# Patient Record
Sex: Female | Born: 1945 | Race: White | Hispanic: No | Smoking: Former smoker
Health system: Southern US, Community
[De-identification: ages and names within clinical notes are randomized; demographics above are authoritative.]

## PROBLEM LIST (undated history)

## (undated) DIAGNOSIS — I10 Essential (primary) hypertension: Secondary | ICD-10-CM

## (undated) DIAGNOSIS — E039 Hypothyroidism, unspecified: Secondary | ICD-10-CM

## (undated) DIAGNOSIS — M545 Low back pain, unspecified: Secondary | ICD-10-CM

## (undated) DIAGNOSIS — J189 Pneumonia, unspecified organism: Secondary | ICD-10-CM

## (undated) DIAGNOSIS — I209 Angina pectoris, unspecified: Secondary | ICD-10-CM

## (undated) DIAGNOSIS — K219 Gastro-esophageal reflux disease without esophagitis: Secondary | ICD-10-CM

## (undated) DIAGNOSIS — J449 Chronic obstructive pulmonary disease, unspecified: Secondary | ICD-10-CM

## (undated) DIAGNOSIS — G629 Polyneuropathy, unspecified: Secondary | ICD-10-CM

## (undated) DIAGNOSIS — M1990 Unspecified osteoarthritis, unspecified site: Secondary | ICD-10-CM

## (undated) DIAGNOSIS — I509 Heart failure, unspecified: Secondary | ICD-10-CM

## (undated) DIAGNOSIS — T7840XA Allergy, unspecified, initial encounter: Secondary | ICD-10-CM

## (undated) DIAGNOSIS — D649 Anemia, unspecified: Secondary | ICD-10-CM

## (undated) DIAGNOSIS — I447 Left bundle-branch block, unspecified: Secondary | ICD-10-CM

## (undated) DIAGNOSIS — R519 Headache, unspecified: Secondary | ICD-10-CM

## (undated) DIAGNOSIS — N200 Calculus of kidney: Secondary | ICD-10-CM

## (undated) DIAGNOSIS — M6282 Rhabdomyolysis: Secondary | ICD-10-CM

## (undated) DIAGNOSIS — E785 Hyperlipidemia, unspecified: Secondary | ICD-10-CM

## (undated) DIAGNOSIS — I219 Acute myocardial infarction, unspecified: Secondary | ICD-10-CM

## (undated) DIAGNOSIS — Z87442 Personal history of urinary calculi: Secondary | ICD-10-CM

## (undated) HISTORY — PX: TUBAL LIGATION: SHX77

## (undated) HISTORY — PX: CHOLECYSTECTOMY: SHX55

## (undated) HISTORY — DX: Hyperlipidemia, unspecified: E78.5

## (undated) HISTORY — DX: Chronic obstructive pulmonary disease, unspecified: J44.9

## (undated) HISTORY — PX: COLONOSCOPY: SHX174

## (undated) HISTORY — DX: Low back pain: M54.5

## (undated) HISTORY — PX: ABDOMINAL HYSTERECTOMY: SHX81

## (undated) HISTORY — PX: POLYPECTOMY: SHX149

## (undated) HISTORY — DX: Essential (primary) hypertension: I10

## (undated) HISTORY — DX: Polyneuropathy, unspecified: G62.9

## (undated) HISTORY — DX: Allergy, unspecified, initial encounter: T78.40XA

## (undated) HISTORY — DX: Low back pain, unspecified: M54.50

---

## 2003-04-02 ENCOUNTER — Ambulatory Visit (HOSPITAL_COMMUNITY): Admission: RE | Admit: 2003-04-02 | Discharge: 2003-04-02 | Payer: Self-pay | Admitting: Orthopaedic Surgery

## 2004-02-24 ENCOUNTER — Ambulatory Visit: Payer: Self-pay | Admitting: Orthopedic Surgery

## 2004-09-11 ENCOUNTER — Ambulatory Visit: Payer: Self-pay | Admitting: Physician Assistant

## 2004-09-20 ENCOUNTER — Ambulatory Visit: Payer: Self-pay | Admitting: Unknown Physician Specialty

## 2005-11-14 ENCOUNTER — Ambulatory Visit: Payer: Self-pay | Admitting: Internal Medicine

## 2006-02-12 ENCOUNTER — Encounter: Admission: RE | Admit: 2006-02-12 | Discharge: 2006-03-27 | Payer: Self-pay | Admitting: Neurology

## 2006-03-12 ENCOUNTER — Encounter: Payer: Self-pay | Admitting: Internal Medicine

## 2006-03-12 DIAGNOSIS — E118 Type 2 diabetes mellitus with unspecified complications: Secondary | ICD-10-CM

## 2006-03-12 DIAGNOSIS — M545 Low back pain: Secondary | ICD-10-CM

## 2006-03-12 DIAGNOSIS — M109 Gout, unspecified: Secondary | ICD-10-CM | POA: Insufficient documentation

## 2006-03-12 DIAGNOSIS — G609 Hereditary and idiopathic neuropathy, unspecified: Secondary | ICD-10-CM

## 2006-03-12 DIAGNOSIS — I10 Essential (primary) hypertension: Secondary | ICD-10-CM

## 2006-03-12 DIAGNOSIS — E785 Hyperlipidemia, unspecified: Secondary | ICD-10-CM

## 2006-03-12 DIAGNOSIS — J309 Allergic rhinitis, unspecified: Secondary | ICD-10-CM

## 2009-11-10 ENCOUNTER — Ambulatory Visit: Payer: Self-pay | Admitting: Family Medicine

## 2009-11-10 DIAGNOSIS — E039 Hypothyroidism, unspecified: Secondary | ICD-10-CM | POA: Insufficient documentation

## 2009-11-10 DIAGNOSIS — J449 Chronic obstructive pulmonary disease, unspecified: Secondary | ICD-10-CM

## 2009-11-10 LAB — HM MAMMOGRAPHY

## 2009-11-10 LAB — HM COLONOSCOPY

## 2009-11-14 ENCOUNTER — Ambulatory Visit: Payer: Self-pay | Admitting: General Practice

## 2009-11-15 ENCOUNTER — Ambulatory Visit: Payer: Self-pay | Admitting: Family Medicine

## 2009-11-15 DIAGNOSIS — J019 Acute sinusitis, unspecified: Secondary | ICD-10-CM | POA: Insufficient documentation

## 2009-11-16 ENCOUNTER — Encounter: Payer: Self-pay | Admitting: Family Medicine

## 2009-11-16 LAB — CONVERTED CEMR LAB
CO2: 26 meq/L (ref 19–32)
Calcium: 9.3 mg/dL (ref 8.4–10.5)
Cholesterol: 211 mg/dL — ABNORMAL HIGH (ref 0–200)
Creatinine, Ser: 0.9 mg/dL (ref 0.4–1.2)
HDL: 46.9 mg/dL (ref >39.00)
TSH: 0.47 microintl units/mL (ref 0.35–5.50)
Total CHOL/HDL Ratio: 4
Triglycerides: 215 mg/dL — ABNORMAL HIGH (ref 0.0–149.0)
Uric Acid, Serum: 5.3 mg/dL (ref 2.4–7.0)
VLDL: 43 mg/dL — ABNORMAL HIGH (ref 0.0–40.0)

## 2009-12-02 ENCOUNTER — Telehealth: Payer: Self-pay | Admitting: Family Medicine

## 2009-12-08 ENCOUNTER — Ambulatory Visit: Payer: Self-pay | Admitting: Family Medicine

## 2009-12-08 DIAGNOSIS — J029 Acute pharyngitis, unspecified: Secondary | ICD-10-CM | POA: Insufficient documentation

## 2009-12-08 DIAGNOSIS — F4321 Adjustment disorder with depressed mood: Secondary | ICD-10-CM | POA: Insufficient documentation

## 2009-12-12 ENCOUNTER — Telehealth: Payer: Self-pay | Admitting: Family Medicine

## 2009-12-15 ENCOUNTER — Ambulatory Visit: Payer: Self-pay | Admitting: Family Medicine

## 2009-12-15 DIAGNOSIS — R300 Dysuria: Secondary | ICD-10-CM | POA: Insufficient documentation

## 2009-12-15 LAB — CONVERTED CEMR LAB
Blood in Urine, dipstick: NEGATIVE
Glucose, Urine, Semiquant: NEGATIVE
WBC Urine, dipstick: NEGATIVE

## 2009-12-26 ENCOUNTER — Encounter: Payer: Self-pay | Admitting: Family Medicine

## 2009-12-26 ENCOUNTER — Encounter: Payer: Self-pay | Admitting: Cardiovascular Disease

## 2009-12-26 ENCOUNTER — Ambulatory Visit: Payer: Self-pay | Admitting: Family Medicine

## 2009-12-26 DIAGNOSIS — K219 Gastro-esophageal reflux disease without esophagitis: Secondary | ICD-10-CM | POA: Insufficient documentation

## 2009-12-26 DIAGNOSIS — I447 Left bundle-branch block, unspecified: Secondary | ICD-10-CM

## 2010-01-02 ENCOUNTER — Telehealth: Payer: Self-pay | Admitting: Family Medicine

## 2010-01-11 ENCOUNTER — Ambulatory Visit: Payer: Self-pay | Admitting: Cardiovascular Disease

## 2010-01-24 ENCOUNTER — Encounter: Payer: Self-pay | Admitting: Cardiovascular Disease

## 2010-01-24 ENCOUNTER — Ambulatory Visit: Payer: Self-pay

## 2010-02-02 ENCOUNTER — Telehealth: Payer: Self-pay | Admitting: Family Medicine

## 2010-02-08 ENCOUNTER — Encounter: Payer: Self-pay | Admitting: Family Medicine

## 2010-02-08 ENCOUNTER — Ambulatory Visit
Admission: RE | Admit: 2010-02-08 | Discharge: 2010-02-08 | Payer: Self-pay | Source: Home / Self Care | Attending: Family Medicine | Admitting: Family Medicine

## 2010-02-09 ENCOUNTER — Ambulatory Visit
Admission: RE | Admit: 2010-02-09 | Discharge: 2010-02-09 | Payer: Self-pay | Source: Home / Self Care | Attending: Family Medicine | Admitting: Family Medicine

## 2010-02-09 DIAGNOSIS — I252 Old myocardial infarction: Secondary | ICD-10-CM | POA: Insufficient documentation

## 2010-02-27 ENCOUNTER — Telehealth: Payer: Self-pay | Admitting: Family Medicine

## 2010-02-28 ENCOUNTER — Telehealth: Payer: Self-pay | Admitting: Family Medicine

## 2010-03-07 ENCOUNTER — Telehealth: Payer: Self-pay | Admitting: Family Medicine
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
## 2010-03-07 NOTE — Progress Notes (Signed)
Summary: refill requests from [facility]  Phone Note Refill Request Message from:  Fax from Pharmacy  Refills Requested: Medication #1:  PATADAY 0.2 % SOLN one drop into each eye daily   Last Refilled: 11/01/2009  Medication #2:  TOPAMAX 50 MG TABS two times a day   Last Refilled: 12/01/2009  Medication #3:  HYDROCODONE-ACETAMINOPHEN 5-500 MG TABS 1 tab by mouth every 6 hours as needed for pain   Last Refilled: 11/10/2009 Faxed requests from [facility].  Pt is asking for a new script for vicodin to be sent in.  Wants less tylenol.  Initial call taken by: [name] CMA, AAMA,  December 02, 2009 9:26 AM    New/Updated Medications: HYDROCODONE-ACETAMINOPHEN 5-325 MG TABS (HYDROCODONE-ACETAMINOPHEN) 1-2 tab by mouth every 6 hours as needed for pain. Prescriptions: TOPAMAX 50 MG TABS (TOPIRAMATE) two times a day  #60 x 3   Entered and Authorized by:   [name] MD   Signed by:   [name] MD on 12/02/2009   Method used:   Electronically to        [facility]* (retail)       [address]       [place], [place]  [address]       Ph: [phone]       Fax: [phone]   RxID:   [id number] PATADAY 0.2 % SOLN (OLOPATADINE HCL) one drop into each eye daily  #1 x 3   Entered and Authorized by:   [name] MD   Signed by:   [name] MD on 12/02/2009   Method used:   Electronically to        [facility]* (retail)       [address]       [place], [place]  [address]       Ph: [phone]       Fax: [phone]   RxID:   [id number]

## 2010-03-07 NOTE — Letter (Signed)
Summary: Controlled Substances Contract  [place] at [facility]  [address] [place], [place] [address]   Phone: [phone]  Fax: [phone]    [facility] Controlled Substances Contract         Patient Name: Mariah Reed Patient DOB: 04-04-1945        Patient MRN:  [id number]        Physician's Name: _________________________________________   Patients must complete this contract before doctors at the [facility] office will be willing to prescribe controlled substances. I understand that: ___1)  I am responsible for my controlled substance medications.  If my prescription is lost, misplaced or stolen, or if I take more than prescribed, my doctor will not write me a new prescription. ___2)  I will not request or accept controlled substances or controlled substance prescriptions from any other doctor or clinic while I am receiving controlled substance treatment at [facility].  The ONLY exception is if controlled substances are prescribed for the treatment of an acute condition that is NOT the diagnosis for which I am receiving treatment at [facility].   I will call my physician at [facility] if I receive controlled substance or controlled substance prescriptions from anywhere else. ___3)  Controlled substance refills will be made ONLY during regular office hours. ___4)  Refills will not be made if I run out early.  Refills will not be made during work-in or urgent care visits.  Refills will NOT be made for "emergencies", such as on a Friday afternoon or by on call service at night or weekends.  I understand that I am required to call at least 2 business days prior to expiration date for controlled substance and/or needing controlled substance refills.   ___5)  I will not use illicit (illegal) drugs.  ___6)  I agree to take urine or blood drug tests when requested for routine screening. ___7)  I agree to use only ONE pharmacy for  filling ALL my controlled substance prescriptions.             Name and Location of Pharmacy:                                                                                                                  ___8)  I understand that my doctor may review my use of controlled substances using the [facility] Controlled Substance Reporting System. ___9)  If I behave in an abusive way towards [facility] staff, my controlled substance prescriptions may be stopped, and I may be dismissed from this practice. ___10)  I understand that if I break any of the above terms of this contract, my pain prescription and/or treatment may be stopped immediately.  If I get controlled substances from someone else or use illegal drugs, I may be reported to all my doctors, medical facilities and appropriate authorities.  I have been fully informed by [facility] physicians and the staff regarding psychological dependence (addiction)  to controlled substances.  I understand that I should stop my medication ONLY under medical supervision or I may have withdrawal symptoms.  ***I have read this contract and it has been explained to me by [facility] physicians and/or their staff, and I fully understand the consequences of violating any of the terms of this contract.    Patient Signature _________________________________________ Date November 10, 2009   [facility] Staff Signature ____________________________________ Date November 10, 2009

## 2010-03-07 NOTE — Letter (Signed)
Summary: Generic Letter  [place] at [facility]  [address] [place], [place] [address]   Phone: [phone]  Fax: [phone]    11/16/2009  [facility] [address] [place], [place]  [address]  Dear Mariah Reed,    We have been unable to reach you by telephone regarding your lab results.  Please call our office at your convenience to discuss these results.  When calling please ask to speak with Lowella Bandy, Medical Assistant for Dr. [name].       Sincerely,         Linde Gillis CMA (AAMA)for Dr. [name]

## 2010-03-07 NOTE — Progress Notes (Signed)
Summary: refill request for lyrica  Phone Note Refill Request Message from:  Fax from Pharmacy  Refills Requested: Medication #1:  LYRICA 100 MG CAPS three times a day   Last Refilled: 12/01/2009 Faxed request from [facility].  Initial call taken by: [name] CMA, AAMA,  December 02, 2009 2:14 PM

## 2010-03-07 NOTE — Miscellaneous (Signed)
Summary: Controlled Substances Contract  Controlled Substances Contract   Imported By: Maryln Gottron 11/25/2009 13:56:48  _____________________________________________________________________  External Attachment:    Type:   Image     Comment:   External Document

## 2010-03-07 NOTE — Assessment & Plan Note (Signed)
Summary: sinus infection/alc   Vital Signs:  Patient profile:   65 year old female Weight:      220 pounds Temp:     98.3 degrees F oral Pulse rate:   88 / minute Pulse rhythm:   regular BP sitting:   124 / 80  (right arm) Cuff size:   large  Vitals Entered By: Selena Batten Dance CMA Duncan Dull) (November 15, 2009 11:26 AM) CC: Sinus infection   CC:  Sinus infection.  History of Present Illness: CC: sinus infection  6d h/o sinus congestion and pressure/headache, coughing, + purulent drainage.  has actually had several weeks of sinus issues, but initially attributed to allergies (pt with h/o bad allergies).  No ear pain, tooth pain.  No fevers.  No abd pain, n/v/d.  no sick contacts.  no smoking currently.  h/o COPD.  No increase in SOB.  No vision changes  recent kidney infection, h/o kidney stones - completed course of cipro x 10 days per Dr. Glenis Smoker.  Saw Dr. Corbin Ade (sp?) [facility] yesterday for above, told urine clear.    While on cipro HA and sinus pain improved, then when finished course HA returning so had old amoxicillin at home, took 1 pill which resolved headache.  + h/o sinus infections in past.  Current Medications (verified): 1)  Lyrica 100 Mg Caps (Pregabalin) .... Three Times A Day 2)  Ambien 10 Mg Tabs (Zolpidem Tartrate) .... At Bedtime 3)  Prilosec 20 Mg Cpdr (Omeprazole) .... Once Daily 4)  Topamax 50 Mg Tabs (Topiramate) .... Two Times A Day 5)  Lasix 20 Mg Tabs (Furosemide) .... Once Daily 6)  Premarin 0.3 Mg Tabs (Estrogens Conjugated) .... Once Daily 7)  Hydrocodone-Acetaminophen 5-500 Mg Tabs (Hydrocodone-Acetaminophen) .Marland Kitchen.. 1 Tab By Mouth Every 6 Hours As Needed For Pain 8)  Prodigy Twist Top Lancets 28g  Misc (Lancets) .... Use One Lancet Each Day As Directed 9)  Voltaren 1 % Gel (Diclofenac Sodium) .... Apply Four Grams Four Times Daily As Directed 10)  Proair Hfa 108 (90 Base) Mcg/act Aers (Albuterol Sulfate) .... Inhale Two Puffs Four Times Daily As  Directed 11)  Spiriva Handihaler 18 Mcg Caps (Tiotropium Bromide Monohydrate) .... Inhale One Capsule With Inhalation Device Each Day 12)  Lovastatin 40 Mg Tabs (Lovastatin) .... Take One Tablet By Mouth At Bedtime 13)  Glipizide 5 Mg Tabs (Glipizide) .... Take One Tablet By Mouth in The Morning and Then 1/2 Tablet By Mouth in The Afternoon Each Day 14)  Levothyroxine Sodium 75 Mcg Tabs (Levothyroxine Sodium) .... Take One Tablet By Mouth Daily 15)  Fluticasone Propionate 50 Mcg/act Susp (Fluticasone Propionate) .... Inhale Two Sprays Each Nostril Once Daily 16)  Prodigy No Coding Blood Gluc  Strp (Glucose Blood) .... Use To Test Blood Sugar Three Times Daily 17)  Quinapril Hcl 20 Mg Tabs (Quinapril Hcl) .... Take One Tablet By Mouth Daily 18)  Nortriptyline Hcl 50 Mg Caps (Nortriptyline Hcl) .... Take One Tablet By Mouth Every Night At Bedtime 19)  Pataday 0.2 % Soln (Olopatadine Hcl) .... One Drop Into Each Eye Daily  Allergies (verified): No Known Drug Allergies  Past History:  Social History: Last updated: 11/10/2009 Married to [employer]. Disabled.  Past Medical History: Allergic rhinitis Diabetes mellitus, type II Gout Hyperlipidemia Hypertension Low back pain Peripheral neuropathy COPD  Review of Systems       per HPI  Physical Exam  General:  alert and overweight-appearing, no acute distress.   Head:  normocephalic, atraumatic,  and no abnormalities observed.  mild discomfort to palpation R frontal and maxillary sinuses.  no temporal tenderness Eyes:  vision grossly intact, pupils equal, pupils round, and pupils reactive to light.  + puffy lower eyelids Ears:  R ear normal and L ear normal.   Nose:  no external deformity.  dry turbinates Mouth:  pharynx pink and moist.  MMM Neck:  No deformities, masses, or tenderness noted. Lungs:  Normal respiratory effort, chest expands symmetrically. Lungs are clear to auscultation, no crackles or wheezes. Heart:  Normal rate  and regular rhythm. S1 and S2 normal without gallop, murmur, click, rub or other extra sounds.   Impression & Recommendations:  Problem # 1:  SINUSITIS, ACUTE (ICD-461.9) given going on for sevral weeks, treat as sinusitis with curse of augmentin.  complicated given recent abx use (cipro, one pill of amoxicillin).  red flags to return discussed.  supportive care as in pt instructions.  The following medications were removed from the medication list:    Cipro 500 Mg Tabs (Ciprofloxacin hcl) .Marland Kitchen... Take one tablet by mouth two times a day Her updated medication list for this problem includes:    Fluticasone Propionate 50 Mcg/act Susp (Fluticasone propionate) ..... Inhale two sprays each nostril once daily    Augmentin 875-125 Mg Tabs (Amoxicillin-pot clavulanate) .Marland Kitchen... Twice daily for 10 days  Complete Medication List: 1)  Lyrica 100 Mg Caps (Pregabalin) .... Three times a day 2)  Ambien 10 Mg Tabs (Zolpidem tartrate) .... At bedtime 3)  Prilosec 20 Mg Cpdr (Omeprazole) .... Once daily 4)  Topamax 50 Mg Tabs (Topiramate) .... Two times a day 5)  Lasix 20 Mg Tabs (Furosemide) .... Once daily 6)  Premarin 0.3 Mg Tabs (Estrogens conjugated) .... Once daily 7)  Hydrocodone-acetaminophen 5-500 Mg Tabs (Hydrocodone-acetaminophen) .Marland Kitchen.. 1 tab by mouth every 6 hours as needed for pain 8)  Prodigy Twist Top Lancets 28g Misc (Lancets) .... Use one lancet each day as directed 9)  Voltaren 1 % Gel (Diclofenac sodium) .... Apply four grams four times daily as directed 10)  Proair Hfa 108 (90 Base) Mcg/act Aers (Albuterol sulfate) .... Inhale two puffs four times daily as directed 11)  Spiriva Handihaler 18 Mcg Caps (Tiotropium bromide monohydrate) .... Inhale one capsule with inhalation device each day 12)  Lovastatin 40 Mg Tabs (Lovastatin) .... Take one tablet by mouth at bedtime 13)  Glipizide 5 Mg Tabs (Glipizide) .... Take one tablet by mouth in the morning and then 1/2 tablet by mouth in the  afternoon each day 14)  Levothyroxine Sodium 75 Mcg Tabs (Levothyroxine sodium) .... Take one tablet by mouth daily 15)  Fluticasone Propionate 50 Mcg/act Susp (Fluticasone propionate) .... Inhale two sprays each nostril once daily 16)  Prodigy No Coding Blood Gluc Strp (Glucose blood) .... Use to test blood sugar three times daily 17)  Quinapril Hcl 20 Mg Tabs (Quinapril hcl) .... Take one tablet by mouth daily 18)  Nortriptyline Hcl 50 Mg Caps (Nortriptyline hcl) .... Take one tablet by mouth every night at bedtime 19)  Pataday 0.2 % Soln (Olopatadine hcl) .... One drop into each eye daily 20)  Augmentin 875-125 Mg Tabs (Amoxicillin-pot clavulanate) .... Twice daily for 10 days  Patient Instructions: 1)  You have a sinus infection. 2)  Take medicines as prescribed:  augmentin twice daily for 10 days. 3)  Take guaifenesin 400mg  IR 1 1/2 pills in am and at noon with plenty of fluid to help mobilize mucous. 4)  Use nasal  saline spray or neti pot to help drainage of sinuses. 5)  If you start having fevers >101.5, trouble swallowing or breathing, or are worsening instead of improving as expected, you may need to be seen again. 6)  Good to see you today, call clinic with questions.  Prescriptions: AUGMENTIN 875-125 MG TABS (AMOXICILLIN-POT CLAVULANATE) twice daily for 10 days  #20 x 0   Entered and Authorized by:   Eustaquio Boyden  MD   Signed by:   Eustaquio Boyden  MD on 11/15/2009   Method used:   Electronically to        [facility]* (retail)       [address]       [place], [place]  [address]       Ph: [phone]       Fax: [phone]   RxID:   [id number]   Current Allergies (reviewed today): No known allergies

## 2010-03-07 NOTE — Progress Notes (Signed)
Summary: refill request for lyrica  Phone Note Refill Request Message from:  Fax from Pharmacy  Refills Requested: Medication #1:  LYRICA 100 MG CAPS three times a day   Last Refilled: 12/01/2009 Faxed request from [facility].  Initial call taken by: [name] CMA, AAMA,  December 02, 2009 2:14 PM  Follow-up for Phone Call        Rx called to pharmacy Follow-up by: Linde Gillis CMA Duncan Dull),  December 02, 2009 3:00 PM    Prescriptions: LYRICA 100 MG CAPS (PREGABALIN) three times a day  #90 x 3   Entered and Authorized by:   [name] MD   Signed by:   [name] MD on 12/02/2009   Method used:   Telephoned to ...       [facility]* (retail)       [address]       [place], [place]  [address]       Ph: [phone]       Fax: [phone]   RxID:   [id number]

## 2010-03-07 NOTE — Assessment & Plan Note (Signed)
Summary: SORE THROAT   Vital Signs:  Patient profile:   65 year old female Height:      66 inches Weight:      218 pounds BMI:     35.31 Temp:     98.3 degrees F oral Pulse rate:   84 / minute Pulse rhythm:   regular BP sitting:   126 / 82  (left arm) Cuff size:   large  Vitals Entered By: Linde Gillis CMA Duncan Dull) (December 08, 2009 10:54 AM) CC: sore throat   History of Present Illness: 65 yo here for sore throat but wants to discuss multiple medical problems.    1.  Sore throat- started 3 days ago.  Treated with Augmentin on 10/11.  Denies any feves or chills.  Her father died recently and was around a lot of people that had colds. Allegra D helps a little bit.   No runny nose or sinus pressure.  2.  HTN- wants to stop taking Quinipril.  Has been taking it for years, wants to try something else because she thinks it makes her feel run down and gives her headaches. No CP, SOB or blurred vision and BP very well controlled on current medicaiton.  3.  Fatigue- since her father died and she was treated for URI with Augmentin a few weeks ago, feels very fatigued.  No SOB or DOE, just drained.  Ok with dad passing, he was 107 but it is still sad.  No anxiety, not that tearful.  No SI or HI.  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    Current Medications (verified): 1)  Lyrica 100 Mg Caps (Pregabalin) .... Three Times A Day 2)  Ambien 10 Mg Tabs (Zolpidem Tartrate) .... At Bedtime 3)  Prilosec 20 Mg Cpdr (Omeprazole) .... Once Daily 4)  Topamax 50 Mg Tabs (Topiramate) .... Two Times A Day 5)  Lasix 20 Mg Tabs (Furosemide) .... Once Daily 6)  Premarin 0.3 Mg Tabs (Estrogens Conjugated) .... Once Daily 7)  Hydrocodone-Acetaminophen 5-500 Mg Tabs (Hydrocodone-Acetaminophen) .Marland Kitchen.. 1 Tab By Mouth Every 6 Hours As Needed For Pain 8)  Prodigy Twist Top Lancets 28g  Misc (Lancets) .... Use One Lancet Each Day As Directed 9)  Voltaren 1 % Gel (Diclofenac Sodium) .... Apply Four Grams  Four Times Daily As Directed 10)  Proair Hfa 108 (90 Base) Mcg/act Aers (Albuterol Sulfate) .... Inhale Two Puffs Four Times Daily As Directed 11)  Spiriva Handihaler 18 Mcg Caps (Tiotropium Bromide Monohydrate) .... Inhale One Capsule With Inhalation Device Each Day 12)  Lovastatin 40 Mg Tabs (Lovastatin) .... Take One Tablet By Mouth At Bedtime 13)  Glipizide 5 Mg Tabs (Glipizide) .... Take One Tablet By Mouth in The Morning and Then 1/2 Tablet By Mouth in The Afternoon Each Day 14)  Levothyroxine Sodium 75 Mcg Tabs (Levothyroxine Sodium) .... Take One Tablet By Mouth Daily 15)  Fluticasone Propionate 50 Mcg/act Susp (Fluticasone Propionate) .... Inhale Two Sprays Each Nostril Once Daily 16)  Prodigy No Coding Blood Gluc  Strp (Glucose Blood) .... Use To Test Blood Sugar Three Times Daily 17)  Nortriptyline Hcl 50 Mg Caps (Nortriptyline Hcl) .... Take One Tablet By Mouth Every Night At Bedtime 18)  Pataday 0.2 % Soln (Olopatadine Hcl) .... One Drop Into Each Eye Daily 19)  Augmentin 875-125 Mg Tabs (Amoxicillin-Pot Clavulanate) .... Twice Daily For 10 Days 20)  Hydrocodone-Acetaminophen 5-325 Mg Tabs (Hydrocodone-Acetaminophen) .Marland Kitchen.. 1-2 Tab By Mouth Every 6 Hours As Needed For  Pain. 21)  Hydrochlorothiazide 25 Mg  Tabs (Hydrochlorothiazide) .... Take 1 Tab By Mouth Every Morning  Allergies (verified): No Known Drug Allergies  Past History:  Past Medical History: Last updated: 11/15/2009 Allergic rhinitis Diabetes mellitus, type II Gout Hyperlipidemia Hypertension Low back pain Peripheral neuropathy COPD  Past Surgical History: Last updated: 03/12/2006 Colon polypectomy Hysterectomy Tubal ligation  Family History: Last updated: 11/10/2009 Family History Uterine cancer- mom died at 83 yo  Social History: Last updated: 11/10/2009 Married to [employer]. Disabled.  Review of Systems      See HPI General:  Denies fever. ENT:  Denies nasal congestion. CV:  Denies chest  pain or discomfort. Resp:  Denies cough. GI:  Denies change in bowel habits. Neuro:  Denies memory loss, numbness, and visual disturbances. Psych:  Denies sense of great danger, suicidal thoughts/plans, thoughts of violence, unusual visions or sounds, and thoughts /plans of harming others.  Physical Exam  General:  alert and overweight-appearing, no acute distress.   Head:  normocephalic, atraumatic, and no abnormalities observed.  mild discomfort to palpation R frontal and maxillary sinuses.  no temporal tenderness Eyes:  vision grossly intact, pupils equal, pupils round, and pupils reactive to light.  Ears:  R ear normal and L ear normal.   Nose:  no external deformity.  dry turbinates Mouth:  pharyngeal erythema.   no exudate. Lungs:  Normal respiratory effort, chest expands symmetrically. Lungs are clear to auscultation, no crackles or wheezes. Heart:  Normal rate and regular rhythm. S1 and S2 normal without gallop, murmur, click, rub or other extra sounds. Psych:  Oriented X3 and good eye contact.     Impression & Recommendations:  Problem # 1:  GRIEF REACTION, ACUTE (ICD-309.0) Assessment New Appropriate response.  Discussed grief counselling which she does not feel is necessary at this time. She knows it is normal to feel tired afterwards and will let me know how she is feeling over next several weeks.  Problem # 2:  ACUTE PHARYNGITIS (ICD-462) Assessment: New Likely combination PND from allergic rhinitis and viral pharyngitis. Rapid strep negative.   Advised supportive care with salt water gargles, allegra. Her updated medication list for this problem includes:    Augmentin 875-125 Mg Tabs (Amoxicillin-pot clavulanate) .Marland Kitchen... Twice daily for 10 days  Orders: Rapid Strep ([address])  Problem # 3:  HYPERTENSION (ICD-401.9) Assessment: Unchanged Stongly encouraged her not to change medications since she has been so well controlled but she insists on trying something  else. Will D/C Quinipril and start HCTZ.   The following medications were removed from the medication list:    Lasix 20 Mg Tabs (Furosemide) ..... Once daily    Quinapril Hcl 20 Mg Tabs (Quinapril hcl) .Marland Kitchen... Take one tablet by mouth daily Her updated medication list for this problem includes:    Hydrochlorothiazide 25 Mg Tabs (Hydrochlorothiazide) .Marland Kitchen... Take 1 tab by mouth every morning  Orders: Prescription Created Electronically [phone]  Complete Medication List: 1)  Lyrica 100 Mg Caps (Pregabalin) .... Three times a day 2)  Ambien 10 Mg Tabs (Zolpidem tartrate) .... At bedtime 3)  Prilosec 20 Mg Cpdr (Omeprazole) .... Once daily 4)  Topamax 50 Mg Tabs (Topiramate) .... Two times a day 5)  Premarin 0.3 Mg Tabs (Estrogens conjugated) .... Once daily 6)  Hydrocodone-acetaminophen 5-500 Mg Tabs (Hydrocodone-acetaminophen) .Marland Kitchen.. 1 tab by mouth every 6 hours as needed for pain 7)  Prodigy Twist Top Lancets 28g Misc (Lancets) .... Use one lancet each day as directed 8)  Voltaren 1 % Gel (Diclofenac sodium) .... Apply four grams four times daily as directed 9)  Proair Hfa 108 (90 Base) Mcg/act Aers (Albuterol sulfate) .... Inhale two puffs four times daily as directed 10)  Spiriva Handihaler 18 Mcg Caps (Tiotropium bromide monohydrate) .... Inhale one capsule with inhalation device each day 11)  Lovastatin 40 Mg Tabs (Lovastatin) .... Take one tablet by mouth at bedtime 12)  Glipizide 5 Mg Tabs (Glipizide) .... Take one tablet by mouth in the morning and then 1/2 tablet by mouth in the afternoon each day 13)  Levothyroxine Sodium 75 Mcg Tabs (Levothyroxine sodium) .... Take one tablet by mouth daily 14)  Fluticasone Propionate 50 Mcg/act Susp (Fluticasone propionate) .... Inhale two sprays each nostril once daily 15)  Prodigy No Coding Blood Gluc Strp (Glucose blood) .... Use to test blood sugar three times daily 16)  Nortriptyline Hcl 50 Mg Caps (Nortriptyline hcl) .... Take one tablet by mouth  every night at bedtime 17)  Pataday 0.2 % Soln (Olopatadine hcl) .... One drop into each eye daily 18)  Augmentin 875-125 Mg Tabs (Amoxicillin-pot clavulanate) .... Twice daily for 10 days 19)  Hydrocodone-acetaminophen 5-325 Mg Tabs (Hydrocodone-acetaminophen) .Marland Kitchen.. 1-2 tab by mouth every 6 hours as needed for pain. 20)  Hydrochlorothiazide 25 Mg Tabs (Hydrochlorothiazide) .... Take 1 tab by mouth every morning Prescriptions: NORTRIPTYLINE HCL 50 MG CAPS (NORTRIPTYLINE HCL) take one tablet by mouth every night at bedtime  #30 x 0   Entered and Authorized by:   [name] MD   Signed by:   [name] MD on 12/08/2009   Method used:   Electronically to        [facility]* (retail)       [address]       [place], [place]  [address]       Ph: [phone]       Fax: [phone]   RxID:   [id number] HYDROCHLOROTHIAZIDE 25 MG  TABS (HYDROCHLOROTHIAZIDE) Take 1 tab by mouth every morning  #90 x 3   Entered and Authorized by:   [name] MD   Signed by:   [name] MD on 12/08/2009   Method used:   Electronically to        [facility]* (retail)       [address]       [place], [place]  [address]       Ph: [phone]       Fax: [phone]   RxID:   [id number]    Orders Added: 1)  Prescription Created Electronically [phone] 2)  New Patient Level IV [[address]] 3)  Rapid Strep [[address]]    Current Allergies (reviewed today): No known allergies   Laboratory Results    Other Tests  Rapid Strep: negative  Kit Test Internal QC: Positive   (Normal Range: Negative)   Appended Document: SORE THROAT Realized after I sent in HCTZ that she has h/o gout.  Called pt and left message that HCTZ can worsen gout and to please call us back to discuss.

## 2010-03-07 NOTE — Progress Notes (Signed)
Summary: BP elevated  Phone Note Call from Patient Call back at Home Phone [phone]   Caller: Patient Call For: [name] MD Summary of Call: Patient says that she stopped taking the Quinipril, and started the HCTZ. She said that ever since then her BP has been elevated. Her BP today was 134/99 and her pulse is 102. She says that she constantly feels clammy. She is asking if she can try something else. Uses [facility].  Initial call taken by: Melody Comas,  December 12, 2009 1:58 PM  Follow-up for Phone Call        please have her stop the HCTZ and go back on the quinipril.  I'm not comfortable placing her on anything else due to her other medical conditions. [name] MD  December 12, 2009 1:59 PM  Patient advised as instructed via telephone.  She says that she does not have anymore Quinipril because she threw all of it away when she came home from the hospital.  She wants to know if she can try another BP medication because she has been having migraines while on Quinipril.  She says that since she stopped taking it she hasn't had many.  I advised her as instructed about you  not wanting to change her BP medications due to her other medical conditions but she insist that Dr. Dayton Martes give her something else for BP.  Please advise.  Linde Gillis CMA Duncan Dull)  December 12, 2009 2:28 PM    Additional Follow-up for Phone Call Additional follow up Details #1::        We could either try another ACE inhibitor, like Lisinopril or amlodipine (norvasc).  That is truly all I feel comfortable trying. [name] MD  December 12, 2009 2:29 PM  Left message on cell phone voicemail for patient to return call.  Linde Gillis CMA Duncan Dull)  December 12, 2009 2:41 PM   Spoke with patient and she agrees to try Lisinopril or Norvasc.  Please sent Rx to [facility].  Linde Gillis CMA Duncan Dull)  December 12, 2009 2:48 PM      New/Updated Medications: LISINOPRIL 20 MG TABS (LISINOPRIL) 1 tablet by mouth  daily Prescriptions: LISINOPRIL 20 MG TABS (LISINOPRIL) 1 tablet by mouth daily  #90 x 3   Entered and Authorized by:   [name] MD   Signed by:   [name] MD on 12/12/2009   Method used:   Electronically to        [facility]* (retail)       [address]       [place], [place]  [address]       Ph: [phone]       Fax: [phone]   RxID:   [id number]

## 2010-03-07 NOTE — Progress Notes (Signed)
Summary: Pt's Hx/Patient  Pt's Hx/Patient   Imported By: Sherian Rein 11/17/2009 09:08:48  _____________________________________________________________________  External Attachment:    Type:   Image     Comment:   External Document

## 2010-03-07 NOTE — Progress Notes (Signed)
Summary: Rx Hydrocodone/APAP  Phone Note Refill Request Call back at [phone] Message from:  [facility] on January 02, 2010 12:24 PM  Refills Requested: Medication #1:  HYDROCODONE-ACETAMINOPHEN 5-325 MG TABS 1-2 tab by mouth every 6 hours as needed for pain.   Last Refilled: 12/02/2009 Received faxed refill request please advise.   Method Requested: Telephone to Pharmacy Initial call taken by: Linde Gillis CMA Duncan Dull),  January 02, 2010 12:25 PM  Follow-up for Phone Call        Rx called to pharmacy Follow-up by: Linde Gillis CMA Duncan Dull),  January 02, 2010 12:37 PM    Prescriptions: HYDROCODONE-ACETAMINOPHEN 5-325 MG TABS (HYDROCODONE-ACETAMINOPHEN) 1-2 tab by mouth every 6 hours as needed for pain.  #60 x 0   Entered and Authorized by:   [name] MD   Signed by:   [name] MD on 01/02/2010   Method used:   Telephoned to ...       [facility]* (retail)       [address]       [place], [place]  [address]       Ph: [phone]       Fax: [phone]   RxID:   [id number]

## 2010-03-07 NOTE — Miscellaneous (Signed)
  Clinical Lists Changes  Medications: Added new medication of QUINAPRIL HCL 20 MG  TABS (QUINAPRIL HCL) 1 by mouth daily - Signed Rx of QUINAPRIL HCL 20 MG  TABS (QUINAPRIL HCL) 1 by mouth daily;  #30 x 6;  Signed;  Entered by: [name] MD;  Authorized by: [name] MD;  Method used: Electronically to [facility]*, [address], [place], [place]  [address], Ph: [phone], Fax: [phone]    Prescriptions: QUINAPRIL HCL 20 MG  TABS (QUINAPRIL HCL) 1 by mouth daily  #30 x 6   Entered and Authorized by:   [name] MD   Signed by:   [name] MD on 12/08/2009   Method used:   Electronically to        [facility]* (retail)       [address]       [place], [place]  [address]       Ph: [phone]       Fax: [phone]   RxID:   [id number]
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
## 2010-03-07 NOTE — Assessment & Plan Note (Signed)
Summary: POSSIBLE KIDNEY INFECTION OR UTI???   Vital Signs:  Patient profile:   65 year old female Height:      66 inches Weight:      219.75 pounds BMI:     35.60 Temp:     98.4 degrees F oral Pulse rate:   84 / minute Pulse rhythm:   regular BP sitting:   122 / 70  (left arm) Cuff size:   regular  Vitals Entered By: Delilah Shan CMA Duncan Dull) (December 15, 2009 10:18 AM) CC: Possible UTI   History of Present Illness: "I've been feeling bad and having some itching."  No FCVD.  Itching in vaginal area.  Some burning with urination on distal urethra.  Some frequency.  Some pain in lower abdominal area, over bladder.  Took AZO and had some relief.  No discharge.    Allergies: No Known Drug Allergies  Review of Systems       See HPI.  Also with sore throat over the past few weeks.  no fevers.  Taking otc allergy meds with some relief.  Otherwise negative.    Physical Exam  General:  NAD MMM TMs and nasal exam w/o acute abnormality minimal irriation in OP, no exudates.   RRR CTAB abdominal exam soft, not tender to palpation, no rebound, no CVA pain. chronic changes on L hand noted.    Impression & Recommendations:  Problem # 1:  DYSURIA (ICD-788.1) Wet prep unremarkable and she did have relief with AZO.  Will check Ucx and start septra in meantime.  follow up is symptoms continue or if oral symptoms don't improve with otc allergy meds.  She agrees.   The following medications were removed from the medication list:    Augmentin 875-125 Mg Tabs (Amoxicillin-pot clavulanate) .Marland Kitchen... Twice daily for 10 days Her updated medication list for this problem includes:    Septra Ds 800-160 Mg Tabs (Sulfamethoxazole-trimethoprim) .Marland Kitchen... 1 by mouth two times a day  Orders: Specimen Handling (57846) T-Culture, Urine ([id number]) Wet Prep (24401UU) UA Dipstick w/o Micro (manual) (81002)  Complete Medication List: 1)  Lyrica 100 Mg Caps (Pregabalin) .... Three times a day 2)  Ambien  10 Mg Tabs (Zolpidem tartrate) .... At bedtime 3)  Prilosec 20 Mg Cpdr (Omeprazole) .... Once daily 4)  Topamax 50 Mg Tabs (Topiramate) .... Two times a day 5)  Premarin 0.3 Mg Tabs (Estrogens conjugated) .... Once daily 6)  Prodigy Twist Top Lancets 28g Misc (Lancets) .... Use one lancet each day as directed 7)  Voltaren 1 % Gel (Diclofenac sodium) .... Apply four grams four times daily as directed 8)  Proair Hfa 108 (90 Base) Mcg/act Aers (Albuterol sulfate) .... Inhale two puffs four times daily as directed 9)  Spiriva Handihaler 18 Mcg Caps (Tiotropium bromide monohydrate) .... Inhale one capsule with inhalation device each day 10)  Lovastatin 40 Mg Tabs (Lovastatin) .... Take one tablet by mouth at bedtime 11)  Glipizide 5 Mg Tabs (Glipizide) .... Take one tablet by mouth in the morning and then 1/2 tablet by mouth in the afternoon each day 12)  Levothyroxine Sodium 75 Mcg Tabs (Levothyroxine sodium) .... Take one tablet by mouth daily 13)  Fluticasone Propionate 50 Mcg/act Susp (Fluticasone propionate) .... Inhale two sprays each nostril once daily 14)  Prodigy No Coding Blood Gluc Strp (Glucose blood) .... Use to test blood sugar three times daily 15)  Nortriptyline Hcl 50 Mg Caps (Nortriptyline hcl) .... Take one tablet by mouth every night at bedtime  16)  Pataday 0.2 % Soln (Olopatadine hcl) .... One drop into each eye daily 17)  Hydrocodone-acetaminophen 5-325 Mg Tabs (Hydrocodone-acetaminophen) .Marland Kitchen.. 1-2 tab by mouth every 6 hours as needed for pain. 18)  Furosemide 20 Mg Tabs (Furosemide) .Marland Kitchen.. 1 tab by mouth daily. 19)  Lisinopril 20 Mg Tabs (Lisinopril) .Marland Kitchen.. 1 tablet by mouth daily 20)  Black Cohosh Root Powd (Black cohosh root) .... Once daily 21)  Benadryl 25 Mg Tabs (Diphenhydramine hcl) .... As needed 22)  Allegra Allergy 180 Mg Tabs (Fexofenadine hcl) .... As needed 23)  Septra Ds 800-160 Mg Tabs (Sulfamethoxazole-trimethoprim) .Marland Kitchen.. 1 by mouth two times a day  Patient  Instructions: 1)  I would star the antibiotics and let us know if you continue to have symptoms.  We'll let you know about the urine culture results.  Take care.  Prescriptions: SEPTRA DS 800-160 MG TABS (SULFAMETHOXAZOLE-TRIMETHOPRIM) 1 by mouth two times a day  #6 x 0   Entered and Authorized by:   Crawford Givens MD   Signed by:   Crawford Givens MD on 12/15/2009   Method used:   Electronically to        [facility]* (retail)       [address]       [place], [place]  [address]       Ph: [phone]       Fax: [phone]   RxID:   [id number]    Orders Added: 1)  Est. Patient Level III [84132] 2)  Specimen Handling [99000] 3)  T-Culture, Urine [[id number]] 4)  Wet Prep [66440HK] 5)  UA Dipstick w/o Micro (manual) [81002]    Current Allergies (reviewed today): No known allergies   Laboratory Results   Urine Tests  Date/Time Received: December 15, 2009 10:25 AM   Routine Urinalysis   Color: yellow Appearance: Hazy Glucose: negative   (Normal Range: Negative) Bilirubin: small   (Normal Range: Negative) Ketone: negative   (Normal Range: Negative) Spec. Gravity: >=1.030   (Normal Range: 1.003-1.035) Blood: negative   (Normal Range: Negative) pH: 6.0   (Normal Range: 5.0-8.0) Protein: trace   (Normal Range: Negative) Urobilinogen: 0.2   (Normal Range: 0-1) Nitrite: negative   (Normal Range: Negative) Leukocyte Esterace: negative   (Normal Range: Negative)      Wet [place] Source: self swab WBC/hpf: 1-5 Bacteria/hpf: 1+  Rods Clue cells/hpf: none Yeast/hpf: none Trichomonas/hpf: none

## 2010-03-07 NOTE — Progress Notes (Signed)
Summary: refill request for ambien  Phone Note Refill Request Message from:  Fax from Pharmacy  Refills Requested: Medication #1:  AMBIEN 10 MG TABS at bedtime   Last Refilled: 12/01/2009 Faxed request from [facility].  Initial call taken by: [name] CMA, AAMA,  December 02, 2009 4:14 PM  Follow-up for Phone Call        Rx called to pharmacy Follow-up by: Linde Gillis CMA Duncan Dull),  December 05, 2009 8:09 AM    Prescriptions: AMBIEN 10 MG TABS (ZOLPIDEM TARTRATE) at bedtime  #30 x 0   Entered and Authorized by:   [name] MD   Signed by:   [name] MD on 12/05/2009   Method used:   Telephoned to ...       [facility]* (retail)       [address]       [place], [place]  [address]       Ph: [phone]       Fax: [phone]   RxID:   [id number]

## 2010-03-07 NOTE — Assessment & Plan Note (Signed)
Summary: NP6/AMD   Visit Type:  Initial Consult Primary Provider:  [name] MD  CC:  c/o SOB when walking. Denies chest pain and palpitations.  History of Present Illness: Mariah Reed is a 65 yo woman with a long hx of palpitations, diabetes, neuropathy in her legs, chronic severe arthritis of her neck and lower back, long smoking hx, accident as a child with residual left hand and arm dysfunction, presenting for evaluation for labile blood pressures, palpitations, ABN ekg.  She reports palpitations dating back to her 30s. She has had two epsiodes of severe chest pain though none within the past few years. She has frequent palpitations, noted more often at night when she is trying to sleep. She also has a drop in her blood pressure with systolics to 100 at night. She is not taking lisinopril and when she does for HTN in the day, she takes 1/4 of the lisinopril 20 mg tab.  She takes [place] and antihistamines at night for sleep. She can not take ASA as she reports a hx of stomach ulcers.  She takes lasix as needed for leg swelling.  She states having renal dysfunction for drinking too many sodas back in 1994, requiring lasix. She has had no trouble since then pulmonary emboli her report.  She does not walk very far as she has bad arthritis and palpitations with SOB. Sometimes with chest pain. Legs bother her ("neuropathy").  EKGNSR with rate of 94 bpm, LBBB, APC noted  Preventive Screening-Counseling & Management  Alcohol-Tobacco     Smoking Status: never  Caffeine-Diet-Exercise     Does Patient Exercise: no      Drug Use:  no.    Current Medications (verified): 1)  Lyrica 100 Mg Caps (Pregabalin) .... Three Times A Day 2)  Ambien 10 Mg Tabs (Zolpidem Tartrate) .... At Bedtime 3)  Prilosec 20 Mg Cpdr (Omeprazole) .... Once Daily 4)  Topamax 50 Mg Tabs (Topiramate) .... Two Times A Day 5)  Premarin 0.3 Mg Tabs (Estrogens Conjugated) .... Once Daily 6)  Prodigy Twist Top Lancets  28g  Misc (Lancets) .... Use One Lancet Each Day As Directed 7)  Voltaren 1 % Gel (Diclofenac Sodium) .... Apply Four Grams Four Times Daily As Directed 8)  Proair Hfa 108 (90 Base) Mcg/act Aers (Albuterol Sulfate) .... Inhale Two Puffs Four Times Daily As Directed 9)  Spiriva Handihaler 18 Mcg Caps (Tiotropium Bromide Monohydrate) .... Inhale One Capsule With Inhalation Device Each Day 10)  Lovastatin 40 Mg Tabs (Lovastatin) .... Take One Tablet By Mouth At Bedtime 11)  Glipizide 5 Mg Tabs (Glipizide) .... Take One Tablet By Mouth in The Morning and Then 1/2 Tablet By Mouth in The Afternoon Each Day 12)  Levothyroxine Sodium 75 Mcg Tabs (Levothyroxine Sodium) .... Take One Tablet By Mouth Daily 13)  Fluticasone Propionate 50 Mcg/act Susp (Fluticasone Propionate) .... Inhale Two Sprays Each Nostril Once Daily 14)  Prodigy No Coding Blood Gluc  Strp (Glucose Blood) .... Use To Test Blood Sugar Three Times Daily 15)  Nortriptyline Hcl 50 Mg Caps (Nortriptyline Hcl) .... Take One Tablet By Mouth Every Night At Bedtime 16)  Pataday 0.2 % Soln (Olopatadine Hcl) .... One Drop Into Each Eye Daily 17)  Hydrocodone-Acetaminophen 5-325 Mg Tabs (Hydrocodone-Acetaminophen) .Marland Kitchen.. 1-2 Tab By Mouth Every 6 Hours As Needed For Pain. 18)  Furosemide 20 Mg Tabs (Furosemide) .Marland Kitchen.. 1 Tab By Mouth Daily. 19)  Lisinopril 20 Mg Tabs (Lisinopril) .Marland Kitchen.. 1 Tablet By Mouth Daily 20)  Black Cohosh Root 540mg    Powd (Black Cohosh Root) .... Once Daily 21)  Benadryl 25 Mg Tabs (Diphenhydramine Hcl) .... As Needed 22)  Allegra Allergy 180 Mg Tabs (Fexofenadine Hcl) .... As Needed 23)  Nexium 40 Mg Cpdr (Esomeprazole Magnesium) .... Take 1 Tab Each Morning  Allergies (verified): No Known Drug Allergies  Past History:  Past Medical History: Last updated: 11/15/2009 Allergic rhinitis Diabetes mellitus, type II Gout Hyperlipidemia Hypertension Low back pain Peripheral neuropathy COPD  Past Surgical History: Last  updated: 03/12/2006 Colon polypectomy Hysterectomy Tubal ligation  Family History: Last updated: 01/11/2010 Family History Uterine cancer- mom died at 40 yo Father:deceased-heart problems  Social History: Last updated: 01/11/2010 Married to [employer]. Disabled. Tobacco Use - No.  Alcohol Use - no Regular Exercise - no Drug Use - no  Risk Factors: Exercise: no (01/11/2010)  Risk Factors: Smoking Status: never (01/11/2010)  Family History: Family History Uterine cancer- mom died at 31 yo Father:deceased-heart problems  Social History: Married to [employer]. Disabled. Tobacco Use - No.  Alcohol Use - no Regular Exercise - no Drug Use - no Smoking Status:  never Does Patient Exercise:  no Drug Use:  no  Review of Systems       The patient complains of dyspnea on exertion.  The patient denies fever, weight loss, weight gain, vision loss, decreased hearing, hoarseness, chest pain, syncope, peripheral edema, prolonged cough, abdominal pain, incontinence, muscle weakness, depression, and enlarged lymph nodes.         palpitations, chest discomfort  Vital Signs:  Patient profile:   65 year old female Height:      66 inches Weight:      221.50 pounds BMI:     35.88 Pulse rate:   96 / minute BP sitting:   118 / 68  (left arm) Cuff size:   regular  Vitals Entered By: Mariah Reed CMA (January 11, 2010 2:16 PM)  Physical Exam  General:  Well developed, well nourished, in no acute distress. Head:  normocephalic and atraumatic Neck:  Neck supple, no JVD. No masses, thyromegaly or abnormal cervical nodes. Lungs:  Clear bilaterally to auscultation and percussion. Heart:  Non-displaced PMI, chest non-tender; regular rate and rhythm, S1, S2 without murmurs, rubs or gallops. Carotid upstroke normal, no bruit.  Pedals normal pulses. No edema, no varicosities. Abdomen:  Bowel sounds positive; abdomen soft and non-tender without masses,  Msk:  Back normal, normal  gait. Muscle strength and tone normal. Pulses:  pulses normal in all 4 extremities Extremities:  No clubbing or cyanosis. Neurologic:  Alert and oriented x 3. Skin:  Intact without lesions or rashes. Psych:  Normal affect.   Impression & Recommendations:  Problem # 1:  LEFT BUNDLE BRANCH BLOCK (ICD-426.3) EKG is abnormal with no prior ekgs available. This is concerning, also in light of her underlying diabetes and long smoking hx, as well as her ongoing symptoms of palpitations, SOB, chest pain. We have offered a stress test though she is not interested at this time. She prefers an echo. We will order this. If there is significant abnormality, we would probably again suggest a study to exclude underlying CAD.  Her updated medication list for this problem includes:    Lisinopril 20 Mg Tabs (Lisinopril) .Marland Kitchen... Pt taking 1/4 tablet by mouth daily, as needed for elevated bp.  Problem # 2:  COPD (ICD-496) I suspect that she may have underlying COPD, though she is not very active. This would explain some  of her SOB with exertion, possibly some of her underlying tachycardia.  Her updated medication list for this problem includes:    Proair Hfa 108 (90 Base) Mcg/act Aers (Albuterol sulfate) ..... Inhale two puffs four times daily as directed    Spiriva Handihaler 18 Mcg Caps (Tiotropium bromide monohydrate) ..... Inhale one capsule with inhalation device each day  Problem # 3:  HYPERTENSION (ICD-401.9) Her bp was well controlled today on no medications. She is not taking lisinopril. She does report periods when it does run high and I have suggested that she could try hydralazine 25 mg as needed. She also takes lasix as needed.  Her updated medication list for this problem includes:    Furosemide 20 Mg Tabs (Furosemide) .Marland Kitchen... 1 tab by mouth daily as needed.    Lisinopril 20 Mg Tabs (Lisinopril) .Marland Kitchen... Pt taking 1/4 tablet by mouth daily, as needed for elevated bp.    Hydralazine Hcl 25 Mg  Tabs (Hydralazine hcl) .Marland Kitchen... Take one tablet by mouth three times a day, as needed  Problem # 4:  HYPERLIPIDEMIA (ICD-272.4) She is only taking lovastatin 20 mg daily and uncertain if she is missing doses. I have encouraged her to take a statin daily, 40 mg with a goal LDL <100.  Her updated medication list for this problem includes:    Lovastatin 40 Mg Tabs (Lovastatin) .Marland Kitchen... Take one tablet by mouth at bedtime  Patient Instructions: 1)  Your physician recommends that you schedule a follow-up appointment in: as needed  2)  Your physician has recommended you make the following change in your medication: Start taking Hydralazine 25mg  by mouth three times a day as needed  Prescriptions: HYDRALAZINE HCL 25 MG TABS (HYDRALAZINE HCL) Take one tablet by mouth three times a day, as needed  #90 x 3   Entered by:   Lanny Hurst RN   Authorized by:   Dossie Arbour MD   Signed by:   Lanny Hurst RN on 01/11/2010   Method used:   Electronically to        [facility]* (retail)       [address]       [place], [place]  [address]       Ph: [phone]       Fax: [phone]   RxID:   [id number]

## 2010-03-08 ENCOUNTER — Encounter: Payer: Self-pay | Admitting: Family Medicine

## 2010-03-09 NOTE — Assessment & Plan Note (Signed)
Summary: SINUS INFECTION / LFW   Vital Signs:  Patient profile:   65 year old female Weight:      225 pounds Temp:     97.9 degrees F oral Pulse rate:   92 / minute Pulse rhythm:   regular BP sitting:   110 / 68  (left arm) Cuff size:   large  Vitals Entered By: Sydell Axon LPN (February 08, 2010 10:19 AM) CC: ? Sinus infection, headaches and sinus drainage   History of Present Illness: Pt here as acute appt for congestion. She has been on Vicodin regularly which she takes routinely every six hours. She therefore doesn't think she can tell accurately about fever. She has not felt well. She has headache for three days globally in the head. She has no ear pain, she has rhinitis and PND which looks yellowiush green all day. She has no St, cough is active and productive more from PND and is also yellowish/green. She has no N/V. She has taken no medication except antihistamine, Allegra D,  Chlorpheniramine and Benadryl.  Problems Prior to Update: 1)  Gerd  (ICD-530.81) 2)  Left Bundle Branch Block  (ICD-426.3) 3)  Preventive Health Care  (ICD-V70.0) 4)  Dysuria  (ICD-788.1) 5)  Grief Reaction, Acute  (ICD-309.0) 6)  Acute Pharyngitis  (ICD-462) 7)  Sinusitis, Acute  (ICD-461.9) 8)  COPD  (ICD-496) 9)  Unspecified Hypothyroidism  (ICD-244.9) 10)  Peripheral Neuropathy  (ICD-356.9) 11)  Low Back Pain  (ICD-724.2) 12)  Hypertension  (ICD-401.9) 13)  Hyperlipidemia  (ICD-272.4) 14)  Gout  (ICD-274.9) 15)  Diabetes Mellitus, Type II  (ICD-250.00) 16)  Allergic Rhinitis  (ICD-477.9)  Medications Prior to Update: 1)  Lyrica 100 Mg Caps (Pregabalin) .... Three Times A Day 2)  Ambien 10 Mg Tabs (Zolpidem Tartrate) .... At Bedtime 3)  Prilosec 20 Mg Cpdr (Omeprazole) .... Once Daily 4)  Topamax 50 Mg Tabs (Topiramate) .... Two Times A Day 5)  Premarin 0.3 Mg Tabs (Estrogens Conjugated) .... Once Daily 6)  Prodigy Twist Top Lancets 28g  Misc (Lancets) .... Use One Lancet Each Day As  Directed 7)  Voltaren 1 % Gel (Diclofenac Sodium) .... Apply Four Grams Four Times Daily As Directed 8)  Proair Hfa 108 (90 Base) Mcg/act Aers (Albuterol Sulfate) .... Inhale Two Puffs Four Times Daily As Directed 9)  Spiriva Handihaler 18 Mcg Caps (Tiotropium Bromide Monohydrate) .... Inhale One Capsule With Inhalation Device Each Day 10)  Lovastatin 40 Mg Tabs (Lovastatin) .... Take One Tablet By Mouth At Bedtime 11)  Glipizide 5 Mg Tabs (Glipizide) .... Take One Tablet By Mouth in The Morning and Then 1/2 Tablet By Mouth in The Afternoon Each Day 12)  Levothyroxine Sodium 75 Mcg Tabs (Levothyroxine Sodium) .... Take One Tablet By Mouth Daily 13)  Fluticasone Propionate 50 Mcg/act Susp (Fluticasone Propionate) .... Inhale Two Sprays Each Nostril Once Daily 14)  Prodigy No Coding Blood Gluc  Strp (Glucose Blood) .... Use To Test Blood Sugar Three Times Daily 15)  Nortriptyline Hcl 50 Mg Caps (Nortriptyline Hcl) .... Take One Tablet By Mouth Every Night At Bedtime 16)  Pataday 0.2 % Soln (Olopatadine Hcl) .... One Drop Into Each Eye Daily 17)  Hydrocodone-Acetaminophen 5-325 Mg Tabs (Hydrocodone-Acetaminophen) .Marland Kitchen.. 1-2 Tab By Mouth Every 6 Hours As Needed For Pain. 18)  Furosemide 20 Mg Tabs (Furosemide) .Marland Kitchen.. 1 Tab By Mouth Daily As Needed. 19)  Lisinopril 20 Mg Tabs (Lisinopril) .... Pt Taking 1/4 Tablet By Mouth Daily, As  Needed For Elevated Bp. 20)  Black Cohosh Root 540mg    Powd (Black Cohosh Root) .... Once Daily 21)  Benadryl 25 Mg Tabs (Diphenhydramine Hcl) .... As Needed 22)  Allegra Allergy 180 Mg Tabs (Fexofenadine Hcl) .... As Needed 23)  Nexium 40 Mg Cpdr (Esomeprazole Magnesium) .... Take 1 Tab Each Morning 24)  Hydralazine Hcl 25 Mg Tabs (Hydralazine Hcl) .... Take One Tablet By Mouth Three Times A Day, As Needed 25)  Nitrostat 0.4 Mg Subl (Nitroglycerin) .Marland Kitchen.. 1 Tablet Under Tongue At Onset of Chest Pain; You May Repeat Every 5 Minutes For Up To 3 Doses. 26)  Metoprolol Tartrate 50  Mg Tabs (Metoprolol Tartrate) .... Take One Tablet By Mouth Twice A Day As Needed For Increased Heartrate  Allergies: No Known Drug Allergies  Directives: 1)  Full Code   Physical Exam  General:  Well-developed,well-nourished,in no acute distress; alert,appropriate and cooperative throughout examination, minimally congested. Head:  normocephalic, atraumatic, and no abnormalities observed.  minimal  discomfort to palpation  frontal and maxillary sinuses.  Eyes:  Conjunctiva clear bilaterally.  Ears:  R ear normal and L ear normal.   Nose:  no external deformity.  dry turbinates, minimal inflammation, no discharge seen. Mouth:  pharyngeal erythema.   no exudate. Dry mucous membr. Neck:  No deformities, masses, or tenderness noted. Lungs:  Normal respiratory effort, chest expands symmetrically. Lungs are clear to auscultation, no crackles or wheezes. Heart:  Normal rate and regular rhythm. S1 and S2 normal without gallop, murmur, click, rub or other extra sounds.   Impression & Recommendations:  Problem # 1:  URI (ICD-465.9) Assessment New  See instructions. Pt really pushing for Abs, "I'm on the early side of my usual sinus infection" Discussed ridding herself of congestion and mucous to avoid infection. Her updated medication list for this problem includes:    Benadryl 25 Mg Tabs (Diphenhydramine hcl) .Marland Kitchen... As needed    Allegra Allergy 180 Mg Tabs (Fexofenadine hcl) .Marland Kitchen... As needed  Instructed on symptomatic treatment. Call if symptoms persist or worsen.   Complete Medication List: 1)  Lyrica 100 Mg Caps (Pregabalin) .... Three times a day 2)  Ambien 10 Mg Tabs (Zolpidem tartrate) .... At bedtime 3)  Prilosec 20 Mg Cpdr (Omeprazole) .... Once daily 4)  Topamax 50 Mg Tabs (Topiramate) .... Two times a day 5)  Premarin 0.3 Mg Tabs (Estrogens conjugated) .... Once daily 6)  Prodigy Twist Top Lancets 28g Misc (Lancets) .... Use one lancet each day as directed 7)  Voltaren 1 %  Gel (Diclofenac sodium) .... Apply four grams four times daily as directed 8)  Proair Hfa 108 (90 Base) Mcg/act Aers (Albuterol sulfate) .... Inhale two puffs four times daily as directed 9)  Spiriva Handihaler 18 Mcg Caps (Tiotropium bromide monohydrate) .... Inhale one capsule with inhalation device each day 10)  Lovastatin 40 Mg Tabs (Lovastatin) .... Take one tablet by mouth at bedtime 11)  Glipizide 5 Mg Tabs (Glipizide) .... Take one tablet by mouth in the morning and then 1/2 tablet by mouth in the afternoon each day 12)  Levothyroxine Sodium 75 Mcg Tabs (Levothyroxine sodium) .... Take one tablet by mouth daily 13)  Fluticasone Propionate 50 Mcg/act Susp (Fluticasone propionate) .... Inhale two sprays each nostril once daily 14)  Prodigy No Coding Blood Gluc Strp (Glucose blood) .... Use to test blood sugar three times daily 15)  Nortriptyline Hcl 50 Mg Caps (Nortriptyline hcl) .... Take one tablet by mouth every night at bedtime 16)  Pataday 0.2 % Soln (Olopatadine hcl) .... One drop into each eye daily 17)  Hydrocodone-acetaminophen 5-325 Mg Tabs (Hydrocodone-acetaminophen) .Marland Kitchen.. 1-2 tab by mouth every 6 hours as needed for pain. 18)  Furosemide 20 Mg Tabs (Furosemide) .Marland Kitchen.. 1 tab by mouth daily as needed. 19)  Lisinopril 20 Mg Tabs (Lisinopril) .... Pt taking 1/4 tablet by mouth daily, as needed for elevated bp. 20)  Black Cohosh Root 540mg  Powd (black Cohosh Root)  .... Once daily 21)  Benadryl 25 Mg Tabs (Diphenhydramine hcl) .... As needed 22)  Allegra Allergy 180 Mg Tabs (Fexofenadine hcl) .... As needed 23)  Nexium 40 Mg Cpdr (Esomeprazole magnesium) .... Take 1 tab each morning 24)  Hydralazine Hcl 25 Mg Tabs (Hydralazine hcl) .... Take one tablet by mouth three times a day, as needed 25)  Nitrostat 0.4 Mg Subl (Nitroglycerin) .Marland Kitchen.. 1 tablet under tongue at onset of chest pain; you may repeat every 5 minutes for up to 3 doses. 26)  Metoprolol Tartrate 50 Mg Tabs (Metoprolol tartrate)  .... Take one tablet by mouth twice a day as needed for increased heartrate 27)  Amoxicillin 250 Mg Caps (Amoxicillin) .... One tab by mouth three times a day  Patient Instructions: 1)  Take Guaifenesin by going to [facility] or [facility] and getting MUCOUS RELIEF EXPECTORANT (400mg ), take 11/2 tabs by mouth AM and NOON. 2)  Drink lots of fluids anytime taking Guaifenesin.  3)  If no better, take Amox. 4)  Call if sxs worsen. Prescriptions: AMOXICILLIN 250 MG CAPS (AMOXICILLIN) one tab by mouth three times a day  #30 x 0   Entered and Authorized by:   Shaune Leeks MD   Signed by:   Shaune Leeks MD on 02/08/2010   Method used:   Print then Give to Patient   RxID:   [id number]    Orders Added: 1)  Est. Patient Level III [75643]    Current Allergies (reviewed today): No known allergies

## 2010-03-09 NOTE — Progress Notes (Signed)
Summary: Remus Loffler, hydrocodone  Phone Note Refill Request Message from:  Fax from Pharmacy on February 27, 2010 5:00 PM  Refills Requested: Medication #1:  AMBIEN 10 MG TABS at bedtime   Last Refilled: 02/02/2010  Medication #2:  HYDROCODONE-ACETAMINOPHEN 5-325 MG TABS 1-2 tab by mouth every 6 hours as needed for pain.   Last Refilled: 02/02/2010 Refill request from [facility]. [phone].   Initial call taken by: Melody Comas,  February 27, 2010 5:02 PM  Follow-up for Phone Call        Rx's called to pharmacy. Follow-up by: Linde Gillis CMA Duncan Dull),  February 28, 2010 8:14 AM    Prescriptions: HYDROCODONE-ACETAMINOPHEN 5-325 MG TABS (HYDROCODONE-ACETAMINOPHEN) 1-2 tab by mouth every 6 hours as needed for pain.  #60 x 0   Entered and Authorized by:   [name] MD   Signed by:   [name] MD on 02/28/2010   Method used:   Telephoned to ...       [facility]* (retail)       [address]       [place], [place]  [address]       Ph: [phone]       Fax: [phone]   RxID:   [id number] AMBIEN 10 MG TABS (ZOLPIDEM TARTRATE) at bedtime  #30 x 0   Entered and Authorized by:   [name] MD   Signed by:   [name] MD on 02/28/2010   Method used:   Telephoned to ...       [facility]* (retail)       [address]       [place], [place]  [address]       Ph: [phone]       Fax: [phone]   RxID:   [id number]

## 2010-03-09 NOTE — Progress Notes (Signed)
Summary: nortriptylin  Phone Note Refill Request Message from:  Fax from Pharmacy on February 27, 2010 3:29 PM  Refills Requested: Medication #1:  NORTRIPTYLINE HCL 50 MG CAPS take one tablet by mouth every night at bedtime   Last Refilled: 11/01/2009 Refill request from [facility]. [phone]  Initial call taken by: Melody Comas,  February 27, 2010 3:29 PM    Prescriptions: NORTRIPTYLINE HCL 50 MG CAPS (NORTRIPTYLINE HCL) take one tablet by mouth every night at bedtime  #30 x 3   Entered and Authorized by:   [name] MD   Signed by:   [name] MD on 02/27/2010   Method used:   Electronically to        [facility]* (retail)       [address]       [place], [place]  [address]       Ph: [phone]       Fax: [phone]   RxID:   [id number]

## 2010-03-09 NOTE — Progress Notes (Signed)
Summary: ok to change directions?  Phone Note From Pharmacy   Caller: [facility]* Summary of Call: Mariah Reed is asking if ok to change the directions on nexium to 2 a day, since 60 were called in.  Unless the directions are changed pt's insurance will not cover.                     Mariah Reed CMA, AAMA  February 28, 2010 10:32 AM   Follow-up for Phone Call        yes ok to change. Mariah Mannan MD  February 28, 2010 10:36 AM  Advised pharmacist, directions changed in  EMR.              Mariah Reed CMA, AAMA  February 28, 2010 11:29 AM     New/Updated Medications: NEXIUM 40 MG CPDR (ESOMEPRAZOLE MAGNESIUM) Take 1 to 2 by mouthdaily  Prior Medications: HYDRALAZINE HCL 25 MG TABS (HYDRALAZINE HCL) Take one tablet by mouth three times a day, as needed LYRICA 100 MG CAPS (PREGABALIN) three times a day AMBIEN 10 MG TABS (ZOLPIDEM TARTRATE) at bedtime TOPAMAX 50 MG TABS (TOPIRAMATE) two times a day PREMARIN 0.3 MG TABS (ESTROGENS CONJUGATED) once daily PRODIGY TWIST TOP LANCETS 28G  MISC (LANCETS) use one lancet each day as directed VOLTAREN 1 % GEL (DICLOFENAC SODIUM) apply four grams four times daily as directed PROAIR HFA 108 (90 BASE) MCG/ACT AERS (ALBUTEROL SULFATE) inhale two puffs four times daily as directed SPIRIVA HANDIHALER 18 MCG CAPS (TIOTROPIUM BROMIDE MONOHYDRATE) inhale one capsule with inhalation device each day LOVASTATIN 40 MG TABS (LOVASTATIN) take one tablet by mouth at bedtime GLIPIZIDE 5 MG TABS (GLIPIZIDE) take one tablet by mouth in the morning and then 1/2 tablet by mouth in the afternoon each day LEVOTHYROXINE SODIUM 75 MCG TABS (LEVOTHYROXINE SODIUM) take one tablet by mouth daily FLUTICASONE PROPIONATE 50 MCG/ACT SUSP (FLUTICASONE PROPIONATE) inhale two sprays each nostril once daily PRODIGY NO CODING BLOOD GLUC  STRP (GLUCOSE BLOOD) use to test blood sugar three times daily NORTRIPTYLINE HCL 50 MG CAPS (NORTRIPTYLINE HCL) take one tablet by mouth every  night at bedtime PATADAY 0.2 % SOLN (OLOPATADINE HCL) one drop into each eye daily HYDROCODONE-ACETAMINOPHEN 5-325 MG TABS (HYDROCODONE-ACETAMINOPHEN) 1-2 tab by mouth every 6 hours as needed for pain. FUROSEMIDE 20 MG TABS (FUROSEMIDE) 1 tab by mouth daily as needed. LISINOPRIL 20 MG TABS (LISINOPRIL) Pt taking 1/4 tablet by mouth daily, as needed for elevated BP. NITROSTAT 0.4 MG SUBL (NITROGLYCERIN) 1 tablet under tongue at onset of chest pain; you may repeat every 5 minutes for up to 3 doses. METOPROLOL TARTRATE 50 MG TABS (METOPROLOL TARTRATE) Take one tablet by mouth twice a day as needed for increased heartrate AMOXICILLIN 250 MG CAPS (AMOXICILLIN) one tab by mouth three times a day BLACK COHOSH 540 MG CAPS (BLACK COHOSH) once daily PRILOSEC 20 MG CPDR (OMEPRAZOLE) once daily BENADRYL 25 MG TABS (DIPHENHYDRAMINE HCL) as needed ALLEGRA ALLERGY 180 MG TABS (FEXOFENADINE HCL) as needed Current Allergies: No known allergies

## 2010-03-09 NOTE — Progress Notes (Signed)
Summary: Mariah Reed  Phone Note Refill Request Message from:  Fax from Pharmacy on February 02, 2010 10:54 AM  Refills Requested: Medication #1:  AMBIEN 10 MG TABS at bedtime   Last Refilled: 12/30/2009 Refill request from [facility]. [phone].    Initial call taken by: Melody Comas,  February 02, 2010 10:54 AM  Follow-up for Phone Call        Rx called to pharmacy Follow-up by: Linde Gillis CMA Duncan Dull),  February 02, 2010 11:13 AM    Prescriptions: AMBIEN 10 MG TABS (ZOLPIDEM TARTRATE) at bedtime  #30 x 0   Entered and Authorized by:   [name] MD   Signed by:   [name] MD on 02/02/2010   Method used:   Telephoned to ...       [facility]* (retail)       [address]       [place], [place]  [address]       Ph: [phone]       Fax: [phone]   RxID:   [id number]

## 2010-03-09 NOTE — Progress Notes (Signed)
Summary: refill request for vicodin  Phone Note Refill Request Message from:  Fax from Pharmacy  Refills Requested: Medication #1:  HYDROCODONE-ACETAMINOPHEN 5-325 MG TABS 1-2 tab by mouth every 6 hours as needed for pain.   Last Refilled: 01/02/2010 Faxed request from [place] [facility].  Initial call taken by: [name] CMA, AAMA,  February 02, 2010 2:34 PM  Follow-up for Phone Call        Rx called to pharmacy Follow-up by: Linde Gillis CMA Duncan Dull),  February 02, 2010 2:40 PM    Prescriptions: HYDROCODONE-ACETAMINOPHEN 5-325 MG TABS (HYDROCODONE-ACETAMINOPHEN) 1-2 tab by mouth every 6 hours as needed for pain.  #60 x 0   Entered and Authorized by:   [name] MD   Signed by:   [name] MD on 02/02/2010   Method used:   Telephoned to ...       [facility]* (retail)       [address]       [place], [place]  [address]       Ph: [phone]       Fax: [phone]   RxID:   [id number]

## 2010-03-09 NOTE — Progress Notes (Signed)
Summary: request to increase nexium dose  Phone Note Refill Request Message from:  Patient  Refills Requested: Medication #1:  NEXIUM 40 MG CPDR Take 1 tab each morning   Last Refilled: 02/02/2010 Faxed request from [place], pt wants to increase to 2 a day, wants 60.  Initial call taken by: [name] CMA, AAMA,  February 27, 2010 3:35 PM  Follow-up for Phone Call        I do not feel comfortable with her taking 80 mg. [name] MD  February 27, 2010 4:21 PM  Spoke with patient and she stated that it really helps her stomach to be able to have another Nexium to take with her evening medications, she says that she is on a lot of medications.  Spoke with Dr. Dayton Martes and she said it is ok to give her #60 with no refills.  Will send the Rx to [facility].  Message left on cell phone voicemail advising patient as instructed.  Follow-up by: Linde Gillis CMA Duncan Dull),  February 27, 2010 4:41 PM    Prescriptions: NEXIUM 40 MG CPDR (ESOMEPRAZOLE MAGNESIUM) Take 1 tab each morning  #60 x 0   Entered by:   Linde Gillis CMA (AAMA)   Authorized by:   [name] MD   Signed by:   Linde Gillis CMA (AAMA) on 02/27/2010   Method used:   Electronically to        [facility]* (retail)       [address]       [place], [place]  [address]       Ph: [phone]       Fax: [phone]   RxID:   [id number]

## 2010-03-09 NOTE — Assessment & Plan Note (Signed)
Summary: TALK ABOUT HER VISIT TO THE HEART DOCTOR / LFW   Vital Signs:  Patient profile:   65 year old female Weight:      221 pounds O2 Sat:      97 % on Room air Temp:     98.8 degrees F oral Pulse rate:   102 / minute Pulse rhythm:   regular BP sitting:   120 / 80  (left arm) Cuff size:   large  Vitals Entered By: Mervin Hack CMA (AAMA) (February 09, 2010 3:30 PM)  O2 Flow:  Room air CC: discuss visit with cardiology   History of Present Illness: 65 yo with multiple medical problems here to discuss cardiology visit. Dr. Marylou Flesher notes and 2 decho reviewed.  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  Dr. Lewie Loron recommended lexiscan vs. cath.  Pt does not want further testing.  Afraid of the pain and limited treatment options even if something is found that can be treated given her multiple comorbidities.    Current Problems (verified): 1)  Uri  (ICD-465.9) 2)  Gerd  (ICD-530.81) 3)  Left Bundle Branch Block  (ICD-426.3) 4)  Preventive Health Care  (ICD-V70.0) 5)  Dysuria  (ICD-788.1) 6)  Grief Reaction, Acute  (ICD-309.0) 7)  Acute Pharyngitis  (ICD-462) 8)  Sinusitis, Acute  (ICD-461.9) 9)  COPD  (ICD-496) 10)  Unspecified Hypothyroidism  (ICD-244.9) 11)  Peripheral Neuropathy  (ICD-356.9) 12)  Low Back Pain  (ICD-724.2) 13)  Hypertension  (ICD-401.9) 14)  Hyperlipidemia  (ICD-272.4) 15)  Gout  (ICD-274.9) 16)  Diabetes Mellitus, Type II  (ICD-250.00) 17)  Allergic Rhinitis  (ICD-477.9)  Current Medications (verified): 1)  Hydralazine Hcl 25 Mg Tabs (Hydralazine Hcl) .... Take One Tablet By Mouth Three Times A Day, As Needed 2)  Nexium 40 Mg Cpdr (Esomeprazole Magnesium) .... Take 1 Tab Each Morning 3)  Lyrica 100 Mg Caps (Pregabalin) .... Three Times A Day 4)  Ambien 10 Mg Tabs (Zolpidem Tartrate) .... At Bedtime 5)  Topamax 50 Mg Tabs (Topiramate) .... Two Times A Day 6)  Premarin 0.3 Mg Tabs (Estrogens  Conjugated) .... Once Daily 7)  Prodigy Twist Top Lancets 28g  Misc (Lancets) .... Use One Lancet Each Day As Directed 8)  Voltaren 1 % Gel (Diclofenac Sodium) .... Apply Four Grams Four Times Daily As Directed 9)  Proair Hfa 108 (90 Base) Mcg/act Aers (Albuterol Sulfate) .... Inhale Two Puffs Four Times Daily As Directed 10)  Spiriva Handihaler 18 Mcg Caps (Tiotropium Bromide Monohydrate) .... Inhale One Capsule With Inhalation Device Each Day 11)  Lovastatin 40 Mg Tabs (Lovastatin) .... Take One Tablet By Mouth At Bedtime 12)  Glipizide 5 Mg Tabs (Glipizide) .... Take One Tablet By Mouth in The Morning and Then 1/2 Tablet By Mouth in The Afternoon Each Day 13)  Levothyroxine Sodium 75 Mcg Tabs (Levothyroxine Sodium) .... Take One Tablet By Mouth Daily 14)  Fluticasone Propionate 50 Mcg/act Susp (Fluticasone Propionate) .... Inhale Two Sprays Each Nostril Once Daily 15)  Prodigy No Coding Blood Gluc  Strp (Glucose Blood) .... Use To Test Blood Sugar Three Times Daily 16)  Nortriptyline Hcl 50 Mg Caps (Nortriptyline Hcl) .... Take One Tablet By Mouth Every Night At Bedtime 17)  Pataday 0.2 % Soln (Olopatadine Hcl) .... One Drop Into Each Eye Daily 18)  Hydrocodone-Acetaminophen 5-325 Mg Tabs (Hydrocodone-Acetaminophen) .Marland Kitchen.. 1-2 Tab By Mouth Every 6 Hours As Needed For Pain. 19)  Furosemide 20 Mg Tabs (Furosemide) .Marland Kitchen.. 1 Tab By Mouth Daily As Needed. 20)  Lisinopril 20 Mg Tabs (Lisinopril) .... Pt Taking 1/4 Tablet By Mouth Daily, As Needed For Elevated Bp. 21)  Nitrostat 0.4 Mg Subl (Nitroglycerin) .Marland Kitchen.. 1 Tablet Under Tongue At Onset of Chest Pain; You May Repeat Every 5 Minutes For Up To 3 Doses. 22)  Metoprolol Tartrate 50 Mg Tabs (Metoprolol Tartrate) .... Take One Tablet By Mouth Twice A Day As Needed For Increased Heartrate 23)  Amoxicillin 250 Mg Caps (Amoxicillin) .... One Tab By Mouth Three Times A Day 24)  Black Cohosh 540 Mg Caps (Black Cohosh) .... Once Daily 25)  Prilosec 20 Mg Cpdr  (Omeprazole) .... Once Daily 26)  Benadryl 25 Mg Tabs (Diphenhydramine Hcl) .... As Needed 27)  Allegra Allergy 180 Mg Tabs (Fexofenadine Hcl) .... As Needed  Allergies: No Known Drug Allergies  Past History:  Past Medical History: Last updated: 11/15/2009 Allergic rhinitis Diabetes mellitus, type II Gout Hyperlipidemia Hypertension Low back pain Peripheral neuropathy COPD  Past Surgical History: Last updated: 03/12/2006 Colon polypectomy Hysterectomy Tubal ligation  Family History: Last updated: 01/11/2010 Family History Uterine cancer- mom died at 35 yo Father:deceased-heart problems  Social History: Last updated: 01/11/2010 Married to [employer]. Disabled. Tobacco Use - No.  Alcohol Use - no Regular Exercise - no Drug Use - no  Risk Factors: Exercise: no (01/11/2010)  Risk Factors: Smoking Status: never (01/11/2010)  Review of Systems      See HPI CV:  Denies chest pain or discomfort.  Physical Exam  General:  Well-developed,well-nourished,in no acute distress; alert,appropriate and cooperative throughout examination. Psych:  Oriented X3 and good eye contact.     Impression & Recommendations:  Problem # 1:  MYOCARDIAL INFARCTION, HX OF (ICD-412) Assessment New Time spent with patient 45 minutes, more than 50% of this time was spent counseling patient on her results and listening to her concerns.  I explained that further studies would help [place] determine if there were next possible steps or blockages but she declines.   Pt is competant to make her own decisions.  Her updated medication list for this problem includes:    Hydralazine Hcl 25 Mg Tabs (Hydralazine hcl) .Marland Kitchen... Take one tablet by mouth three times a day, as needed    Furosemide 20 Mg Tabs (Furosemide) .Marland Kitchen... 1 tab by mouth daily as needed.    Lisinopril 20 Mg Tabs (Lisinopril) .Marland Kitchen... Pt taking 1/4 tablet by mouth daily, as needed for elevated bp.    Nitrostat 0.4 Mg Subl (Nitroglycerin)  .Marland Kitchen... 1 tablet under tongue at onset of chest pain; you may repeat every 5 minutes for up to 3 doses.    Metoprolol Tartrate 50 Mg Tabs (Metoprolol tartrate) .Marland Kitchen... Take one tablet by mouth twice a day as needed for increased heartrate  Complete Medication List: 1)  Hydralazine Hcl 25 Mg Tabs (Hydralazine hcl) .... Take one tablet by mouth three times a day, as needed 2)  Nexium 40 Mg Cpdr (Esomeprazole magnesium) .... Take 1 tab each morning 3)  Lyrica 100 Mg Caps (Pregabalin) .... Three times a day 4)  Ambien 10 Mg Tabs (Zolpidem tartrate) .... At bedtime 5)  Topamax 50 Mg Tabs (Topiramate) .... Two times a day 6)  Premarin 0.3 Mg Tabs (Estrogens conjugated) .... Once daily 7)  Prodigy Twist Top Lancets 28g Misc (Lancets) .... Use one lancet each day as directed 8)  Voltaren 1 % Gel (Diclofenac sodium) .... Apply four grams four times  daily as directed 9)  Proair Hfa 108 (90 Base) Mcg/act Aers (Albuterol sulfate) .... Inhale two puffs four times daily as directed 10)  Spiriva Handihaler 18 Mcg Caps (Tiotropium bromide monohydrate) .... Inhale one capsule with inhalation device each day 11)  Lovastatin 40 Mg Tabs (Lovastatin) .... Take one tablet by mouth at bedtime 12)  Glipizide 5 Mg Tabs (Glipizide) .... Take one tablet by mouth in the morning and then 1/2 tablet by mouth in the afternoon each day 13)  Levothyroxine Sodium 75 Mcg Tabs (Levothyroxine sodium) .... Take one tablet by mouth daily 14)  Fluticasone Propionate 50 Mcg/act Susp (Fluticasone propionate) .... Inhale two sprays each nostril once daily 15)  Prodigy No Coding Blood Gluc Strp (Glucose blood) .... Use to test blood sugar three times daily 16)  Nortriptyline Hcl 50 Mg Caps (Nortriptyline hcl) .... Take one tablet by mouth every night at bedtime 17)  Pataday 0.2 % Soln (Olopatadine hcl) .... One drop into each eye daily 18)  Hydrocodone-acetaminophen 5-325 Mg Tabs (Hydrocodone-acetaminophen) .Marland Kitchen.. 1-2 tab by mouth every 6 hours  as needed for pain. 19)  Furosemide 20 Mg Tabs (Furosemide) .Marland Kitchen.. 1 tab by mouth daily as needed. 20)  Lisinopril 20 Mg Tabs (Lisinopril) .... Pt taking 1/4 tablet by mouth daily, as needed for elevated bp. 21)  Nitrostat 0.4 Mg Subl (Nitroglycerin) .Marland Kitchen.. 1 tablet under tongue at onset of chest pain; you may repeat every 5 minutes for up to 3 doses. 22)  Metoprolol Tartrate 50 Mg Tabs (Metoprolol tartrate) .... Take one tablet by mouth twice a day as needed for increased heartrate 23)  Amoxicillin 250 Mg Caps (Amoxicillin) .... One tab by mouth three times a day 24)  Black Cohosh 540 Mg Caps (Black cohosh) .... Once daily 25)  Prilosec 20 Mg Cpdr (Omeprazole) .... Once daily 26)  Benadryl 25 Mg Tabs (Diphenhydramine hcl) .... As needed 27)  Allegra Allergy 180 Mg Tabs (Fexofenadine hcl) .... As needed   Orders Added: 1)  Est. Patient Level IV [60454]    Current Allergies (reviewed today): No known allergies

## 2010-03-09 NOTE — Medication Information (Signed)
Summary: Order for Diabetes Testing Supplies  Order for Diabetes Testing Supplies   Imported By: Maryln Gottron 02/14/2010 14:19:28  _____________________________________________________________________  External Attachment:    Type:   Image     Comment:   External Document

## 2010-03-15 ENCOUNTER — Encounter: Payer: Self-pay | Admitting: Family Medicine

## 2010-03-15 ENCOUNTER — Ambulatory Visit (INDEPENDENT_AMBULATORY_CARE_PROVIDER_SITE_OTHER): Payer: Medicare Other | Admitting: Family Medicine

## 2010-03-15 ENCOUNTER — Other Ambulatory Visit: Payer: Self-pay | Admitting: Family Medicine

## 2010-03-15 DIAGNOSIS — J329 Chronic sinusitis, unspecified: Secondary | ICD-10-CM

## 2010-03-15 DIAGNOSIS — R519 Headache, unspecified: Secondary | ICD-10-CM | POA: Insufficient documentation

## 2010-03-15 DIAGNOSIS — R51 Headache: Secondary | ICD-10-CM

## 2010-03-15 DIAGNOSIS — E785 Hyperlipidemia, unspecified: Secondary | ICD-10-CM

## 2010-03-15 DIAGNOSIS — E119 Type 2 diabetes mellitus without complications: Secondary | ICD-10-CM

## 2010-03-15 DIAGNOSIS — I10 Essential (primary) hypertension: Secondary | ICD-10-CM

## 2010-03-15 LAB — HEPATIC FUNCTION PANEL
Alkaline Phosphatase: 60 U/L (ref 39–117)
Bilirubin, Direct: 0.1 mg/dL (ref 0.0–0.3)
Total Protein: 7.3 g/dL (ref 6.0–8.3)

## 2010-03-15 LAB — BASIC METABOLIC PANEL
BUN: 12 mg/dL (ref 6–23)
CO2: 28 mEq/L (ref 19–32)
Chloride: 106 mEq/L (ref 96–112)
Creatinine, Ser: 0.8 mg/dL (ref 0.4–1.2)
Glucose, Bld: 133 mg/dL — ABNORMAL HIGH (ref 70–99)

## 2010-03-15 LAB — LIPID PANEL
LDL Cholesterol: 115 mg/dL — ABNORMAL HIGH (ref 0–99)
Total CHOL/HDL Ratio: 4

## 2010-03-15 LAB — HEMOGLOBIN A1C: Hgb A1c MFr Bld: 6.1 % (ref 4.6–6.5)

## 2010-03-16 ENCOUNTER — Telehealth: Payer: Self-pay | Admitting: Cardiovascular Disease

## 2010-03-16 ENCOUNTER — Encounter: Payer: Self-pay | Admitting: Cardiovascular Disease

## 2010-03-16 DIAGNOSIS — R0602 Shortness of breath: Secondary | ICD-10-CM | POA: Insufficient documentation

## 2010-03-16 DIAGNOSIS — R9389 Abnormal findings on diagnostic imaging of other specified body structures: Secondary | ICD-10-CM | POA: Insufficient documentation

## 2010-03-16 DIAGNOSIS — R9431 Abnormal electrocardiogram [ECG] [EKG]: Secondary | ICD-10-CM | POA: Insufficient documentation

## 2010-03-16 DIAGNOSIS — R002 Palpitations: Secondary | ICD-10-CM | POA: Insufficient documentation

## 2010-03-16 DIAGNOSIS — R079 Chest pain, unspecified: Secondary | ICD-10-CM | POA: Insufficient documentation

## 2010-03-17 ENCOUNTER — Other Ambulatory Visit: Payer: Self-pay | Admitting: Cardiovascular Disease

## 2010-03-17 ENCOUNTER — Encounter: Payer: Self-pay | Admitting: Family Medicine

## 2010-03-17 ENCOUNTER — Encounter (INDEPENDENT_AMBULATORY_CARE_PROVIDER_SITE_OTHER): Payer: Self-pay | Admitting: *Deleted

## 2010-03-20 ENCOUNTER — Telehealth: Payer: Self-pay | Admitting: Family Medicine

## 2010-03-21 ENCOUNTER — Telehealth: Payer: Self-pay | Admitting: Cardiovascular Disease

## 2010-03-22 ENCOUNTER — Ambulatory Visit: Payer: Self-pay | Admitting: Family Medicine

## 2010-03-23 NOTE — Progress Notes (Addendum)
Summary: Cardiac CTA  ---- Converted from flag ---- ---- 03/15/2010 1:45 PM, [name] MD wrote: We did talk about the possiblity of needed more invasive testing if the CTA is abnormal.  I just can't seem to convince her to go through with cath or stress test.  If possible, your office find out if she would qualify because she is willing to do this test (she saw it on TV show : ) )  ---- 03/15/2010 1:17 PM, Dossie Arbour MD wrote: It is uncertain if insurance would cover a cardiac CTA though we could try. Usually they need an abnormal stress test. If she prefers, we could see if she would quality? If she has some disease on the CT scan, she may end up needing a stress test as well as CT scan, as a CT can not tell me the difference between >50% blockage and 70 or 80% blockage. Pity if we end up needing both... Let me know what I can do. Tim   ---- 03/15/2010 12:14 PM, [name] MD wrote: [name] Dr. Mariah Milling, I saw Ms. Trawick today and she is interested in a cardiac CT rather than cath or other invasive measures.  Would that be something she would be a good candidate for? Thanks, Talia ------------------------------  Appended Document: Cardiac CTA Megan, can we order/preapprove a cardiac CTA? She has ABN ekg, very ABN echo,  diabetes and long smoking hx,  symptoms of palpitations, SOB, chest pain. COPD  High risk for CAD. Thanks Tim  Appended Document: Cardiac CTA Will order and have precerted, if it is not approved, pt will be notified before procedure. Attempted to contact pt LMOM TCB. /MES  Appended Document: Cardiac CTA Pt is scheduled for Cardiac CTA 03/29/10.

## 2010-03-23 NOTE — Miscellaneous (Signed)
Summary: Cardiac CTA orders  Clinical Lists Changes  Problems: Added new problem of CHEST PAIN UNSPECIFIED (ICD-786.50) Added new problem of SHORTNESS OF BREATH (ICD-786.05) Added new problem of ELECTROCARDIOGRAM, ABNORMAL (ICD-794.31) Added new problem of ECHOCARDIOGRAM, ABNORMAL (ICD-793.2) Added new problem of PALPITATIONS (ICD-785.1) Orders: Added new Referral order of Cardiac CTA (Cardiac CTA) - Signed

## 2010-03-23 NOTE — Assessment & Plan Note (Signed)
Summary: Headaches x   Vital Signs:  Patient profile:   65 year old female Height:      66 inches Weight:      226 pounds BMI:     36.61 Temp:     98.4 degrees F oral Pulse rate:   100 / minute Pulse rhythm:   regular BP sitting:   132 / 84  (left arm) Cuff size:   large  Vitals Entered By: Linde Gillis CMA Duncan Dull) (March 15, 2010 12:01 PM) CC: headaches x 1 month   History of Present Illness: 65 yo with multiple medical problems well known to me here for right sided headaches x 1 month.  Has a h/o migraines and sinus headaches, previously on higher dose of Topamax that seemed to help.  Has had problms with sinus headaches that seem to trigger these "migraines." These right sided headaches are not associated with photophobia, nausea or vomiting.  No tearing or runny nose.  No focal neurological signs but they are more intense, can last for days and do not respond to "sleeping them off."      Current Medications (verified): 1)  Hydralazine Hcl 25 Mg Tabs (Hydralazine Hcl) .... Take One Tablet By Mouth Three Times A Day, As Needed 2)  Nexium 40 Mg Cpdr (Esomeprazole Magnesium) .... Take 1 To 2 By Mouthdaily 3)  Lyrica 100 Mg Caps (Pregabalin) .... Three Times A Day 4)  Ambien 10 Mg Tabs (Zolpidem Tartrate) .... At Bedtime 5)  Topamax 50 Mg Tabs (Topiramate) .... 3 Tabs By Mouth Daily. 6)  Premarin 0.3 Mg Tabs (Estrogens Conjugated) .... Once Daily 7)  Prodigy Twist Top Lancets 28g  Misc (Lancets) .... Use One Lancet Each Day As Directed 8)  Voltaren 1 % Gel (Diclofenac Sodium) .... Apply Four Grams Four Times Daily As Directed 9)  Proair Hfa 108 (90 Base) Mcg/act Aers (Albuterol Sulfate) .... Inhale Two Puffs Four Times Daily As Directed 10)  Spiriva Handihaler 18 Mcg Caps (Tiotropium Bromide Monohydrate) .... Inhale One Capsule With Inhalation Device Each Day 11)  Lovastatin 40 Mg Tabs (Lovastatin) .... Take One Tablet By Mouth At Bedtime 12)  Glipizide 5 Mg Tabs  (Glipizide) .... Take One Tablet By Mouth in The Morning and Then 1/2 Tablet By Mouth in The Afternoon Each Day 13)  Levothyroxine Sodium 75 Mcg Tabs (Levothyroxine Sodium) .... Take One Tablet By Mouth Daily 14)  Fluticasone Propionate 50 Mcg/act Susp (Fluticasone Propionate) .... Inhale Two Sprays Each Nostril Once Daily 15)  Prodigy No Coding Blood Gluc  Strp (Glucose Blood) .... Use To Test Blood Sugar Three Times Daily 16)  Nortriptyline Hcl 50 Mg Caps (Nortriptyline Hcl) .... Take One Tablet By Mouth Every Night At Bedtime 17)  Pataday 0.2 % Soln (Olopatadine Hcl) .... One Drop Into Each Eye Daily 18)  Hydrocodone-Acetaminophen 5-325 Mg Tabs (Hydrocodone-Acetaminophen) .Marland Kitchen.. 1-2 Tab By Mouth Every 6 Hours As Needed For Pain. 19)  Furosemide 20 Mg Tabs (Furosemide) .Marland Kitchen.. 1 Tab By Mouth Daily As Needed. 20)  Lisinopril 20 Mg Tabs (Lisinopril) .... Pt Taking 1/4 Tablet By Mouth Daily, As Needed For Elevated Bp. 21)  Nitrostat 0.4 Mg Subl (Nitroglycerin) .Marland Kitchen.. 1 Tablet Under Tongue At Onset of Chest Pain; You May Repeat Every 5 Minutes For Up To 3 Doses. 22)  Metoprolol Tartrate 50 Mg Tabs (Metoprolol Tartrate) .... Take One Tablet By Mouth Twice A Day As Needed For Increased Heartrate 23)  Amoxicillin 250 Mg Caps (Amoxicillin) .... One Tab By  Mouth Three Times A Day 24)  Black Cohosh 540 Mg Caps (Black Cohosh) .... Once Daily 25)  Prilosec 20 Mg Cpdr (Omeprazole) .... Once Daily 26)  Benadryl 25 Mg Tabs (Diphenhydramine Hcl) .... As Needed 27)  Allegra Allergy 180 Mg Tabs (Fexofenadine Hcl) .... As Needed  Allergies (verified): No Known Drug Allergies  Past History:  Past Medical History: Last updated: 11/15/2009 Allergic rhinitis Diabetes mellitus, type II Gout Hyperlipidemia Hypertension Low back pain Peripheral neuropathy COPD  Past Surgical History: Last updated: 03/12/2006 Colon polypectomy Hysterectomy Tubal ligation  Family History: Last updated: 01/11/2010 Family  History Uterine cancer- mom died at 21 yo Father:deceased-heart problems  Social History: Last updated: 01/11/2010 Married to [employer]. Disabled. Tobacco Use - No.  Alcohol Use - no Regular Exercise - no Drug Use - no  Risk Factors: Exercise: no (01/11/2010)  Risk Factors: Smoking Status: never (01/11/2010)  Review of Systems      See HPI General:  Denies fever. Eyes:  Denies blurring and double vision. ENT:  Complains of nasal congestion and sinus pressure. Neuro:  Complains of headaches; denies numbness, seizures, visual disturbances, and weakness.  Physical Exam  General:  Well-developed,well-nourished,in no acute distress; alert,appropriate and cooperative throughout examination. Nose:  boggy turbinates, deviated septum.  sinuses NTTP Mouth:  pharyngeal erythema.   no exudate. Dry mucous membr. Psych:  Oriented X3 and good eye contact.     Impression & Recommendations:  Problem # 1:  SINUSITIS, CHRONIC (ICD-473.9) Assessment Deteriorated Will refer to ENT for further workup. Continue Flonase. Her updated medication list for this problem includes:    Fluticasone Propionate 50 Mcg/act Susp (Fluticasone propionate) ..... Inhale two sprays each nostril once daily    Amoxicillin 250 Mg Caps (Amoxicillin) ..... One tab by mouth three times a day  Orders: ENT Referral (ENT)  Problem # 2:  HEADACHE (ICD-784.0) Assessment: Deteriorated ?sinus/tension headaches versus migraines. Increase Topamax and refer to ENT for further evaluation and treatment of her sinusitis. Her updated medication list for this problem includes:    Hydrocodone-acetaminophen 5-325 Mg Tabs (Hydrocodone-acetaminophen) .Marland Kitchen... 1-2 tab by mouth every 6 hours as needed for pain.    Metoprolol Tartrate 50 Mg Tabs (Metoprolol tartrate) .Marland Kitchen... Take one tablet by mouth twice a day as needed for increased heartrate  Complete Medication List: 1)  Hydralazine Hcl 25 Mg Tabs (Hydralazine hcl) .... Take  one tablet by mouth three times a day, as needed 2)  Nexium 40 Mg Cpdr (Esomeprazole magnesium) .... Take 1 to 2 by mouthdaily 3)  Lyrica 100 Mg Caps (Pregabalin) .... Three times a day 4)  Ambien 10 Mg Tabs (Zolpidem tartrate) .... At bedtime 5)  Topamax 50 Mg Tabs (Topiramate) .... 3 tabs by mouth daily. 6)  Premarin 0.3 Mg Tabs (Estrogens conjugated) .... Once daily 7)  Prodigy Twist Top Lancets 28g Misc (Lancets) .... Use one lancet each day as directed 8)  Voltaren 1 % Gel (Diclofenac sodium) .... Apply four grams four times daily as directed 9)  Proair Hfa 108 (90 Base) Mcg/act Aers (Albuterol sulfate) .... Inhale two puffs four times daily as directed 10)  Spiriva Handihaler 18 Mcg Caps (Tiotropium bromide monohydrate) .... Inhale one capsule with inhalation device each day 11)  Lovastatin 40 Mg Tabs (Lovastatin) .... Take one tablet by mouth at bedtime 12)  Glipizide 5 Mg Tabs (Glipizide) .... Take one tablet by mouth in the morning and then 1/2 tablet by mouth in the afternoon each day 13)  Levothyroxine Sodium 75  Mcg Tabs (Levothyroxine sodium) .... Take one tablet by mouth daily 14)  Fluticasone Propionate 50 Mcg/act Susp (Fluticasone propionate) .... Inhale two sprays each nostril once daily 15)  Prodigy No Coding Blood Gluc Strp (Glucose blood) .... Use to test blood sugar three times daily 16)  Nortriptyline Hcl 50 Mg Caps (Nortriptyline hcl) .... Take one tablet by mouth every night at bedtime 17)  Pataday 0.2 % Soln (Olopatadine hcl) .... One drop into each eye daily 18)  Hydrocodone-acetaminophen 5-325 Mg Tabs (Hydrocodone-acetaminophen) .Marland Kitchen.. 1-2 tab by mouth every 6 hours as needed for pain. 19)  Furosemide 20 Mg Tabs (Furosemide) .Marland Kitchen.. 1 tab by mouth daily as needed. 20)  Lisinopril 20 Mg Tabs (Lisinopril) .... Pt taking 1/4 tablet by mouth daily, as needed for elevated bp. 21)  Nitrostat 0.4 Mg Subl (Nitroglycerin) .Marland Kitchen.. 1 tablet under tongue at onset of chest pain; you may  repeat every 5 minutes for up to 3 doses. 22)  Metoprolol Tartrate 50 Mg Tabs (Metoprolol tartrate) .... Take one tablet by mouth twice a day as needed for increased heartrate 23)  Amoxicillin 250 Mg Caps (Amoxicillin) .... One tab by mouth three times a day 24)  Black Cohosh 540 Mg Caps (Black cohosh) .... Once daily 25)  Prilosec 20 Mg Cpdr (Omeprazole) .... Once daily 26)  Benadryl 25 Mg Tabs (Diphenhydramine hcl) .... As needed 27)  Allegra Allergy 180 Mg Tabs (Fexofenadine hcl) .... As needed  Other Orders: Venipuncture (19147) TLB-Hepatic/Liver Function Pnl (80076-HEPATIC) TLB-BMP (Basic Metabolic Panel-BMET) (80048-METABOL) TLB-Lipid Panel (80061-LIPID) TLB-A1C / Hgb A1C (Glycohemoglobin) (83036-A1C)  Patient Instructions: 1)  Please stop by to see Shirlee Limerick on your way out. 2)  Increase Topax to 3 tabs a day. Prescriptions: TOPAMAX 50 MG TABS (TOPIRAMATE) 3 tabs by mouth daily.  #90 x 3   Entered and Authorized by:   [name] MD   Signed by:   [name] MD on 03/15/2010   Method used:   Electronically to        [facility]* (retail)       [address]       [place], [place]  [address]       Ph: [phone]       Fax: [phone]   RxID:   [id number]    Orders Added: 1)  Venipuncture [03474] 2)  TLB-Hepatic/Liver Function Pnl [80076-HEPATIC] 3)  TLB-BMP (Basic Metabolic Panel-BMET) [80048-METABOL] 4)  TLB-Lipid Panel [80061-LIPID] 5)  TLB-A1C / Hgb A1C (Glycohemoglobin) [83036-A1C] 6)  ENT Referral [ENT] 7)  Est. Patient Level IV [25956]    Current Allergies (reviewed today): No known allergies

## 2010-03-23 NOTE — Medication Information (Signed)
Summary: [employer] Prior Auth. approval for Nexium Capsule Dr  Vickey Sages Prior Berkley Harvey. approval for Nexium Capsule Dr   Imported By: Beau Fanny 03/14/2010 14:41:47  _____________________________________________________________________  External Attachment:    Type:   Image     Comment:   External Document

## 2010-03-23 NOTE — Progress Notes (Signed)
Summary: prior auth needed for higher dose of nexium  Phone Note From Pharmacy   Caller: [employer] Summary of Call: Prior Berkley Harvey is needed for increased dose of nexium, form is on your desk.                      [name] CMA, AAMA  March 07, 2010 11:12 AM   Follow-up for Phone Call        on my desk. [name] MD  March 07, 2010 11:17 AM  Form faxed to [facility] Follow-up by: [name] CMA, AAMA,  March 07, 2010 1:13 PM  Additional Follow-up for Phone Call Additional follow up Details #1::        Prior auth given for nexium, form placed on doctors desk for signature and scanning.             [name] CMA, AAMA  March 13, 2010 8:40 AM      Appended Document: prior auth needed for higher dose of nexium Received PA approval for Nexium for additional quantity.  Approved from 02/15/2010 until 03/08/2011.  Patient and pharmacy advised.

## 2010-03-23 NOTE — Letter (Signed)
Summary: Generic Letter  Architectural technologist, Main Office  [address]   [place], [place] [address]   Phone: [phone]  Fax: [phone]        March 17, 2010 MRN: [id number]    Mariah Reed [address] [place], [place]  [address]    Dear Ms. [place],    Dr. Mariah Milling has ordered you to have Cardiac CT on : 03/29/10 at 1pm.  DO NOT EAT OR DRINK ANYTHING AFTER 9AM, THE MORNING OF YOUR PROCEDURE.  PLEASE ARRIVE 1 HOUR PRIOR TO APPOINTMENT TIME,AT THE [place] OUT-PATIENT ADMISSION OFFICE LOCATED ON THE FIRST FLOOR NEAR THE GIFT SHOP.      Sincerely,  Merita Norton Lloyd-Fate

## 2010-03-24 ENCOUNTER — Encounter: Payer: Self-pay | Admitting: Cardiovascular Disease

## 2010-03-28 ENCOUNTER — Telehealth: Payer: Self-pay | Admitting: Family Medicine

## 2010-03-29 ENCOUNTER — Other Ambulatory Visit (HOSPITAL_COMMUNITY): Payer: Medicaid Other

## 2010-03-29 NOTE — Letter (Signed)
Summary: Generic Engineer, agricultural at [facility] [address]   [place], [place] [address]   Phone: [phone]  Fax: [phone]        March 24, 2010 MRN: [id number]    Janaya Gargis [address] [place], [place]  [address]    Dear Ms. Radu,    You are scheduled for a Cardiac CT with Dr. Mariah Milling on Friday 03/31/10. Please arrive at the Outpatient Registration at [facility] at 8:30 AM the morning of your procedure.   Do not eat or drink anything after midnight before your procedure. You need to take your Metoprolol 50mg  the evening before the procedure and 50mg  the morning of your procedure right before you leave home.  If you have any questions you may call our office @ [phone].        Sincerely,  Lanny Hurst RN

## 2010-03-29 NOTE — Progress Notes (Signed)
Summary: requests meds for yeast  Phone Note From Pharmacy   Caller: [facility] Summary of Call: Pt is on a 21 day course of cefdinifer 600 mg's from her ENT and is requesting diflucan and and antifungal cream.  Her yeast infection from previously has not cleared up and the rash under her breasts is worse.  Please send to [facility]. Initial call taken by: [name] CMA, AAMA,  March 20, 2010 10:49 AM    New/Updated Medications: DIFLUCAN 150 MG TABS (FLUCONAZOLE) once daily NYSTATIN 100000 UNIT/GM POWD (NYSTATIN) Apply to area under breasts two times a day as needed. Prescriptions: NYSTATIN 100000 UNIT/GM POWD (NYSTATIN) Apply to area under breasts two times a day as needed.  #30 g x 0   Entered and Authorized by:   [name] MD   Signed by:   [name] MD on 03/20/2010   Method used:   Electronically to        [facility]* (retail)       [address]       [place], [place]  [address]       Ph: [phone]       Fax: [phone]   RxID:   [id number] DIFLUCAN 150 MG TABS (FLUCONAZOLE) once daily  #1 x 0   Entered and Authorized by:   [name] MD   Signed by:   [name] MD on 03/20/2010   Method used:   Electronically to        [facility]* (retail)       [address]       [place], [place]  [address]       Ph: [phone]       Fax: [phone]   RxID:   [id number]

## 2010-03-29 NOTE — Progress Notes (Signed)
Summary: [place] CT  Phone Note Call from Patient Call back at Home Phone [phone]   Summary of Call: Pt is scheduled for a cardiac CT on 03/29/10 @ 1:00 at [facility].  Pt would like to have this at [facility] as she has difficulty getting to [place]. Initial call taken by: Harlon Flor,  March 21, 2010 2:07 PM  Follow-up for Phone Call        cardiac CTA scanner not done at [facility], only [place]. Fancy machine only in [place]     Appended Document: [place] CT Notified pt this is only done in [place].

## 2010-03-30 ENCOUNTER — Telehealth: Payer: Self-pay | Admitting: Family Medicine

## 2010-03-31 ENCOUNTER — Telehealth: Payer: Self-pay | Admitting: Family Medicine

## 2010-03-31 ENCOUNTER — Inpatient Hospital Stay (HOSPITAL_COMMUNITY): Admission: RE | Admit: 2010-03-31 | Payer: Medicare Other | Source: Ambulatory Visit

## 2010-04-04 NOTE — Progress Notes (Signed)
Summary: refill request for nystatin  Phone Note Refill Request Message from:  Fax from Pharmacy  Refills Requested: Medication #1:  NYSTATIN 100000 UNIT/GM POWD Apply to area under breasts two times a day as needed..   Last Refilled: 03/20/2010 Faxed request from [place], [phone].  Initial call taken by: [name] CMA, AAMA,  March 28, 2010 4:17 PM    Prescriptions: NYSTATIN 100000 UNIT/GM POWD (NYSTATIN) Apply to area under breasts two times a day as needed.  #30 g x 0   Entered and Authorized by:   [name] MD   Signed by:   [name] MD on 03/29/2010   Method used:   Electronically to        [facility]* (retail)       [address]       [place], [place]  [address]       Ph: [phone]       Fax: [phone]   RxID:   [id number]

## 2010-04-04 NOTE — Progress Notes (Signed)
Summary: wants to switch to nystatin cream  Phone Note Call from Patient   Caller: [name] from [place]- [phone] Call For: [name] MD Summary of Call: Rob called asking if they could switch the nystatin powder to the cream because patient says that the powder is not helping very much and feels that the cream would be better. Please advise. Initial call taken by: Melody Comas,  March 30, 2010 2:10 PM    New/Updated Medications: NYSTATIN 100000 UNIT/GM CREA (NYSTATIN) apply area two times a day prn Prescriptions: NYSTATIN 100000 UNIT/GM CREA (NYSTATIN) apply area two times a day prn  #30 g x 0   Entered and Authorized by:   [name] MD   Signed by:   [name] MD on 03/30/2010   Method used:   Electronically to        [facility]* (retail)       [address]       [place], [place]  [address]       Ph: [phone]       Fax: [phone]   RxID:   [id number]

## 2010-04-04 NOTE — Progress Notes (Signed)
Summary: Rx Ambien  Phone Note Refill Request Call back at [phone] Message from:  [facility] on March 31, 2010 2:14 PM  Refills Requested: Medication #1:  AMBIEN 10 MG TABS at bedtime   Last Refilled: 02/28/2010 Received faxed refill request please advise.   Method Requested: Telephone to Pharmacy Initial call taken by: Linde Gillis CMA Duncan Dull),  March 31, 2010 2:15 PM  Follow-up for Phone Call        Rx called to pharmacy Follow-up by: Linde Gillis CMA Duncan Dull),  March 31, 2010 2:34 PM    Prescriptions: AMBIEN 10 MG TABS (ZOLPIDEM TARTRATE) at bedtime  #30 x 0   Entered and Authorized by:   [name] MD   Signed by:   [name] MD on 03/31/2010   Method used:   Telephoned to ...       [facility]* (retail)       [address]       [place], [place]  [address]       Ph: [phone]       Fax: [phone]   RxID:   [id number]

## 2010-04-04 NOTE — Progress Notes (Signed)
Summary: Rx Hydrocodone  Phone Note Refill Request Message from:  Fax from Pharmacy  Refills Requested: Medication #1:  HYDROCODONE-ACETAMINOPHEN 5-325 MG TABS 1-2 tab by mouth every 6 hours as needed for pain.   Last Refilled: 02/28/2010 Faxed request from [place], [phone].  Initial call taken by: [name] CMA, AAMA,  March 31, 2010 9:03 AM  Follow-up for Phone Call        Rx called to pharmacy Follow-up by: Linde Gillis CMA Duncan Dull),  March 31, 2010 9:52 AM    Prescriptions: HYDROCODONE-ACETAMINOPHEN 5-325 MG TABS (HYDROCODONE-ACETAMINOPHEN) 1-2 tab by mouth every 6 hours as needed for pain.  #60 x 0   Entered and Authorized by:   [name] MD   Signed by:   [name] MD on 03/31/2010   Method used:   Telephoned to ...       [facility]* (retail)       [address]       [place], [place]  [address]       Ph: [phone]       Fax: [phone]   RxID:   [id number]

## 2010-04-10 ENCOUNTER — Encounter: Payer: Self-pay | Admitting: Family Medicine

## 2010-04-11 ENCOUNTER — Encounter: Payer: Self-pay | Admitting: Family Medicine

## 2010-04-11 DIAGNOSIS — G501 Atypical facial pain: Secondary | ICD-10-CM | POA: Insufficient documentation

## 2010-04-13 NOTE — Consult Note (Signed)
Summary: [facility]  [facility]   Imported By: Sherian Rein 04/03/2010 09:15:44  _____________________________________________________________________  External Attachment:    Type:   Image     Comment:   External Document  Appended Document: [facility] pansinusitis/nasal obstruction. rx for cefdinir and ?septoplasty discussed

## 2010-04-14 ENCOUNTER — Telehealth: Payer: Self-pay | Admitting: Family Medicine

## 2010-04-15 ENCOUNTER — Encounter: Payer: Self-pay | Admitting: Family Medicine

## 2010-04-18 ENCOUNTER — Ambulatory Visit (INDEPENDENT_AMBULATORY_CARE_PROVIDER_SITE_OTHER): Payer: Medicare Other | Admitting: Family Medicine

## 2010-04-18 ENCOUNTER — Encounter: Payer: Self-pay | Admitting: Family Medicine

## 2010-04-18 DIAGNOSIS — K625 Hemorrhage of anus and rectum: Secondary | ICD-10-CM | POA: Insufficient documentation

## 2010-04-18 NOTE — Miscellaneous (Signed)
Summary: Orders Update  Clinical Lists Changes  Problems: Added new problem of ATYPICAL FACE PAIN (ICD-350.2) Orders: Added new Referral order of Neurology Referral (Neuro) - Signed

## 2010-04-18 NOTE — Progress Notes (Signed)
Summary: pt wants flagyl  Phone Note Call from Patient Call back at Home Phone [phone]   Caller: Patient Call For: [name] MD Summary of Call: Pt states she has rectal bleeding with BM's, she thinks this is because she has been on so many antibiotics lately, recently finished 21 day course of cefdinir- finished 2 or 3 days ago.  She says this is bright red.  Had this problem several years ago and was given some type of medicine that stopped the bleeding and the irritation, she thinks it was flagyl.  She is asking that this be called to [facility].  I told her she may need office visit first.  Please advise. Initial call taken by: [name] CMA, AAMA,  April 14, 2010 2:15 PM  Follow-up for Phone Call        This would require an office visit.  She needs to be evaluated. [name] MD  April 14, 2010 2:39 PM  Advised pt, she declines appt. She will see how she does and will call back if needed.              [name] CMA, AAMA  April 14, 2010 3:25 PM

## 2010-04-21 ENCOUNTER — Telehealth: Payer: Self-pay | Admitting: Family Medicine

## 2010-04-25 NOTE — Assessment & Plan Note (Signed)
Summary: bleeding after bowel movements   Vital Signs:  Patient profile:   65 year old female Height:      66 inches Weight:      225.50 pounds BMI:     36.53 Temp:     98.9 degrees F oral Pulse rate:   81 / minute Pulse rhythm:   regular BP sitting:   130 / 82  (left arm) Cuff size:   large  Vitals Entered By: Linde Gillis CMA Duncan Dull) (April 18, 2010 10:31 AM) CC: bleeding after bowel movements   History of Present Illness: 65 yo with multiple medical problems well known to me here for bleeding after bowel movements.  Has a h/o diverticulosis but has not had a colonoscopy in several years, has been refusing them.  One week ago, small amount of blood after BM, was on the toilet paper.  THis has continued on and off since then.  Also having some LLQ pain.  Pt refusing rectal exam or further work up. Stool are not dark/tarry.  NO mucous in stool.  Bleeding is non painful.  Denies nausea, fevers, vomiting or other symptoms.  Current Medications (verified): 1)  Hydralazine Hcl 25 Mg Tabs (Hydralazine Hcl) .... Take One Tablet By Mouth Three Times A Day, As Needed 2)  Nexium 40 Mg Cpdr (Esomeprazole Magnesium) .... Take 1 To 2 By Mouthdaily 3)  Lyrica 100 Mg Caps (Pregabalin) .... Three Times A Day 4)  Ambien 10 Mg Tabs (Zolpidem Tartrate) .... At Bedtime 5)  Topamax 50 Mg Tabs (Topiramate) .... 3 Tabs By Mouth Daily. 6)  Premarin 0.3 Mg Tabs (Estrogens Conjugated) .... Once Daily 7)  Prodigy Twist Top Lancets 28g  Misc (Lancets) .... Use One Lancet Each Day As Directed 8)  Voltaren 1 % Gel (Diclofenac Sodium) .... Apply Four Grams Four Times Daily As Directed 9)  Proair Hfa 108 (90 Base) Mcg/act Aers (Albuterol Sulfate) .... Inhale Two Puffs Four Times Daily As Directed 10)  Spiriva Handihaler 18 Mcg Caps (Tiotropium Bromide Monohydrate) .... Inhale One Capsule With Inhalation Device Each Day 11)  Lovastatin 40 Mg Tabs (Lovastatin) .... Take One Tablet By Mouth At Bedtime 12)   Glipizide 5 Mg Tabs (Glipizide) .... Take One Tablet By Mouth in The Morning and Then 1/2 Tablet By Mouth in The Afternoon Each Day 13)  Levothyroxine Sodium 75 Mcg Tabs (Levothyroxine Sodium) .... Take One Tablet By Mouth Daily 14)  Fluticasone Propionate 50 Mcg/act Susp (Fluticasone Propionate) .... Inhale Two Sprays Each Nostril Once Daily 15)  Prodigy No Coding Blood Gluc  Strp (Glucose Blood) .... Use To Test Blood Sugar Three Times Daily 16)  Nortriptyline Hcl 50 Mg Caps (Nortriptyline Hcl) .... Take One Tablet By Mouth Every Night At Bedtime 17)  Pataday 0.2 % Soln (Olopatadine Hcl) .... One Drop Into Each Eye Daily 18)  Hydrocodone-Acetaminophen 5-325 Mg Tabs (Hydrocodone-Acetaminophen) .Marland Kitchen.. 1-2 Tab By Mouth Every 6 Hours As Needed For Pain. 19)  Furosemide 20 Mg Tabs (Furosemide) .Marland Kitchen.. 1 Tab By Mouth Daily As Needed. 20)  Lisinopril 20 Mg Tabs (Lisinopril) .... Pt Taking 1/4 Tablet By Mouth Daily, As Needed For Elevated Bp. 21)  Nitrostat 0.4 Mg Subl (Nitroglycerin) .Marland Kitchen.. 1 Tablet Under Tongue At Onset of Chest Pain; You May Repeat Every 5 Minutes For Up To 3 Doses. 22)  Metoprolol Tartrate 50 Mg Tabs (Metoprolol Tartrate) .... Take One Tablet By Mouth Twice A Day As Needed For Increased Heartrate 23)  Amoxicillin 250 Mg Caps (Amoxicillin) .... One  Tab By Mouth Three Times A Day 24)  Black Cohosh 540 Mg Caps (Black Cohosh) .... Once Daily 25)  Prilosec 20 Mg Cpdr (Omeprazole) .... Once Daily 26)  Benadryl 25 Mg Tabs (Diphenhydramine Hcl) .... As Needed 27)  Allegra Allergy 180 Mg Tabs (Fexofenadine Hcl) .... As Needed 28)  Diflucan 150 Mg Tabs (Fluconazole) .... Once Daily 29)  Nystatin 100000 Unit/gm Powd (Nystatin) .... Apply To Area Under Breasts Two Times A Day As Needed. 30)  Nystatin 100000 Unit/gm Crea (Nystatin) .... Apply Area Two Times A Day Prn 31)  Cipro 500 Mg Tabs (Ciprofloxacin Hcl) .Marland Kitchen.. 1 By Mouth 2 Times Daily X 10 Days 32)  Metronidazole 500 Mg Tabs (Metronidazole) .Marland Kitchen..  1 Tab By Mouth Two Times A Day X 10 Days  Allergies (verified): No Known Drug Allergies  Past History:  Past Medical History: Last updated: 11/15/2009 Allergic rhinitis Diabetes mellitus, type II Gout Hyperlipidemia Hypertension Low back pain Peripheral neuropathy COPD  Past Surgical History: Last updated: 03/12/2006 Colon polypectomy Hysterectomy Tubal ligation  Family History: Last updated: 01/11/2010 Family History Uterine cancer- mom died at 14 yo Father:deceased-heart problems  Social History: Last updated: 01/11/2010 Married to [employer]. Disabled. Tobacco Use - No.  Alcohol Use - no Regular Exercise - no Drug Use - no  Risk Factors: Exercise: no (01/11/2010)  Risk Factors: Smoking Status: never (01/11/2010)  Review of Systems      See HPI General:  Denies fever. GI:  Complains of abdominal pain and bloody stools; denies constipation, dark tarry stools, and diarrhea.  Physical Exam  General:  Well-developed,well-nourished,in no acute distress; alert,appropriate and cooperative throughout examination. Abdomen:  Bowel sounds positive,abdomen soft and non-tender without masses, organomegaly or hernias noted. Psych:  Oriented X3 and good eye contact.     Impression & Recommendations:  Problem # 1:  RECTAL BLEEDING (ICD-569.3) Assessment New Against my advise, pt is refusing rectal exam or further work up. Wants to be treated for diverticulitis. Currently no red flag symptoms but obviously I would like to examen her properly. ?hemorrhoidal bleeding vs diverticula vs colitis. Will treat with cipro and flagyl but advised at first sign of fever or worsening pain, needs to go to ER. Also if bleeding/pain persists, I will not treat her without a proper exam/work up.  Pt needs a colonoscopy and proper rectal exam.  I only agreed to give her abx today to avoid her becoming acutely ill if this is diverticulitis.  Complete Medication List: 1)   Hydralazine Hcl 25 Mg Tabs (Hydralazine hcl) .... Take one tablet by mouth three times a day, as needed 2)  Nexium 40 Mg Cpdr (Esomeprazole magnesium) .... Take 1 to 2 by mouthdaily 3)  Lyrica 100 Mg Caps (Pregabalin) .... Three times a day 4)  Ambien 10 Mg Tabs (Zolpidem tartrate) .... At bedtime 5)  Topamax 50 Mg Tabs (Topiramate) .... 3 tabs by mouth daily. 6)  Premarin 0.3 Mg Tabs (Estrogens conjugated) .... Once daily 7)  Prodigy Twist Top Lancets 28g Misc (Lancets) .... Use one lancet each day as directed 8)  Voltaren 1 % Gel (Diclofenac sodium) .... Apply four grams four times daily as directed 9)  Proair Hfa 108 (90 Base) Mcg/act Aers (Albuterol sulfate) .... Inhale two puffs four times daily as directed 10)  Spiriva Handihaler 18 Mcg Caps (Tiotropium bromide monohydrate) .... Inhale one capsule with inhalation device each day 11)  Lovastatin 40 Mg Tabs (Lovastatin) .... Take one tablet by mouth at bedtime 12)  Glipizide 5 Mg Tabs (Glipizide) .... Take one tablet by mouth in the morning and then 1/2 tablet by mouth in the afternoon each day 13)  Levothyroxine Sodium 75 Mcg Tabs (Levothyroxine sodium) .... Take one tablet by mouth daily 14)  Fluticasone Propionate 50 Mcg/act Susp (Fluticasone propionate) .... Inhale two sprays each nostril once daily 15)  Prodigy No Coding Blood Gluc Strp (Glucose blood) .... Use to test blood sugar three times daily 16)  Nortriptyline Hcl 50 Mg Caps (Nortriptyline hcl) .... Take one tablet by mouth every night at bedtime 17)  Pataday 0.2 % Soln (Olopatadine hcl) .... One drop into each eye daily 18)  Hydrocodone-acetaminophen 5-325 Mg Tabs (Hydrocodone-acetaminophen) .Marland Kitchen.. 1-2 tab by mouth every 6 hours as needed for pain. 19)  Furosemide 20 Mg Tabs (Furosemide) .Marland Kitchen.. 1 tab by mouth daily as needed. 20)  Lisinopril 20 Mg Tabs (Lisinopril) .... Pt taking 1/4 tablet by mouth daily, as needed for elevated bp. 21)  Nitrostat 0.4 Mg Subl (Nitroglycerin) .Marland Kitchen.. 1  tablet under tongue at onset of chest pain; you may repeat every 5 minutes for up to 3 doses. 22)  Metoprolol Tartrate 50 Mg Tabs (Metoprolol tartrate) .... Take one tablet by mouth twice a day as needed for increased heartrate 23)  Amoxicillin 250 Mg Caps (Amoxicillin) .... One tab by mouth three times a day 24)  Black Cohosh 540 Mg Caps (Black cohosh) .... Once daily 25)  Prilosec 20 Mg Cpdr (Omeprazole) .... Once daily 26)  Benadryl 25 Mg Tabs (Diphenhydramine hcl) .... As needed 27)  Allegra Allergy 180 Mg Tabs (Fexofenadine hcl) .... As needed 28)  Diflucan 150 Mg Tabs (Fluconazole) .... Once daily 29)  Nystatin 100000 Unit/gm Powd (Nystatin) .... Apply to area under breasts two times a day as needed. 30)  Nystatin 100000 Unit/gm Crea (Nystatin) .... Apply area two times a day prn 31)  Cipro 500 Mg Tabs (Ciprofloxacin hcl) .Marland Kitchen.. 1 by mouth 2 times daily x 10 days 32)  Metronidazole 500 Mg Tabs (Metronidazole) .Marland Kitchen.. 1 tab by mouth two times a day x 10 days Prescriptions: METRONIDAZOLE 500 MG TABS (METRONIDAZOLE) 1 tab by mouth two times a day x 10 days  #20 x 0   Entered and Authorized by:   [name] MD   Signed by:   [name] MD on 04/18/2010   Method used:   Electronically to        [facility]* (retail)       [address]       [place], [place]  [address]       Ph: [phone]       Fax: [phone]   RxID:   [id number] CIPRO 500 MG TABS (CIPROFLOXACIN HCL) 1 by mouth 2 times daily x 10 days  #20 x 0   Entered and Authorized by:   [name] MD   Signed by:   [name] MD on 04/18/2010   Method used:   Electronically to        [facility]* (retail)       [address]       [place], [place]  [address]       Ph: [phone]       Fax: [phone]   RxID:   [id number]    Orders Added: 1)  Est. Patient Level IV [884[address]]    Current Allergies (reviewed today): No known allergies

## 2010-04-27 ENCOUNTER — Telehealth: Payer: Self-pay | Admitting: *Deleted

## 2010-04-28 ENCOUNTER — Other Ambulatory Visit: Payer: Self-pay | Admitting: *Deleted

## 2010-04-28 MED ORDER — NYSTATIN 100000 UNIT/GM EX CREA: 1.0000 "application " | TOPICAL_CREAM | Freq: Two times a day (BID) | CUTANEOUS | Status: DC | PRN

## 2010-04-28 MED ORDER — ZOLPIDEM TARTRATE 10 MG PO TABS: 10.0000 mg | ORAL_TABLET | Freq: Every evening | ORAL | Status: DC | PRN

## 2010-04-28 NOTE — Telephone Encounter (Signed)
Rx called to [facility]. 

## 2010-04-28 NOTE — Telephone Encounter (Signed)
Rx sent to [facility] 

## 2010-04-28 NOTE — Telephone Encounter (Signed)
Please enter refill request

## 2010-04-28 NOTE — Telephone Encounter (Signed)
Please make start a refill request for below. Thanks.

## 2010-05-02 ENCOUNTER — Other Ambulatory Visit: Payer: Self-pay | Admitting: *Deleted

## 2010-05-02 MED ORDER — PREGABALIN 100 MG PO CAPS: 100.0000 mg | ORAL_CAPSULE | Freq: Three times a day (TID) | ORAL | Status: DC

## 2010-05-02 NOTE — Telephone Encounter (Signed)
Nikki, has this been taken care of? 

## 2010-05-02 NOTE — Telephone Encounter (Signed)
Rx called to [facility]. 

## 2010-05-03 ENCOUNTER — Ambulatory Visit: Payer: Self-pay | Admitting: Urology

## 2010-05-03 ENCOUNTER — Other Ambulatory Visit: Payer: Self-pay | Admitting: *Deleted

## 2010-05-03 MED ORDER — HYDROCODONE-ACETAMINOPHEN 5-325 MG PO TABS: 1.0000 | ORAL_TABLET | Freq: Four times a day (QID) | ORAL | Status: DC | PRN

## 2010-05-03 MED ORDER — ESOMEPRAZOLE MAGNESIUM 40 MG PO CPDR: DELAYED_RELEASE_CAPSULE | ORAL | Status: DC

## 2010-05-03 MED ORDER — HYDROCODONE-ACETAMINOPHEN 5-325 MG PO TABS: ORAL_TABLET | ORAL | Status: DC

## 2010-05-03 NOTE — Telephone Encounter (Signed)
Rx called to pharmacy

## 2010-05-03 NOTE — Telephone Encounter (Signed)
Baird Lyons from [facility] called because Rx had two different directions.  Patient should be taking 1-2 tablets every 6 hours as needed for pain.  #60 given with 0 refills.

## 2010-05-04 NOTE — Letter (Signed)
Summary: [facility]  [facility]   Imported By: Kassie Mends 04/24/2010 11:43:00  _____________________________________________________________________  External Attachment:    Type:   Image     Comment:   External Document  Appended Document: [facility] referral placed

## 2010-05-04 NOTE — Progress Notes (Signed)
Summary: Reaction to antibiotic  Phone Note Call from Patient   Caller: Patient Call For: [name] MD Summary of Call: Patient called and would like another antibiotic called to [facility].  Dr. Dayton Martes gave her Cipro on 04/18/2010 and patient states that she is itching on her feet and stomach really bad and the medication is making her nauseous.  She stated that Doxycycline has worked well for her in the past.  Please advise.   Initial call taken by: Linde Gillis CMA Duncan Dull),  April 21, 2010 2:21 PM  Follow-up for Phone Call        She should stop the cipro and continue the flagyl.  If she gets worse, she'll need to be seen at ER per [name] advice. Crawford Givens MD  April 21, 2010 4:12 PM   Patient advised as instructed via telephone.  She would like to hear from Dr. Dayton Martes to see if she needs to be on a different antibiotic or if she is ok without anything else.  Patient advised that Dr. Dayton Martes will not be back in the office until Monday.  She is ok with that.  Linde Gillis CMA Duncan Dull)  April 21, 2010 4:31 PM   Additional Follow-up for Phone Call Additional follow up Details #1::        ok to just take the flagyl. [name] MD  April 21, 2010 5:29 PM  Message left on home answering machine advising as instructed.  Additional Follow-up by: Linde Gillis CMA Duncan Dull),  April 24, 2010 8:48 AM

## 2010-05-29 ENCOUNTER — Other Ambulatory Visit: Payer: Self-pay

## 2010-05-29 ENCOUNTER — Other Ambulatory Visit: Payer: Self-pay | Admitting: *Deleted

## 2010-05-29 MED ORDER — HYDRALAZINE HCL 25 MG PO TABS: 25.0000 mg | ORAL_TABLET | Freq: Three times a day (TID) | ORAL | Status: DC | PRN

## 2010-05-29 MED ORDER — HYDROCODONE-ACETAMINOPHEN 5-325 MG PO TABS: ORAL_TABLET | ORAL | Status: DC

## 2010-05-29 MED ORDER — ZOLPIDEM TARTRATE 10 MG PO TABS: 10.0000 mg | ORAL_TABLET | Freq: Every evening | ORAL | Status: DC | PRN

## 2010-05-29 NOTE — Telephone Encounter (Signed)
Rx for Ambien and Hydrocodone called to [facility].

## 2010-06-14 ENCOUNTER — Ambulatory Visit (INDEPENDENT_AMBULATORY_CARE_PROVIDER_SITE_OTHER): Payer: Medicare Other | Admitting: Family Medicine

## 2010-06-14 ENCOUNTER — Encounter: Payer: Self-pay | Admitting: Family Medicine

## 2010-06-14 DIAGNOSIS — E559 Vitamin D deficiency, unspecified: Secondary | ICD-10-CM

## 2010-06-14 DIAGNOSIS — I447 Left bundle-branch block, unspecified: Secondary | ICD-10-CM

## 2010-06-14 DIAGNOSIS — E039 Hypothyroidism, unspecified: Secondary | ICD-10-CM

## 2010-06-14 DIAGNOSIS — E119 Type 2 diabetes mellitus without complications: Secondary | ICD-10-CM

## 2010-06-14 MED ORDER — NORTRIPTYLINE HCL 50 MG PO CAPS: 50.0000 mg | ORAL_CAPSULE | Freq: Every day | ORAL | Status: DC

## 2010-06-14 MED ORDER — PREGABALIN 100 MG PO CAPS: 100.0000 mg | ORAL_CAPSULE | Freq: Three times a day (TID) | ORAL | Status: DC

## 2010-06-14 MED ORDER — TOPIRAMATE 50 MG PO TABS: 50.0000 mg | ORAL_TABLET | Freq: Three times a day (TID) | ORAL | Status: DC

## 2010-06-14 MED ORDER — ESTROGENS CONJUGATED 0.3 MG PO TABS: 0.3000 mg | ORAL_TABLET | Freq: Every day | ORAL | Status: DC

## 2010-06-14 MED ORDER — METOPROLOL TARTRATE 50 MG PO TABS: 50.0000 mg | ORAL_TABLET | Freq: Two times a day (BID) | ORAL | Status: DC

## 2010-06-14 MED ORDER — ZOLPIDEM TARTRATE 10 MG PO TABS: ORAL_TABLET | ORAL | Status: DC

## 2010-06-14 MED ORDER — NITROGLYCERIN 0.4 MG SL SUBL: SUBLINGUAL_TABLET | SUBLINGUAL | Status: DC

## 2010-06-14 MED ORDER — DICLOFENAC EPOLAMINE 1.3 % TD PTCH: 1.0000 | MEDICATED_PATCH | TRANSDERMAL | Status: DC

## 2010-06-14 MED ORDER — HYDROCODONE-ACETAMINOPHEN 5-325 MG PO TABS: ORAL_TABLET | ORAL | Status: DC

## 2010-06-14 MED ORDER — HYDRALAZINE HCL 25 MG PO TABS: 25.0000 mg | ORAL_TABLET | Freq: Three times a day (TID) | ORAL | Status: DC | PRN

## 2010-06-14 NOTE — Assessment & Plan Note (Signed)
Unchanged.  Recheck a1c today.

## 2010-06-14 NOTE — Assessment & Plan Note (Signed)
Recheck TSH given worsening fatigue.

## 2010-06-14 NOTE — Assessment & Plan Note (Signed)
?  deteriorated. Recheck Vit D today.

## 2010-06-14 NOTE — Progress Notes (Signed)
65 yo with multiple medical problems here for follow up.  Less CP, not requiring NTG as often.    DM- CBGs stable.  On Glucotrol 5 mg daily.  CBGs having been running 100-115 fasting.  Checks CBGs once daily. Lab Results  Component Value Date   HGBA1C 6.1 03/15/2010   Chronic pain- managing ok, would like to try Flector patches for her shoulder. Uses her husband's patches from time to time and they help significantly.    Vitamin D deficiency- has been more fatigued lately.  No SOB or LE edema. Denies symptoms of hypo or hyperthyroidism.  The PMH, PSH, Social History, Family History, Medications, and allergies have been reviewed in [facility], and have been updated if relevant.  Review of Systems       See HPI General:  Denies malaise. Eyes:  Denies blurring. ENT:  Denies difficulty swallowing. CV:  Denies chest pain or discomfort. Resp:  Denies shortness of breath, sputum productive, and wheezing. GI:  Denies abdominal pain, bloody stools, and change in bowel habits. GU:  Denies abnormal vaginal bleeding and decreased libido. MS:  Complains of joint pain, joint swelling, and low back pain; denies joint redness and loss of strength. Derm:  Denies rash. Neuro:  Complains of headaches and tingling; denies inability to speak, memory loss, seizures, tremors, visual disturbances, and weakness. Psych:  Denies sense of great danger, suicidal thoughts/plans, thoughts of violence, unusual visions or sounds, and thoughts /plans of harming others. Endo:  Denies cold intolerance and heat intolerance. Heme:  Denies abnormal bruising and bleeding.  Physical Exam BP 130/82  Pulse 88  Temp(Src) 98.2 F (36.8 C) (Oral)  Wt 227 lb 6.4 oz (103.148 kg)  General:  alert and overweight-appearing, no acute distress.   Head:  normocephalic, atraumatic, and no abnormalities observed.   Eyes:  vision grossly intact, pupils equal, pupils round, and pupils reactive to light.   Ears:  R ear normal and L ear  normal.   Nose:  no external deformity.   Mouth:  pharynx pink and moist.   Neck:  No deformities, masses, or tenderness noted. Lungs:  Normal respiratory effort, chest expands symmetrically. Lungs are clear to auscultation, no crackles or wheezes. Heart:  Normal rate and regular rhythm. S1 and S2 normal without gallop, murmur, click, rub or other extra sounds. Abdomen:  Bowel sounds positive,abdomen soft and non-tender without masses, organomegaly or hernias noted. Msk:  gross deformity of left arm/hand. Pulses:  R and L carotid,radial,femoral,dorsalis pedis and posterior tibial pulses are full and equal bilaterally Extremities:  no edema Neurologic:  alert & oriented X3 and gait normal.   Skin:  Intact without suspicious lesions or rashes Psych:  Oriented X3 and good eye contact.

## 2010-06-15 LAB — BASIC METABOLIC PANEL
BUN: 13 mg/dL (ref 6–23)
Creatinine, Ser: 1 mg/dL (ref 0.4–1.2)
GFR: 56.39 mL/min — ABNORMAL LOW (ref >60.00)
Glucose, Bld: 122 mg/dL — ABNORMAL HIGH (ref 70–99)
Potassium: 3.8 mEq/L (ref 3.5–5.1)

## 2010-06-23 ENCOUNTER — Other Ambulatory Visit: Payer: Self-pay | Admitting: *Deleted

## 2010-06-26 MED ORDER — FLUCONAZOLE 150 MG PO TABS: 150.0000 mg | ORAL_TABLET | Freq: Once | ORAL | Status: AC

## 2010-06-26 MED ORDER — HYDROCODONE-ACETAMINOPHEN 5-325 MG PO TABS: ORAL_TABLET | ORAL | Status: DC

## 2010-06-26 NOTE — Telephone Encounter (Signed)
Rx called to [facility]. 

## 2010-06-27 ENCOUNTER — Ambulatory Visit: Payer: Medicare Other | Admitting: Family Medicine

## 2010-06-28 ENCOUNTER — Other Ambulatory Visit: Payer: Self-pay | Admitting: *Deleted

## 2010-06-28 MED ORDER — HYDROCODONE-ACETAMINOPHEN 5-325 MG PO TABS: ORAL_TABLET | ORAL | Status: DC

## 2010-06-28 NOTE — Telephone Encounter (Signed)
Patient and Mariah Reed is asking for more than one refill if possible.  Please advise.

## 2010-06-28 NOTE — Telephone Encounter (Signed)
Patient is requesting more than 1 refill at a time. [facility] is asking if additional refills can be called in.

## 2010-07-11 ENCOUNTER — Ambulatory Visit (INDEPENDENT_AMBULATORY_CARE_PROVIDER_SITE_OTHER): Payer: Medicare Other | Admitting: Family Medicine

## 2010-07-11 ENCOUNTER — Encounter: Payer: Self-pay | Admitting: Family Medicine

## 2010-07-11 VITALS — BP 126/82 | HR 86 | Temp 98.6°F | Wt 227.8 lb

## 2010-07-11 DIAGNOSIS — J329 Chronic sinusitis, unspecified: Secondary | ICD-10-CM

## 2010-07-11 DIAGNOSIS — J019 Acute sinusitis, unspecified: Secondary | ICD-10-CM

## 2010-07-11 DIAGNOSIS — R51 Headache: Secondary | ICD-10-CM

## 2010-07-11 MED ORDER — OXYCODONE HCL 5 MG PO TABS: 5.0000 mg | ORAL_TABLET | ORAL | Status: AC | PRN

## 2010-07-11 MED ORDER — AMOXICILLIN 250 MG PO CAPS: 250.0000 mg | ORAL_CAPSULE | Freq: Three times a day (TID) | ORAL | Status: AC

## 2010-07-11 NOTE — Assessment & Plan Note (Signed)
>  35 min spent with face to face with patient counseling and coordinating care. She wants Tegretol, which I do not feel comfortable prescribing for several reasons that I discussed with Ms. Montminy, including the other medications she is taking and that I am not convinced this is TGN. I would prefer that she sees neuro, which she will think about. I will d/c vicodin, she will try oxycodone 5 mg three times daily as needed for pain. Lyrica should also be helping with TGN symptoms, which she is currently taking. We discussed Gabapentin which she will also consider.

## 2010-07-11 NOTE — Progress Notes (Signed)
65 yo with multiple medical problems here to discuss headaches.  Headaches- Constant, she thinks she has trigeminal neuralgia.  Wants me to prescribe tegretol. Taking Topamax, Lyrica, no improvement. She states that headaches are sharp, right sided, temple to top of head. Has had them for years/  Less CP, not requiring NTG as often.    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 Wants to try something else.  Sinusitis- she does not want to go to ENT again for now but feels her sinuses are awful. Increased sinus pressure, discharge, irritation.  The PMH, PSH, Social History, Family History, Medications, and allergies have been reviewed in [facility], and have been updated if relevant.  Review of Systems       See HPI General:  Denies malaise, pos headaches Eyes:  Denies blurring. ENT:  Denies difficulty swallowing. CV:  Denies chest pain or discomfort. Resp:  Denies shortness of breath, sputum productive, and wheezing. GI:  Denies abdominal pain, bloody stools, and change in bowel habits. GU:  Denies abnormal vaginal bleeding and decreased libido. MS:  Complains of joint pain, joint swelling, and low back pain; denies joint redness and loss of strength. Derm:  Denies rash. Neuro:  Complains of headaches and tingling; denies inability to speak, memory loss, seizures, tremors, visual disturbances, and weakness. Psych:  Denies sense of great danger, suicidal thoughts/plans, thoughts of violence, unusual visions or sounds, and thoughts /plans of harming others. Endo:  Denies cold intolerance and heat intolerance. Heme:  Denies abnormal bruising and bleeding.  Physical Exam BP 126/82  Pulse 86  Temp(Src) 98.6 F (37 C) (Oral)  Wt 227 lb 12.8 oz (103.329 kg)  General:  alert and overweight-appearing, no acute distress.   Head:  normocephalic, atraumatic, and no abnormalities observed.   Eyes:  vision grossly intact, pupils equal, pupils round, and pupils reactive to  light.   Ears:  R ear normal and L ear normal.   Nose:  no external deformity., sinuses edematous and erythematous, non tender to palp.   Mouth:  pharynx pink and moist.   Neck:  No deformities, masses, or tenderness noted. Lungs:  Normal respiratory effort, chest expands symmetrically. Lungs are clear to auscultation, no crackles or wheezes. Heart:  Normal rate and regular rhythm. S1 and S2 normal without gallop, murmur, click, rub or other extra sounds. Abdomen:  Bowel sounds positive,abdomen soft and non-tender without masses, organomegaly or hernias noted. Msk:  gross deformity of left arm/hand. Pulses:  R and L carotid,radial,femoral,dorsalis pedis and posterior tibial pulses are full and equal bilaterally Extremities:  no edema Neurologic:  alert & oriented X3 and gait normal.   Skin:  Intact without suspicious lesions or rashes Psych:  Oriented X3 and good eye contact.

## 2010-07-11 NOTE — Assessment & Plan Note (Signed)
Deteriorated. Will try low dose abx although I explained to her that chronic abx are not the answer, needs to follow up with ENT.

## 2010-07-13 ENCOUNTER — Telehealth: Payer: Self-pay | Admitting: *Deleted

## 2010-07-13 MED ORDER — GABAPENTIN (ONCE-DAILY) 300 MG PO TABS: 1.0000 | ORAL_TABLET | Freq: Every evening | ORAL | Status: DC | PRN

## 2010-07-13 NOTE — Telephone Encounter (Signed)
Rx sent 

## 2010-07-13 NOTE — Telephone Encounter (Signed)
Pt states she has been taking the oxycodone and is doing fine with it, has not had any reactions.  She would like to go ahead and start the gabapentin, wants to pick it up tomorrow.  Please send to [facility]. Continues to have headaches and she said you had wanted to try her on oxycodone first, and then add gabapentin if no reaction to the oxycodone.

## 2010-07-17 ENCOUNTER — Encounter: Payer: Self-pay | Admitting: Family Medicine

## 2010-07-17 ENCOUNTER — Ambulatory Visit (INDEPENDENT_AMBULATORY_CARE_PROVIDER_SITE_OTHER): Payer: Medicare Other | Admitting: Family Medicine

## 2010-07-17 VITALS — BP 126/78 | HR 84 | Temp 98.6°F | Wt 226.0 lb

## 2010-07-17 DIAGNOSIS — R358 Other polyuria: Secondary | ICD-10-CM | POA: Insufficient documentation

## 2010-07-17 DIAGNOSIS — R0789 Other chest pain: Secondary | ICD-10-CM | POA: Insufficient documentation

## 2010-07-17 DIAGNOSIS — R071 Chest pain on breathing: Secondary | ICD-10-CM

## 2010-07-17 LAB — POCT URINALYSIS DIPSTICK
Bilirubin, UA: NEGATIVE
Glucose, UA: NEGATIVE
Ketones, UA: NEGATIVE
Leukocytes, UA: NEGATIVE

## 2010-07-17 NOTE — Progress Notes (Signed)
  Subjective:    Patient ID: Mariah Reed, female    DOB: 08/21/1945, 65 y.o.   MRN: [id number]  HPI CC: UTI?  New pt to me with h/o DM, L arm crippled, diverticulitis, periph neuropathy, hypothyroidism, HTN and HLD presents with:  2-3 wk h/o urinary symptoms.  Pain at urethra, frequency, urgency.  Mild abd discomfort.  Mild back pain.  No f/c/n/v.  No dysuria.  Has been drinking plenty of cranberry juice.  Initially thought improving, then came back.  Reviewed last PCP office visit, currently on amoxicillin 250mg  tid for chronic sinusitis, prescribed last week.  Has not f/u with ENT.  Latest change in meds was vicodin changed to oxycodone.  Also endorses pain in left lateral breast/lateral chest wall, feels sore, wonders if pulled muscle vs boil.  Lab Results  Component Value Date   HGBA1C 6.3 06/14/2010   Medications and allergies reviewed and updated in chart.  PMHx reviewed for relevance.  Review of Systems Per HPI    Objective:   Physical Exam  Nursing note and vitals reviewed. Constitutional: She appears well-developed and well-nourished. No distress.       Left crippled arm  Cardiovascular: Normal rate, regular rhythm, normal heart sounds and intact distal pulses.   No murmur heard. Pulmonary/Chest: Effort normal and breath sounds normal. No respiratory distress. She has no wheezes. She has no rales. She exhibits tenderness.    Abdominal: Soft. Normal appearance and bowel sounds are normal. She exhibits no distension. There is no tenderness. There is no rebound and no CVA tenderness.       obese  Skin: Skin is warm and dry. No rash noted. No erythema.       No fluctuance or induration left lateral chest wall          Assessment & Plan:

## 2010-07-17 NOTE — Assessment & Plan Note (Addendum)
Examined pt with Selena Batten in room.   No evidence of boil/abscess.  No lump/bump on breast. Likely pulled muscle, conservative treatment as per instructions.

## 2010-07-17 NOTE — Assessment & Plan Note (Addendum)
With pain at urethra. UA overall clear, micro ok. Sent culture to eval for infection, however is currently on amoxicillin. If any worsening, would likely switch to cipro for treatment.

## 2010-07-17 NOTE — Patient Instructions (Signed)
Urine looking ok.  Sent culture to ensure no infection.  We will call you with results. For chest wall - looking ok today, may be strain.  Treat with ice/rest, and tylenol. Update [place] if not improving or any change in symptoms. In meantime, push fluids and rest.

## 2010-07-19 LAB — URINE CULTURE: Colony Count: 4000

## 2010-07-20 ENCOUNTER — Telehealth: Payer: Self-pay | Admitting: *Deleted

## 2010-07-20 ENCOUNTER — Telehealth: Payer: Self-pay | Admitting: Family Medicine

## 2010-07-20 NOTE — Telephone Encounter (Signed)
Noted. thaanks.

## 2010-07-20 NOTE — Telephone Encounter (Signed)
Please notify no infection on urine culture.  How is patient feeling?

## 2010-07-20 NOTE — Telephone Encounter (Signed)
Message left notifying patient of normal results and to call with any update.

## 2010-07-20 NOTE — Telephone Encounter (Signed)
Patient called and said she is feeling much better after drinking plenty of water and cranberry juice. She will come back if symptoms return.

## 2010-07-24 ENCOUNTER — Other Ambulatory Visit: Payer: Self-pay | Admitting: *Deleted

## 2010-07-24 MED ORDER — ALBUTEROL SULFATE HFA 108 (90 BASE) MCG/ACT IN AERS: 2.0000 | INHALATION_SPRAY | RESPIRATORY_TRACT | Status: DC | PRN

## 2010-07-24 MED ORDER — OLOPATADINE HCL 0.2 % OP SOLN: 1.0000 [drp] | Freq: Every day | OPHTHALMIC | Status: DC

## 2010-07-24 MED ORDER — TIOTROPIUM BROMIDE MONOHYDRATE 18 MCG IN CAPS: 18.0000 ug | ORAL_CAPSULE | Freq: Every day | RESPIRATORY_TRACT | Status: DC

## 2010-07-24 NOTE — Telephone Encounter (Signed)
Patient was seen on 07/11/10 and was given a rx for oxycodone. Beth at [facility] says that patient called on Saturday asking for a refill because she thought that they had a script there on file. Medication is not on med list. Please advise.

## 2010-07-25 ENCOUNTER — Telehealth: Payer: Self-pay | Admitting: *Deleted

## 2010-07-25 MED ORDER — NYSTATIN 100000 UNIT/GM EX CREA: 1.0000 "application " | TOPICAL_CREAM | Freq: Two times a day (BID) | CUTANEOUS | Status: DC | PRN

## 2010-07-25 MED ORDER — HYDROCODONE-ACETAMINOPHEN 5-325 MG PO TABS: 1.0000 | ORAL_TABLET | Freq: Four times a day (QID) | ORAL | Status: DC | PRN

## 2010-07-25 MED ORDER — DICLOFENAC EPOLAMINE 1.3 % TD PTCH: 1.0000 | MEDICATED_PATCH | TRANSDERMAL | Status: DC

## 2010-07-25 NOTE — Telephone Encounter (Signed)
Rx for Hydrocodone called to [facility].

## 2010-07-25 NOTE — Telephone Encounter (Signed)
I just refilled her hydrocodone and I cannot refill both safely.

## 2010-07-25 NOTE — Telephone Encounter (Signed)
Patient came in stating that she never asked for a refill on her hydrocodone. She says that she thought you had changed her from the hydrocodone to the oxycodone since she has been taking the hydrocodone for so many years. She says that she really prefer to switch to the oxycodone.

## 2010-07-25 NOTE — Telephone Encounter (Signed)
Received a call from [place] at [facility] stating that the directions and quantity of tablets on Mariah Reed Hydrocodone Rx were different than last time.  We only authorized 1 tablet every 6 hours prn pain, with #30 tablets.  The correct directions should be 1-2 tablets by mouth every 6 hour prn pain, #60.  Gave verbal authorization to change Rx and changed it in computer.

## 2010-07-26 MED ORDER — OXYCODONE HCL 5 MG PO CAPS: 5.0000 mg | ORAL_CAPSULE | ORAL | Status: AC | PRN

## 2010-07-26 NOTE — Telephone Encounter (Signed)
Spoke with Rob at [facility] and he stated that patient has not opened the Hydrocodone Rx and he told her that if Dr. Dayton Martes fills the Rx for Oxycodone that he will D/C the Rx for Hydrocodone and fill the new Rx.  Please advise.

## 2010-07-26 NOTE — Telephone Encounter (Signed)
Next refill ok to switch to oxycodone but I cannot fill both at same time.

## 2010-07-26 NOTE — Telephone Encounter (Signed)
Thank you. Rx printed

## 2010-07-26 NOTE — Telephone Encounter (Signed)
Patient advised that Rx is ready for pick up.

## 2010-08-21 ENCOUNTER — Telehealth: Payer: Self-pay | Admitting: *Deleted

## 2010-08-21 NOTE — Telephone Encounter (Signed)
In my box

## 2010-08-21 NOTE — Telephone Encounter (Signed)
Spoke with patient. She does not want the supplies from this company and asked that we not send the form to them.

## 2010-08-21 NOTE — Telephone Encounter (Signed)
Form for diabetic supplies is on your desk. 

## 2010-08-22 ENCOUNTER — Other Ambulatory Visit: Payer: Self-pay | Admitting: *Deleted

## 2010-08-22 MED ORDER — OXYCODONE HCL 5 MG PO TABS: 5.0000 mg | ORAL_TABLET | ORAL | Status: DC | PRN

## 2010-08-22 MED ORDER — OXYCODONE HCL 5 MG PO TABS: 5.0000 mg | ORAL_TABLET | ORAL | Status: AC | PRN

## 2010-08-22 NOTE — Telephone Encounter (Signed)
Rx printed

## 2010-08-22 NOTE — Telephone Encounter (Signed)
Left message on machine at home advising patient that Rx is ready for pick up will be left at front desk. 

## 2010-08-22 NOTE — Telephone Encounter (Signed)
Pt is asking for a refill on oxycodone.  This is not on pt's med list but she says she has been getting it from you.  She will pick script up tomorrow.

## 2010-09-18 ENCOUNTER — Other Ambulatory Visit: Payer: Self-pay | Admitting: *Deleted

## 2010-09-18 ENCOUNTER — Encounter: Payer: Self-pay | Admitting: *Deleted

## 2010-09-18 ENCOUNTER — Encounter: Payer: Self-pay | Admitting: Family Medicine

## 2010-09-18 ENCOUNTER — Ambulatory Visit (INDEPENDENT_AMBULATORY_CARE_PROVIDER_SITE_OTHER): Payer: Medicare Other | Admitting: Family Medicine

## 2010-09-18 VITALS — BP 120/82 | HR 87 | Temp 98.1°F | Wt 225.2 lb

## 2010-09-18 DIAGNOSIS — E119 Type 2 diabetes mellitus without complications: Secondary | ICD-10-CM

## 2010-09-18 DIAGNOSIS — E039 Hypothyroidism, unspecified: Secondary | ICD-10-CM

## 2010-09-18 DIAGNOSIS — I10 Essential (primary) hypertension: Secondary | ICD-10-CM

## 2010-09-18 LAB — BASIC METABOLIC PANEL
BUN: 10 mg/dL (ref 6–23)
Calcium: 9 mg/dL (ref 8.4–10.5)
Creatinine, Ser: 1 mg/dL (ref 0.4–1.2)
GFR: 56.35 mL/min — ABNORMAL LOW (ref >60.00)
Glucose, Bld: 132 mg/dL — ABNORMAL HIGH (ref 70–99)

## 2010-09-18 LAB — TSH: TSH: 1.63 u[IU]/mL (ref 0.35–5.50)

## 2010-09-18 MED ORDER — PREGABALIN 100 MG PO CAPS: 100.0000 mg | ORAL_CAPSULE | Freq: Three times a day (TID) | ORAL | Status: DC

## 2010-09-18 MED ORDER — DICLOFENAC EPOLAMINE 1.3 % TD PTCH: 1.0000 | MEDICATED_PATCH | TRANSDERMAL | Status: DC

## 2010-09-18 MED ORDER — OXYCODONE HCL 5 MG PO TABS: 5.0000 mg | ORAL_TABLET | ORAL | Status: DC | PRN

## 2010-09-18 MED ORDER — HYDRALAZINE HCL 25 MG PO TABS: 25.0000 mg | ORAL_TABLET | Freq: Three times a day (TID) | ORAL | Status: DC | PRN

## 2010-09-18 MED ORDER — ESOMEPRAZOLE MAGNESIUM 40 MG PO CPDR: DELAYED_RELEASE_CAPSULE | ORAL | Status: DC

## 2010-09-18 NOTE — Patient Instructions (Signed)
Good to see you. Please come see me in 3 months. 

## 2010-09-18 NOTE — Progress Notes (Signed)
65 yo with multiple medical problems here for follow up.  DM- CBGs stable.  On Glucotrol 5 mg daily.  CBGs having been running 100-115 fasting.  Checks CBGs once daily. Lab Results  Component Value Date   HGBA1C 6.3 06/14/2010   Chronic pain- managing ok, Oxycodone has helped significantly. Taking care of her husband who is also disabled.  Hypothyroidism- taking Synthroid 75 mcg daily. Noticed that her hair has been falling out more, more hot sweats.  No CP or palpitations. No other symptoms of hypo or hyperthyroidism.  Patient Active Problem List  Diagnoses  . UNSPECIFIED HYPOTHYROIDISM  . DIABETES MELLITUS, TYPE II  . HYPERLIPIDEMIA  . GOUT  . GRIEF REACTION, ACUTE  . PERIPHERAL NEUROPATHY  . HYPERTENSION  . LEFT BUNDLE BRANCH BLOCK  . SINUSITIS, ACUTE  . ACUTE PHARYNGITIS  . ALLERGIC RHINITIS  . COPD  . GERD  . LOW BACK PAIN  . DYSURIA  . MYOCARDIAL INFARCTION, HX OF  . SINUSITIS, CHRONIC  . HEADACHE  . PALPITATIONS  . SHORTNESS OF BREATH  . CHEST PAIN UNSPECIFIED  . ECHOCARDIOGRAM, ABNORMAL  . ELECTROCARDIOGRAM, ABNORMAL  . ATYPICAL FACE PAIN  . RECTAL BLEEDING  . Vitamin D deficiency  . Left-sided chest wall pain  . Polyuria  . Hypothyroidism   Past Medical History  Diagnosis Date  . Allergy   . Diabetes mellitus   . Gout   . Hyperlipidemia   . Hypertension   . COPD (chronic obstructive pulmonary disease)   . Peripheral neuropathy   . Low back pain    Past Surgical History  Procedure Date  . Polypectomy     Colon  . Abdominal hysterectomy   . Tubal ligation    History  Substance Use Topics  . Smoking status: Never Smoker   . Smokeless tobacco: Not on file  . Alcohol Use: No   Family History  Problem Relation Age of Onset  . Uterine cancer Mother    Allergies  Allergen Reactions  . Levaquin Hives   Current Outpatient Prescriptions on File Prior to Visit  Medication Sig Dispense Refill  . albuterol (PROAIR HFA) 108 (90 BASE) MCG/ACT  inhaler Inhale 2 puffs into the lungs every 4 (four) hours as needed.  1 Inhaler  2  . Black Cohosh 540 MG CAPS Take 1 capsule by mouth daily.        . diclofenac (FLECTOR) 1.3 % PTCH Place 1 patch onto the skin as directed.  60 patch  0  . diclofenac sodium (VOLTAREN) 1 % GEL Apply 1 application topically 4 (four) times daily.        . diphenhydrAMINE (BENADRYL) 25 MG tablet Take 25 mg by mouth as needed.        Marland Kitchen estrogens, conjugated, (PREMARIN) 0.3 MG tablet Take 1 tablet (0.3 mg total) by mouth daily.  30 tablet  6  . fexofenadine (ALLEGRA) 180 MG tablet Take 180 mg by mouth as needed.        . fluticasone (FLONASE) 50 MCG/ACT nasal spray 2 sprays by Nasal route daily.        . furosemide (LASIX) 20 MG tablet Take 20 mg by mouth daily as needed.        . Gabapentin, PHN, 300 MG TABS Take 1 tablet by mouth at bedtime as needed.  30 tablet  0  . glipiZIDE (GLUCOTROL) 5 MG tablet Take 5 mg by mouth. Take 1 by mouth in the morning then 1/2 tablet in afternoon each day       .  glucose blood (PRODIGY NO CODING BLOOD GLUC) test strip 1 each by Other route as needed. To test blood sugar 3 times a day       . levothyroxine (SYNTHROID, LEVOTHROID) 75 MCG tablet Take 75 mcg by mouth daily.        Marland Kitchen lisinopril (PRINIVIL,ZESTRIL) 20 MG tablet Take 1/4 tablet by mouth daily as needed for elevated bp       . lovastatin (MEVACOR) 40 MG tablet Take 40 mg by mouth at bedtime.        . metoprolol (LOPRESSOR) 50 MG tablet Take 1 tablet (50 mg total) by mouth 2 (two) times daily. As needed for increased heart rate  60 tablet  6  . nitroGLYCERIN (NITROSTAT) 0.4 MG SL tablet One tablet under tongue at onset of chest pain, you may repeat every 5 minutes for up to 3 doses  90 tablet  3  . nortriptyline (PAMELOR) 50 MG capsule Take 1 capsule (50 mg total) by mouth at bedtime.  30 capsule  6  . nystatin (MYCOSTATIN) cream Apply 1 application topically 2 (two) times daily as needed.  30 g  2  . nystatin (MYCOSTATIN)  powder Apply to area under breast two times a day as needed       . Olopatadine HCl (PATADAY) 0.2 % SOLN Apply 1 drop to eye daily. Apply to both eyes  1 Bottle  3  . omeprazole (PRILOSEC) 20 MG capsule Take 20 mg by mouth daily.        Marland Kitchen PRODIGY TWIST TOP LANCETS 28G MISC by Does not apply route as needed.        . tiotropium (SPIRIVA HANDIHALER) 18 MCG inhalation capsule Place 1 capsule (18 mcg total) into inhaler and inhale daily.  30 capsule  2  . topiramate (TOPAMAX) 50 MG tablet Take 1 tablet (50 mg total) by mouth 3 (three) times daily.  180 tablet  3  . zolpidem (AMBIEN) 10 MG tablet Take 1 tablet (10 mg total) by mouth at bedtime as needed.  30 tablet  0  . zolpidem (AMBIEN) 10 MG tablet Take one tablet by mouth at bedtime  30 tablet  5     The PMH, PSH, Social History, Family History, Medications, and allergies have been reviewed in [facility], and have been updated if relevant.  Review of Systems       See HPI General:  Denies malaise. Eyes:  Denies blurring. ENT:  Denies difficulty swallowing. CV:  Denies chest pain or discomfort. Resp:  Denies shortness of breath, sputum productive, and wheezing. GI:  Denies abdominal pain, bloody stools, and change in bowel habits. GU:  Denies abnormal vaginal bleeding and decreased libido. MS:  Complains of joint pain, joint swelling, and low back pain; denies joint redness and loss of strength. Derm:  Denies rash. Neuro:  denies inability to speak, memory loss, seizures, tremors, visual disturbances, and weakness. Psych:  Denies sense of great danger, suicidal thoughts/plans, thoughts of violence, unusual visions or sounds, and thoughts /plans of harming others. Endo:  Denies cold intolerance and heat intolerance. Heme:  Denies abnormal bruising and bleeding.  Physical Exam BP 120/82  Pulse 87  Temp 98.1 F (36.7 C)  Wt 225 lb 4 oz (102.173 kg)  General:  alert and overweight-appearing, no acute distress.   Head:  normocephalic,  atraumatic, and no abnormalities observed.   Eyes:  vision grossly intact, pupils equal, pupils round, and pupils reactive to light.   Ears:  R ear  normal and L ear normal.   Nose:  no external deformity.   Mouth:  pharynx pink and moist.   Neck:  No deformities, masses, or tenderness noted. Lungs:  Normal respiratory effort, chest expands symmetrically. Lungs are clear to auscultation, no crackles or wheezes. Heart:  Normal rate and regular rhythm. S1 and S2 normal without gallop, murmur, click, rub or other extra sounds. Abdomen:  Bowel sounds positive,abdomen soft and non-tender without masses, organomegaly or hernias noted. Msk:  gross deformity of left arm/hand. Pulses:  R and L carotid,radial,femoral,dorsalis pedis and posterior tibial pulses are full and equal bilaterally Extremities:  no edema Neurologic:  alert & oriented X3 and gait normal.   Skin:  Intact without suspicious lesions or rashes Psych:  Oriented X3 and good eye contact.    Assessment and Plan: 1. Unspecified hypothyroidism   Possibly deteriorated. Orders Placed This Encounter  Procedures  . TSH  . HgB A1c  . Basic Metabolic Panel (BMET)     2. HYPERTENSION  Stable.  Recheck labs today.  3. DIABETES MELLITUS, TYPE II        Stable.  See above.

## 2010-09-19 MED ORDER — ESTROGENS CONJUGATED 0.625 MG PO TABS: 0.6250 mg | ORAL_TABLET | Freq: Every day | ORAL | Status: DC

## 2010-09-19 NOTE — Progress Notes (Signed)
Addended by: Dianne Dun on: 09/19/2010 07:22 AM   Modules accepted: Orders

## 2010-09-25 ENCOUNTER — Encounter: Payer: Self-pay | Admitting: Family Medicine

## 2010-09-25 ENCOUNTER — Ambulatory Visit (INDEPENDENT_AMBULATORY_CARE_PROVIDER_SITE_OTHER): Payer: Medicare Other | Admitting: Family Medicine

## 2010-09-25 ENCOUNTER — Other Ambulatory Visit: Payer: Self-pay | Admitting: *Deleted

## 2010-09-25 VITALS — BP 132/86 | HR 89 | Temp 98.8°F | Wt 225.2 lb

## 2010-09-25 DIAGNOSIS — J019 Acute sinusitis, unspecified: Secondary | ICD-10-CM

## 2010-09-25 MED ORDER — OLOPATADINE HCL 0.2 % OP SOLN: 1.0000 [drp] | Freq: Every day | OPHTHALMIC | Status: DC

## 2010-09-25 MED ORDER — LEVOTHYROXINE SODIUM 75 MCG PO TABS: 75.0000 ug | ORAL_TABLET | Freq: Every day | ORAL | Status: DC

## 2010-09-25 MED ORDER — FUROSEMIDE 20 MG PO TABS: 20.0000 mg | ORAL_TABLET | Freq: Every day | ORAL | Status: DC | PRN

## 2010-09-25 MED ORDER — DICLOFENAC EPOLAMINE 1.3 % TD PTCH: 1.0000 | MEDICATED_PATCH | TRANSDERMAL | Status: DC

## 2010-09-25 MED ORDER — ZOLPIDEM TARTRATE 10 MG PO TABS: 10.0000 mg | ORAL_TABLET | Freq: Every evening | ORAL | Status: DC | PRN

## 2010-09-25 MED ORDER — GLIPIZIDE 5 MG PO TABS: ORAL_TABLET | ORAL | Status: DC

## 2010-09-25 MED ORDER — LOVASTATIN 40 MG PO TABS: 40.0000 mg | ORAL_TABLET | Freq: Every day | ORAL | Status: DC

## 2010-09-25 MED ORDER — ALBUTEROL SULFATE HFA 108 (90 BASE) MCG/ACT IN AERS: 2.0000 | INHALATION_SPRAY | RESPIRATORY_TRACT | Status: DC | PRN

## 2010-09-25 MED ORDER — PREGABALIN 100 MG PO CAPS: 100.0000 mg | ORAL_CAPSULE | Freq: Three times a day (TID) | ORAL | Status: DC

## 2010-09-25 MED ORDER — FLUTICASONE PROPIONATE 50 MCG/ACT NA SUSP: 2.0000 | Freq: Every day | NASAL | Status: DC

## 2010-09-25 MED ORDER — NYSTATIN 100000 UNIT/GM EX CREA: 1.0000 "application " | TOPICAL_CREAM | Freq: Two times a day (BID) | CUTANEOUS | Status: DC | PRN

## 2010-09-25 MED ORDER — TIOTROPIUM BROMIDE MONOHYDRATE 18 MCG IN CAPS: 18.0000 ug | ORAL_CAPSULE | Freq: Every day | RESPIRATORY_TRACT | Status: DC

## 2010-09-25 MED ORDER — AZITHROMYCIN 250 MG PO TABS: ORAL_TABLET | ORAL | Status: AC

## 2010-09-25 NOTE — Telephone Encounter (Signed)
Patient request 90 day supply

## 2010-09-25 NOTE — Progress Notes (Signed)
65 yo with multiple medical problems well known to me here for ?sinusitis.  Has chronic issues with sinusitis, tries to avoid abx when possible.  Last two weeks, taking Benadryl and allegra D with no improvement in sinus pressure. Felt a little feverish last night. No cough, SOB or CP. No increased WOB.  No rashes.  Patient Active Problem List  Diagnoses  . UNSPECIFIED HYPOTHYROIDISM  . DIABETES MELLITUS, TYPE II  . HYPERLIPIDEMIA  . GOUT  . GRIEF REACTION, ACUTE  . PERIPHERAL NEUROPATHY  . HYPERTENSION  . LEFT BUNDLE BRANCH BLOCK  . SINUSITIS, ACUTE  . ACUTE PHARYNGITIS  . ALLERGIC RHINITIS  . COPD  . GERD  . LOW BACK PAIN  . DYSURIA  . MYOCARDIAL INFARCTION, HX OF  . SINUSITIS, CHRONIC  . HEADACHE  . PALPITATIONS  . SHORTNESS OF BREATH  . CHEST PAIN UNSPECIFIED  . ECHOCARDIOGRAM, ABNORMAL  . ELECTROCARDIOGRAM, ABNORMAL  . ATYPICAL FACE PAIN  . RECTAL BLEEDING  . Vitamin D deficiency  . Left-sided chest wall pain  . Polyuria  . Hypothyroidism   Past Medical History  Diagnosis Date  . Allergy   . Diabetes mellitus   . Gout   . Hyperlipidemia   . Hypertension   . COPD (chronic obstructive pulmonary disease)   . Peripheral neuropathy   . Low back pain    Past Surgical History  Procedure Date  . Polypectomy     Colon  . Abdominal hysterectomy   . Tubal ligation    History  Substance Use Topics  . Smoking status: Never Smoker   . Smokeless tobacco: Not on file  . Alcohol Use: No   Family History  Problem Relation Age of Onset  . Uterine cancer Mother    Allergies  Allergen Reactions  . Levaquin Hives   Current Outpatient Prescriptions on File Prior to Visit  Medication Sig Dispense Refill  . albuterol (PROAIR HFA) 108 (90 BASE) MCG/ACT inhaler Inhale 2 puffs into the lungs every 4 (four) hours as needed.  1 Inhaler  2  . Black Cohosh 540 MG CAPS Take 1 capsule by mouth daily.        . diclofenac (FLECTOR) 1.3 % PTCH Place 1 patch onto the  skin as directed.  60 patch  0  . diclofenac sodium (VOLTAREN) 1 % GEL Apply 1 application topically 4 (four) times daily.        . diphenhydrAMINE (BENADRYL) 25 MG tablet Take 25 mg by mouth as needed.        Marland Kitchen esomeprazole (NEXIUM) 40 MG capsule 1-2 caps by mouth daily  60 capsule  6  . estrogens, conjugated, (PREMARIN) 0.625 MG tablet Take 1 tablet (0.625 mg total) by mouth daily.  30 tablet  6  . fexofenadine (ALLEGRA) 180 MG tablet Take 180 mg by mouth as needed.        . fluticasone (FLONASE) 50 MCG/ACT nasal spray 2 sprays by Nasal route daily.        . furosemide (LASIX) 20 MG tablet Take 20 mg by mouth daily as needed.        . Gabapentin, PHN, 300 MG TABS Take 1 tablet by mouth at bedtime as needed.  30 tablet  0  . glipiZIDE (GLUCOTROL) 5 MG tablet Take 5 mg by mouth. Take 1 by mouth in the morning then 1/2 tablet in afternoon each day       . glucose blood (PRODIGY NO CODING BLOOD GLUC) test strip 1 each by  Other route as needed. To test blood sugar 3 times a day       . hydrALAZINE (APRESOLINE) 25 MG tablet Take 1 tablet (25 mg total) by mouth 3 (three) times daily as needed.  180 tablet  3  . levothyroxine (SYNTHROID, LEVOTHROID) 75 MCG tablet Take 75 mcg by mouth daily.        Marland Kitchen lisinopril (PRINIVIL,ZESTRIL) 20 MG tablet Take 1/4 tablet by mouth daily as needed for elevated bp       . lovastatin (MEVACOR) 40 MG tablet Take 40 mg by mouth at bedtime.        . metoprolol (LOPRESSOR) 50 MG tablet Take 1 tablet (50 mg total) by mouth 2 (two) times daily. As needed for increased heart rate  60 tablet  6  . nitroGLYCERIN (NITROSTAT) 0.4 MG SL tablet One tablet under tongue at onset of chest pain, you may repeat every 5 minutes for up to 3 doses  90 tablet  3  . nortriptyline (PAMELOR) 50 MG capsule Take 1 capsule (50 mg total) by mouth at bedtime.  30 capsule  6  . nystatin (MYCOSTATIN) cream Apply 1 application topically 2 (two) times daily as needed.  30 g  2  . nystatin (MYCOSTATIN)  powder Apply to area under breast two times a day as needed       . Olopatadine HCl (PATADAY) 0.2 % SOLN Apply 1 drop to eye daily. Apply to both eyes  1 Bottle  3  . omeprazole (PRILOSEC) 20 MG capsule Take 20 mg by mouth daily.        Marland Kitchen oxyCODONE (OXY IR/ROXICODONE) 5 MG immediate release tablet Take 1 tablet (5 mg total) by mouth every 4 (four) hours as needed for pain.  90 tablet  0  . pregabalin (LYRICA) 100 MG capsule Take 1 capsule (100 mg total) by mouth 3 (three) times daily.  90 capsule  3  . PRODIGY TWIST TOP LANCETS 28G MISC by Does not apply route as needed.        . tiotropium (SPIRIVA HANDIHALER) 18 MCG inhalation capsule Place 1 capsule (18 mcg total) into inhaler and inhale daily.  30 capsule  2  . topiramate (TOPAMAX) 50 MG tablet Take 1 tablet (50 mg total) by mouth 3 (three) times daily.  180 tablet  3  . zolpidem (AMBIEN) 10 MG tablet Take 1 tablet (10 mg total) by mouth at bedtime as needed.  30 tablet  0  . zolpidem (AMBIEN) 10 MG tablet Take one tablet by mouth at bedtime  30 tablet  5     The PMH, PSH, Social History, Family History, Medications, and allergies have been reviewed in [facility], and have been updated if relevant.  Review of Systems       See HPI  Physical Exam BP 132/86  Pulse 89  Temp(Src) 98.8 F (37.1 C) (Oral)  Wt 225 lb 4 oz (102.173 kg)  General:  alert and overweight-appearing, no acute distress.   Head:  normocephalic, atraumatic, and no abnormalities observed.   Eyes:  vision grossly intact, pupils equal, pupils round, and pupils reactive to light.   Ears:  R ear normal and L ear normal.   Nose:  Swollen turbinates, sinuses TTP throughout   Mouth: pos pharyngeal erythema, no exudate Neck:  No deformities, masses, or tenderness noted. Lungs:  Normal respiratory effort, chest expands symmetrically. Lungs are clear to auscultation, no crackles or wheezes. Heart:  Normal rate and regular rhythm. S1 and  S2 normal without gallop, murmur, click, rub  or other extra sounds. Abdomen:  Bowel sounds positive,abdomen soft and non-tender without masses, organomegaly or hernias noted. Msk:  gross deformity of left arm/hand. Extremities:  no edema Neurologic:  alert & oriented X3 and gait normal.   Skin:  Intact without suspicious lesions or rashes Psych:  Oriented X3 and good eye contact.    Assessment and Plan: 1. SINUSITIS, ACUTE   New. Given duration and progression of symptoms, will treat for bacterial sinusitis with Zpack. Continue supportive care with Benadryl and as needed allegra D.

## 2010-09-25 NOTE — Telephone Encounter (Signed)
Rx for Flector patch, Zolpidem, and Lyrica called to [facility].  All others were sent electronically.

## 2010-09-26 ENCOUNTER — Telehealth: Payer: Self-pay | Admitting: *Deleted

## 2010-09-26 NOTE — Telephone Encounter (Signed)
Patient advised as instructed via telephone. 

## 2010-09-26 NOTE — Telephone Encounter (Signed)
She does not need a prescription. OTC vitamin D- 1000 IU daily.

## 2010-09-26 NOTE — Telephone Encounter (Signed)
Pt states she is ready to start prescription vitamin D, wants this sent to [facility].  She says she will try to cut these in half and hopes that they wont bother her stomach.  She continues to feel tired and thinks that low vitamin D might be the reason.

## 2010-11-13 ENCOUNTER — Other Ambulatory Visit: Payer: Self-pay | Admitting: *Deleted

## 2010-11-13 NOTE — Telephone Encounter (Signed)
Last refill 10/18/2010. 

## 2010-11-14 MED ORDER — DICLOFENAC EPOLAMINE 1.3 % TD PTCH: 1.0000 | MEDICATED_PATCH | TRANSDERMAL | Status: DC

## 2010-11-17 ENCOUNTER — Encounter: Payer: Self-pay | Admitting: Family Medicine

## 2010-11-17 ENCOUNTER — Ambulatory Visit (INDEPENDENT_AMBULATORY_CARE_PROVIDER_SITE_OTHER): Payer: Medicare Other | Admitting: Family Medicine

## 2010-11-17 VITALS — BP 130/80 | HR 88 | Temp 98.3°F | Ht 66.0 in | Wt 228.0 lb

## 2010-11-17 DIAGNOSIS — J329 Chronic sinusitis, unspecified: Secondary | ICD-10-CM

## 2010-11-17 MED ORDER — AZITHROMYCIN 250 MG PO TABS: ORAL_TABLET | ORAL | Status: AC

## 2010-11-17 NOTE — Patient Instructions (Signed)
Take antibiotic as directed.  Drink lots of fluids.  Treat sympotmatically with Benadryl and Tylenol/Ibuprofen. You can use warm compresses.  Cough suppressant at night. Call if not improving as expected in 5-7 days.

## 2010-11-17 NOTE — Progress Notes (Signed)
65 yo with multiple medical problems well known to me here for ?sinusitis.  Has chronic issues with sinusitis, tries to avoid abx when possible.  Last two weeks, taking Benadryl and allegra D with no improvement in sinus pressure. Two days ago, started feeling very feverish and "worn out.'  No cough, SOB or CP. No increased WOB.  No rashes.  Patient Active Problem List  Diagnoses  . UNSPECIFIED HYPOTHYROIDISM  . DIABETES MELLITUS, TYPE II  . HYPERLIPIDEMIA  . GOUT  . GRIEF REACTION, ACUTE  . PERIPHERAL NEUROPATHY  . HYPERTENSION  . LEFT BUNDLE BRANCH BLOCK  . SINUSITIS, ACUTE  . ACUTE PHARYNGITIS  . ALLERGIC RHINITIS  . COPD  . GERD  . LOW BACK PAIN  . DYSURIA  . MYOCARDIAL INFARCTION, HX OF  . SINUSITIS, CHRONIC  . HEADACHE  . PALPITATIONS  . SHORTNESS OF BREATH  . CHEST PAIN UNSPECIFIED  . ECHOCARDIOGRAM, ABNORMAL  . ELECTROCARDIOGRAM, ABNORMAL  . ATYPICAL FACE PAIN  . RECTAL BLEEDING  . Vitamin D deficiency  . Left-sided chest wall pain  . Polyuria  . Hypothyroidism   Past Medical History  Diagnosis Date  . Allergy   . Diabetes mellitus   . Gout   . Hyperlipidemia   . Hypertension   . COPD (chronic obstructive pulmonary disease)   . Peripheral neuropathy   . Low back pain    Past Surgical History  Procedure Date  . Polypectomy     Colon  . Abdominal hysterectomy   . Tubal ligation    History  Substance Use Topics  . Smoking status: Never Smoker   . Smokeless tobacco: Not on file  . Alcohol Use: No   Family History  Problem Relation Age of Onset  . Uterine cancer Mother    Allergies  Allergen Reactions  . Levaquin Hives   Current Outpatient Prescriptions on File Prior to Visit  Medication Sig Dispense Refill  . albuterol (PROAIR HFA) 108 (90 BASE) MCG/ACT inhaler Inhale 2 puffs into the lungs every 4 (four) hours as needed.  1 Inhaler  6  . Black Cohosh 540 MG CAPS Take 1 capsule by mouth daily.        . diclofenac (FLECTOR) 1.3 % PTCH  Place 1 patch onto the skin as directed.  60 patch  0  . diphenhydrAMINE (BENADRYL) 25 MG tablet Take 25 mg by mouth as needed.        Marland Kitchen esomeprazole (NEXIUM) 40 MG capsule 1-2 caps by mouth daily  60 capsule  6  . estrogens, conjugated, (PREMARIN) 0.625 MG tablet Take 1 tablet (0.625 mg total) by mouth daily.  30 tablet  6  . fexofenadine (ALLEGRA) 180 MG tablet Take 180 mg by mouth as needed.        . fluticasone (FLONASE) 50 MCG/ACT nasal spray Place 2 sprays into the nose daily.  16 g  6  . furosemide (LASIX) 20 MG tablet Take 1 tablet (20 mg total) by mouth daily as needed.  90 tablet  3  . glipiZIDE (GLUCOTROL) 5 MG tablet Take 1 by mouth in the morning then 1/2 tablet in afternoon each day  180 tablet  3  . glucose blood (PRODIGY NO CODING BLOOD GLUC) test strip 1 each by Other route as needed. To test blood sugar 3 times a day       . hydrALAZINE (APRESOLINE) 25 MG tablet Take 1 tablet (25 mg total) by mouth 3 (three) times daily as needed.  180 tablet  3  . levothyroxine (SYNTHROID, LEVOTHROID) 75 MCG tablet Take 1 tablet (75 mcg total) by mouth daily.  90 tablet  3  . lovastatin (MEVACOR) 40 MG tablet Take 1 tablet (40 mg total) by mouth at bedtime.  90 tablet  3  . metoprolol (LOPRESSOR) 50 MG tablet Take 1 tablet (50 mg total) by mouth 2 (two) times daily. As needed for increased heart rate  60 tablet  6  . nitroGLYCERIN (NITROSTAT) 0.4 MG SL tablet One tablet under tongue at onset of chest pain, you may repeat every 5 minutes for up to 3 doses  90 tablet  3  . nortriptyline (PAMELOR) 50 MG capsule Take 1 capsule (50 mg total) by mouth at bedtime.  30 capsule  6  . nystatin (MYCOSTATIN) cream Apply 1 application topically 2 (two) times daily as needed.  30 g  3  . nystatin (MYCOSTATIN) powder Apply to area under breast two times a day as needed       . Olopatadine HCl (PATADAY) 0.2 % SOLN Apply 1 drop to eye daily. Apply to both eyes  1 Bottle  3  . omeprazole (PRILOSEC) 20 MG capsule  Take 20 mg by mouth daily.        . pregabalin (LYRICA) 100 MG capsule Take 1 capsule (100 mg total) by mouth 3 (three) times daily.  270 capsule  3  . PRODIGY TWIST TOP LANCETS 28G MISC by Does not apply route as needed.        . tiotropium (SPIRIVA HANDIHALER) 18 MCG inhalation capsule Place 1 capsule (18 mcg total) into inhaler and inhale daily.  90 capsule  3  . topiramate (TOPAMAX) 50 MG tablet Take 1 tablet (50 mg total) by mouth 3 (three) times daily.  180 tablet  3  . zolpidem (AMBIEN) 10 MG tablet Take one tablet by mouth at bedtime  30 tablet  5     The PMH, PSH, Social History, Family History, Medications, and allergies have been reviewed in [facility], and have been updated if relevant.  Review of Systems       See HPI  Physical Exam BP 130/80  Pulse 88  Temp(Src) 98.3 F (36.8 C) (Oral)  Ht 5\' 6"  (1.676 m)  Wt 228 lb (103.42 kg)  BMI 36.80 kg/m2  General:  alert and overweight-appearing, no acute distress.   Head:  normocephalic, atraumatic, and no abnormalities observed.   Eyes:  vision grossly intact, pupils equal, pupils round, and pupils reactive to light.   Ears:  R ear normal and L ear normal.   Nose:  Swollen turbinates, sinuses TTP throughout   Mouth: pos pharyngeal erythema, no exudate Neck:  No deformities, masses, or tenderness noted. Lungs:  Normal respiratory effort, chest expands symmetrically. Lungs are clear to auscultation, no crackles or wheezes. Heart:  Normal rate and regular rhythm. S1 and S2 normal without gallop, murmur, click, rub or other extra sounds. Abdomen:  Bowel sounds positive,abdomen soft and non-tender without masses, organomegaly or hernias noted. Msk:  gross deformity of left arm/hand. Extremities:  no edema Neurologic:  alert & oriented X3 and gait normal.   Skin:  Intact without suspicious lesions or rashes Psych:  Oriented X3 and good eye contact.    Assessment and Plan: 1. SINUSITIS, CHRONIC   New. Given duration and progression  of symptoms, will treat for bacterial sinusitis with Zpack. Continue supportive care with Benadryl and as needed allegra D.

## 2010-12-04 ENCOUNTER — Other Ambulatory Visit: Payer: Self-pay | Admitting: *Deleted

## 2010-12-04 MED ORDER — FLUCONAZOLE 150 MG PO TABS: 150.0000 mg | ORAL_TABLET | Freq: Once | ORAL | Status: AC

## 2010-12-04 NOTE — Telephone Encounter (Signed)
Pt is requesting refill on diflucan, faxed request has come from [facility].  Last filled 06/26/10.

## 2010-12-04 NOTE — Telephone Encounter (Signed)
Ok to refill x 1.  If symptoms persist, needs to be seen.

## 2010-12-04 NOTE — Telephone Encounter (Signed)
Medication sent to pharmacy  

## 2010-12-18 ENCOUNTER — Encounter: Payer: Self-pay | Admitting: Family Medicine

## 2010-12-18 ENCOUNTER — Ambulatory Visit (INDEPENDENT_AMBULATORY_CARE_PROVIDER_SITE_OTHER): Payer: Medicare Other | Admitting: Family Medicine

## 2010-12-18 VITALS — BP 126/82 | HR 84 | Temp 97.6°F | Ht 66.0 in | Wt 228.5 lb

## 2010-12-18 DIAGNOSIS — I10 Essential (primary) hypertension: Secondary | ICD-10-CM

## 2010-12-18 DIAGNOSIS — E119 Type 2 diabetes mellitus without complications: Secondary | ICD-10-CM

## 2010-12-18 DIAGNOSIS — Z23 Encounter for immunization: Secondary | ICD-10-CM

## 2010-12-18 DIAGNOSIS — E039 Hypothyroidism, unspecified: Secondary | ICD-10-CM

## 2010-12-18 MED ORDER — HYDRALAZINE HCL 25 MG PO TABS: 25.0000 mg | ORAL_TABLET | Freq: Three times a day (TID) | ORAL | Status: DC | PRN

## 2010-12-18 MED ORDER — TOPIRAMATE 50 MG PO TABS: 50.0000 mg | ORAL_TABLET | Freq: Three times a day (TID) | ORAL | Status: DC

## 2010-12-18 MED ORDER — ZOLPIDEM TARTRATE 10 MG PO TABS: ORAL_TABLET | ORAL | Status: DC

## 2010-12-18 MED ORDER — ESTROGENS CONJUGATED 0.625 MG PO TABS: 0.6250 mg | ORAL_TABLET | Freq: Every day | ORAL | Status: DC

## 2010-12-18 MED ORDER — NORTRIPTYLINE HCL 50 MG PO CAPS: 50.0000 mg | ORAL_CAPSULE | Freq: Every day | ORAL | Status: DC

## 2010-12-18 MED ORDER — NITROGLYCERIN 0.4 MG SL SUBL: SUBLINGUAL_TABLET | SUBLINGUAL | Status: DC

## 2010-12-18 MED ORDER — OXYCODONE HCL 5 MG PO TABS: 5.0000 mg | ORAL_TABLET | ORAL | Status: AC | PRN

## 2010-12-18 MED ORDER — OXYCODONE HCL 5 MG PO TABS: 5.0000 mg | ORAL_TABLET | ORAL | Status: DC | PRN

## 2010-12-18 MED ORDER — METOPROLOL TARTRATE 50 MG PO TABS: 50.0000 mg | ORAL_TABLET | Freq: Two times a day (BID) | ORAL | Status: DC

## 2010-12-18 MED ORDER — ESOMEPRAZOLE MAGNESIUM 40 MG PO CPDR: DELAYED_RELEASE_CAPSULE | ORAL | Status: DC

## 2010-12-18 MED ORDER — FLUTICASONE PROPIONATE 50 MCG/ACT NA SUSP: 2.0000 | Freq: Every day | NASAL | Status: DC

## 2010-12-18 MED ORDER — PREGABALIN 100 MG PO CAPS: 100.0000 mg | ORAL_CAPSULE | Freq: Three times a day (TID) | ORAL | Status: DC

## 2010-12-18 MED ORDER — OLOPATADINE HCL 0.2 % OP SOLN: OPHTHALMIC | Status: DC

## 2010-12-18 NOTE — Patient Instructions (Addendum)
Have a wonderful Christmas. Please come back to see me in 3 months.

## 2010-12-18 NOTE — Progress Notes (Signed)
65 yo with multiple medical problems here for follow up.  DM- CBGs stable.  On Glucotrol 5 mg daily.  CBGs having been running 100-115 fasting.  Checks CBGs once daily. Lab Results  Component Value Date   HGBA1C 6.4 09/18/2010   Chronic pain- managing ok, Oxycodone and nortriptyline have helped.   Taking care of her husband who is also disabled.  Vaginal dryness/hair falling out- Thyroid function has been stable so started premarin several months ago. All of her symptoms have improved. (she is s/p hysterectomy).   Patient Active Problem List  Diagnoses  . UNSPECIFIED HYPOTHYROIDISM  . DIABETES MELLITUS, TYPE II  . HYPERLIPIDEMIA  . GOUT  . GRIEF REACTION, ACUTE  . PERIPHERAL NEUROPATHY  . HYPERTENSION  . LEFT BUNDLE BRANCH BLOCK  . SINUSITIS, ACUTE  . ACUTE PHARYNGITIS  . ALLERGIC RHINITIS  . COPD  . GERD  . LOW BACK PAIN  . DYSURIA  . MYOCARDIAL INFARCTION, HX OF  . SINUSITIS, CHRONIC  . HEADACHE  . PALPITATIONS  . SHORTNESS OF BREATH  . CHEST PAIN UNSPECIFIED  . ECHOCARDIOGRAM, ABNORMAL  . ELECTROCARDIOGRAM, ABNORMAL  . ATYPICAL FACE PAIN  . RECTAL BLEEDING  . Vitamin D deficiency  . Left-sided chest wall pain  . Polyuria  . Hypothyroidism   Past Medical History  Diagnosis Date  . Allergy   . Diabetes mellitus   . Gout   . Hyperlipidemia   . Hypertension   . COPD (chronic obstructive pulmonary disease)   . Peripheral neuropathy   . Low back pain    Past Surgical History  Procedure Date  . Polypectomy     Colon  . Abdominal hysterectomy   . Tubal ligation    History  Substance Use Topics  . Smoking status: Never Smoker   . Smokeless tobacco: Not on file  . Alcohol Use: No   Family History  Problem Relation Age of Onset  . Uterine cancer Mother    Allergies  Allergen Reactions  . Levaquin Hives   Current Outpatient Prescriptions on File Prior to Visit  Medication Sig Dispense Refill  . albuterol (PROAIR HFA) 108 (90 BASE) MCG/ACT inhaler  Inhale 2 puffs into the lungs every 4 (four) hours as needed.  1 Inhaler  6  . Black Cohosh 540 MG CAPS Take 1 capsule by mouth daily.        . diclofenac (FLECTOR) 1.3 % PTCH Place 1 patch onto the skin as directed.  60 patch  0  . diphenhydrAMINE (BENADRYL) 25 MG tablet Take 25 mg by mouth as needed.        Marland Kitchen esomeprazole (NEXIUM) 40 MG capsule 1-2 caps by mouth daily  60 capsule  6  . estrogens, conjugated, (PREMARIN) 0.625 MG tablet Take 1 tablet (0.625 mg total) by mouth daily.  30 tablet  6  . fexofenadine (ALLEGRA) 180 MG tablet Take 180 mg by mouth as needed.        . fluticasone (FLONASE) 50 MCG/ACT nasal spray Place 2 sprays into the nose daily.  16 g  6  . furosemide (LASIX) 20 MG tablet Take 1 tablet (20 mg total) by mouth daily as needed.  90 tablet  3  . glipiZIDE (GLUCOTROL) 5 MG tablet Take 1 by mouth in the morning then 1/2 tablet in afternoon each day  180 tablet  3  . glucose blood (PRODIGY NO CODING BLOOD GLUC) test strip 1 each by Other route as needed. To test blood sugar 3 times a  day       . hydrALAZINE (APRESOLINE) 25 MG tablet Take 1 tablet (25 mg total) by mouth 3 (three) times daily as needed.  180 tablet  3  . levothyroxine (SYNTHROID, LEVOTHROID) 75 MCG tablet Take 1 tablet (75 mcg total) by mouth daily.  90 tablet  3  . lovastatin (MEVACOR) 40 MG tablet Take 1 tablet (40 mg total) by mouth at bedtime.  90 tablet  3  . metoprolol (LOPRESSOR) 50 MG tablet Take 1 tablet (50 mg total) by mouth 2 (two) times daily. As needed for increased heart rate  60 tablet  6  . nitroGLYCERIN (NITROSTAT) 0.4 MG SL tablet One tablet under tongue at onset of chest pain, you may repeat every 5 minutes for up to 3 doses  90 tablet  3  . nortriptyline (PAMELOR) 50 MG capsule Take 1 capsule (50 mg total) by mouth at bedtime.  30 capsule  6  . nystatin (MYCOSTATIN) cream Apply 1 application topically 2 (two) times daily as needed.  30 g  3  . nystatin (MYCOSTATIN) powder Apply to area under  breast two times a day as needed       . Olopatadine HCl (PATADAY) 0.2 % SOLN Apply 1 drop to eye daily. Apply to both eyes  1 Bottle  3  . omeprazole (PRILOSEC) 20 MG capsule Take 20 mg by mouth daily.        . pregabalin (LYRICA) 100 MG capsule Take 1 capsule (100 mg total) by mouth 3 (three) times daily.  270 capsule  3  . PRODIGY TWIST TOP LANCETS 28G MISC by Does not apply route as needed.        . tiotropium (SPIRIVA HANDIHALER) 18 MCG inhalation capsule Place 1 capsule (18 mcg total) into inhaler and inhale daily.  90 capsule  3  . topiramate (TOPAMAX) 50 MG tablet Take 1 tablet (50 mg total) by mouth 3 (three) times daily.  180 tablet  3  . zolpidem (AMBIEN) 10 MG tablet Take one tablet by mouth at bedtime  30 tablet  5     The PMH, PSH, Social History, Family History, Medications, and allergies have been reviewed in [facility], and have been updated if relevant.  Review of Systems       See HPI General:  Denies malaise. Eyes:  Denies blurring. ENT:  Denies difficulty swallowing. Resp:  Denies shortness of breath, sputum productive, and wheezing. Neuro:  denies inability to speak, memory loss, seizures, tremors, visual disturbances, and weakness. Psych:  Denies sense of great danger, suicidal thoughts/plans, thoughts of violence, unusual visions or sounds, and thoughts /plans of harming others. Endo:  Denies cold intolerance and heat intolerance. Heme:  Denies abnormal bruising and bleeding.  Physical Exam BP 126/82  Pulse 84  Temp(Src) 97.6 F (36.4 C) (Oral)  Ht 5\' 6"  (1.676 m)  Wt 228 lb 8 oz (103.647 kg)  BMI 36.88 kg/m2  General:  alert and overweight-appearing, no acute distress.   Head:  normocephalic, atraumatic, and no abnormalities observed.   Eyes:  vision grossly intact, pupils equal, pupils round, and pupils reactive to light.   Ears:  R ear normal and L ear normal.   Nose:  no external deformity.   Mouth:  pharynx pink and moist.   Neck:  No deformities, masses,  or tenderness noted. Lungs:  Normal respiratory effort, chest expands symmetrically. Lungs are clear to auscultation, no crackles or wheezes. Heart:  Normal rate and regular rhythm. S1 and  S2 normal without gallop, murmur, click, rub or other extra sounds. Abdomen:  Bowel sounds positive,abdomen soft and non-tender without masses, organomegaly or hernias noted. Msk:  gross deformity of left arm/hand. Pulses:  R and L carotid,radial,femoral,dorsalis pedis and posterior tibial pulses are full and equal bilaterally Extremities:  no edema Neurologic:  alert & oriented X3 and gait normal.   Skin:  Intact without suspicious lesions or rashes Psych:  Oriented X3 and good eye contact.    Assessment and Plan: 1. Unspecified hypothyroidism   Stable.  Continue current dose of synthroid.   2. HYPERTENSION  Stable.    3. DIABETES MELLITUS, TYPE II        Stable.

## 2010-12-21 ENCOUNTER — Ambulatory Visit: Payer: Medicare Other | Admitting: Family Medicine

## 2010-12-21 ENCOUNTER — Other Ambulatory Visit: Payer: Self-pay | Admitting: *Deleted

## 2010-12-21 MED ORDER — FLUCONAZOLE 150 MG PO TABS: 150.0000 mg | ORAL_TABLET | Freq: Once | ORAL | Status: DC

## 2011-01-01 ENCOUNTER — Telehealth: Payer: Self-pay | Admitting: *Deleted

## 2011-01-01 MED ORDER — PRODIGY TWIST TOP LANCETS 28G MISC: Status: DC

## 2011-01-01 MED ORDER — GLUCOSE BLOOD VI STRP: ORAL_STRIP | Status: DC

## 2011-01-01 NOTE — Telephone Encounter (Signed)
Patient called this morning and left a message with Aram Beecham to have me return her call.  Called patient on home phone number at left a message for her to call me at her convenience.

## 2011-01-01 NOTE — Telephone Encounter (Signed)
Spoke with patient via telephone and she request 30 day supply of test strips and lancets be sent to [facility].  She stated that she no longer wishes to use the mail order company.  Rx's sent to [facility].

## 2011-02-08 ENCOUNTER — Other Ambulatory Visit: Payer: Self-pay | Admitting: *Deleted

## 2011-02-08 MED ORDER — NYSTATIN 100000 UNIT/GM EX CREA: 1.0000 "application " | TOPICAL_CREAM | Freq: Two times a day (BID) | CUTANEOUS | Status: DC | PRN

## 2011-02-08 MED ORDER — ACCU-CHEK FASTCLIX LANCETS MISC: 1.0000 | Freq: Three times a day (TID) | Status: AC

## 2011-02-12 ENCOUNTER — Other Ambulatory Visit: Payer: Self-pay | Admitting: *Deleted

## 2011-02-12 MED ORDER — GLUCOSE BLOOD VI STRP: ORAL_STRIP | Status: DC

## 2011-02-23 ENCOUNTER — Telehealth: Payer: Self-pay | Admitting: Family Medicine

## 2011-02-23 NOTE — Telephone Encounter (Signed)
Pt called, request call back from [place].  Pt has questions request a form she is filling out of medication... Call back # [phone]..cyd

## 2011-02-27 NOTE — Telephone Encounter (Signed)
Patient came by the office to drop off a form to be completed by Dr. Dayton Martes so she can get her insurance to cover the cost of her Premarin Rx.  Form completed and in your IN box.

## 2011-03-01 NOTE — Telephone Encounter (Signed)
Form faxed to [phone] [facility]

## 2011-03-01 NOTE — Telephone Encounter (Signed)
In my box

## 2011-03-05 ENCOUNTER — Telehealth: Payer: Self-pay | Admitting: Family Medicine

## 2011-03-05 MED ORDER — HYDROXYZINE PAMOATE 25 MG PO CAPS: 25.0000 mg | ORAL_CAPSULE | Freq: Two times a day (BID) | ORAL | Status: DC | PRN

## 2011-03-05 NOTE — Telephone Encounter (Signed)
Patient notified that rx has been sent per Dr. Dayton Martes.

## 2011-03-05 NOTE — Telephone Encounter (Signed)
Triage Record Num: [id number] Operator: Freddie Breech Patient Name: Mariah Reed Call Date & Time: 03/05/2011 10:26:00AM Patient Phone: [phone] PCP: [name] Patient Gender: Female PCP Fax : [phone] Patient DOB: [birth year] Practice Name: [facility] Reason for Call: Caller: Liam/Patient is calling with a question about Visteril.The medication was written by [name] Nestor Ramp). Pt is alternating 3 antihistamines "for years" for sinus congestion. Post nasal drip is so bad she's hacking and spitting. Emergent sx r/o. RN advised see in 24 hrs per Allergy Sx Protocol. Pt has an appt on 03/21/11 for a FU. RN advised pt she may need an appt for a new medication but she declines an appt. She would like a to see if Dr. Dayton Martes will call in a short course of Vistaril to try before her appt as she's taken this in the past. OFC NOTE: PLS CALL PT AT [phone]. Protocol(s) Used: Allergies Recommended Outcome per Protocol: See Provider within 24 hours Override Outcome if Used in Protocol: Information Noted and Sent to Office RN Reason for Override Outcome: Per [employer]. Reason for Outcome: Being treated by provider for allergies AND symptoms not responding or are worsening with prescribed treatment in expected timeframe Care Advice: ~ 03/05/2011 10:58:32AM Page 1 of 1 [id number]

## 2011-03-05 NOTE — Telephone Encounter (Signed)
Rx sent 

## 2011-03-12 ENCOUNTER — Other Ambulatory Visit: Payer: Self-pay | Admitting: *Deleted

## 2011-03-12 MED ORDER — HYDROXYZINE PAMOATE 25 MG PO CAPS: 25.0000 mg | ORAL_CAPSULE | Freq: Two times a day (BID) | ORAL | Status: DC | PRN

## 2011-03-14 ENCOUNTER — Telehealth: Payer: Self-pay | Admitting: *Deleted

## 2011-03-14 MED ORDER — HYDROXYZINE PAMOATE 50 MG PO CAPS: 50.0000 mg | ORAL_CAPSULE | Freq: Three times a day (TID) | ORAL | Status: AC | PRN

## 2011-03-14 NOTE — Telephone Encounter (Signed)
Rx for 50 mg tablets sent to her pharmacy.

## 2011-03-14 NOTE — Telephone Encounter (Signed)
Received a fax from pharmacy stating that patient is taking 25mg  of Hydroxyzine but is requesting 50mg .  Please advise.

## 2011-03-21 ENCOUNTER — Encounter: Payer: Self-pay | Admitting: Family Medicine

## 2011-03-21 ENCOUNTER — Ambulatory Visit (INDEPENDENT_AMBULATORY_CARE_PROVIDER_SITE_OTHER): Payer: Medicare Other | Admitting: Family Medicine

## 2011-03-21 VITALS — BP 132/72 | HR 68 | Temp 97.6°F | Wt 228.5 lb

## 2011-03-21 DIAGNOSIS — E119 Type 2 diabetes mellitus without complications: Secondary | ICD-10-CM

## 2011-03-21 DIAGNOSIS — G609 Hereditary and idiopathic neuropathy, unspecified: Secondary | ICD-10-CM

## 2011-03-21 DIAGNOSIS — E039 Hypothyroidism, unspecified: Secondary | ICD-10-CM

## 2011-03-21 DIAGNOSIS — M545 Low back pain: Secondary | ICD-10-CM

## 2011-03-21 DIAGNOSIS — F4321 Adjustment disorder with depressed mood: Secondary | ICD-10-CM

## 2011-03-21 DIAGNOSIS — I10 Essential (primary) hypertension: Secondary | ICD-10-CM

## 2011-03-21 MED ORDER — NITROGLYCERIN 0.4 MG SL SUBL: SUBLINGUAL_TABLET | SUBLINGUAL | Status: DC

## 2011-03-21 MED ORDER — TIOTROPIUM BROMIDE MONOHYDRATE 18 MCG IN CAPS: 18.0000 ug | ORAL_CAPSULE | Freq: Every day | RESPIRATORY_TRACT | Status: DC

## 2011-03-21 MED ORDER — ZOLPIDEM TARTRATE 10 MG PO TABS: ORAL_TABLET | ORAL | Status: DC

## 2011-03-21 MED ORDER — ESTROGENS CONJUGATED 0.625 MG PO TABS: 0.6250 mg | ORAL_TABLET | Freq: Every day | ORAL | Status: DC

## 2011-03-21 MED ORDER — HYDRALAZINE HCL 25 MG PO TABS: 25.0000 mg | ORAL_TABLET | Freq: Three times a day (TID) | ORAL | Status: DC | PRN

## 2011-03-21 MED ORDER — OXYCODONE HCL 10 MG PO TABS: 10.0000 mg | ORAL_TABLET | Freq: Two times a day (BID) | ORAL | Status: DC | PRN

## 2011-03-21 MED ORDER — ESOMEPRAZOLE MAGNESIUM 40 MG PO CPDR: DELAYED_RELEASE_CAPSULE | ORAL | Status: DC

## 2011-03-21 MED ORDER — LOVASTATIN 40 MG PO TABS: 40.0000 mg | ORAL_TABLET | Freq: Every day | ORAL | Status: DC

## 2011-03-21 MED ORDER — FLUTICASONE PROPIONATE 50 MCG/ACT NA SUSP: 2.0000 | Freq: Every day | NASAL | Status: DC

## 2011-03-21 MED ORDER — PREGABALIN 100 MG PO CAPS: 100.0000 mg | ORAL_CAPSULE | Freq: Three times a day (TID) | ORAL | Status: DC

## 2011-03-21 MED ORDER — OLOPATADINE HCL 0.2 % OP SOLN: OPHTHALMIC | Status: DC

## 2011-03-21 MED ORDER — METOPROLOL TARTRATE 50 MG PO TABS: 50.0000 mg | ORAL_TABLET | Freq: Two times a day (BID) | ORAL | Status: DC

## 2011-03-21 NOTE — Patient Instructions (Signed)
Good to see you. Try the aquafor or eucerin ointment for dried skin. Please come see me in 3 months.

## 2011-03-21 NOTE — Progress Notes (Signed)
66 yo with multiple medical problems here for follow up.  DM- CBGs stable.  On Glucotrol 5 mg daily.  CBGs having been running 100-115 fasting. No CBGs higher than 200.  Checks CBGs once daily. Lab Results  Component Value Date   HGBA1C 6.4 09/18/2010   Chronic pain- managing ok, Oxycodone and nortriptyline have helped.  Wants to try 10 mg oxycodone twice daily instead of 5 mg every 4-6 hours. Taking care of her husband who is also disabled.  Vaginal dryness/hair falling out- Thyroid function has been stable so started premarin several months ago. All of her symptoms have improved. (she is s/p hysterectomy).  CP- less episodes of CP.  Takes NTG as needed.  Patient Active Problem List  Diagnoses  . UNSPECIFIED HYPOTHYROIDISM  . DIABETES MELLITUS, TYPE II  . HYPERLIPIDEMIA  . GOUT  . GRIEF REACTION, ACUTE  . PERIPHERAL NEUROPATHY  . HYPERTENSION  . LEFT BUNDLE BRANCH BLOCK  . SINUSITIS, ACUTE  . ACUTE PHARYNGITIS  . ALLERGIC RHINITIS  . COPD  . GERD  . LOW BACK PAIN  . DYSURIA  . MYOCARDIAL INFARCTION, HX OF  . SINUSITIS, CHRONIC  . HEADACHE  . PALPITATIONS  . SHORTNESS OF BREATH  . CHEST PAIN UNSPECIFIED  . ECHOCARDIOGRAM, ABNORMAL  . ELECTROCARDIOGRAM, ABNORMAL  . ATYPICAL FACE PAIN  . RECTAL BLEEDING  . Vitamin D deficiency  . Left-sided chest wall pain  . Polyuria  . Hypothyroidism   Past Medical History  Diagnosis Date  . Allergy   . Diabetes mellitus   . Gout   . Hyperlipidemia   . Hypertension   . COPD (chronic obstructive pulmonary disease)   . Peripheral neuropathy   . Low back pain    Past Surgical History  Procedure Date  . Polypectomy     Colon  . Abdominal hysterectomy   . Tubal ligation    History  Substance Use Topics  . Smoking status: Never Smoker   . Smokeless tobacco: Not on file  . Alcohol Use: No   Family History  Problem Relation Age of Onset  . Uterine cancer Mother    Allergies  Allergen Reactions  . Levaquin Hives    Current Outpatient Prescriptions on File Prior to Visit  Medication Sig Dispense Refill  . ACCU-CHEK FASTCLIX LANCETS MISC 1 Device by Does not apply route 3 (three) times daily. Use to check blood sugar up to three times a day  102 each  12  . albuterol (PROAIR HFA) 108 (90 BASE) MCG/ACT inhaler Inhale 2 puffs into the lungs every 4 (four) hours as needed.  1 Inhaler  6  . Black Cohosh 540 MG CAPS Take 1 capsule by mouth daily.        . diclofenac (FLECTOR) 1.3 % PTCH Place 1 patch onto the skin as directed.  60 patch  0  . diphenhydrAMINE (BENADRYL) 25 MG tablet Take 25 mg by mouth as needed.        Marland Kitchen esomeprazole (NEXIUM) 40 MG capsule 1-2 caps by mouth daily  180 capsule  3  . estrogens, conjugated, (PREMARIN) 0.625 MG tablet Take 1 tablet (0.625 mg total) by mouth daily.  90 tablet  3  . fexofenadine (ALLEGRA) 180 MG tablet Take 180 mg by mouth as needed.        . fluconazole (DIFLUCAN) 150 MG tablet Take 1 tablet (150 mg total) by mouth once.  1 tablet  1  . fluticasone (FLONASE) 50 MCG/ACT nasal spray Place 2 sprays  into the nose daily.  16 g  6  . furosemide (LASIX) 20 MG tablet Take 1 tablet (20 mg total) by mouth daily as needed.  90 tablet  3  . glipiZIDE (GLUCOTROL) 5 MG tablet Take 1 by mouth in the morning then 1/2 tablet in afternoon each day  180 tablet  3  . glucose blood (ACCU-CHEK AVIVA PLUS) test strip Use to test blood sugar up to three times daily  100 each  12  . glucose blood (PRODIGY NO CODING BLOOD GLUC) test strip To test blood sugar 3 times a day  100 each  12  . hydrALAZINE (APRESOLINE) 25 MG tablet Take 1 tablet (25 mg total) by mouth 3 (three) times daily as needed.  270 tablet  3  . hydrOXYzine (VISTARIL) 50 MG capsule Take 1 capsule (50 mg total) by mouth 3 (three) times daily as needed for itching.  90 capsule  0  . levothyroxine (SYNTHROID, LEVOTHROID) 75 MCG tablet Take 1 tablet (75 mcg total) by mouth daily.  90 tablet  3  . lovastatin (MEVACOR) 40 MG  tablet Take 1 tablet (40 mg total) by mouth at bedtime.  90 tablet  3  . metoprolol (LOPRESSOR) 50 MG tablet Take 1 tablet (50 mg total) by mouth 2 (two) times daily. As needed for increased heart rate  180 tablet  3  . nitroGLYCERIN (NITROSTAT) 0.4 MG SL tablet One tablet under tongue at onset of chest pain, you may repeat every 5 minutes for up to 3 doses  90 tablet  3  . nortriptyline (PAMELOR) 50 MG capsule Take 1 capsule (50 mg total) by mouth at bedtime.  30 capsule  6  . nystatin cream (MYCOSTATIN) Apply 1 application topically 2 (two) times daily as needed.  30 g  3  . Olopatadine HCl (PATADAY) 0.2 % SOLN Apply one drop to each eye daily  1 Bottle  3  . omeprazole (PRILOSEC) 20 MG capsule Take 20 mg by mouth daily.        . pregabalin (LYRICA) 100 MG capsule Take 1 capsule (100 mg total) by mouth 3 (three) times daily.  270 capsule  3  . tiotropium (SPIRIVA HANDIHALER) 18 MCG inhalation capsule Place 1 capsule (18 mcg total) into inhaler and inhale daily.  90 capsule  3  . topiramate (TOPAMAX) 50 MG tablet Take 1 tablet (50 mg total) by mouth 3 (three) times daily.  270 tablet  3  . zolpidem (AMBIEN) 10 MG tablet Take one tablet by mouth at bedtime  90 tablet  3     The PMH, PSH, Social History, Family History, Medications, and allergies have been reviewed in [facility], and have been updated if relevant.  Review of Systems       See HPI General:  Denies malaise. Eyes:  Denies blurring. ENT:  Denies difficulty swallowing. Resp:  Denies shortness of breath, sputum productive, and wheezing. Neuro:  denies inability to speak, memory loss, seizures, tremors, visual disturbances, and weakness. Psych:  Denies sense of great danger, suicidal thoughts/plans, thoughts of violence, unusual visions or sounds, and thoughts /plans of harming others. Endo:  Denies cold intolerance and heat intolerance. Heme:  Denies abnormal bruising and bleeding.  Physical Exam BP 132/72  Pulse 68  Temp(Src) 97.6  F (36.4 C) (Oral)  Wt 228 lb 8 oz (103.647 kg)  General:  alert and overweight-appearing, no acute distress.   Head:  normocephalic, atraumatic, and no abnormalities observed.   Eyes:  vision  grossly intact, pupils equal, pupils round, and pupils reactive to light.   Ears:  R ear normal and L ear normal.   Nose:  no external deformity.   Mouth:  pharynx pink and moist.   Neck:  No deformities, masses, or tenderness noted. Lungs:  Normal respiratory effort, chest expands symmetrically. Lungs are clear to auscultation, no crackles or wheezes. Heart:  Normal rate and regular rhythm. S1 and S2 normal without gallop, murmur, click, rub or other extra sounds. Abdomen:  Bowel sounds positive,abdomen soft and non-tender without masses, organomegaly or hernias noted. Msk:  gross deformity of left arm/hand. Pulses:  R and L carotid,radial,femoral,dorsalis pedis and posterior tibial pulses are full and equal bilaterally Extremities:  no edema Neurologic:  alert & oriented X3 and gait normal.   Skin:  Intact without suspicious lesions or rashes Psych:  Oriented X3 and good eye contact.    Assessment and Plan: 1. DM Stable. Continue current meds.  2. Chronic pain Deteriorated. Will increase dose of Oxycodone.   Discussed sedation precautions. The patient indicates understanding of these issues and agrees with the plan.   3. PERIPHERAL NEUROPATHY  Stable.  Continue Lyrica.  4. HYPERTENSION  Stable on current meds.

## 2011-04-02 ENCOUNTER — Telehealth: Payer: Self-pay | Admitting: *Deleted

## 2011-04-02 NOTE — Telephone Encounter (Signed)
Received prior authorization for Hydroxyzine, coverage valid through 02/05/2012.  Form sent to be scanned.

## 2011-04-04 ENCOUNTER — Other Ambulatory Visit: Payer: Self-pay | Admitting: *Deleted

## 2011-04-04 MED ORDER — FLUCONAZOLE 150 MG PO TABS: 150.0000 mg | ORAL_TABLET | Freq: Once | ORAL | Status: DC

## 2011-04-04 NOTE — Telephone Encounter (Signed)
Last refill 01/08/2011.  Ok to refill?

## 2011-05-25 ENCOUNTER — Telehealth: Payer: Self-pay | Admitting: Family Medicine

## 2011-05-25 ENCOUNTER — Telehealth: Payer: Self-pay

## 2011-05-25 ENCOUNTER — Ambulatory Visit: Payer: Medicare Other | Admitting: Family Medicine

## 2011-05-25 MED ORDER — AMOXICILLIN 250 MG PO CAPS: 250.0000 mg | ORAL_CAPSULE | Freq: Three times a day (TID) | ORAL | Status: AC

## 2011-05-25 MED ORDER — MOMETASONE FUROATE 0.1 % EX CREA: TOPICAL_CREAM | Freq: Every day | CUTANEOUS | Status: AC

## 2011-05-25 NOTE — Telephone Encounter (Signed)
Spoke with pt.  It is hard for her to get transportation to come back in.  Same issues that she has been having with her sinuses- sinus pressure, productive cough. No current CP or SOB.  She is not currently wheezing.  Had a low grade temp this morning.  Agreed to send in rx for abx and ok not to come in for appt.  If symptoms worsen, she is aware that she needs to go to the ER.

## 2011-05-25 NOTE — Telephone Encounter (Signed)
Pt walked in with h/a and burning in head, head congestion,dry mouth (lips cracked in corners),productive cough yellow-green phlegm, some wheezing but pt said that is normal for her due to COPD, no trouble breathing,and heart hurts on and off; no chest pain now.Pt does not appear in any distress. Pt offered appt today with Dr Aron(Dr Dayton Martes not in office until this afternoon) at 1:30pm. Pt states she needs to be seen now. Dr Para March said if pt cannot wait for appt to go to ER. Pt made appt to see Dr Dayton Martes today at 1:30pm.

## 2011-05-28 ENCOUNTER — Telehealth: Payer: Self-pay | Admitting: *Deleted

## 2011-05-28 MED ORDER — AZITHROMYCIN 250 MG PO TABS: ORAL_TABLET | ORAL | Status: AC

## 2011-05-28 NOTE — Telephone Encounter (Signed)
Pt has been taking amox and now has yeast infection around breasts and neck.  She want to change to z- pack.  She declined anything for yeast.

## 2011-05-28 NOTE — Telephone Encounter (Signed)
Ok to send zpack to pharmacy with no refills.

## 2011-06-05 ENCOUNTER — Other Ambulatory Visit: Payer: Self-pay | Admitting: *Deleted

## 2011-06-05 MED ORDER — FLUCONAZOLE 150 MG PO TABS: 150.0000 mg | ORAL_TABLET | Freq: Once | ORAL | Status: DC

## 2011-06-05 MED ORDER — ALBUTEROL SULFATE HFA 108 (90 BASE) MCG/ACT IN AERS: 2.0000 | INHALATION_SPRAY | RESPIRATORY_TRACT | Status: DC | PRN

## 2011-06-05 MED ORDER — NYSTATIN 100000 UNIT/GM EX CREA: 1.0000 "application " | TOPICAL_CREAM | Freq: Two times a day (BID) | CUTANEOUS | Status: DC | PRN

## 2011-06-05 NOTE — Telephone Encounter (Signed)
Faxed requests from [facility], last filled 05/07/11.

## 2011-06-18 ENCOUNTER — Ambulatory Visit (INDEPENDENT_AMBULATORY_CARE_PROVIDER_SITE_OTHER): Payer: Medicare Other | Admitting: Family Medicine

## 2011-06-18 ENCOUNTER — Encounter: Payer: Self-pay | Admitting: Family Medicine

## 2011-06-18 VITALS — BP 140/90 | HR 88 | Temp 98.1°F | Wt 226.0 lb

## 2011-06-18 DIAGNOSIS — E119 Type 2 diabetes mellitus without complications: Secondary | ICD-10-CM

## 2011-06-18 DIAGNOSIS — I10 Essential (primary) hypertension: Secondary | ICD-10-CM | POA: Diagnosis not present

## 2011-06-18 DIAGNOSIS — R110 Nausea: Secondary | ICD-10-CM

## 2011-06-18 LAB — COMPREHENSIVE METABOLIC PANEL
BUN: 11 mg/dL (ref 6–23)
CO2: 24 mEq/L (ref 19–32)
Calcium: 8.8 mg/dL (ref 8.4–10.5)
Chloride: 99 mEq/L (ref 96–112)
Creatinine, Ser: 1 mg/dL (ref 0.4–1.2)
GFR: 60.93 mL/min (ref >60.00)
Total Bilirubin: 0.4 mg/dL (ref 0.3–1.2)

## 2011-06-18 MED ORDER — ESTROGENS CONJUGATED 0.625 MG PO TABS: 90.0000 mg | ORAL_TABLET | Freq: Every day | ORAL | Status: DC

## 2011-06-18 MED ORDER — ESOMEPRAZOLE MAGNESIUM 40 MG PO CPDR: DELAYED_RELEASE_CAPSULE | ORAL | Status: DC

## 2011-06-18 MED ORDER — TIOTROPIUM BROMIDE MONOHYDRATE 18 MCG IN CAPS: 18.0000 ug | ORAL_CAPSULE | Freq: Every day | RESPIRATORY_TRACT | Status: DC

## 2011-06-18 MED ORDER — OLOPATADINE HCL 0.2 % OP SOLN: OPHTHALMIC | Status: DC

## 2011-06-18 MED ORDER — NITROGLYCERIN 0.4 MG SL SUBL: SUBLINGUAL_TABLET | SUBLINGUAL | Status: DC

## 2011-06-18 MED ORDER — OXYCODONE HCL 10 MG PO TABS: 10.0000 mg | ORAL_TABLET | Freq: Two times a day (BID) | ORAL | Status: DC | PRN

## 2011-06-18 MED ORDER — GLIPIZIDE 5 MG PO TABS: ORAL_TABLET | ORAL | Status: DC

## 2011-06-18 MED ORDER — LOVASTATIN 40 MG PO TABS: 40.0000 mg | ORAL_TABLET | Freq: Every day | ORAL | Status: DC

## 2011-06-18 MED ORDER — LEVOTHYROXINE SODIUM 75 MCG PO TABS: 75.0000 ug | ORAL_TABLET | Freq: Every day | ORAL | Status: DC

## 2011-06-18 MED ORDER — FLUTICASONE PROPIONATE 50 MCG/ACT NA SUSP: 2.0000 | Freq: Every day | NASAL | Status: DC

## 2011-06-18 MED ORDER — ONDANSETRON HCL 4 MG PO TABS: 4.0000 mg | ORAL_TABLET | Freq: Three times a day (TID) | ORAL | Status: AC | PRN

## 2011-06-18 NOTE — Patient Instructions (Signed)
Good to see you. I have called in Zofran to your pharmacy. Please call me if you do not feel better in next several days. We will call you with your lab results.

## 2011-06-18 NOTE — Progress Notes (Signed)
Addended by: Eliezer Bottom on: 06/18/2011 11:24 AM   Modules accepted: Orders

## 2011-06-18 NOTE — Progress Notes (Signed)
66 yo with multiple medical problems here for follow up.  DM- CBGs stable.  On Glucotrol 5 mg daily.  CBGs having been running 100-115 fasting. No CBGs higher than 200.  Checks CBGs once daily. Lab Results  Component Value Date   HGBA1C 6.4 09/18/2010   Chronic pain- managing ok, Oxycodone and nortriptyline have helped.  Taking care of her husband who is also disabled.  Nausea- has been more nauseated when she gets her migraines.  Allergies/sinus drainage seem to trigger her migraines this time of year. Taking Benadryl as needed for nausea which has helped in past but not past several days. No vomiting. No diarrhea. No fevers or chills.  CP- less episodes of CP.  Takes NTG as needed.  Patient Active Problem List  Diagnoses  . UNSPECIFIED HYPOTHYROIDISM  . DIABETES MELLITUS, TYPE II  . HYPERLIPIDEMIA  . GOUT  . GRIEF REACTION, ACUTE  . PERIPHERAL NEUROPATHY  . HYPERTENSION  . LEFT BUNDLE BRANCH BLOCK  . SINUSITIS, ACUTE  . ACUTE PHARYNGITIS  . ALLERGIC RHINITIS  . COPD  . GERD  . LOW BACK PAIN  . DYSURIA  . MYOCARDIAL INFARCTION, HX OF  . SINUSITIS, CHRONIC  . HEADACHE  . PALPITATIONS  . SHORTNESS OF BREATH  . CHEST PAIN UNSPECIFIED  . ECHOCARDIOGRAM, ABNORMAL  . ELECTROCARDIOGRAM, ABNORMAL  . ATYPICAL FACE PAIN  . RECTAL BLEEDING  . Vitamin D deficiency  . Left-sided chest wall pain  . Polyuria  . Hypothyroidism   Past Medical History  Diagnosis Date  . Allergy   . Diabetes mellitus   . Gout   . Hyperlipidemia   . Hypertension   . COPD (chronic obstructive pulmonary disease)   . Peripheral neuropathy   . Low back pain    Past Surgical History  Procedure Date  . Polypectomy     Colon  . Abdominal hysterectomy   . Tubal ligation    History  Substance Use Topics  . Smoking status: Never Smoker   . Smokeless tobacco: Not on file  . Alcohol Use: No   Family History  Problem Relation Age of Onset  . Uterine cancer Mother    Allergies  Allergen  Reactions  . Levofloxacin Hives   Current Outpatient Prescriptions on File Prior to Visit  Medication Sig Dispense Refill  . ACCU-CHEK FASTCLIX LANCETS MISC 1 Device by Does not apply route 3 (three) times daily. Use to check blood sugar up to three times a day  102 each  12  . albuterol (PROAIR HFA) 108 (90 BASE) MCG/ACT inhaler Inhale 2 puffs into the lungs every 4 (four) hours as needed.  1 Inhaler  6  . Black Cohosh 540 MG CAPS Take 1 capsule by mouth daily.        . diclofenac (FLECTOR) 1.3 % PTCH Place 1 patch onto the skin as directed.  60 patch  0  . diphenhydrAMINE (BENADRYL) 25 MG tablet Take 25 mg by mouth as needed.        Marland Kitchen esomeprazole (NEXIUM) 40 MG capsule 1-2 caps by mouth daily  60 capsule  3  . estrogens, conjugated, (PREMARIN) 0.625 MG tablet Take 1 tablet (0.625 mg total) by mouth daily.  30 tablet  3  . fexofenadine (ALLEGRA) 180 MG tablet Take 180 mg by mouth as needed.        . fluconazole (DIFLUCAN) 150 MG tablet Take 1 tablet (150 mg total) by mouth once.  1 tablet  1  . fluticasone (FLONASE) 50  MCG/ACT nasal spray Place 2 sprays into the nose daily.  16 g  3  . furosemide (LASIX) 20 MG tablet Take 1 tablet (20 mg total) by mouth daily as needed.  90 tablet  3  . glipiZIDE (GLUCOTROL) 5 MG tablet Take 1 by mouth in the morning then 1/2 tablet in afternoon each day  180 tablet  3  . glucose blood (ACCU-CHEK AVIVA PLUS) test strip Use to test blood sugar up to three times daily  100 each  12  . glucose blood (PRODIGY NO CODING BLOOD GLUC) test strip To test blood sugar 3 times a day  100 each  12  . hydrALAZINE (APRESOLINE) 25 MG tablet Take 1 tablet (25 mg total) by mouth 3 (three) times daily as needed.  30 tablet  3  . levothyroxine (SYNTHROID, LEVOTHROID) 75 MCG tablet Take 1 tablet (75 mcg total) by mouth daily.  90 tablet  3  . lovastatin (MEVACOR) 40 MG tablet Take 1 tablet (40 mg total) by mouth at bedtime.  30 tablet  3  . metoprolol (LOPRESSOR) 50 MG tablet  Take 1 tablet (50 mg total) by mouth 2 (two) times daily. As needed for increased heart rate  60 tablet  3  . mometasone (ELOCON) 0.1 % cream Apply topically daily.  45 g  0  . nitroGLYCERIN (NITROSTAT) 0.4 MG SL tablet One tablet under tongue at onset of chest pain, you may repeat every 5 minutes for up to 3 doses  30 tablet  3  . nortriptyline (PAMELOR) 50 MG capsule Take 1 capsule (50 mg total) by mouth at bedtime.  30 capsule  6  . nystatin cream (MYCOSTATIN) Apply 1 application topically 2 (two) times daily as needed.  30 g  3  . Olopatadine HCl (PATADAY) 0.2 % SOLN Apply one drop to each eye daily  1 Bottle  3  . omeprazole (PRILOSEC) 20 MG capsule Take 20 mg by mouth daily.        . Oxycodone HCl 10 MG TABS Take 1 tablet (10 mg total) by mouth 2 (two) times daily as needed.  60 each  0  . pregabalin (LYRICA) 100 MG capsule Take 1 capsule (100 mg total) by mouth 3 (three) times daily.  90 capsule  3  . tiotropium (SPIRIVA HANDIHALER) 18 MCG inhalation capsule Place 1 capsule (18 mcg total) into inhaler and inhale daily.  30 capsule  3  . topiramate (TOPAMAX) 50 MG tablet Take 1 tablet (50 mg total) by mouth 3 (three) times daily.  270 tablet  3  . zolpidem (AMBIEN) 10 MG tablet Take one tablet by mouth at bedtime  30 tablet  1     The PMH, PSH, Social History, Family History, Medications, and allergies have been reviewed in [facility], and have been updated if relevant.  Review of Systems       See HPI   Physical Exam BP 140/90  Pulse 88  Temp(Src) 98.1 F (36.7 C) (Oral)  Wt 226 lb (102.513 kg)  General:  alert and overweight-appearing, no acute distress.   Head:  normocephalic, atraumatic, and no abnormalities observed.   Eyes:  vision grossly intact, pupils equal, pupils round, and pupils reactive to light.   Ears:  R ear normal and L ear normal.   Nose:  no external deformity.   Mouth:  pharynx pink and moist.   Neck:  No deformities, masses, or tenderness noted. Lungs:  Normal  respiratory effort, chest expands symmetrically. Lungs  are clear to auscultation, no crackles or wheezes. Heart:  Normal rate and regular rhythm. S1 and S2 normal without gallop, murmur, click, rub or other extra sounds. Abdomen:  Bowel sounds positive,abdomen soft and non-tender without masses, organomegaly or hernias noted. Msk:  gross deformity of left arm/hand. Pulses:  R and L carotid,radial,femoral,dorsalis pedis and posterior tibial pulses are full and equal bilaterally Extremities:  no edema Neurologic:  alert & oriented X3 and gait normal.   Skin:  Intact without suspicious lesions or rashes Psych:  Oriented X3 and good eye contact.    Assessment and Plan: 1. DM Stable. Continue current meds.  Recheck a1c today.  2. Chronic pain Stable. Refilled her oxycodone today.   3. Nausea- Deteriorated.  Likely due to her worsening migraines- has refused triptans. She is taking topamax for prophylaxis. Rx given for zofran.  4. HYPERTENSION  Stable on current meds.

## 2011-06-21 ENCOUNTER — Telehealth: Payer: Self-pay

## 2011-06-21 DIAGNOSIS — R110 Nausea: Secondary | ICD-10-CM

## 2011-06-21 DIAGNOSIS — E785 Hyperlipidemia, unspecified: Secondary | ICD-10-CM

## 2011-06-21 NOTE — Telephone Encounter (Signed)
Additional orders entered. Please do not charge her for these. I also added lipase given her nausea.

## 2011-06-21 NOTE — Telephone Encounter (Signed)
Spoke with patient, lab appt scheduled.

## 2011-06-21 NOTE — Telephone Encounter (Signed)
Pt wants blood level checked for vitamin D and cholesterol levels. Pt thought these test were done on 06/18/11. Pt said feeling no better; still tired, nauseated, and h/a. Pt request call back at [phone].

## 2011-06-26 ENCOUNTER — Other Ambulatory Visit (INDEPENDENT_AMBULATORY_CARE_PROVIDER_SITE_OTHER): Payer: Medicare Other

## 2011-06-26 DIAGNOSIS — R110 Nausea: Secondary | ICD-10-CM | POA: Diagnosis not present

## 2011-06-26 DIAGNOSIS — E785 Hyperlipidemia, unspecified: Secondary | ICD-10-CM

## 2011-06-26 DIAGNOSIS — E559 Vitamin D deficiency, unspecified: Secondary | ICD-10-CM

## 2011-06-26 LAB — LIPID PANEL
Cholesterol: 173 mg/dL (ref 0–200)
HDL: 45.3 mg/dL (ref >39.00)
LDL Cholesterol: 100 mg/dL — ABNORMAL HIGH (ref 0–99)
VLDL: 28.2 mg/dL (ref 0.0–40.0)

## 2011-06-27 ENCOUNTER — Telehealth: Payer: Self-pay | Admitting: Family Medicine

## 2011-06-27 MED ORDER — TOPIRAMATE 100 MG PO TABS: ORAL_TABLET | ORAL | Status: DC

## 2011-06-27 NOTE — Telephone Encounter (Signed)
Yes ok to increase to 300 mg daily and send rx for one month supply, 1 refill.

## 2011-06-27 NOTE — Telephone Encounter (Signed)
Caller: Annaliza/Patient; PCP: [name] (Nestor Ramp); CB#: [phone]; Call regarding Severe HA; Taking 150 Mg Topamax; Caller asks if she can increase dose to 300 mg daily.  She relates she had previously had complete relief on 150 mg per day.  Estimated starting a year ago she has had daily headaches. Pain rated at 10 of 10, eases with sleep but returns when she wakes up.  Advised see in 24 hours, parameters for callback given per Headache protocol.  Caller refused appt, requests that Topamax be increased to 300 mg daily.  Uses [facility]  [phone].

## 2011-07-13 ENCOUNTER — Telehealth: Payer: Self-pay | Admitting: *Deleted

## 2011-07-13 NOTE — Telephone Encounter (Signed)
Form for diabetic supplies is on your desk.  I checked with the patient and she does want these supplies. 

## 2011-07-13 NOTE — Telephone Encounter (Signed)
In my box

## 2011-07-13 NOTE — Telephone Encounter (Signed)
Form faxed

## 2011-07-25 ENCOUNTER — Other Ambulatory Visit: Payer: Self-pay | Admitting: *Deleted

## 2011-07-25 NOTE — Telephone Encounter (Signed)
Faxed refill request from [facility] for hydroxyzine 50 mg's.  This is not on current med list.

## 2011-07-25 NOTE — Telephone Encounter (Signed)
See 05/25/11 Dr Dayton Martes note.

## 2011-07-26 MED ORDER — HYDROXYZINE HCL 50 MG PO TABS: 50.0000 mg | ORAL_TABLET | Freq: Three times a day (TID) | ORAL | Status: AC | PRN

## 2011-07-26 NOTE — Telephone Encounter (Signed)
Rx sent 

## 2011-08-02 ENCOUNTER — Other Ambulatory Visit: Payer: Self-pay | Admitting: Family Medicine

## 2011-08-02 ENCOUNTER — Other Ambulatory Visit: Payer: Self-pay | Admitting: *Deleted

## 2011-08-02 MED ORDER — PREGABALIN 100 MG PO CAPS: 100.0000 mg | ORAL_CAPSULE | Freq: Three times a day (TID) | ORAL | Status: DC

## 2011-08-02 MED ORDER — NORTRIPTYLINE HCL 50 MG PO CAPS: 50.0000 mg | ORAL_CAPSULE | Freq: Every day | ORAL | Status: DC

## 2011-08-02 NOTE — Telephone Encounter (Signed)
See note below, both meds last filled on 07/03/11.

## 2011-08-02 NOTE — Telephone Encounter (Signed)
Faxed refill request from [facility], last filled 30 on 07/03/11.

## 2011-08-03 NOTE — Telephone Encounter (Signed)
Lyrica called [place]. 

## 2011-08-27 ENCOUNTER — Other Ambulatory Visit: Payer: Self-pay | Admitting: *Deleted

## 2011-08-27 MED ORDER — TOPIRAMATE 100 MG PO TABS: ORAL_TABLET | ORAL | Status: DC

## 2011-08-27 MED ORDER — FLUCONAZOLE 150 MG PO TABS: ORAL_TABLET | ORAL | Status: DC

## 2011-08-27 MED ORDER — ZOLPIDEM TARTRATE 10 MG PO TABS: ORAL_TABLET | ORAL | Status: DC

## 2011-08-27 NOTE — Telephone Encounter (Signed)
Faxed requests from [place].

## 2011-08-27 NOTE — Telephone Encounter (Signed)
Medicine called to pharmacy. 

## 2011-09-18 ENCOUNTER — Encounter: Payer: Self-pay | Admitting: Family Medicine

## 2011-09-18 ENCOUNTER — Ambulatory Visit (INDEPENDENT_AMBULATORY_CARE_PROVIDER_SITE_OTHER): Payer: Medicare Other | Admitting: Family Medicine

## 2011-09-18 ENCOUNTER — Other Ambulatory Visit: Payer: Self-pay | Admitting: *Deleted

## 2011-09-18 VITALS — BP 134/80 | HR 88 | Temp 98.8°F | Wt 229.0 lb

## 2011-09-18 DIAGNOSIS — I10 Essential (primary) hypertension: Secondary | ICD-10-CM

## 2011-09-18 DIAGNOSIS — J029 Acute pharyngitis, unspecified: Secondary | ICD-10-CM

## 2011-09-18 DIAGNOSIS — E119 Type 2 diabetes mellitus without complications: Secondary | ICD-10-CM

## 2011-09-18 MED ORDER — LEVOTHYROXINE SODIUM 75 MCG PO TABS: 75.0000 ug | ORAL_TABLET | Freq: Every day | ORAL | Status: DC

## 2011-09-18 MED ORDER — ZOLPIDEM TARTRATE 10 MG PO TABS: ORAL_TABLET | ORAL | Status: DC

## 2011-09-18 MED ORDER — ALBUTEROL SULFATE HFA 108 (90 BASE) MCG/ACT IN AERS: 2.0000 | INHALATION_SPRAY | RESPIRATORY_TRACT | Status: DC | PRN

## 2011-09-18 MED ORDER — NITROGLYCERIN 0.4 MG SL SUBL: SUBLINGUAL_TABLET | SUBLINGUAL | Status: DC

## 2011-09-18 MED ORDER — TOPIRAMATE 100 MG PO TABS: ORAL_TABLET | ORAL | Status: DC

## 2011-09-18 MED ORDER — LOVASTATIN 40 MG PO TABS: 40.0000 mg | ORAL_TABLET | Freq: Every day | ORAL | Status: DC

## 2011-09-18 MED ORDER — MAGIC MOUTHWASH W/LIDOCAINE: ORAL | Status: DC

## 2011-09-18 MED ORDER — OXYCODONE HCL 10 MG PO TABS: 10.0000 mg | ORAL_TABLET | Freq: Two times a day (BID) | ORAL | Status: DC | PRN

## 2011-09-18 MED ORDER — GLIPIZIDE 5 MG PO TABS: ORAL_TABLET | ORAL | Status: DC

## 2011-09-18 MED ORDER — FLUTICASONE PROPIONATE 50 MCG/ACT NA SUSP: 2.0000 | Freq: Every day | NASAL | Status: DC

## 2011-09-18 MED ORDER — ESTROGENS CONJUGATED 0.625 MG PO TABS: 0.6250 mg | ORAL_TABLET | Freq: Every day | ORAL | Status: DC

## 2011-09-18 MED ORDER — TIOTROPIUM BROMIDE MONOHYDRATE 18 MCG IN CAPS: 18.0000 ug | ORAL_CAPSULE | Freq: Every day | RESPIRATORY_TRACT | Status: DC

## 2011-09-18 MED ORDER — FUROSEMIDE 20 MG PO TABS: 20.0000 mg | ORAL_TABLET | Freq: Every day | ORAL | Status: DC | PRN

## 2011-09-18 MED ORDER — NORTRIPTYLINE HCL 50 MG PO CAPS: 50.0000 mg | ORAL_CAPSULE | Freq: Every day | ORAL | Status: DC

## 2011-09-18 MED ORDER — NYSTATIN 100000 UNIT/GM EX CREA: 1.0000 "application " | TOPICAL_CREAM | Freq: Two times a day (BID) | CUTANEOUS | Status: DC | PRN

## 2011-09-18 MED ORDER — HYDRALAZINE HCL 25 MG PO TABS: 25.0000 mg | ORAL_TABLET | Freq: Three times a day (TID) | ORAL | Status: DC | PRN

## 2011-09-18 NOTE — Progress Notes (Signed)
66 yo with multiple medical problems here for follow up.  Sore throat- woke up with morning with right sided sore throat. Had chills, no fever. No cough or runny nose.  DM- CBGs stable.  On Glucotrol 5 mg daily.  CBGs having been running 100-173. No CBGs higher than 200.  Checks CBGs once daily. Lab Results  Component Value Date   HGBA1C 6.8* 06/18/2011   Chronic pain- managing ok, Oxycodone and nortriptyline have helped.  Taking care of her husband who is also disabled.   CP- less episodes of CP.  Takes NTG as needed.  Patient Active Problem List  Diagnosis  . UNSPECIFIED HYPOTHYROIDISM  . DIABETES MELLITUS, TYPE II  . HYPERLIPIDEMIA  . GOUT  . GRIEF REACTION, ACUTE  . PERIPHERAL NEUROPATHY  . HYPERTENSION  . LEFT BUNDLE BRANCH BLOCK  . SINUSITIS, ACUTE  . ACUTE PHARYNGITIS  . ALLERGIC RHINITIS  . COPD  . GERD  . LOW BACK PAIN  . DYSURIA  . MYOCARDIAL INFARCTION, HX OF  . SINUSITIS, CHRONIC  . HEADACHE  . PALPITATIONS  . SHORTNESS OF BREATH  . CHEST PAIN UNSPECIFIED  . ECHOCARDIOGRAM, ABNORMAL  . ELECTROCARDIOGRAM, ABNORMAL  . ATYPICAL FACE PAIN  . RECTAL BLEEDING  . Vitamin D deficiency  . Left-sided chest wall pain  . Polyuria  . Hypothyroidism   Past Medical History  Diagnosis Date  . Allergy   . Diabetes mellitus   . Gout   . Hyperlipidemia   . Hypertension   . COPD (chronic obstructive pulmonary disease)   . Peripheral neuropathy   . Low back pain    Past Surgical History  Procedure Date  . Polypectomy     Colon  . Abdominal hysterectomy   . Tubal ligation    History  Substance Use Topics  . Smoking status: Never Smoker   . Smokeless tobacco: Not on file  . Alcohol Use: No   Family History  Problem Relation Age of Onset  . Uterine cancer Mother    Allergies  Allergen Reactions  . Levofloxacin Hives   Current Outpatient Prescriptions on File Prior to Visit  Medication Sig Dispense Refill  . ACCU-CHEK FASTCLIX LANCETS MISC 1 Device  by Does not apply route 3 (three) times daily. Use to check blood sugar up to three times a day  102 each  12  . albuterol (PROAIR HFA) 108 (90 BASE) MCG/ACT inhaler Inhale 2 puffs into the lungs every 4 (four) hours as needed.  1 Inhaler  6  . Black Cohosh 540 MG CAPS Take 1 capsule by mouth daily.        . diclofenac (FLECTOR) 1.3 % PTCH Place 1 patch onto the skin as directed.  60 patch  0  . diphenhydrAMINE (BENADRYL) 25 MG tablet Take 25 mg by mouth as needed.        Marland Kitchen esomeprazole (NEXIUM) 40 MG capsule 1-2 caps by mouth daily  180 capsule  3  . estrogens, conjugated, (PREMARIN) 0.625 MG tablet Take 144 tablets (90 mg total) by mouth daily.  90 tablet  3  . fexofenadine (ALLEGRA) 180 MG tablet Take 180 mg by mouth as needed.        . fluconazole (DIFLUCAN) 150 MG tablet Take one tablet by mouth as one dose  1 tablet  0  . fluticasone (FLONASE) 50 MCG/ACT nasal spray Place 2 sprays into the nose daily.  48 g  3  . furosemide (LASIX) 20 MG tablet Take 1 tablet (20 mg  total) by mouth daily as needed.  90 tablet  3  . glipiZIDE (GLUCOTROL) 5 MG tablet Take 1 by mouth in the morning then 1/2 tablet in afternoon each day  135 tablet  3  . glucose blood (ACCU-CHEK AVIVA PLUS) test strip Use to test blood sugar up to three times daily  100 each  12  . glucose blood (PRODIGY NO CODING BLOOD GLUC) test strip To test blood sugar 3 times a day  100 each  12  . hydrALAZINE (APRESOLINE) 25 MG tablet Take 1 tablet (25 mg total) by mouth 3 (three) times daily as needed.  30 tablet  3  . levothyroxine (SYNTHROID, LEVOTHROID) 75 MCG tablet Take 1 tablet (75 mcg total) by mouth daily.  90 tablet  3  . lovastatin (MEVACOR) 40 MG tablet Take 1 tablet (40 mg total) by mouth at bedtime.  90 tablet  3  . metoprolol (LOPRESSOR) 50 MG tablet Take 1 tablet (50 mg total) by mouth 2 (two) times daily. As needed for increased heart rate  60 tablet  3  . mometasone (ELOCON) 0.1 % cream Apply topically daily.  45 g  0  .  nitroGLYCERIN (NITROSTAT) 0.4 MG SL tablet One tablet under tongue at onset of chest pain, you may repeat every 5 minutes for up to 3 doses  30 tablet  3  . nortriptyline (PAMELOR) 50 MG capsule Take 1 capsule (50 mg total) by mouth at bedtime.  30 capsule  1  . nystatin cream (MYCOSTATIN) Apply 1 application topically 2 (two) times daily as needed.  30 g  3  . Olopatadine HCl (PATADAY) 0.2 % SOLN Apply one drop to each eye daily  3 Bottle  3  . Oxycodone HCl 10 MG TABS Take 1 tablet (10 mg total) by mouth 2 (two) times daily as needed.  60 each  0  . pregabalin (LYRICA) 100 MG capsule Take 1 capsule (100 mg total) by mouth 3 (three) times daily.  90 capsule  3  . tiotropium (SPIRIVA HANDIHALER) 18 MCG inhalation capsule Place 1 capsule (18 mcg total) into inhaler and inhale daily.  90 capsule  3  . topiramate (TOPAMAX) 100 MG tablet Take 3 tablets by mouth daily.  90 tablet  0  . zolpidem (AMBIEN) 10 MG tablet Take one tablet by mouth at bedtime  30 tablet  0     The PMH, PSH, Social History, Family History, Medications, and allergies have been reviewed in [facility], and have been updated if relevant.  Review of Systems       See HPI   Physical Exam BP 134/80  Pulse 88  Temp 98.8 F (37.1 C)  Wt 229 lb (103.874 kg)  General:  alert and overweight-appearing, no acute distress.   Head:  normocephalic, atraumatic, and no abnormalities observed.   Eyes:  vision grossly intact, pupils equal, pupils round, and pupils reactive to light.   Ears:  R ear normal and L ear normal.   Nose:  no external deformity.   Mouth:   Erythema with ulcerated lesions without exudate, tonsilar enlargement. Neck:  No deformities, masses, or tenderness noted. Lungs:  Normal respiratory effort, chest expands symmetrically. Lungs are clear to auscultation, no crackles or wheezes. Heart:  Normal rate and regular rhythm. S1 and S2 normal without gallop, murmur, click, rub or other extra sounds. Abdomen:  Bowel sounds  positive,abdomen soft and non-tender without masses, organomegaly or hernias noted. Msk:  gross deformity of left arm/hand. Pulses:  R and L carotid,radial,femoral,dorsalis pedis and posterior tibial pulses are full and equal bilaterally Extremities:  no edema Neurologic:  alert & oriented X3 and gait normal.   Skin:  Intact without suspicious lesions or rashes Psych:  Oriented X3 and good eye contact.    Assessment and Plan: 1. DM Stable. Continue current meds.  Recheck a1c in 3 months.  2. Chronic pain Stable. Refilled her oxycodone today.   3. Sore throat  Rapid strep neg, likely viral. Will send throat swab for cx.   4. HYPERTENSION  Stable on current meds.

## 2011-09-18 NOTE — Telephone Encounter (Signed)
I did not see lyrica on patient medication list is it okay to refill?

## 2011-09-18 NOTE — Patient Instructions (Addendum)
It was nice to see you. We will call you with your throat culture results but this is likely a virus. You can take the magic mouthwash as directed for your sore throat- ok to use until your symptoms resolve.  Please come see me in 3 months.

## 2011-09-19 MED ORDER — DICLOFENAC EPOLAMINE 1.3 % TD PTCH: 1.0000 | MEDICATED_PATCH | TRANSDERMAL | Status: DC

## 2011-09-20 NOTE — Telephone Encounter (Signed)
Pt wants refills on lyrica, this is no longer on med list.  Ok to refill?

## 2011-09-25 ENCOUNTER — Other Ambulatory Visit: Payer: Self-pay | Admitting: *Deleted

## 2011-09-25 MED ORDER — FLUCONAZOLE 150 MG PO TABS: ORAL_TABLET | ORAL | Status: DC

## 2011-09-25 NOTE — Telephone Encounter (Signed)
Faxed refill request from [facility], last filled 08/27/11.

## 2011-10-01 ENCOUNTER — Telehealth: Payer: Self-pay | Admitting: Family Medicine

## 2011-10-01 MED ORDER — AZITHROMYCIN 250 MG PO TABS: ORAL_TABLET | ORAL | Status: AC

## 2011-10-01 NOTE — Telephone Encounter (Signed)
Caller: [name]/Patient; Patient Name: Mariah Reed; PCP: [name] [facility]); Best Callback Phone Number: [phone].  Pt wants to leave a message for Dr Dayton Martes.  Pt reports that she needs a need an antibiotic for sinus infection.  Pt states she has this done for her before.  Pt would not allow RN to triage symptoms, she states she has been through this so much and knows what is wrong and she has been sick with this all weekend. RN told pt she would relay the message

## 2011-10-01 NOTE — Telephone Encounter (Signed)
Advised patient

## 2011-10-01 NOTE — Telephone Encounter (Signed)
Pt states she has the same symptoms that she always gets, says you are very familiar with her sinus infections- cough, HA, eyes hurting, fever.  Uses [facility].  Wants a z-pack.

## 2011-10-01 NOTE — Addendum Note (Signed)
Addended by: Eliezer Bottom on: 10/01/2011 02:12 PM   Modules accepted: Orders

## 2011-10-01 NOTE — Telephone Encounter (Signed)
Ok to send rx for zpack. If symptoms persist, needs to be seen.

## 2011-10-17 DIAGNOSIS — H269 Unspecified cataract: Secondary | ICD-10-CM | POA: Diagnosis not present

## 2011-10-17 DIAGNOSIS — E119 Type 2 diabetes mellitus without complications: Secondary | ICD-10-CM | POA: Diagnosis not present

## 2011-10-23 ENCOUNTER — Telehealth: Payer: Self-pay | Admitting: Family Medicine

## 2011-10-23 NOTE — Telephone Encounter (Signed)
Advised patient I haven't seen any form for the patient, but I told her I will keep an eye out for it.

## 2011-10-23 NOTE — Telephone Encounter (Signed)
Caller: [name]/Patient; Phone: [phone]; Reason for Call: Patient calling to see if a diabetic supply company has fax information to the office yet and also she needed to know the name of the diabetic company.  Please call her back.  Thanks

## 2011-10-25 ENCOUNTER — Telehealth: Payer: Self-pay | Admitting: Family Medicine

## 2011-10-25 MED ORDER — AZITHROMYCIN 250 MG PO TABS: ORAL_TABLET | ORAL | Status: DC

## 2011-10-25 MED ORDER — FLUCONAZOLE 150 MG PO TABS: ORAL_TABLET | ORAL | Status: DC

## 2011-10-25 MED ORDER — HYDROXYZINE HCL 50 MG PO TABS: 50.0000 mg | ORAL_TABLET | Freq: Three times a day (TID) | ORAL | Status: DC | PRN

## 2011-10-25 NOTE — Telephone Encounter (Signed)
Advised pt she will need to be seen before any more antibiotics are prescribed.  Pt agreed.

## 2011-10-25 NOTE — Telephone Encounter (Signed)
Yes ok to refill 

## 2011-10-25 NOTE — Telephone Encounter (Signed)
Ok to refill diflucan 150 mg po x 1 and one zpack with NO refills. If symptoms persist, she needs to be evaluated.

## 2011-10-25 NOTE — Telephone Encounter (Signed)
Faxed refill requests from [facility] for vistaril, diflucan and azithromycin.  She says she has another sinus infection- doesn't think she got over the last one that she had.  Says she has the same symptoms as last time.

## 2011-10-25 NOTE — Telephone Encounter (Signed)
Ok to refill vistaril

## 2011-10-25 NOTE — Telephone Encounter (Signed)
Caller: Mariah Reed/Patient; Patient Name: Mariah Reed; PCP: [name] [facility]); [facility] Call[facility] Phone Number: [phone]; Reason for call: Sore Throat Rn called pt back. Pt refused to speak with triage nurse. Only wants to speak with Dr. Sharin Grave nurse. Message sent to office.

## 2011-11-08 ENCOUNTER — Telehealth: Payer: Self-pay | Admitting: *Deleted

## 2011-11-08 NOTE — Telephone Encounter (Signed)
Form faxed

## 2011-11-08 NOTE — Telephone Encounter (Signed)
Form from [employer] for diabetic supplies is on your desk.  I checked with the patient and she said she does want supplies from this company.

## 2011-11-08 NOTE — Telephone Encounter (Signed)
Form signed and in my box. 

## 2011-11-19 ENCOUNTER — Ambulatory Visit (INDEPENDENT_AMBULATORY_CARE_PROVIDER_SITE_OTHER): Payer: Medicare Other | Admitting: Family Medicine

## 2011-11-19 ENCOUNTER — Encounter: Payer: Self-pay | Admitting: Family Medicine

## 2011-11-19 VITALS — BP 120/80 | HR 100 | Resp 98 | Wt 224.0 lb

## 2011-11-19 DIAGNOSIS — J329 Chronic sinusitis, unspecified: Secondary | ICD-10-CM

## 2011-11-19 DIAGNOSIS — Z23 Encounter for immunization: Secondary | ICD-10-CM

## 2011-11-19 MED ORDER — AMOXICILLIN-POT CLAVULANATE 875-125 MG PO TABS: 1.0000 | ORAL_TABLET | Freq: Two times a day (BID) | ORAL | Status: DC

## 2011-11-19 NOTE — Progress Notes (Signed)
66 yo with multiple medical problems here for:  Persistent sinus pressure and white patch in back of her throat. Initially saw her for these symptoms in August. Throat cx neg for strep.  Denies any fevers.  Has a h/o chronic sinusitis.     Patient Active Problem List  Diagnosis  . UNSPECIFIED HYPOTHYROIDISM  . DIABETES MELLITUS, TYPE II  . HYPERLIPIDEMIA  . GOUT  . GRIEF REACTION, ACUTE  . PERIPHERAL NEUROPATHY  . HYPERTENSION  . LEFT BUNDLE BRANCH BLOCK  . SINUSITIS, ACUTE  . ACUTE PHARYNGITIS  . ALLERGIC RHINITIS  . COPD  . GERD  . LOW BACK PAIN  . DYSURIA  . MYOCARDIAL INFARCTION, HX OF  . SINUSITIS, CHRONIC  . HEADACHE  . PALPITATIONS  . SHORTNESS OF BREATH  . CHEST PAIN UNSPECIFIED  . ECHOCARDIOGRAM, ABNORMAL  . ELECTROCARDIOGRAM, ABNORMAL  . ATYPICAL FACE PAIN  . RECTAL BLEEDING  . Vitamin D deficiency  . Left-sided chest wall pain  . Polyuria  . Hypothyroidism  . Sore throat   Past Medical History  Diagnosis Date  . Allergy   . Diabetes mellitus   . Gout   . Hyperlipidemia   . Hypertension   . COPD (chronic obstructive pulmonary disease)   . Peripheral neuropathy   . Low back pain    Past Surgical History  Procedure Date  . Polypectomy     Colon  . Abdominal hysterectomy   . Tubal ligation    History  Substance Use Topics  . Smoking status: Never Smoker   . Smokeless tobacco: Not on file  . Alcohol Use: No   Family History  Problem Relation Age of Onset  . Uterine cancer Mother    Allergies  Allergen Reactions  . Levofloxacin Hives   Current Outpatient Prescriptions on File Prior to Visit  Medication Sig Dispense Refill  . ACCU-CHEK FASTCLIX LANCETS MISC 1 Device by Does not apply route 3 (three) times daily. Use to check blood sugar up to three times a day  102 each  12  . albuterol (PROAIR HFA) 108 (90 BASE) MCG/ACT inhaler Inhale 2 puffs into the lungs every 4 (four) hours as needed.  1 Inhaler  6  . Alum & Mag  Hydroxide-Simeth (MAGIC MOUTHWASH W/LIDOCAINE) SOLN Swish spit 5 ml four times daily as needed.  240 mL  0  . Black Cohosh 540 MG CAPS Take 1 capsule by mouth daily.        . diclofenac (FLECTOR) 1.3 % PTCH Place 1 patch onto the skin as directed.  60 patch  0  . diphenhydrAMINE (BENADRYL) 25 MG tablet Take 25 mg by mouth as needed.        Marland Kitchen esomeprazole (NEXIUM) 40 MG capsule 1-2 caps by mouth daily  180 capsule  3  . estrogens, conjugated, (PREMARIN) 0.625 MG tablet Take 1 tablet (0.625 mg total) by mouth daily.  30 tablet  1  . fexofenadine (ALLEGRA) 180 MG tablet Take 180 mg by mouth as needed.        . fluconazole (DIFLUCAN) 150 MG tablet Take one tablet by mouth as one dose  1 tablet  0  . fluticasone (FLONASE) 50 MCG/ACT nasal spray Place 2 sprays into the nose daily.  16 g  3  . furosemide (LASIX) 20 MG tablet Take 1 tablet (20 mg total) by mouth daily as needed.  30 tablet  4  . glipiZIDE (GLUCOTROL) 5 MG tablet Take 1 by mouth in the morning then 1/2  tablet in afternoon each day  45 tablet  3  . glucose blood (ACCU-CHEK AVIVA PLUS) test strip Use to test blood sugar up to three times daily  100 each  12  . glucose blood (PRODIGY NO CODING BLOOD GLUC) test strip To test blood sugar 3 times a day  100 each  12  . hydrALAZINE (APRESOLINE) 25 MG tablet Take 1 tablet (25 mg total) by mouth 3 (three) times daily as needed.  30 tablet  3  . hydrOXYzine (ATARAX/VISTARIL) 50 MG tablet Take 1 tablet (50 mg total) by mouth 3 (three) times daily as needed.  90 tablet  0  . levothyroxine (SYNTHROID, LEVOTHROID) 75 MCG tablet Take 1 tablet (75 mcg total) by mouth daily.  30 tablet  3  . lovastatin (MEVACOR) 40 MG tablet Take 1 tablet (40 mg total) by mouth at bedtime.  30 tablet  4  . metoprolol (LOPRESSOR) 50 MG tablet Take 1 tablet (50 mg total) by mouth 2 (two) times daily. As needed for increased heart rate  60 tablet  3  . mometasone (ELOCON) 0.1 % cream Apply topically daily.  45 g  0  .  nitroGLYCERIN (NITROSTAT) 0.4 MG SL tablet One tablet under tongue at onset of chest pain, you may repeat every 5 minutes for up to 3 doses  30 tablet  3  . nortriptyline (PAMELOR) 50 MG capsule Take 1 capsule (50 mg total) by mouth at bedtime.  30 capsule  1  . nystatin cream (MYCOSTATIN) Apply 1 application topically 2 (two) times daily as needed.  30 g  3  . Olopatadine HCl (PATADAY) 0.2 % SOLN Apply one drop to each eye daily  3 Bottle  3  . Oxycodone HCl 10 MG TABS Take 1 tablet (10 mg total) by mouth 2 (two) times daily as needed.  60 each  0  . Oxycodone HCl 10 MG TABS Take 1 tablet (10 mg total) by mouth 2 (two) times daily as needed.  60 each  0  . pregabalin (LYRICA) 100 MG capsule Take 1 capsule (100 mg total) by mouth 3 (three) times daily.  90 capsule  3  . tiotropium (SPIRIVA HANDIHALER) 18 MCG inhalation capsule Place 1 capsule (18 mcg total) into inhaler and inhale daily.  30 capsule  6  . topiramate (TOPAMAX) 100 MG tablet Take 3 tablets by mouth daily.  90 tablet  4  . zolpidem (AMBIEN) 10 MG tablet Take one tablet by mouth at bedtime  30 tablet  4     The PMH, PSH, Social History, Family History, Medications, and allergies have been reviewed in [facility], and have been updated if relevant.  Review of Systems       See HPI   Physical Exam BP 120/80  Pulse 100  Resp 98  Wt 224 lb (101.606 kg)  General:  alert and overweight-appearing, no acute distress.   Head:  normocephalic, atraumatic, and no abnormalities observed.   Eyes:  vision grossly intact, pupils equal, pupils round, and pupils reactive to light.   Ears:  R ear normal and L ear normal.   Nose:  no external deformity.   +boggy turbinates Mouth:   Erythema with ulcerated lesions without exudate, tonsilar enlargement. Neck:  No deformities, masses, or tenderness noted. Lungs:  Normal respiratory effort, chest expands symmetrically. Lungs are clear to auscultation, no crackles or wheezes. Heart:  Normal rate and  regular rhythm. S1 and S2 normal without gallop, murmur, click, rub or other  extra sounds. Psych:  Oriented X3 and good eye contact.    Assessment and Plan:  1. SINUSITIS, CHRONIC    Deteriorated.  Explained to Mariah Reed that using abx repeatedly for sinusitis is not good practice and causes drug resistance.   She may need sinus surgery. Will place on course of Augmentin given her multiple co morbidities. The area in throat has persisted for almost 2 months and I recommend ENT follow up for this. She declines at this time.  She will call me at end of the week with an update and let me know if she is ok with seeing ENT at that time.

## 2011-11-19 NOTE — Patient Instructions (Addendum)
It was good to see you. Please take Augmentin as directed. I really recommend you seeing an Ear,Nose and Throat doctor. Please call me at the end of the week with an update.

## 2011-11-21 ENCOUNTER — Other Ambulatory Visit: Payer: Self-pay | Admitting: *Deleted

## 2011-11-21 MED ORDER — FLUCONAZOLE 150 MG PO TABS: ORAL_TABLET | ORAL | Status: DC

## 2011-11-21 MED ORDER — PREGABALIN 100 MG PO CAPS: 100.0000 mg | ORAL_CAPSULE | Freq: Three times a day (TID) | ORAL | Status: DC

## 2011-11-21 MED ORDER — NORTRIPTYLINE HCL 50 MG PO CAPS: 50.0000 mg | ORAL_CAPSULE | Freq: Every day | ORAL | Status: DC

## 2011-11-22 NOTE — Telephone Encounter (Signed)
Pt is asking for 4 months of refills on nortriptyline.

## 2011-11-23 ENCOUNTER — Other Ambulatory Visit: Payer: Self-pay | Admitting: *Deleted

## 2011-11-23 MED ORDER — FLUCONAZOLE 150 MG PO TABS: ORAL_TABLET | ORAL | Status: DC

## 2011-11-23 MED ORDER — NORTRIPTYLINE HCL 50 MG PO CAPS: 50.0000 mg | ORAL_CAPSULE | Freq: Every day | ORAL | Status: DC

## 2011-11-23 NOTE — Telephone Encounter (Signed)
lyrica called to [facility].

## 2011-11-23 NOTE — Telephone Encounter (Signed)
Ok to refill as requested 

## 2011-11-23 NOTE — Telephone Encounter (Signed)
Medicines called to [facility]. 

## 2011-11-30 ENCOUNTER — Telehealth: Payer: Self-pay

## 2011-11-30 NOTE — Telephone Encounter (Signed)
See note below, form is on your desk.

## 2011-11-30 NOTE — Telephone Encounter (Signed)
Pt called and wanted status on form for diabetic shoes. Also received call from [place] with [facility] [employer] on status of rx and form for diabetic shoes. Spoke with Morrie Sheldon and she refaxed form.  Faxed form given to Dr Elmer Sow CMA.

## 2011-11-30 NOTE — Telephone Encounter (Signed)
Signed, in my box. 

## 2011-11-30 NOTE — Telephone Encounter (Signed)
Form faxed, per Lyla Son.

## 2011-12-10 ENCOUNTER — Ambulatory Visit (INDEPENDENT_AMBULATORY_CARE_PROVIDER_SITE_OTHER): Payer: Medicare Other | Admitting: Family Medicine

## 2011-12-10 ENCOUNTER — Telehealth: Payer: Self-pay

## 2011-12-10 ENCOUNTER — Encounter: Payer: Self-pay | Admitting: Family Medicine

## 2011-12-10 VITALS — BP 150/82 | HR 88 | Temp 98.4°F | Wt 223.0 lb

## 2011-12-10 DIAGNOSIS — E119 Type 2 diabetes mellitus without complications: Secondary | ICD-10-CM | POA: Diagnosis not present

## 2011-12-10 DIAGNOSIS — E785 Hyperlipidemia, unspecified: Secondary | ICD-10-CM

## 2011-12-10 DIAGNOSIS — E039 Hypothyroidism, unspecified: Secondary | ICD-10-CM | POA: Diagnosis not present

## 2011-12-10 LAB — HEMOGLOBIN A1C: Hgb A1c MFr Bld: 6.8 % — ABNORMAL HIGH (ref 4.6–6.5)

## 2011-12-10 LAB — COMPREHENSIVE METABOLIC PANEL
ALT: 19 U/L (ref 0–35)
AST: 19 U/L (ref 0–37)
Alkaline Phosphatase: 73 U/L (ref 39–117)
Creatinine, Ser: 1.1 mg/dL (ref 0.4–1.2)
Total Bilirubin: 0.3 mg/dL (ref 0.3–1.2)

## 2011-12-10 MED ORDER — OXYCODONE HCL 10 MG PO TABS: 10.0000 mg | ORAL_TABLET | Freq: Two times a day (BID) | ORAL | Status: DC | PRN

## 2011-12-10 NOTE — Progress Notes (Signed)
66 yo with multiple medical problems here for follow up.  Hypothyroidism- does complain of skin feeling drier lately.  Denies any other symptoms of hypo or hyperthyroidism. Lab Results  Component Value Date   TSH 1.63 09/18/2010    DM- CBGs stable.  On Glucotrol 5 mg daily.  CBGs having been running in low 100s fasting. No CBGs higher than 200.  Checks CBGs once daily. Lab Results  Component Value Date   HGBA1C 6.8* 06/18/2011   Chronic pain- managing ok, Oxycodone and nortriptyline have helped.  Taking care of her husband who is also disabled.   CP- less episodes of CP.  Takes NTG as needed.  Patient Active Problem List  Diagnosis  . UNSPECIFIED HYPOTHYROIDISM  . DIABETES MELLITUS, TYPE II  . HYPERLIPIDEMIA  . GOUT  . GRIEF REACTION, ACUTE  . PERIPHERAL NEUROPATHY  . HYPERTENSION  . LEFT BUNDLE BRANCH BLOCK  . SINUSITIS, ACUTE  . ACUTE PHARYNGITIS  . ALLERGIC RHINITIS  . COPD  . GERD  . LOW BACK PAIN  . DYSURIA  . MYOCARDIAL INFARCTION, HX OF  . SINUSITIS, CHRONIC  . HEADACHE  . PALPITATIONS  . SHORTNESS OF BREATH  . CHEST PAIN UNSPECIFIED  . ECHOCARDIOGRAM, ABNORMAL  . ELECTROCARDIOGRAM, ABNORMAL  . ATYPICAL FACE PAIN  . RECTAL BLEEDING  . Vitamin D deficiency  . Left-sided chest wall pain  . Polyuria  . Hypothyroidism  . Sore throat   Past Medical History  Diagnosis Date  . Allergy   . Diabetes mellitus   . Gout   . Hyperlipidemia   . Hypertension   . COPD (chronic obstructive pulmonary disease)   . Peripheral neuropathy   . Low back pain    Past Surgical History  Procedure Date  . Polypectomy     Colon  . Abdominal hysterectomy   . Tubal ligation    History  Substance Use Topics  . Smoking status: Never Smoker   . Smokeless tobacco: Not on file  . Alcohol Use: No   Family History  Problem Relation Age of Onset  . Uterine cancer Mother    Allergies  Allergen Reactions  . Levofloxacin Hives   Current Outpatient Prescriptions on File  Prior to Visit  Medication Sig Dispense Refill  . ACCU-CHEK FASTCLIX LANCETS MISC 1 Device by Does not apply route 3 (three) times daily. Use to check blood sugar up to three times a day  102 each  12  . albuterol (PROAIR HFA) 108 (90 BASE) MCG/ACT inhaler Inhale 2 puffs into the lungs every 4 (four) hours as needed.  1 Inhaler  6  . Alum & Mag Hydroxide-Simeth (MAGIC MOUTHWASH W/LIDOCAINE) SOLN Swish spit 5 ml four times daily as needed.  240 mL  0  . Black Cohosh 540 MG CAPS Take 1 capsule by mouth daily.        . diclofenac (FLECTOR) 1.3 % PTCH Place 1 patch onto the skin as directed.  60 patch  0  . diphenhydrAMINE (BENADRYL) 25 MG tablet Take 25 mg by mouth as needed.        Marland Kitchen esomeprazole (NEXIUM) 40 MG capsule 1-2 caps by mouth daily  180 capsule  3  . estrogens, conjugated, (PREMARIN) 0.625 MG tablet Take 1 tablet (0.625 mg total) by mouth daily.  30 tablet  1  . fexofenadine (ALLEGRA) 180 MG tablet Take 180 mg by mouth as needed.        . fluconazole (DIFLUCAN) 150 MG tablet Take one tablet by  mouth as one dose  1 tablet  0  . fluticasone (FLONASE) 50 MCG/ACT nasal spray Place 2 sprays into the nose daily.  16 g  3  . furosemide (LASIX) 20 MG tablet Take 1 tablet (20 mg total) by mouth daily as needed.  30 tablet  4  . glipiZIDE (GLUCOTROL) 5 MG tablet Take 1 by mouth in the morning then 1/2 tablet in afternoon each day  45 tablet  3  . glucose blood (ACCU-CHEK AVIVA PLUS) test strip Use to test blood sugar up to three times daily  100 each  12  . glucose blood (PRODIGY NO CODING BLOOD GLUC) test strip To test blood sugar 3 times a day  100 each  12  . hydrALAZINE (APRESOLINE) 25 MG tablet Take 1 tablet (25 mg total) by mouth 3 (three) times daily as needed.  30 tablet  3  . hydrOXYzine (ATARAX/VISTARIL) 50 MG tablet Take 1 tablet (50 mg total) by mouth 3 (three) times daily as needed.  90 tablet  0  . levothyroxine (SYNTHROID, LEVOTHROID) 75 MCG tablet Take 1 tablet (75 mcg total) by  mouth daily.  30 tablet  3  . lovastatin (MEVACOR) 40 MG tablet Take 1 tablet (40 mg total) by mouth at bedtime.  30 tablet  4  . metoprolol (LOPRESSOR) 50 MG tablet Take 1 tablet (50 mg total) by mouth 2 (two) times daily. As needed for increased heart rate  60 tablet  3  . mometasone (ELOCON) 0.1 % cream Apply topically daily.  45 g  0  . nitroGLYCERIN (NITROSTAT) 0.4 MG SL tablet One tablet under tongue at onset of chest pain, you may repeat every 5 minutes for up to 3 doses  30 tablet  3  . nortriptyline (PAMELOR) 50 MG capsule Take 1 capsule (50 mg total) by mouth at bedtime.  30 capsule  3  . nystatin cream (MYCOSTATIN) Apply 1 application topically 2 (two) times daily as needed.  30 g  3  . Olopatadine HCl (PATADAY) 0.2 % SOLN Apply one drop to each eye daily  3 Bottle  3  . Oxycodone HCl 10 MG TABS Take 1 tablet (10 mg total) by mouth 2 (two) times daily as needed.  60 each  0  . Oxycodone HCl 10 MG TABS Take 1 tablet (10 mg total) by mouth 2 (two) times daily as needed.  60 each  0  . pregabalin (LYRICA) 100 MG capsule Take 1 capsule (100 mg total) by mouth 3 (three) times daily.  90 capsule  3  . tiotropium (SPIRIVA HANDIHALER) 18 MCG inhalation capsule Place 1 capsule (18 mcg total) into inhaler and inhale daily.  30 capsule  6  . topiramate (TOPAMAX) 100 MG tablet Take 3 tablets by mouth daily.  90 tablet  4  . zolpidem (AMBIEN) 10 MG tablet Take one tablet by mouth at bedtime  30 tablet  4     The PMH, PSH, Social History, Family History, Medications, and allergies have been reviewed in [facility], and have been updated if relevant.  Review of Systems       See HPI   Physical Exam BP 150/82  Pulse 88  Temp 98.4 F (36.9 C)  Wt 223 lb (101.152 kg)  General:  alert and overweight-appearing, no acute distress.   Head:  normocephalic, atraumatic, and no abnormalities observed.   Eyes:  vision grossly intact, pupils equal, pupils round, and pupils reactive to light.   Ears:  R ear  normal and L ear normal.   Nose:  no external deformity.   Mouth:  pharynx pink and moist.   Neck:  No deformities, masses, or tenderness noted. Lungs:  Normal respiratory effort, chest expands symmetrically. Lungs are clear to auscultation, no crackles or wheezes. Heart:  Normal rate and regular rhythm. S1 and S2 normal without gallop, murmur, click, rub or other extra sounds. Abdomen:  Bowel sounds positive,abdomen soft and non-tender without masses, organomegaly or hernias noted. Msk:  gross deformity of left arm/hand. Pulses:  R and L carotid,radial,femoral,dorsalis pedis and posterior tibial pulses are full and equal bilaterally Extremities:  no edema Diabetic foot exam: Normal inspection No skin breakdown No calluses  Normal DP pulses Normal sensation to light touch and monofilament Nails normal  Neurologic:  alert & oriented X3 and gait normal.   Skin:  Intact without suspicious lesions or rashes Psych:  Oriented X3 and good eye contact.    Assessment and Plan: 1. DM Stable. Continue current meds.  Recheck a1c today.  2. Chronic pain Stable. Refilled her oxycodone today.   3. Hypothyroidism Recheck TSH today. Stable on current meds.

## 2011-12-10 NOTE — Telephone Encounter (Signed)
Mariah Reed with [employer] [facility] [employer] left v/m requesting order for new glucose meter faxed to [phone]; pts old meter has broken; Mariah Reed said has orders for diabetic supplies but now pt needs meter. No contact phone # left.Please advise.

## 2011-12-10 NOTE — Patient Instructions (Addendum)
Good to see you. Please say hi to [facility] for me.  We will call you with your lab results.

## 2011-12-11 NOTE — Telephone Encounter (Signed)
Rx on on my desk.

## 2011-12-11 NOTE — Telephone Encounter (Signed)
Order faxed.

## 2011-12-12 ENCOUNTER — Encounter: Payer: Self-pay | Admitting: *Deleted

## 2011-12-13 ENCOUNTER — Other Ambulatory Visit: Payer: Self-pay | Admitting: *Deleted

## 2011-12-13 MED ORDER — GLIPIZIDE 5 MG PO TABS: ORAL_TABLET | ORAL | Status: DC

## 2011-12-13 MED ORDER — ALBUTEROL SULFATE HFA 108 (90 BASE) MCG/ACT IN AERS: 2.0000 | INHALATION_SPRAY | RESPIRATORY_TRACT | Status: DC | PRN

## 2011-12-13 MED ORDER — LOVASTATIN 40 MG PO TABS: 40.0000 mg | ORAL_TABLET | Freq: Every day | ORAL | Status: DC

## 2011-12-13 MED ORDER — NITROGLYCERIN 0.4 MG SL SUBL: SUBLINGUAL_TABLET | SUBLINGUAL | Status: DC

## 2011-12-13 MED ORDER — TIOTROPIUM BROMIDE MONOHYDRATE 18 MCG IN CAPS: 18.0000 ug | ORAL_CAPSULE | Freq: Every day | RESPIRATORY_TRACT | Status: DC

## 2011-12-13 MED ORDER — OLOPATADINE HCL 0.2 % OP SOLN: OPHTHALMIC | Status: DC

## 2011-12-13 MED ORDER — FLUTICASONE PROPIONATE 50 MCG/ACT NA SUSP: 2.0000 | Freq: Every day | NASAL | Status: DC

## 2011-12-13 MED ORDER — LEVOTHYROXINE SODIUM 75 MCG PO TABS: 75.0000 ug | ORAL_TABLET | Freq: Every day | ORAL | Status: DC

## 2011-12-13 MED ORDER — ESTROGENS CONJUGATED 0.625 MG PO TABS: 0.6250 mg | ORAL_TABLET | Freq: Every day | ORAL | Status: DC

## 2011-12-13 NOTE — Telephone Encounter (Signed)
These meds were all called to [facility] on 12/10/11 per patient's request. Per pharmacist, pt had refills on most meds, but pt. wanted these refills cancelled and new scripts called in.

## 2011-12-13 NOTE — Telephone Encounter (Signed)
Which meds?  And I don't think I understand why she needs new scripts?

## 2011-12-14 NOTE — Telephone Encounter (Signed)
Explained meds to Dr. Dayton Martes.

## 2011-12-19 ENCOUNTER — Other Ambulatory Visit: Payer: Self-pay | Admitting: *Deleted

## 2011-12-19 MED ORDER — FLUCONAZOLE 150 MG PO TABS: ORAL_TABLET | ORAL | Status: DC

## 2012-01-04 ENCOUNTER — Ambulatory Visit (INDEPENDENT_AMBULATORY_CARE_PROVIDER_SITE_OTHER): Payer: Medicare Other | Admitting: Family Medicine

## 2012-01-04 ENCOUNTER — Encounter: Payer: Self-pay | Admitting: Family Medicine

## 2012-01-04 VITALS — BP 142/88 | HR 106 | Temp 97.8°F | Wt 221.0 lb

## 2012-01-04 DIAGNOSIS — J029 Acute pharyngitis, unspecified: Secondary | ICD-10-CM | POA: Diagnosis not present

## 2012-01-04 LAB — POCT RAPID STREP A (OFFICE): Rapid Strep A Screen: NEGATIVE

## 2012-01-04 MED ORDER — AZITHROMYCIN 250 MG PO TABS: ORAL_TABLET | ORAL | Status: DC

## 2012-01-04 NOTE — Progress Notes (Signed)
SUBJECTIVE: 66 y.o. female with sore throat, myalgias, swollen glands, headache and fever for 3 days. No history of rheumatic fever. Other symptoms: myalgias and headache.  Patient Active Problem List  Diagnosis  . UNSPECIFIED HYPOTHYROIDISM  . DIABETES MELLITUS, TYPE II  . HYPERLIPIDEMIA  . GOUT  . GRIEF REACTION, ACUTE  . PERIPHERAL NEUROPATHY  . HYPERTENSION  . LEFT BUNDLE BRANCH BLOCK  . SINUSITIS, ACUTE  . ACUTE PHARYNGITIS  . ALLERGIC RHINITIS  . COPD  . GERD  . LOW BACK PAIN  . DYSURIA  . MYOCARDIAL INFARCTION, HX OF  . SINUSITIS, CHRONIC  . HEADACHE  . PALPITATIONS  . SHORTNESS OF BREATH  . CHEST PAIN UNSPECIFIED  . ECHOCARDIOGRAM, ABNORMAL  . ELECTROCARDIOGRAM, ABNORMAL  . ATYPICAL FACE PAIN  . RECTAL BLEEDING  . Vitamin D deficiency  . Left-sided chest wall pain  . Polyuria  . Hypothyroidism  . Sore throat   Past Medical History  Diagnosis Date  . Allergy   . Diabetes mellitus   . Gout   . Hyperlipidemia   . Hypertension   . COPD (chronic obstructive pulmonary disease)   . Peripheral neuropathy   . Low back pain    Past Surgical History  Procedure Date  . Polypectomy     Colon  . Abdominal hysterectomy   . Tubal ligation    History  Substance Use Topics  . Smoking status: Never Smoker   . Smokeless tobacco: Not on file  . Alcohol Use: No   Family History  Problem Relation Age of Onset  . Uterine cancer Mother    Allergies  Allergen Reactions  . Levofloxacin Hives   Current Outpatient Prescriptions on File Prior to Visit  Medication Sig Dispense Refill  . ACCU-CHEK FASTCLIX LANCETS MISC 1 Device by Does not apply route 3 (three) times daily. Use to check blood sugar up to three times a day  102 each  12  . albuterol (PROAIR HFA) 108 (90 BASE) MCG/ACT inhaler Inhale 2 puffs into the lungs every 4 (four) hours as needed.  1 Inhaler  5  . Alum & Mag Hydroxide-Simeth (MAGIC MOUTHWASH W/LIDOCAINE) SOLN Swish spit 5 ml four times daily as  needed.  240 mL  0  . diclofenac (FLECTOR) 1.3 % PTCH Place 1 patch onto the skin as directed.  60 patch  0  . diphenhydrAMINE (BENADRYL) 25 MG tablet Take 25 mg by mouth as needed.        Marland Kitchen esomeprazole (NEXIUM) 40 MG capsule 1-2 caps by mouth daily  180 capsule  3  . estrogens, conjugated, (PREMARIN) 0.625 MG tablet Take 1 tablet (0.625 mg total) by mouth daily.  30 tablet  5  . fexofenadine (ALLEGRA) 180 MG tablet Take 180 mg by mouth as needed.        . fluconazole (DIFLUCAN) 150 MG tablet Take one tablet by mouth as one dose  1 tablet  0  . fluticasone (FLONASE) 50 MCG/ACT nasal spray Place 2 sprays into the nose daily.  16 g  5  . furosemide (LASIX) 20 MG tablet Take 1 tablet (20 mg total) by mouth daily as needed.  30 tablet  4  . glipiZIDE (GLUCOTROL) 5 MG tablet Take 1 by mouth in the morning then 1/2 tablet in afternoon each day  45 tablet  5  . glucose blood (ACCU-CHEK AVIVA PLUS) test strip Use to test blood sugar up to three times daily  100 each  12  . glucose blood (PRODIGY  NO CODING BLOOD GLUC) test strip To test blood sugar 3 times a day  100 each  12  . hydrALAZINE (APRESOLINE) 25 MG tablet Take 1 tablet (25 mg total) by mouth 3 (three) times daily as needed.  30 tablet  3  . hydrOXYzine (ATARAX/VISTARIL) 50 MG tablet Take 1 tablet (50 mg total) by mouth 3 (three) times daily as needed.  90 tablet  0  . levothyroxine (SYNTHROID, LEVOTHROID) 75 MCG tablet Take 1 tablet (75 mcg total) by mouth daily.  30 tablet  5  . lovastatin (MEVACOR) 40 MG tablet Take 1 tablet (40 mg total) by mouth at bedtime.  30 tablet  5  . metoprolol (LOPRESSOR) 50 MG tablet Take 1 tablet (50 mg total) by mouth 2 (two) times daily. As needed for increased heart rate  60 tablet  3  . mometasone (ELOCON) 0.1 % cream Apply topically daily.  45 g  0  . nitroGLYCERIN (NITROSTAT) 0.4 MG SL tablet One tablet under tongue at onset of chest pain, you may repeat every 5 minutes for up to 3 doses  30 tablet  5  .  nortriptyline (PAMELOR) 50 MG capsule Take 1 capsule (50 mg total) by mouth at bedtime.  30 capsule  3  . nystatin cream (MYCOSTATIN) Apply 1 application topically 2 (two) times daily as needed.  30 g  3  . Olopatadine HCl (PATADAY) 0.2 % SOLN Apply one drop to each eye daily  3 Bottle  5  . Oxycodone HCl 10 MG TABS Take 1 tablet (10 mg total) by mouth 2 (two) times daily as needed.  60 each  0  . Oxycodone HCl 10 MG TABS Take 1 tablet (10 mg total) by mouth 2 (two) times daily as needed.  60 each  0  . pregabalin (LYRICA) 100 MG capsule Take 1 capsule (100 mg total) by mouth 3 (three) times daily.  90 capsule  3  . tiotropium (SPIRIVA HANDIHALER) 18 MCG inhalation capsule Place 1 capsule (18 mcg total) into inhaler and inhale daily.  30 capsule  5  . topiramate (TOPAMAX) 100 MG tablet Take 3 tablets by mouth daily.  90 tablet  4  . zolpidem (AMBIEN) 10 MG tablet Take one tablet by mouth at bedtime  30 tablet  4   The PMH, PSH, Social History, Family History, Medications, and allergies have been reviewed in [facility], and have been updated if relevant.  OBJECTIVE:  BP 142/88  Pulse 106  Temp 97.8 F (36.6 C) (Oral)  Wt 221 lb (100.245 kg)  SpO2 97%  Appears alert, well appearing, and in no distress. Ears: bilateral TM's and external ear canals normal Oropharynx: tonsils hypertrophied with exudate Neck: supple, no significant adenopathy Lungs: clear to auscultation, no wheezes, rales or rhonchi, symmetric air entry Rapid Strep test is present  ASSESSMENT: Streptococcal pharyngitis (with neg rapid strep) but has all symptoms classic for strep.  PLAN: Per orders. Gargle, use acetaminophen or other OTC analgesic, and take Rx fully as prescribed. Call if other family members develop similar symptoms. See prn.

## 2012-01-04 NOTE — Patient Instructions (Signed)
Good to see you. Please take Zpack as directed.  Call me next week if you are not feeling better.

## 2012-01-07 ENCOUNTER — Other Ambulatory Visit: Payer: Self-pay

## 2012-01-07 MED ORDER — MAGIC MOUTHWASH W/LIDOCAINE: ORAL | Status: DC

## 2012-01-07 MED ORDER — FLUCONAZOLE 150 MG PO TABS: ORAL_TABLET | ORAL | Status: DC

## 2012-01-07 NOTE — Telephone Encounter (Signed)
Pt seen 01/04/12; Z pak not working still has S/T requesting Augmentin instead of Z pak to [facility]. Pt also request Diflucan due to vaginal itching. Pt also request refill on magic mouthwash with lidocaine.

## 2012-01-07 NOTE — Telephone Encounter (Signed)
Rx for diflucan sent. Please give Zpack a little longer.  This may actually simply be a virus and continued use of multiple antibiotics not only won't help but lead to drug resistance.

## 2012-01-07 NOTE — Addendum Note (Signed)
Addended by: Dianne Dun on: 01/07/2012 01:44 PM   Modules accepted: Orders

## 2012-01-07 NOTE — Telephone Encounter (Signed)
Advised patient

## 2012-01-10 ENCOUNTER — Telehealth: Payer: Self-pay | Admitting: Family Medicine

## 2012-01-10 MED ORDER — AMOXICILLIN-POT CLAVULANATE 875-125 MG PO TABS: 1.0000 | ORAL_TABLET | Freq: Two times a day (BID) | ORAL | Status: DC

## 2012-01-10 NOTE — Telephone Encounter (Signed)
Advised patient that script has been sent to pharmacy. 

## 2012-01-10 NOTE — Telephone Encounter (Signed)
Patient Information:  Caller Name: [place]  Phone: [phone]  Patient: Mariah Reed, Mariah Reed  Gender: Female  DOB: Sep 20, 1945  Age: 66 Years  PCP: [name] [facility])   Symptoms  Reason For Call & Symptoms: Is calling because antibiotic not working.  Has taken a Z-Pak, but not any better  Reviewed Health History In EMR: Yes  Reviewed Medications In EMR: Yes  Reviewed Allergies In EMR: Yes  Reviewed Surgeries / Procedures: Yes  Date of Onset of Symptoms: 01/04/2012  Treatments Tried: Z-Pak  Treatments Tried Worked: No  Guideline(s) Used:  Colds  Sore Throat  Disposition Per Guideline:   See Today in Office  Reason For Disposition Reached:   Diabetes mellitus or weak immune system (e.g., HIV positive, cancer chemo, splenectomy, organ transplant, chronic steroids)  Advice Given:  Pain Medicines:  For pain relief, you can take either acetaminophen, ibuprofen, or naproxen.  Office Follow Up:  Does the office need to follow up with this patient?: Yes  Instructions For The Office: Please follow up with patient (she asked for [name]) regarding calling in Amoxicillin Clavulanate to her pharmacy.  Please not that patient states she has stopped using Sp[employer] and takes her blood pressure medication irregularly.  RN Overrode Recommendation:  Patient Requests Prescription  Patient is requesting Amoxicillin-Clavulanate be called in for her at [facility] at [phone].  RN Note:  Still having sore throat- red-white spots have improved; Rates pain as 6/10 pain scale. Nasal drainage and cough; Feels like she has a low grade temp, but has not taken; Is drinking.  Calls requesting message sent to [name], [name] nurse requesting Amoxicillin-Clavulanate.  Was seen on 01/04/12--according to last discussion with nurse something would be called in-not interested in appointment.

## 2012-01-10 NOTE — Telephone Encounter (Signed)
Rx sent 

## 2012-01-15 ENCOUNTER — Other Ambulatory Visit: Payer: Self-pay | Admitting: *Deleted

## 2012-01-15 MED ORDER — NYSTATIN 100000 UNIT/GM EX CREA: 1.0000 "application " | TOPICAL_CREAM | Freq: Two times a day (BID) | CUTANEOUS | Status: DC | PRN

## 2012-01-15 MED ORDER — HYDROXYZINE HCL 50 MG PO TABS: 50.0000 mg | ORAL_TABLET | Freq: Three times a day (TID) | ORAL | Status: DC | PRN

## 2012-01-15 NOTE — Telephone Encounter (Signed)
Faxed refill requests from [facility], both last filled 12/18/11.

## 2012-02-07 ENCOUNTER — Other Ambulatory Visit: Payer: Self-pay | Admitting: *Deleted

## 2012-02-07 MED ORDER — HYDROXYZINE HCL 50 MG PO TABS: 50.0000 mg | ORAL_TABLET | Freq: Three times a day (TID) | ORAL | Status: DC | PRN

## 2012-02-13 ENCOUNTER — Other Ambulatory Visit: Payer: Self-pay | Admitting: *Deleted

## 2012-02-13 MED ORDER — TOPIRAMATE 100 MG PO TABS: ORAL_TABLET | ORAL | Status: DC

## 2012-02-13 MED ORDER — ZOLPIDEM TARTRATE 10 MG PO TABS: ORAL_TABLET | ORAL | Status: DC

## 2012-02-13 MED ORDER — MAGIC MOUTHWASH W/LIDOCAINE: ORAL | Status: DC

## 2012-02-13 NOTE — Telephone Encounter (Signed)
Ambien called to [facility].

## 2012-02-29 ENCOUNTER — Telehealth: Payer: Self-pay | Admitting: *Deleted

## 2012-02-29 NOTE — Telephone Encounter (Signed)
Insurance is asking for documentation regarding patient's diabetes supplies, form is on your desk and needs your signature.

## 2012-03-03 NOTE — Telephone Encounter (Signed)
Form signed and on my desk. 

## 2012-03-11 ENCOUNTER — Encounter: Payer: Self-pay | Admitting: Family Medicine

## 2012-03-11 ENCOUNTER — Ambulatory Visit (INDEPENDENT_AMBULATORY_CARE_PROVIDER_SITE_OTHER): Payer: Medicare Other | Admitting: Family Medicine

## 2012-03-11 VITALS — BP 130/82 | HR 82 | Temp 97.9°F | Wt 222.0 lb

## 2012-03-11 DIAGNOSIS — E119 Type 2 diabetes mellitus without complications: Secondary | ICD-10-CM | POA: Diagnosis not present

## 2012-03-11 DIAGNOSIS — I10 Essential (primary) hypertension: Secondary | ICD-10-CM | POA: Diagnosis not present

## 2012-03-11 DIAGNOSIS — G609 Hereditary and idiopathic neuropathy, unspecified: Secondary | ICD-10-CM

## 2012-03-11 DIAGNOSIS — E039 Hypothyroidism, unspecified: Secondary | ICD-10-CM

## 2012-03-11 DIAGNOSIS — E559 Vitamin D deficiency, unspecified: Secondary | ICD-10-CM

## 2012-03-11 MED ORDER — HYDRALAZINE HCL 10 MG PO TABS: 10.0000 mg | ORAL_TABLET | Freq: Three times a day (TID) | ORAL | Status: DC | PRN

## 2012-03-11 MED ORDER — OXYCODONE HCL 10 MG PO TABS: 10.0000 mg | ORAL_TABLET | Freq: Two times a day (BID) | ORAL | Status: DC | PRN

## 2012-03-11 NOTE — Progress Notes (Signed)
67 yo with multiple medical problems here for follow up.  Hypothyroidism- still has some issues with dry skin.  This often happens in the winter.  Denies any other symptoms of hypo or hyperthyroidism. Lab Results  Component Value Date   TSH 0.91 12/10/2011    DM- CBGs have been trending up a little.  Admits to eating more carbohydrates because it is cheaper because she cannot physically cook. Does not want anyone to come to home to help- had a bad experience with home health and nursing aides.   On Glucotrol 5 mg daily.  CBGs having been running in low 100s fasting. No CBGs higher than 200.  Checks CBGs once daily. Lab Results  Component Value Date   HGBA1C 6.8* 12/10/2011   Chronic pain/neuropathy- under fair control. Oxycodone and nortriptyline have helped.     CP- less episodes of CP.  Takes NTG as needed.  Patient Active Problem List  Diagnosis  . DIABETES MELLITUS, TYPE II  . HYPERLIPIDEMIA  . GOUT  . PERIPHERAL NEUROPATHY  . HYPERTENSION  . LEFT BUNDLE BRANCH BLOCK  . ALLERGIC RHINITIS  . COPD  . GERD  . DYSURIA  . MYOCARDIAL INFARCTION, HX OF  . SINUSITIS, CHRONIC  . ECHOCARDIOGRAM, ABNORMAL  . Vitamin D deficiency  . Hypothyroidism   Past Medical History  Diagnosis Date  . Allergy   . Diabetes mellitus   . Gout   . Hyperlipidemia   . Hypertension   . COPD (chronic obstructive pulmonary disease)   . Peripheral neuropathy   . Low back pain    Past Surgical History  Procedure Date  . Polypectomy     Colon  . Abdominal hysterectomy   . Tubal ligation    History  Substance Use Topics  . Smoking status: Never Smoker   . Smokeless tobacco: Not on file  . Alcohol Use: No   Family History  Problem Relation Age of Onset  . Uterine cancer Mother    Allergies  Allergen Reactions  . Levofloxacin Hives   Current Outpatient Prescriptions on File Prior to Visit  Medication Sig Dispense Refill  . ACCU-CHEK FASTCLIX LANCETS MISC 1 Device by Does not  apply route 3 (three) times daily. Use to check blood sugar up to three times a day  102 each  12  . albuterol (PROAIR HFA) 108 (90 BASE) MCG/ACT inhaler Inhale 2 puffs into the lungs every 4 (four) hours as needed.  1 Inhaler  5  . Alum & Mag Hydroxide-Simeth (MAGIC MOUTHWASH W/LIDOCAINE) SOLN Swish spit 5 ml four times daily as needed.  240 mL  0  . amoxicillin-clavulanate (AUGMENTIN) 875-125 MG per tablet Take 1 tablet by mouth 2 (two) times daily.  20 tablet  0  . azithromycin (ZITHROMAX) 250 MG tablet 2 tabs by mouth on day 1 followed by 1 tab by mouth daily days 2-5  6 tablet  0  . diclofenac (FLECTOR) 1.3 % PTCH Place 1 patch onto the skin as directed.  60 patch  0  . diphenhydrAMINE (BENADRYL) 25 MG tablet Take 25 mg by mouth as needed.        Marland Kitchen esomeprazole (NEXIUM) 40 MG capsule 1-2 caps by mouth daily  180 capsule  3  . estrogens, conjugated, (PREMARIN) 0.625 MG tablet Take 1 tablet (0.625 mg total) by mouth daily.  30 tablet  5  . fexofenadine (ALLEGRA) 180 MG tablet Take 180 mg by mouth as needed.        . fluconazole (  DIFLUCAN) 150 MG tablet Take one tablet by mouth as one dose  1 tablet  0  . fluticasone (FLONASE) 50 MCG/ACT nasal spray Place 2 sprays into the nose daily.  16 g  5  . furosemide (LASIX) 20 MG tablet Take 1 tablet (20 mg total) by mouth daily as needed.  30 tablet  4  . glipiZIDE (GLUCOTROL) 5 MG tablet Take 1 by mouth in the morning then 1/2 tablet in afternoon each day  45 tablet  5  . glucose blood (ACCU-CHEK AVIVA PLUS) test strip Use to test blood sugar up to three times daily  100 each  12  . glucose blood (PRODIGY NO CODING BLOOD GLUC) test strip To test blood sugar 3 times a day  100 each  12  . hydrALAZINE (APRESOLINE) 25 MG tablet Take 1 tablet (25 mg total) by mouth 3 (three) times daily as needed.  30 tablet  3  . hydrOXYzine (ATARAX/VISTARIL) 50 MG tablet Take 1 tablet (50 mg total) by mouth 3 (three) times daily as needed.  90 tablet  1  . levothyroxine  (SYNTHROID, LEVOTHROID) 75 MCG tablet Take 1 tablet (75 mcg total) by mouth daily.  30 tablet  5  . lovastatin (MEVACOR) 40 MG tablet Take 1 tablet (40 mg total) by mouth at bedtime.  30 tablet  5  . metoprolol (LOPRESSOR) 50 MG tablet Take 1 tablet (50 mg total) by mouth 2 (two) times daily. As needed for increased heart rate  60 tablet  3  . mometasone (ELOCON) 0.1 % cream Apply topically daily.  45 g  0  . nitroGLYCERIN (NITROSTAT) 0.4 MG SL tablet One tablet under tongue at onset of chest pain, you may repeat every 5 minutes for up to 3 doses  30 tablet  5  . nortriptyline (PAMELOR) 50 MG capsule Take 1 capsule (50 mg total) by mouth at bedtime.  30 capsule  3  . nystatin cream (MYCOSTATIN) Apply 1 application topically 2 (two) times daily as needed.  30 g  3  . Olopatadine HCl (PATADAY) 0.2 % SOLN Apply one drop to each eye daily  3 Bottle  5  . Oxycodone HCl 10 MG TABS Take 1 tablet (10 mg total) by mouth 2 (two) times daily as needed.  60 each  0  . Oxycodone HCl 10 MG TABS Take 1 tablet (10 mg total) by mouth 2 (two) times daily as needed.  60 each  0  . pregabalin (LYRICA) 100 MG capsule Take 1 capsule (100 mg total) by mouth 3 (three) times daily.  90 capsule  3  . tiotropium (SPIRIVA HANDIHALER) 18 MCG inhalation capsule Place 1 capsule (18 mcg total) into inhaler and inhale daily.  30 capsule  5  . topiramate (TOPAMAX) 100 MG tablet Take 3 tablets by mouth daily.  90 tablet  6  . zolpidem (AMBIEN) 10 MG tablet Take one tablet by mouth at bedtime  30 tablet  4     The PMH, PSH, Social History, Family History, Medications, and allergies have been reviewed in [facility], and have been updated if relevant.  Review of Systems       See HPI   Physical Exam BP 130/82  Pulse 82  Temp 97.9 F (36.6 C)  Wt 222 lb (100.699 kg)  General:  alert and overweight-appearing, no acute distress.   Head:  normocephalic, atraumatic, and no abnormalities observed.   Eyes:  vision grossly intact,  pupils equal, pupils round, and pupils  reactive to light.   Ears:  R ear normal and L ear normal.   Nose:  no external deformity.   Mouth:  pharynx pink and moist.   Neck:  No deformities, masses, or tenderness noted. Lungs:  Normal respiratory effort, chest expands symmetrically. Lungs are clear to auscultation, no crackles or wheezes. Heart:  Normal rate and regular rhythm. S1 and S2 normal without gallop, murmur, click, rub or other extra sounds. Abdomen:  Bowel sounds positive,abdomen soft and non-tender without masses, organomegaly or hernias noted. Msk:  gross deformity of left arm/hand. Pulses:  R and L carotid,radial,femoral,dorsalis pedis and posterior tibial pulses are full and equal bilaterally Extremities:  no edema Diabetic foot exam: Normal inspection No skin breakdown No calluses  Normal DP pulses Normal sensation to light touch and monofilament Nails normal  Neurologic:  alert & oriented X3 and gait normal.   Skin:  Intact without suspicious lesions or rashes Psych:  Oriented X3 and good eye contact.    Assessment and Plan: 1. DM Stable. Continue current meds.   2. Chronic pain Stable. Refilled her oxycodone today.   3. Hypothyroidism  Stable on current meds.

## 2012-03-11 NOTE — Patient Instructions (Addendum)
Good to see you. We are changing your hydralazine to 10 mg tablets.  Please come see me in 3 months.

## 2012-03-14 ENCOUNTER — Telehealth: Payer: Self-pay | Admitting: *Deleted

## 2012-03-14 NOTE — Telephone Encounter (Signed)
Insurance is requiring explanation as to why pt needs twice a day dosing.  Form is on your desk.

## 2012-03-14 NOTE — Telephone Encounter (Signed)
Prior Mariah Reed is needed for nexium, for 2 a day dosing.  Form is on your desk.

## 2012-03-14 NOTE — Telephone Encounter (Signed)
Form completed and on my desk. 

## 2012-03-14 NOTE — Telephone Encounter (Signed)
Completed form faxed back to [employer].

## 2012-03-14 NOTE — Telephone Encounter (Signed)
Form signed and on my desk. 

## 2012-03-17 NOTE — Telephone Encounter (Signed)
Prior auth given for nexium, approval letter placed on doctor's desk for signature and scanning.

## 2012-04-03 ENCOUNTER — Encounter: Payer: Self-pay | Admitting: Family Medicine

## 2012-04-03 ENCOUNTER — Ambulatory Visit (INDEPENDENT_AMBULATORY_CARE_PROVIDER_SITE_OTHER): Payer: Medicare Other | Admitting: Family Medicine

## 2012-04-03 VITALS — BP 130/82 | HR 72 | Temp 97.8°F | Wt 223.0 lb

## 2012-04-03 DIAGNOSIS — E119 Type 2 diabetes mellitus without complications: Secondary | ICD-10-CM | POA: Diagnosis not present

## 2012-04-03 NOTE — Progress Notes (Signed)
67 yo with multiple medical problems here to discuss headaches.    Chronic headaches- migraines and tension- was managed by neurology. On amitryptline and Topamax 300 mg daily. HA are now daily and oxycodone does not help.  HA NOT associated with photophobia, nausea or vomiting.  HA are "all over," she cannot localize one area.  Patient Active Problem List  Diagnosis  . DIABETES MELLITUS, TYPE II  . HYPERLIPIDEMIA  . GOUT  . PERIPHERAL NEUROPATHY  . HYPERTENSION  . LEFT BUNDLE BRANCH BLOCK  . ALLERGIC RHINITIS  . COPD  . GERD  . DYSURIA  . MYOCARDIAL INFARCTION, HX OF  . SINUSITIS, CHRONIC  . ECHOCARDIOGRAM, ABNORMAL  . Vitamin D deficiency  . Hypothyroidism   Past Medical History  Diagnosis Date  . Allergy   . Diabetes mellitus   . Gout   . Hyperlipidemia   . Hypertension   . COPD (chronic obstructive pulmonary disease)   . Peripheral neuropathy   . Low back pain    Past Surgical History  Procedure Laterality Date  . Polypectomy      Colon  . Abdominal hysterectomy    . Tubal ligation     History  Substance Use Topics  . Smoking status: Never Smoker   . Smokeless tobacco: Not on file  . Alcohol Use: No   Family History  Problem Relation Age of Onset  . Uterine cancer Mother    Allergies  Allergen Reactions  . Levofloxacin Hives   Current Outpatient Prescriptions on File Prior to Visit  Medication Sig Dispense Refill  . ACCU-CHEK FASTCLIX LANCETS MISC 1 Device by Does not apply route 3 (three) times daily. Use to check blood sugar up to three times a day  102 each  12  . albuterol (PROAIR HFA) 108 (90 BASE) MCG/ACT inhaler Inhale 2 puffs into the lungs every 4 (four) hours as needed.  1 Inhaler  5  . diclofenac (FLECTOR) 1.3 % PTCH Place 1 patch onto the skin as directed.  60 patch  0  . diphenhydrAMINE (BENADRYL) 25 MG tablet Take 25 mg by mouth as needed.        Marland Kitchen esomeprazole (NEXIUM) 40 MG capsule 1-2 caps by mouth daily  180 capsule  3  .  estrogens, conjugated, (PREMARIN) 0.625 MG tablet Take 1 tablet (0.625 mg total) by mouth daily.  30 tablet  5  . fexofenadine (ALLEGRA) 180 MG tablet Take 180 mg by mouth as needed.        . fluconazole (DIFLUCAN) 150 MG tablet Take one tablet by mouth as one dose  1 tablet  0  . fluticasone (FLONASE) 50 MCG/ACT nasal spray Place 2 sprays into the nose daily.  16 g  5  . furosemide (LASIX) 20 MG tablet Take 1 tablet (20 mg total) by mouth daily as needed.  30 tablet  4  . glipiZIDE (GLUCOTROL) 5 MG tablet Take 1 by mouth in the morning then 1/2 tablet in afternoon each day  45 tablet  5  . glucose blood (ACCU-CHEK AVIVA PLUS) test strip Use to test blood sugar up to three times daily  100 each  12  . glucose blood (PRODIGY NO CODING BLOOD GLUC) test strip To test blood sugar 3 times a day  100 each  12  . hydrALAZINE (APRESOLINE) 10 MG tablet Take 1 tablet (10 mg total) by mouth 3 (three) times daily as needed.  30 tablet  3  . hydrOXYzine (ATARAX/VISTARIL) 50 MG tablet Take  1 tablet (50 mg total) by mouth 3 (three) times daily as needed.  90 tablet  1  . levothyroxine (SYNTHROID, LEVOTHROID) 75 MCG tablet Take 1 tablet (75 mcg total) by mouth daily.  30 tablet  5  . lovastatin (MEVACOR) 40 MG tablet Take 1 tablet (40 mg total) by mouth at bedtime.  30 tablet  5  . metoprolol (LOPRESSOR) 50 MG tablet Take 1 tablet (50 mg total) by mouth 2 (two) times daily. As needed for increased heart rate  60 tablet  3  . mometasone (ELOCON) 0.1 % cream Apply topically daily.  45 g  0  . nitroGLYCERIN (NITROSTAT) 0.4 MG SL tablet One tablet under tongue at onset of chest pain, you may repeat every 5 minutes for up to 3 doses  30 tablet  5  . nortriptyline (PAMELOR) 50 MG capsule Take 1 capsule (50 mg total) by mouth at bedtime.  30 capsule  3  . nystatin cream (MYCOSTATIN) Apply 1 application topically 2 (two) times daily as needed.  30 g  3  . Olopatadine HCl (PATADAY) 0.2 % SOLN Apply one drop to each eye  daily  3 Bottle  5  . Oxycodone HCl 10 MG TABS Take 1 tablet (10 mg total) by mouth 2 (two) times daily as needed.  60 each  0  . Oxycodone HCl 10 MG TABS Take 1 tablet (10 mg total) by mouth 2 (two) times daily as needed.  60 each  0  . pregabalin (LYRICA) 100 MG capsule Take 1 capsule (100 mg total) by mouth 3 (three) times daily.  90 capsule  3  . tiotropium (SPIRIVA HANDIHALER) 18 MCG inhalation capsule Place 1 capsule (18 mcg total) into inhaler and inhale daily.  30 capsule  5  . topiramate (TOPAMAX) 100 MG tablet Take 3 tablets by mouth daily.  90 tablet  6  . zolpidem (AMBIEN) 10 MG tablet Take one tablet by mouth at bedtime  30 tablet  4   No current facility-administered medications on file prior to visit.     The PMH, PSH, Social History, Family History, Medications, and allergies have been reviewed in [facility], and have been updated if relevant.  Review of Systems       See HPI No falls No blurred vision   Physical Exam BP 130/82  Pulse 72  Temp(Src) 97.8 F (36.6 C)  Wt 223 lb (101.152 kg)  BMI 36.01 kg/m2  General:  alert and overweight-appearing, no acute distress.   Head:  normocephalic, atraumatic, and no abnormalities observed.   Eyes:  vision grossly intact, pupils equal, pupils round, and pupils reactive to light.   Psych:  Oriented X3 and good eye contact.    Assessment and Plan:  1. Chronic headaches Deteriorated. >25 min spent with face to face with patient, >50% counseling and/or coordinating care. She is asking for morphine which I explained to her that I will not prescribe and I do not think it would be of any added benefit-she already receives oxycontin. On max dose of topamax. I offered to add a muscle relaxant or benzo but she refused. I strongly urged her to see neurology but she refused.

## 2012-04-08 ENCOUNTER — Other Ambulatory Visit: Payer: Self-pay | Admitting: *Deleted

## 2012-04-08 MED ORDER — NORTRIPTYLINE HCL 50 MG PO CAPS: 50.0000 mg | ORAL_CAPSULE | Freq: Every day | ORAL | Status: DC

## 2012-04-08 NOTE — Telephone Encounter (Signed)
Faxed refill request from [facility], last filled 02/12/12.

## 2012-05-06 ENCOUNTER — Other Ambulatory Visit: Payer: Self-pay | Admitting: *Deleted

## 2012-05-06 MED ORDER — NORTRIPTYLINE HCL 50 MG PO CAPS: 50.0000 mg | ORAL_CAPSULE | Freq: Every day | ORAL | Status: DC

## 2012-05-06 MED ORDER — HYDRALAZINE HCL 10 MG PO TABS: 10.0000 mg | ORAL_TABLET | Freq: Three times a day (TID) | ORAL | Status: DC | PRN

## 2012-05-06 NOTE — Telephone Encounter (Signed)
Last filled 04/08/12

## 2012-05-07 ENCOUNTER — Other Ambulatory Visit: Payer: Self-pay | Admitting: *Deleted

## 2012-05-07 MED ORDER — PREGABALIN 100 MG PO CAPS: 100.0000 mg | ORAL_CAPSULE | Freq: Three times a day (TID) | ORAL | Status: DC

## 2012-05-07 MED ORDER — NYSTATIN 100000 UNIT/GM EX CREA: 1.0000 "application " | TOPICAL_CREAM | Freq: Two times a day (BID) | CUTANEOUS | Status: DC | PRN

## 2012-05-07 NOTE — Addendum Note (Signed)
Addended by: Baldomero Lamy on: 05/07/2012 01:31 PM   Modules accepted: Orders

## 2012-05-07 NOTE — Telephone Encounter (Signed)
Lyrica called to [facility].

## 2012-05-07 NOTE — Telephone Encounter (Signed)
Last filled. 02/12/12

## 2012-06-03 ENCOUNTER — Other Ambulatory Visit: Payer: Self-pay | Admitting: *Deleted

## 2012-06-03 MED ORDER — NORTRIPTYLINE HCL 50 MG PO CAPS: 50.0000 mg | ORAL_CAPSULE | Freq: Every day | ORAL | Status: DC

## 2012-06-05 ENCOUNTER — Other Ambulatory Visit: Payer: Self-pay | Admitting: *Deleted

## 2012-06-05 MED ORDER — MAGIC MOUTHWASH W/LIDOCAINE: ORAL | Status: DC

## 2012-06-05 NOTE — Telephone Encounter (Signed)
Last filled 02/13/12

## 2012-06-09 ENCOUNTER — Encounter: Payer: Self-pay | Admitting: Family Medicine

## 2012-06-09 ENCOUNTER — Ambulatory Visit (INDEPENDENT_AMBULATORY_CARE_PROVIDER_SITE_OTHER): Payer: Medicare Other | Admitting: Family Medicine

## 2012-06-09 VITALS — BP 140/88 | HR 76 | Temp 97.9°F | Wt 217.0 lb

## 2012-06-09 DIAGNOSIS — R51 Headache: Secondary | ICD-10-CM

## 2012-06-09 DIAGNOSIS — E119 Type 2 diabetes mellitus without complications: Secondary | ICD-10-CM | POA: Diagnosis not present

## 2012-06-09 DIAGNOSIS — E039 Hypothyroidism, unspecified: Secondary | ICD-10-CM

## 2012-06-09 DIAGNOSIS — J309 Allergic rhinitis, unspecified: Secondary | ICD-10-CM

## 2012-06-09 DIAGNOSIS — B370 Candidal stomatitis: Secondary | ICD-10-CM

## 2012-06-09 DIAGNOSIS — G609 Hereditary and idiopathic neuropathy, unspecified: Secondary | ICD-10-CM | POA: Diagnosis not present

## 2012-06-09 DIAGNOSIS — I10 Essential (primary) hypertension: Secondary | ICD-10-CM | POA: Diagnosis not present

## 2012-06-09 LAB — HEMOGLOBIN A1C: Hgb A1c MFr Bld: 6.6 % — ABNORMAL HIGH (ref 4.6–6.5)

## 2012-06-09 MED ORDER — OXYCODONE HCL 10 MG PO TABS: 10.0000 mg | ORAL_TABLET | Freq: Two times a day (BID) | ORAL | Status: DC | PRN

## 2012-06-09 NOTE — Progress Notes (Signed)
67 yo with multiple medical problems here for follow up.  She is most concerned with allergic rhinitis.  Has failed multiple antihistamines and has seen an allergist.  Has had trauma to her nose/deviated septum.  Hypothyroidism- does complain of skin feeling drier lately.  Denies any other symptoms of hypo or hyperthyroidism. Lab Results  Component Value Date   TSH 0.91 12/10/2011    DM- CBGs stable.  On Glucotrol 5 mg daily.  CBGs no higher than 300.  Checks CBG 2-3 times daily.  Checks CBGs once daily.  Feels she is getting thrush recurrently. Lab Results  Component Value Date   HGBA1C 6.8* 12/10/2011   Chronic pain- managing ok, Oxycodone and nortriptyline have helped.  Taking care of her husband who is also disabled.   CP- less episodes of CP.  Takes NTG as needed.  Patient Active Problem List   Diagnosis Date Noted  . Chronic headaches 04/03/2012  . Hypothyroidism 09/18/2010  . Vitamin D deficiency 06/14/2010  . ECHOCARDIOGRAM, ABNORMAL 03/16/2010  . SINUSITIS, CHRONIC 03/15/2010  . MYOCARDIAL INFARCTION, HX OF 02/09/2010  . LEFT BUNDLE BRANCH BLOCK 12/26/2009  . GERD 12/26/2009  . DYSURIA 12/15/2009  . COPD 11/10/2009  . DIABETES MELLITUS, TYPE II 03/12/2006  . HYPERLIPIDEMIA 03/12/2006  . GOUT 03/12/2006  . PERIPHERAL NEUROPATHY 03/12/2006  . HYPERTENSION 03/12/2006  . ALLERGIC RHINITIS 03/12/2006   Past Medical History  Diagnosis Date  . Allergy   . Diabetes mellitus   . Gout   . Hyperlipidemia   . Hypertension   . COPD (chronic obstructive pulmonary disease)   . Peripheral neuropathy   . Low back pain    Past Surgical History  Procedure Laterality Date  . Polypectomy      Colon  . Abdominal hysterectomy    . Tubal ligation     History  Substance Use Topics  . Smoking status: Never Smoker   . Smokeless tobacco: Not on file  . Alcohol Use: No   Family History  Problem Relation Age of Onset  . Uterine cancer Mother    Allergies  Allergen  Reactions  . Levofloxacin Hives   Current Outpatient Prescriptions on File Prior to Visit  Medication Sig Dispense Refill  . ACCU-CHEK FASTCLIX LANCETS MISC 1 Device by Does not apply route 3 (three) times daily. Use to check blood sugar up to three times a day  102 each  12  . albuterol (PROAIR HFA) 108 (90 BASE) MCG/ACT inhaler Inhale 2 puffs into the lungs every 4 (four) hours as needed.  1 Inhaler  5  . Alum & Mag Hydroxide-Simeth (MAGIC MOUTHWASH W/LIDOCAINE) SOLN Swish spit 5 ml four times daily as needed.  240 mL  0  . diclofenac (FLECTOR) 1.3 % PTCH Place 1 patch onto the skin as directed.  60 patch  0  . diphenhydrAMINE (BENADRYL) 25 MG tablet Take 25 mg by mouth as needed.        Marland Kitchen esomeprazole (NEXIUM) 40 MG capsule 1-2 caps by mouth daily  180 capsule  3  . estrogens, conjugated, (PREMARIN) 0.625 MG tablet Take 1 tablet (0.625 mg total) by mouth daily.  30 tablet  5  . fexofenadine (ALLEGRA) 180 MG tablet Take 180 mg by mouth as needed.        . fluconazole (DIFLUCAN) 150 MG tablet Take one tablet by mouth as one dose  1 tablet  0  . fluticasone (FLONASE) 50 MCG/ACT nasal spray Place 2 sprays into the nose daily.  16 g  5  . furosemide (LASIX) 20 MG tablet Take 1 tablet (20 mg total) by mouth daily as needed.  30 tablet  4  . glipiZIDE (GLUCOTROL) 5 MG tablet Take 1 by mouth in the morning then 1/2 tablet in afternoon each day  45 tablet  5  . glucose blood (ACCU-CHEK AVIVA PLUS) test strip Use to test blood sugar up to three times daily  100 each  12  . glucose blood (PRODIGY NO CODING BLOOD GLUC) test strip To test blood sugar 3 times a day  100 each  12  . hydrALAZINE (APRESOLINE) 10 MG tablet Take 1 tablet (10 mg total) by mouth 3 (three) times daily as needed.  30 tablet  3  . hydrOXYzine (ATARAX/VISTARIL) 50 MG tablet Take 1 tablet (50 mg total) by mouth 3 (three) times daily as needed.  90 tablet  1  . levothyroxine (SYNTHROID, LEVOTHROID) 75 MCG tablet Take 1 tablet (75 mcg  total) by mouth daily.  30 tablet  5  . lovastatin (MEVACOR) 40 MG tablet Take 1 tablet (40 mg total) by mouth at bedtime.  30 tablet  5  . metoprolol (LOPRESSOR) 50 MG tablet Take 1 tablet (50 mg total) by mouth 2 (two) times daily. As needed for increased heart rate  60 tablet  3  . nitroGLYCERIN (NITROSTAT) 0.4 MG SL tablet One tablet under tongue at onset of chest pain, you may repeat every 5 minutes for up to 3 doses  30 tablet  5  . nortriptyline (PAMELOR) 50 MG capsule Take 1 capsule (50 mg total) by mouth at bedtime.  30 capsule  0  . nystatin cream (MYCOSTATIN) Apply 1 application topically 2 (two) times daily as needed.  30 g  3  . Olopatadine HCl (PATADAY) 0.2 % SOLN Apply one drop to each eye daily  3 Bottle  5  . Oxycodone HCl 10 MG TABS Take 1 tablet (10 mg total) by mouth 2 (two) times daily as needed.  60 each  0  . Oxycodone HCl 10 MG TABS Take 1 tablet (10 mg total) by mouth 2 (two) times daily as needed.  60 each  0  . pregabalin (LYRICA) 100 MG capsule Take 1 capsule (100 mg total) by mouth 3 (three) times daily.  90 capsule  3  . tiotropium (SPIRIVA HANDIHALER) 18 MCG inhalation capsule Place 1 capsule (18 mcg total) into inhaler and inhale daily.  30 capsule  5  . topiramate (TOPAMAX) 100 MG tablet Take 3 tablets by mouth daily.  90 tablet  6  . zolpidem (AMBIEN) 10 MG tablet Take one tablet by mouth at bedtime  30 tablet  4   No current facility-administered medications on file prior to visit.     The PMH, PSH, Social History, Family History, Medications, and allergies have been reviewed in [facility], and have been updated if relevant.  Review of Systems       See HPI   Physical Exam BP 140/88  Pulse 76  Temp(Src) 97.9 F (36.6 C)  Wt 217 lb (98.431 kg)  BMI 35.04 kg/m2  General:  alert and overweight-appearing, no acute distress.   Head:  normocephalic, atraumatic, and no abnormalities observed.   Eyes:  vision grossly intact, pupils equal, pupils round, and pupils  reactive to light.   Ears:  R ear normal and L ear normal.   Nose:  no external deformity +deviated septum.   Mouth:  pharynx pink and moist.  Neck:  No deformities, masses, or tenderness noted. Lungs:  Normal respiratory effort, chest expands symmetrically. Lungs are clear to auscultation, no crackles or wheezes. Heart:  Normal rate and regular rhythm. S1 and S2 normal without gallop, murmur, click, rub or other extra sounds. Abdomen:  Bowel sounds positive,abdomen soft and non-tender without masses, organomegaly or hernias noted. Msk:  gross deformity of left arm/hand. Pulses:  R and L carotid,radial,femoral,dorsalis pedis and posterior tibial pulses are full and equal bilaterally Extremities:  no edema Diabetic foot exam: Normal inspection No skin breakdown No calluses  Normal DP pulses Normal sensation to light touch and monofilament Nails normal  Neurologic:  alert & oriented X3 and gait normal.   Skin:  Intact without suspicious lesions or rashes Psych:  Oriented X3 and good eye contact.    Assessment and Plan: 1. Hypothyroidism Recheck labs today. - TSH  2. HYPERTENSION Well controlled.  3. PERIPHERAL NEUROPATHY Pain meds seem to be working at current dose.  4. DIABETES MELLITUS, TYPE II Recheck labs today. - Hemoglobin A1c - Comprehensive metabolic panel  5. Thrush No evidence of thrush today.  6. Allergic rhinitis Deteriorated.  Refer to ENT. - Ambulatory referral to ENT  7. Chronic headaches Likely due to #6.

## 2012-06-09 NOTE — Patient Instructions (Addendum)
Please stop by to see Mariah Reed on your way out to set up your referral.   

## 2012-06-10 LAB — COMPREHENSIVE METABOLIC PANEL
AST: 23 U/L (ref 0–37)
BUN: 11 mg/dL (ref 6–23)
CO2: 27 mEq/L (ref 19–32)
Calcium: 9.2 mg/dL (ref 8.4–10.5)
Chloride: 106 mEq/L (ref 96–112)
Creatinine, Ser: 1.1 mg/dL (ref 0.4–1.2)
GFR: 55.44 mL/min — ABNORMAL LOW (ref >60.00)
Total Bilirubin: 0.5 mg/dL (ref 0.3–1.2)

## 2012-06-13 ENCOUNTER — Telehealth: Payer: Self-pay | Admitting: *Deleted

## 2012-06-13 NOTE — Telephone Encounter (Signed)
Pt is asking what you are going to do about the thrush in her mouth.  She says she now has a new spot.  The magic mouthwash that you gave her helps the pain, but doesn't make the thrush go away.  She wants something sent to [facility] that will make it go away.  Please advise.

## 2012-06-16 MED ORDER — NYSTATIN 100000 UNIT/ML MT SUSP: 500000.0000 [IU] | Freq: Four times a day (QID) | OROMUCOSAL | Status: DC

## 2012-06-16 NOTE — Telephone Encounter (Signed)
Let's try a nystatin rinse.  Rx sent.

## 2012-06-16 NOTE — Telephone Encounter (Signed)
Left message on voice mail advising patient script has been sent to pharmacy. 

## 2012-06-25 DIAGNOSIS — J3489 Other specified disorders of nose and nasal sinuses: Secondary | ICD-10-CM | POA: Diagnosis not present

## 2012-06-25 DIAGNOSIS — J301 Allergic rhinitis due to pollen: Secondary | ICD-10-CM | POA: Diagnosis not present

## 2012-06-25 DIAGNOSIS — J305 Allergic rhinitis due to food: Secondary | ICD-10-CM | POA: Diagnosis not present

## 2012-06-26 ENCOUNTER — Ambulatory Visit: Payer: Self-pay | Admitting: Unknown Physician Specialty

## 2012-06-26 DIAGNOSIS — J3489 Other specified disorders of nose and nasal sinuses: Secondary | ICD-10-CM | POA: Diagnosis not present

## 2012-07-02 ENCOUNTER — Other Ambulatory Visit: Payer: Self-pay | Admitting: Family Medicine

## 2012-07-02 NOTE — Telephone Encounter (Signed)
Ambien called to [place]. 

## 2012-07-30 ENCOUNTER — Other Ambulatory Visit: Payer: Self-pay | Admitting: Family Medicine

## 2012-07-30 NOTE — Telephone Encounter (Signed)
Ambien called to [facility].

## 2012-08-26 ENCOUNTER — Other Ambulatory Visit: Payer: Self-pay | Admitting: Family Medicine

## 2012-08-27 NOTE — Telephone Encounter (Signed)
Refills called to [place]. 

## 2012-09-09 ENCOUNTER — Encounter: Payer: Self-pay | Admitting: Family Medicine

## 2012-09-09 ENCOUNTER — Ambulatory Visit (INDEPENDENT_AMBULATORY_CARE_PROVIDER_SITE_OTHER): Payer: Medicare Other | Admitting: Family Medicine

## 2012-09-09 VITALS — BP 118/82 | HR 84 | Temp 97.9°F | Ht 66.0 in | Wt 219.0 lb

## 2012-09-09 DIAGNOSIS — Z136 Encounter for screening for cardiovascular disorders: Secondary | ICD-10-CM | POA: Diagnosis not present

## 2012-09-09 DIAGNOSIS — Z639 Problem related to primary support group, unspecified: Secondary | ICD-10-CM | POA: Diagnosis not present

## 2012-09-09 DIAGNOSIS — E119 Type 2 diabetes mellitus without complications: Secondary | ICD-10-CM

## 2012-09-09 DIAGNOSIS — Z638 Other specified problems related to primary support group: Secondary | ICD-10-CM | POA: Insufficient documentation

## 2012-09-09 DIAGNOSIS — B370 Candidal stomatitis: Secondary | ICD-10-CM

## 2012-09-09 LAB — COMPREHENSIVE METABOLIC PANEL
AST: 17 U/L (ref 0–37)
Albumin: 4.2 g/dL (ref 3.5–5.2)
BUN: 8 mg/dL (ref 6–23)
CO2: 26 mEq/L (ref 19–32)
Calcium: 9 mg/dL (ref 8.4–10.5)
Chloride: 106 mEq/L (ref 96–112)
Creatinine, Ser: 1 mg/dL (ref 0.4–1.2)
GFR: 57.94 mL/min — ABNORMAL LOW (ref >60.00)
Glucose, Bld: 193 mg/dL — ABNORMAL HIGH (ref 70–99)

## 2012-09-09 LAB — LIPID PANEL
Cholesterol: 158 mg/dL (ref 0–200)
HDL: 52.5 mg/dL (ref >39.00)
Triglycerides: 149 mg/dL (ref 0.0–149.0)

## 2012-09-09 MED ORDER — OXYCODONE HCL 10 MG PO TABS: 10.0000 mg | ORAL_TABLET | Freq: Two times a day (BID) | ORAL | Status: DC | PRN

## 2012-09-09 NOTE — Progress Notes (Signed)
67 yo with multiple medical problems here for follow up.   Hypothyroidism- does complain of skin feeling drier lately.  Denies any other symptoms of hypo or hyperthyroidism. Lab Results  Component Value Date   TSH 2.55 06/09/2012    DM- CBGs stable.  On Glucotrol 5 mg daily.  CBGs no higher than 223.  Checks CBG 2-3 times daily.  Checks CBGs once daily.  Feels she is getting thrush recurrently. Lab Results  Component Value Date   HGBA1C 6.6* 06/09/2012   Chronic pain- managing ok, Oxycodone and nortriptyline have helped.  Taking care of her husband who is also disabled.  CP- less episodes of CP.  Takes NTG as needed.  Did have a bad episode this weekend. She thinks this was due to issues ongoing with her family right now.  She is worried about her children.   Patient Active Problem List   Diagnosis Date Noted  . Thrush 06/09/2012  . Chronic headaches 04/03/2012  . Hypothyroidism 09/18/2010  . Vitamin D deficiency 06/14/2010  . ECHOCARDIOGRAM, ABNORMAL 03/16/2010  . SINUSITIS, CHRONIC 03/15/2010  . MYOCARDIAL INFARCTION, HX OF 02/09/2010  . LEFT BUNDLE BRANCH BLOCK 12/26/2009  . GERD 12/26/2009  . COPD 11/10/2009  . DIABETES MELLITUS, TYPE II 03/12/2006  . HYPERLIPIDEMIA 03/12/2006  . GOUT 03/12/2006  . PERIPHERAL NEUROPATHY 03/12/2006  . HYPERTENSION 03/12/2006  . ALLERGIC RHINITIS 03/12/2006   Past Medical History  Diagnosis Date  . Allergy   . Diabetes mellitus   . Gout   . Hyperlipidemia   . Hypertension   . COPD (chronic obstructive pulmonary disease)   . Peripheral neuropathy   . Low back pain    Past Surgical History  Procedure Laterality Date  . Polypectomy      Colon  . Abdominal hysterectomy    . Tubal ligation     History  Substance Use Topics  . Smoking status: Never Smoker   . Smokeless tobacco: Not on file  . Alcohol Use: No   Family History  Problem Relation Age of Onset  . Uterine cancer Mother    Allergies  Allergen Reactions  .  Levofloxacin Hives   Current Outpatient Prescriptions on File Prior to Visit  Medication Sig Dispense Refill  . ACCU-CHEK FASTCLIX LANCETS MISC 1 Device by Does not apply route 3 (three) times daily. Use to check blood sugar up to three times a day  102 each  12  . albuterol (PROAIR HFA) 108 (90 BASE) MCG/ACT inhaler Inhale 2 puffs into the lungs every 4 (four) hours as needed.  1 Inhaler  5  . Alum & Mag Hydroxide-Simeth (MAGIC MOUTHWASH W/LIDOCAINE) SOLN Swish spit 5 ml four times daily as needed.  240 mL  0  . diclofenac (FLECTOR) 1.3 % PTCH Place 1 patch onto the skin as directed.  60 patch  0  . diphenhydrAMINE (BENADRYL) 25 MG tablet Take 25 mg by mouth as needed.        Marland Kitchen estrogens, conjugated, (PREMARIN) 0.625 MG tablet Take 1 tablet (0.625 mg total) by mouth daily.  30 tablet  5  . fexofenadine (ALLEGRA) 180 MG tablet Take 180 mg by mouth as needed.        . fluconazole (DIFLUCAN) 150 MG tablet Take one tablet by mouth as one dose  1 tablet  0  . fluticasone (FLONASE) 50 MCG/ACT nasal spray Place 2 sprays into the nose daily.  16 g  5  . furosemide (LASIX) 20 MG tablet Take 1 tablet (  20 mg total) by mouth daily as needed.  30 tablet  4  . glipiZIDE (GLUCOTROL) 5 MG tablet TAKE 1 TABLET BY MOUTH EVERY MORNING AND1/2 TABLET EVERY AFTERNOON  45 tablet  5  . glucose blood (ACCU-CHEK AVIVA PLUS) test strip Use to test blood sugar up to three times daily  100 each  12  . glucose blood (PRODIGY NO CODING BLOOD GLUC) test strip To test blood sugar 3 times a day  100 each  12  . hydrALAZINE (APRESOLINE) 10 MG tablet TAKE ONE TABLET BY MOUTH THREE TIMES DAILY FOR BLOOD PRESSURE  90 tablet  0  . hydrOXYzine (ATARAX/VISTARIL) 50 MG tablet Take 1 tablet (50 mg total) by mouth 3 (three) times daily as needed.  90 tablet  1  . levothyroxine (SYNTHROID, LEVOTHROID) 75 MCG tablet TAKE ONE (1) TABLET BY MOUTH EACH DAY  90 tablet  1  . lovastatin (MEVACOR) 40 MG tablet TAKE ONE TABLET BY MOUTH EVERY NIGHT  AT BEDTIME  30 tablet  5  . LYRICA 100 MG capsule TAKE 1 CAPSULE BY MOUTH THREE TIMES A DAY  90 capsule  0  . metoprolol (LOPRESSOR) 50 MG tablet Take 1 tablet (50 mg total) by mouth 2 (two) times daily. As needed for increased heart rate  60 tablet  3  . NEXIUM 40 MG capsule TAKE 1 TO 2 CAPSULES BY MOUTH ONCE DAILY  180 capsule  1  . nitroGLYCERIN (NITROSTAT) 0.4 MG SL tablet One tablet under tongue at onset of chest pain, you may repeat every 5 minutes for up to 3 doses  30 tablet  5  . nortriptyline (PAMELOR) 50 MG capsule TAKE ONE CAPSULE EACH NIGHT AT BEDTIME  30 capsule  3  . nystatin (MYCOSTATIN) 100000 UNIT/ML suspension Take 5 mLs (500,000 Units total) by mouth 4 (four) times daily.  60 mL  0  . nystatin cream (MYCOSTATIN) Apply 1 application topically 2 (two) times daily as needed.  30 g  3  . Olopatadine HCl (PATADAY) 0.2 % SOLN Apply one drop to each eye daily  3 Bottle  5  . Oxycodone HCl 10 MG TABS Take 1 tablet (10 mg total) by mouth 2 (two) times daily as needed.  60 each  0  . Oxycodone HCl 10 MG TABS Take 1 tablet (10 mg total) by mouth 2 (two) times daily as needed.  60 each  0  . PROAIR HFA 108 (90 BASE) MCG/ACT inhaler INHALE 2 PUFFS INTO THE LUNGS EVERY 4 HOURS AS NEEDED  8.5 g  1  . tiotropium (SPIRIVA HANDIHALER) 18 MCG inhalation capsule Place 1 capsule (18 mcg total) into inhaler and inhale daily.  30 capsule  5  . topiramate (TOPAMAX) 100 MG tablet Take 3 tablets by mouth daily.  90 tablet  6  . zolpidem (AMBIEN) 10 MG tablet TAKE ONE TABLET BY MOUTH EVERY NIGHT AT BEDTIME  30 tablet  0   No current facility-administered medications on file prior to visit.     The PMH, PSH, Social History, Family History, Medications, and allergies have been reviewed in [facility], and have been updated if relevant.  Review of Systems       See HPI   Physical Exam BP 118/82  Pulse 84  Temp(Src) 97.9 F (36.6 C)  Ht 5\' 6"  (1.676 m)  Wt 219 lb (99.338 kg)  BMI 35.36  kg/m2  General:  alert and overweight-appearing, no acute distress.   Head:  normocephalic, atraumatic, and no  abnormalities observed.   Mouth:  pharynx pink and moist.   Neck:  No deformities, masses, or tenderness noted. Lungs:  Normal respiratory effort, chest expands symmetrically. Lungs are clear to auscultation, no crackles or wheezes. Heart:  Normal rate and regular rhythm. S1 and S2 normal without gallop, murmur, click, rub or other extra sounds. Abdomen:  Bowel sounds positive,abdomen soft and non-tender without masses, organomegaly or hernias noted. Msk:  gross deformity of left arm/hand. Pulses:  R and L carotid,radial,femoral,dorsalis pedis and posterior tibial pulses are full and equal bilaterally Neurologic:  alert & oriented X3 and gait normal.   Skin:  Intact without suspicious lesions or rashes Psych:  Oriented X3 and good eye contact.    Assessment and Plan: 1. DIABETES MELLITUS, TYPE II Stable.  Recheck a1c today. - Hemoglobin A1c - Comprehensive metabolic panel  2. Thrush Improved.  3. Family conflict She talked to me about this at length- wants me to know that if I need to contact her daughter in the future to call Statistician.  I will ask laurie to add this to contact list.  She does not feel unsafe at home.  4. Screening for ischemic heart disease  - Lipid Panel

## 2012-09-09 NOTE — Patient Instructions (Addendum)
Good to see you. Please update me with your symptoms.

## 2012-09-10 ENCOUNTER — Encounter: Payer: Self-pay | Admitting: *Deleted

## 2012-09-19 ENCOUNTER — Telehealth: Payer: Self-pay | Admitting: Family Medicine

## 2012-09-19 NOTE — Telephone Encounter (Signed)
Pt left vm letting Dr. Dayton Martes know that she forgot to tell her during her last OV that she has been increasing the dose of her "sugar pill" to 2.5-3 tabs/day.

## 2012-09-24 ENCOUNTER — Other Ambulatory Visit: Payer: Self-pay | Admitting: *Deleted

## 2012-09-24 ENCOUNTER — Encounter: Payer: Self-pay | Admitting: Radiology

## 2012-09-24 MED ORDER — NYSTATIN 100000 UNIT/ML MT SUSP: 500000.0000 [IU] | Freq: Four times a day (QID) | OROMUCOSAL | Status: DC

## 2012-09-24 MED ORDER — AMOXICILLIN-POT CLAVULANATE 875-125 MG PO TABS: 1.0000 | ORAL_TABLET | Freq: Two times a day (BID) | ORAL | Status: DC

## 2012-09-24 MED ORDER — TOPIRAMATE 100 MG PO TABS: ORAL_TABLET | ORAL | Status: DC

## 2012-09-24 MED ORDER — ZOLPIDEM TARTRATE 10 MG PO TABS: ORAL_TABLET | ORAL | Status: DC

## 2012-09-24 MED ORDER — PREGABALIN 100 MG PO CAPS: ORAL_CAPSULE | ORAL | Status: DC

## 2012-09-24 MED ORDER — ALBUTEROL SULFATE HFA 108 (90 BASE) MCG/ACT IN AERS: INHALATION_SPRAY | RESPIRATORY_TRACT | Status: DC

## 2012-09-24 NOTE — Telephone Encounter (Signed)
Rx sent to Mariah Reed.  It may make more sense for her to come see me next week instead of tomorrow since we are starting antibiotics.

## 2012-09-24 NOTE — Telephone Encounter (Signed)
Pt states she has a bad sinus infection, her head is very painful, has yellow drainage that's now stopping up her head and not coming out. She is asking for augmentin 875 mg's to be called to [place].  She has appt to see you tomorrow but says she cant wait that long, please advise.

## 2012-09-24 NOTE — Telephone Encounter (Signed)
Lyrica and ambien called to [place].

## 2012-09-24 NOTE — Telephone Encounter (Signed)
Advised patient, appt cancelled.

## 2012-09-24 NOTE — Telephone Encounter (Signed)
All meds last refilled 08/27/12

## 2012-09-25 ENCOUNTER — Ambulatory Visit: Payer: Medicare Other | Admitting: Family Medicine

## 2012-10-17 ENCOUNTER — Other Ambulatory Visit: Payer: Self-pay | Admitting: *Deleted

## 2012-10-17 MED ORDER — NYSTATIN 100000 UNIT/GM EX CREA: 1.0000 "application " | TOPICAL_CREAM | Freq: Two times a day (BID) | CUTANEOUS | Status: DC | PRN

## 2012-10-21 ENCOUNTER — Other Ambulatory Visit: Payer: Self-pay | Admitting: *Deleted

## 2012-10-22 MED ORDER — ZOLPIDEM TARTRATE 10 MG PO TABS: ORAL_TABLET | ORAL | Status: DC

## 2012-10-22 MED ORDER — NORTRIPTYLINE HCL 50 MG PO CAPS: ORAL_CAPSULE | ORAL | Status: DC

## 2012-10-22 MED ORDER — PREGABALIN 100 MG PO CAPS: ORAL_CAPSULE | ORAL | Status: DC

## 2012-10-22 NOTE — Telephone Encounter (Signed)
meds called to [facility].

## 2012-10-23 ENCOUNTER — Telehealth: Payer: Self-pay | Admitting: *Deleted

## 2012-10-23 NOTE — Telephone Encounter (Signed)
Pt is asking for script for flagyl to be sent to [facility].  She states she has had rectal bleeding for 2 days, she states after taking strong antibiotics, and states that flagyl is the only thing that stops it.  Please advise.  States she has had this problem a few times in the past, always takes flagyl for it.

## 2012-10-23 NOTE — Telephone Encounter (Signed)
Left message asking patient to call back

## 2012-10-23 NOTE — Telephone Encounter (Signed)
I know she has a h/o diverticulitis but without other symptoms, I would prefer to continue to monitor her prior to giving abx.

## 2012-10-23 NOTE — Telephone Encounter (Signed)
Left detailed message on voicemail.  

## 2012-10-23 NOTE — Telephone Encounter (Signed)
Flagyl is one of the medications we use for rectal bleeding associated diverticulitis.  I would never prescribe just for rectal bleeding unless we though she had diverticulitis.  Is she having any other symptoms?

## 2012-10-23 NOTE — Telephone Encounter (Signed)
Spoke with patient.  She states she does have diverticulitis and that you have given her flagyl for it before.  She is not having any cramps or fever right now.

## 2012-10-24 NOTE — Telephone Encounter (Signed)
Pt left v/m  her symptoms are the same and if Dr Dayton Martes does not want to call in med pt said that was all right. Pt just wanted to let Dr Dayton Martes know. Unable to reach pt by phone.

## 2012-10-28 ENCOUNTER — Encounter: Payer: Self-pay | Admitting: Radiology

## 2012-10-29 ENCOUNTER — Encounter: Payer: Self-pay | Admitting: Family Medicine

## 2012-10-29 ENCOUNTER — Ambulatory Visit: Payer: Medicare Other | Admitting: Family Medicine

## 2012-10-29 ENCOUNTER — Ambulatory Visit (INDEPENDENT_AMBULATORY_CARE_PROVIDER_SITE_OTHER): Payer: Medicare Other | Admitting: Family Medicine

## 2012-10-29 ENCOUNTER — Ambulatory Visit (INDEPENDENT_AMBULATORY_CARE_PROVIDER_SITE_OTHER)
Admission: RE | Admit: 2012-10-29 | Discharge: 2012-10-29 | Disposition: A | Discharge status: Home / Self Care | Payer: Medicare Other | Source: Ambulatory Visit | Attending: Family Medicine | Admitting: Family Medicine

## 2012-10-29 VITALS — BP 114/66 | HR 116 | Temp 98.1°F | Wt 219.5 lb

## 2012-10-29 DIAGNOSIS — R51 Headache: Secondary | ICD-10-CM | POA: Diagnosis not present

## 2012-10-29 DIAGNOSIS — R0781 Pleurodynia: Secondary | ICD-10-CM

## 2012-10-29 DIAGNOSIS — R071 Chest pain on breathing: Secondary | ICD-10-CM

## 2012-10-29 DIAGNOSIS — K625 Hemorrhage of anus and rectum: Secondary | ICD-10-CM | POA: Diagnosis not present

## 2012-10-29 DIAGNOSIS — R519 Headache, unspecified: Secondary | ICD-10-CM | POA: Insufficient documentation

## 2012-10-29 DIAGNOSIS — Z23 Encounter for immunization: Secondary | ICD-10-CM

## 2012-10-29 DIAGNOSIS — G609 Hereditary and idiopathic neuropathy, unspecified: Secondary | ICD-10-CM

## 2012-10-29 DIAGNOSIS — J449 Chronic obstructive pulmonary disease, unspecified: Secondary | ICD-10-CM

## 2012-10-29 DIAGNOSIS — Z79899 Other long term (current) drug therapy: Secondary | ICD-10-CM | POA: Diagnosis not present

## 2012-10-29 MED ORDER — METRONIDAZOLE 500 MG PO TABS: 500.0000 mg | ORAL_TABLET | Freq: Three times a day (TID) | ORAL | Status: DC

## 2012-10-29 NOTE — Patient Instructions (Signed)
Good to see you. We will call you with your chest xray results.  Please let me know if the rectal bleeding continues.

## 2012-10-29 NOTE — Progress Notes (Signed)
67 yo pleasant female with multiple medical problems here for several "personal issues."   COPD- lately feels like her lungs are "boils."  When asked to describe this, it is not warmth but a pain.  Does not hurt with deep inspirations.  "I feel it from the top of my lungs to the bottoms, on both sides."  Complains of this for months. Has a h/o COPD but has refused to take her spiriva regularly. Has some SOB at baseline, this is not worse. Has h/o CP but this is not worse either.  Rectal bleeding- bright red blood.  Non painful and currently not bleeding.  H/o diverticulitis and colitis.  No h/o hemorrhoids. No abdominal pain or fever.  Headaches- chronic.  Has been referred to ENT but was not pleased with results.  Taking Allegra and flonase.  Does not want further work up or treatment but wants me to know this continues to be a problem. Denies any focal neurological symptoms.  Peripheral neuropathy- stopped her lyrica but her symptoms worsened.  Was not having side effects from lyrica, she just wanted to see if she really needed to take it.   Patient Active Problem List   Diagnosis Date Noted  . Headache(784.0) 10/29/2012  . Rectal bleeding 10/29/2012  . Family conflict 09/09/2012  . Thrush 06/09/2012  . Chronic headaches 04/03/2012  . Hypothyroidism 09/18/2010  . Vitamin D deficiency 06/14/2010  . ECHOCARDIOGRAM, ABNORMAL 03/16/2010  . SINUSITIS, CHRONIC 03/15/2010  . MYOCARDIAL INFARCTION, HX OF 02/09/2010  . LEFT BUNDLE BRANCH BLOCK 12/26/2009  . GERD 12/26/2009  . COPD 11/10/2009  . DIABETES MELLITUS, TYPE II 03/12/2006  . HYPERLIPIDEMIA 03/12/2006  . GOUT 03/12/2006  . PERIPHERAL NEUROPATHY 03/12/2006  . HYPERTENSION 03/12/2006  . ALLERGIC RHINITIS 03/12/2006   Past Medical History  Diagnosis Date  . Allergy   . Diabetes mellitus   . Gout   . Hyperlipidemia   . Hypertension   . COPD (chronic obstructive pulmonary disease)   . Peripheral neuropathy   . Low back  pain    Past Surgical History  Procedure Laterality Date  . Polypectomy      Colon  . Abdominal hysterectomy    . Tubal ligation     History  Substance Use Topics  . Smoking status: Never Smoker   . Smokeless tobacco: Not on file  . Alcohol Use: No   Family History  Problem Relation Age of Onset  . Uterine cancer Mother    Allergies  Allergen Reactions  . Levofloxacin Hives   Current Outpatient Prescriptions on File Prior to Visit  Medication Sig Dispense Refill  . ACCU-CHEK FASTCLIX LANCETS MISC 1 Device by Does not apply route 3 (three) times daily. Use to check blood sugar up to three times a day  102 each  12  . albuterol (PROAIR HFA) 108 (90 BASE) MCG/ACT inhaler INHALE 2 PUFFS INTO THE LUNGS EVERY 4 HOURS AS NEEDED  8.5 g  2  . Alum & Mag Hydroxide-Simeth (MAGIC MOUTHWASH W/LIDOCAINE) SOLN Swish spit 5 ml four times daily as needed.  240 mL  0  . diclofenac (FLECTOR) 1.3 % PTCH Place 1 patch onto the skin as directed.  60 patch  0  . diphenhydrAMINE (BENADRYL) 25 MG tablet Take 25 mg by mouth as needed.        Marland Kitchen estrogens, conjugated, (PREMARIN) 0.625 MG tablet Take 1 tablet (0.625 mg total) by mouth daily.  30 tablet  5  . fexofenadine (ALLEGRA) 180 MG tablet  Take 180 mg by mouth as needed.        . fluticasone (FLONASE) 50 MCG/ACT nasal spray Place 2 sprays into the nose daily.  16 g  5  . furosemide (LASIX) 20 MG tablet Take 1 tablet (20 mg total) by mouth daily as needed.  30 tablet  4  . glipiZIDE (GLUCOTROL) 5 MG tablet TAKE 1 TABLET BY MOUTH EVERY MORNING AND1/2 TABLET EVERY AFTERNOON  45 tablet  5  . glucose blood (ACCU-CHEK AVIVA PLUS) test strip Use to test blood sugar up to three times daily  100 each  12  . glucose blood (PRODIGY NO CODING BLOOD GLUC) test strip To test blood sugar 3 times a day  100 each  12  . hydrALAZINE (APRESOLINE) 10 MG tablet TAKE ONE TABLET BY MOUTH THREE TIMES DAILY FOR BLOOD PRESSURE  90 tablet  0  . hydrOXYzine (ATARAX/VISTARIL) 50  MG tablet Take 1 tablet (50 mg total) by mouth 3 (three) times daily as needed.  90 tablet  1  . levothyroxine (SYNTHROID, LEVOTHROID) 75 MCG tablet TAKE ONE (1) TABLET BY MOUTH EACH DAY  90 tablet  1  . lovastatin (MEVACOR) 40 MG tablet TAKE ONE TABLET BY MOUTH EVERY NIGHT AT BEDTIME  30 tablet  5  . metoprolol (LOPRESSOR) 50 MG tablet Take 1 tablet (50 mg total) by mouth 2 (two) times daily. As needed for increased heart rate  60 tablet  3  . NEXIUM 40 MG capsule TAKE 1 TO 2 CAPSULES BY MOUTH ONCE DAILY  180 capsule  1  . nitroGLYCERIN (NITROSTAT) 0.4 MG SL tablet One tablet under tongue at onset of chest pain, you may repeat every 5 minutes for up to 3 doses  30 tablet  5  . nortriptyline (PAMELOR) 50 MG capsule TAKE ONE CAPSULE EACH NIGHT AT BEDTIME  30 capsule  0  . nystatin (MYCOSTATIN) 100000 UNIT/ML suspension Take 5 mLs (500,000 Units total) by mouth 4 (four) times daily.  60 mL  0  . nystatin cream (MYCOSTATIN) Apply 1 application topically 2 (two) times daily as needed.  30 g  1  . Olopatadine HCl (PATADAY) 0.2 % SOLN Apply one drop to each eye daily  3 Bottle  5  . Oxycodone HCl 10 MG TABS Take 1 tablet (10 mg total) by mouth 2 (two) times daily as needed.  60 each  0  . pregabalin (LYRICA) 100 MG capsule TAKE 1 CAPSULE BY MOUTH THREE TIMES A DAY  90 capsule  0  . tiotropium (SPIRIVA HANDIHALER) 18 MCG inhalation capsule Place 1 capsule (18 mcg total) into inhaler and inhale daily.  30 capsule  5  . topiramate (TOPAMAX) 100 MG tablet Take 3 tablets by mouth daily.  90 tablet  6  . zolpidem (AMBIEN) 10 MG tablet TAKE ONE TABLET BY MOUTH EVERY NIGHT AT BEDTIME  30 tablet  0   No current facility-administered medications on file prior to visit.     The PMH, PSH, Social History, Family History, Medications, and allergies have been reviewed in [facility], and have been updated if relevant.  Review of Systems       See HPI   Physical Exam BP 114/66  Pulse 116  Temp(Src) 98.1 F (36.7 C)  (Oral)  Wt 219 lb 8 oz (99.565 kg)  BMI 35.45 kg/m2  SpO2 95%  General:  alert and overweight-appearing, no acute distress.   Head:  normocephalic, atraumatic, and no abnormalities observed.   Mouth:  pharynx  pink and moist.   Neck:  No deformities, masses, or tenderness noted. Lungs:  Normal respiratory effort, chest expands symmetrically. Lungs are clear to auscultation, no crackles or wheezes. Heart:  Normal rate and regular rhythm. S1 and S2 normal without gallop, murmur, click, rub or other extra sounds. Abdomen:  Bowel sounds positive,abdomen soft and non-tender without masses, organomegaly or hernias noted. Msk:  gross deformity of left arm/hand. Pulses:  R and L carotid,radial,femoral,dorsalis pedis and posterior tibial pulses are full and equal bilaterally Neurologic:  alert & oriented X3 and gait normal.   Skin:  Intact without suspicious lesions or rashes Psych:  Oriented X3 and good eye contact.   Rectal exam declined  Assessment and Plan: 1. Need for prophylactic vaccination and inoculation against influenza  - Flu Vaccine QUAD 36+ mos PF IM (Fluarix)  2. Headache(784.0) Persistent, sinus related. Continue current meds.  3. COPD Strongly urged her to restart Spriva.  Check CXR today.  Lungs clear on exam.  4. Pleuritic pain See above. - DG Chest 2 View; Future  5. Rectal bleeding Hemorrhoidal (internal) vs colitis or diverticulitis. She continues to ask for Flagyl -"it's the only thing that helps."  I advised further work up or augmentin since she cannot take cipro with flagyl but she refused. If there is a colitis component, flagyl may help so I agreed to prescribe short course.  If bleeding persists, I will not treat any further- she has to be seen by GI. The patient indicates understanding of these issues and agrees with the plan.     6. PERIPHERAL NEUROPATHY Improved when she restarted lyrica.

## 2012-11-05 ENCOUNTER — Other Ambulatory Visit: Payer: Self-pay | Admitting: *Deleted

## 2012-11-05 MED ORDER — DICLOFENAC EPOLAMINE 1.3 % TD PTCH: 1.0000 | MEDICATED_PATCH | TRANSDERMAL | Status: DC

## 2012-11-05 NOTE — Telephone Encounter (Signed)
Last filled 09/19/11

## 2012-11-10 ENCOUNTER — Other Ambulatory Visit: Payer: Self-pay | Admitting: *Deleted

## 2012-11-10 MED ORDER — FUROSEMIDE 20 MG PO TABS: 20.0000 mg | ORAL_TABLET | Freq: Every day | ORAL | Status: DC | PRN

## 2012-11-11 ENCOUNTER — Ambulatory Visit (INDEPENDENT_AMBULATORY_CARE_PROVIDER_SITE_OTHER): Payer: Medicare Other | Admitting: Family Medicine

## 2012-11-11 ENCOUNTER — Encounter: Payer: Self-pay | Admitting: Family Medicine

## 2012-11-11 ENCOUNTER — Ambulatory Visit (INDEPENDENT_AMBULATORY_CARE_PROVIDER_SITE_OTHER)
Admission: RE | Admit: 2012-11-11 | Discharge: 2012-11-11 | Disposition: A | Discharge status: Home / Self Care | Payer: Medicare Other | Source: Ambulatory Visit | Attending: Family Medicine | Admitting: Family Medicine

## 2012-11-11 VITALS — BP 144/92 | HR 97 | Temp 97.6°F | Wt 216.2 lb

## 2012-11-11 DIAGNOSIS — R109 Unspecified abdominal pain: Secondary | ICD-10-CM | POA: Diagnosis not present

## 2012-11-11 DIAGNOSIS — Z638 Other specified problems related to primary support group: Secondary | ICD-10-CM

## 2012-11-11 DIAGNOSIS — Z639 Problem related to primary support group, unspecified: Secondary | ICD-10-CM

## 2012-11-11 DIAGNOSIS — R1031 Right lower quadrant pain: Secondary | ICD-10-CM | POA: Insufficient documentation

## 2012-11-11 MED ORDER — OXYCODONE HCL 10 MG PO TABS: 10.0000 mg | ORAL_TABLET | Freq: Two times a day (BID) | ORAL | Status: DC | PRN

## 2012-11-11 NOTE — Patient Instructions (Addendum)
Good to see you. We will call you with your abdominal xray results.

## 2012-11-11 NOTE — Progress Notes (Signed)
67 yo pleasant female with multiple medical problems here for several "personal issues."  Abdominal pain- intermittent for years.  Rectal bleeding has resolved.  Does not want GI referral.  Takes antacids but does not seem to help.  No n/v/d.  Abdominal pain typically epigastric but last night was "all over." Rectal bleeding- bright red blood.  Non painful and currently not bleeding.  H/o diverticulitis and colitis.  No h/o hemorrhoids. No fever.  Family conflict- she is asking me to call her daughter, Mariah Reed, in [place] to tell her that she "needs her to come and get her because her heart and spine are weak."  She feels safe at home but she wants to live with her daughter in [place].  She feels her daughter will not believe her if she calls because she is "married to a black man."   Patient Active Problem List   Diagnosis Date Noted  . Abdominal pain, unspecified site 11/11/2012  . Headache(784.0) 10/29/2012  . Rectal bleeding 10/29/2012  . Family conflict 09/09/2012  . Thrush 06/09/2012  . Chronic headaches 04/03/2012  . Hypothyroidism 09/18/2010  . Vitamin D deficiency 06/14/2010  . ECHOCARDIOGRAM, ABNORMAL 03/16/2010  . SINUSITIS, CHRONIC 03/15/2010  . MYOCARDIAL INFARCTION, HX OF 02/09/2010  . LEFT BUNDLE BRANCH BLOCK 12/26/2009  . GERD 12/26/2009  . COPD 11/10/2009  . DIABETES MELLITUS, TYPE II 03/12/2006  . HYPERLIPIDEMIA 03/12/2006  . GOUT 03/12/2006  . PERIPHERAL NEUROPATHY 03/12/2006  . HYPERTENSION 03/12/2006  . ALLERGIC RHINITIS 03/12/2006   Past Medical History  Diagnosis Date  . Allergy   . Diabetes mellitus   . Gout   . Hyperlipidemia   . Hypertension   . COPD (chronic obstructive pulmonary disease)   . Peripheral neuropathy   . Low back pain    Past Surgical History  Procedure Laterality Date  . Polypectomy      Colon  . Abdominal hysterectomy    . Tubal ligation     History  Substance Use Topics  . Smoking status: Never Smoker   . Smokeless  tobacco: Not on file  . Alcohol Use: No   Family History  Problem Relation Age of Onset  . Uterine cancer Mother    Allergies  Allergen Reactions  . Levofloxacin Hives   Current Outpatient Prescriptions on File Prior to Visit  Medication Sig Dispense Refill  . ACCU-CHEK FASTCLIX LANCETS MISC 1 Device by Does not apply route 3 (three) times daily. Use to check blood sugar up to three times a day  102 each  12  . albuterol (PROAIR HFA) 108 (90 BASE) MCG/ACT inhaler INHALE 2 PUFFS INTO THE LUNGS EVERY 4 HOURS AS NEEDED  8.5 g  2  . Alum & Mag Hydroxide-Simeth (MAGIC MOUTHWASH W/LIDOCAINE) SOLN Swish spit 5 ml four times daily as needed.  240 mL  0  . diclofenac (FLECTOR) 1.3 % PTCH Place 1 patch onto the skin as directed.  60 patch  0  . diphenhydrAMINE (BENADRYL) 25 MG tablet Take 25 mg by mouth as needed.        Marland Kitchen estrogens, conjugated, (PREMARIN) 0.625 MG tablet Take 1 tablet (0.625 mg total) by mouth daily.  30 tablet  5  . fexofenadine (ALLEGRA) 180 MG tablet Take 180 mg by mouth as needed.        . fluticasone (FLONASE) 50 MCG/ACT nasal spray Place 2 sprays into the nose daily.  16 g  5  . furosemide (LASIX) 20 MG tablet Take 1 tablet (20 mg  total) by mouth daily as needed.  30 tablet  4  . glipiZIDE (GLUCOTROL) 5 MG tablet TAKE 1 TABLET BY MOUTH EVERY MORNING AND1/2 TABLET EVERY AFTERNOON  45 tablet  5  . glucose blood (ACCU-CHEK AVIVA PLUS) test strip Use to test blood sugar up to three times daily  100 each  12  . glucose blood (PRODIGY NO CODING BLOOD GLUC) test strip To test blood sugar 3 times a day  100 each  12  . hydrALAZINE (APRESOLINE) 10 MG tablet TAKE ONE TABLET BY MOUTH THREE TIMES DAILY FOR BLOOD PRESSURE  90 tablet  0  . hydrOXYzine (ATARAX/VISTARIL) 50 MG tablet Take 1 tablet (50 mg total) by mouth 3 (three) times daily as needed.  90 tablet  1  . levothyroxine (SYNTHROID, LEVOTHROID) 75 MCG tablet TAKE ONE (1) TABLET BY MOUTH EACH DAY  90 tablet  1  . lovastatin  (MEVACOR) 40 MG tablet TAKE ONE TABLET BY MOUTH EVERY NIGHT AT BEDTIME  30 tablet  5  . metoprolol (LOPRESSOR) 50 MG tablet Take 1 tablet (50 mg total) by mouth 2 (two) times daily. As needed for increased heart rate  60 tablet  3  . metroNIDAZOLE (FLAGYL) 500 MG tablet Take 1 tablet (500 mg total) by mouth 3 (three) times daily.  21 tablet  0  . NEXIUM 40 MG capsule TAKE 1 TO 2 CAPSULES BY MOUTH ONCE DAILY  180 capsule  1  . nitroGLYCERIN (NITROSTAT) 0.4 MG SL tablet One tablet under tongue at onset of chest pain, you may repeat every 5 minutes for up to 3 doses  30 tablet  5  . nortriptyline (PAMELOR) 50 MG capsule TAKE ONE CAPSULE EACH NIGHT AT BEDTIME  30 capsule  0  . nystatin (MYCOSTATIN) 100000 UNIT/ML suspension Take 5 mLs (500,000 Units total) by mouth 4 (four) times daily.  60 mL  0  . nystatin cream (MYCOSTATIN) Apply 1 application topically 2 (two) times daily as needed.  30 g  1  . Olopatadine HCl (PATADAY) 0.2 % SOLN Apply one drop to each eye daily  3 Bottle  5  . pregabalin (LYRICA) 100 MG capsule TAKE 1 CAPSULE BY MOUTH THREE TIMES A DAY  90 capsule  0  . tiotropium (SPIRIVA HANDIHALER) 18 MCG inhalation capsule Place 1 capsule (18 mcg total) into inhaler and inhale daily.  30 capsule  5  . topiramate (TOPAMAX) 100 MG tablet Take 3 tablets by mouth daily.  90 tablet  6  . zolpidem (AMBIEN) 10 MG tablet TAKE ONE TABLET BY MOUTH EVERY NIGHT AT BEDTIME  30 tablet  0   No current facility-administered medications on file prior to visit.     The PMH, PSH, Social History, Family History, Medications, and allergies have been reviewed in [facility], and have been updated if relevant.  Review of Systems       See HPI   Physical Exam BP 144/92  Pulse 97  Temp(Src) 97.6 F (36.4 C) (Oral)  Wt 216 lb 4 oz (98.09 kg)  BMI 34.92 kg/m2  SpO2 97%  General:  alert and overweight-appearing, no acute distress.   Head:  normocephalic, atraumatic, and no abnormalities observed.   Mouth:   pharynx pink and moist.   Neck:  No deformities, masses, or tenderness noted. Lungs:  Normal respiratory effort, chest expands symmetrically. Lungs are clear to auscultation, no crackles or wheezes. Heart:  Normal rate and regular rhythm. S1 and S2 normal without gallop, murmur, click, rub or  other extra sounds. Abdomen:  Bowel sounds positive Mild diffuse abdominal tenderness, no rebound or guarding. Msk:  gross deformity of left arm/hand. Neurologic:  alert & oriented X3 and gait normal.   Skin:  Intact without suspicious lesions or rashes Psych:  Oriented X3 and good eye contact.     Assessment and Plan: 1. Abdominal pain, unspecified site Does not have an acute abdomen on exam and this is likely gas pain/reflux.  She refuses endoscopy.  She is asking for KUB.  I advised that this may not show much but at least it would look at her bowel/gas patterns which could be helpful. Will order today. - DG Abd 1 View; Future  2. Family conflict >35 min spent with face to face with patient, >50% counseling and/or coordinating care. Explained to [name] that I cannot get involved in family conflict.  I did call her daughter and left a message with her husband asking her to call me if she had any questions concerning her mother's care since Ms. Canales as authorized me to do so.

## 2012-11-13 ENCOUNTER — Telehealth: Payer: Self-pay

## 2012-11-13 NOTE — Telephone Encounter (Signed)
Thank you for the update!

## 2012-11-13 NOTE — Telephone Encounter (Signed)
Pt called for abd xray results.Patient notified as instructed that abd xray showed no acute findings; pt said she is not having any pain right now and is OK today.

## 2012-11-14 ENCOUNTER — Telehealth: Payer: Self-pay | Admitting: *Deleted

## 2012-11-14 NOTE — Telephone Encounter (Signed)
Noted  

## 2012-11-14 NOTE — Telephone Encounter (Signed)
Pt calls re: a family issue she discussed with you at her visit a few days ago. She states she would just like to have that "put on the back burner for right now".

## 2012-11-21 ENCOUNTER — Other Ambulatory Visit: Payer: Self-pay | Admitting: *Deleted

## 2012-11-21 MED ORDER — PREGABALIN 100 MG PO CAPS: ORAL_CAPSULE | ORAL | Status: DC

## 2012-11-21 MED ORDER — NORTRIPTYLINE HCL 50 MG PO CAPS: ORAL_CAPSULE | ORAL | Status: DC

## 2012-11-21 MED ORDER — ZOLPIDEM TARTRATE 10 MG PO TABS: ORAL_TABLET | ORAL | Status: DC

## 2012-11-21 NOTE — Telephone Encounter (Signed)
Appropriate RX's Phoned in to pharmacy.

## 2012-11-21 NOTE — Telephone Encounter (Signed)
All 3 meds last filled 10/22/12

## 2012-11-27 ENCOUNTER — Telehealth: Payer: Self-pay | Admitting: *Deleted

## 2012-11-27 NOTE — Telephone Encounter (Signed)
Received prior auth request from pharmacy. I obtained and completed prior auth paperwork using the information provided in patients medical record. Med was approved until 02/04/2014. I faxed notice of approval to pharmacy and will send paperwork to be scanned into pt's chart.

## 2012-12-10 ENCOUNTER — Ambulatory Visit: Payer: Medicare Other | Admitting: Family Medicine

## 2012-12-15 ENCOUNTER — Other Ambulatory Visit: Payer: Self-pay | Admitting: *Deleted

## 2012-12-15 MED ORDER — METOPROLOL TARTRATE 50 MG PO TABS: 50.0000 mg | ORAL_TABLET | Freq: Two times a day (BID) | ORAL | Status: DC

## 2012-12-17 ENCOUNTER — Other Ambulatory Visit: Payer: Self-pay | Admitting: *Deleted

## 2012-12-17 MED ORDER — ALBUTEROL SULFATE HFA 108 (90 BASE) MCG/ACT IN AERS: INHALATION_SPRAY | RESPIRATORY_TRACT | Status: DC

## 2012-12-17 MED ORDER — LEVOTHYROXINE SODIUM 75 MCG PO TABS: ORAL_TABLET | ORAL | Status: DC

## 2012-12-17 MED ORDER — ESTROGENS CONJUGATED 0.625 MG PO TABS: 0.6250 mg | ORAL_TABLET | Freq: Every day | ORAL | Status: DC

## 2012-12-17 MED ORDER — GLIPIZIDE 5 MG PO TABS: ORAL_TABLET | ORAL | Status: DC

## 2012-12-17 MED ORDER — LOVASTATIN 40 MG PO TABS: ORAL_TABLET | ORAL | Status: DC

## 2012-12-17 MED ORDER — OLOPATADINE HCL 0.2 % OP SOLN: OPHTHALMIC | Status: DC

## 2012-12-17 MED ORDER — FLUTICASONE PROPIONATE 50 MCG/ACT NA SUSP: 2.0000 | Freq: Every day | NASAL | Status: DC

## 2012-12-17 MED ORDER — ESOMEPRAZOLE MAGNESIUM 40 MG PO CPDR: DELAYED_RELEASE_CAPSULE | ORAL | Status: DC

## 2012-12-17 MED ORDER — NYSTATIN 100000 UNIT/ML MT SUSP: 500000.0000 [IU] | Freq: Four times a day (QID) | OROMUCOSAL | Status: DC

## 2012-12-17 NOTE — Telephone Encounter (Signed)
All 3 meds last filled. 11/21/12

## 2012-12-18 MED ORDER — ZOLPIDEM TARTRATE 10 MG PO TABS: ORAL_TABLET | ORAL | Status: DC

## 2012-12-18 MED ORDER — PREGABALIN 100 MG PO CAPS: ORAL_CAPSULE | ORAL | Status: DC

## 2012-12-18 MED ORDER — NORTRIPTYLINE HCL 50 MG PO CAPS: ORAL_CAPSULE | ORAL | Status: DC

## 2012-12-18 NOTE — Telephone Encounter (Signed)
Ok to have filled on 12/22/12

## 2012-12-19 ENCOUNTER — Other Ambulatory Visit: Payer: Self-pay | Admitting: Family Medicine

## 2012-12-19 ENCOUNTER — Other Ambulatory Visit: Payer: Self-pay | Admitting: *Deleted

## 2012-12-19 MED ORDER — TIOTROPIUM BROMIDE MONOHYDRATE 18 MCG IN CAPS: 18.0000 ug | ORAL_CAPSULE | Freq: Every day | RESPIRATORY_TRACT | Status: DC

## 2012-12-19 NOTE — Telephone Encounter (Signed)
Called controlled RX's into [facility] and informed them that they are not to be filled until 12/22/12.

## 2012-12-19 NOTE — Telephone Encounter (Signed)
Pt called requesting status of refills.  Called pt back and informed her medication cannot be filled until 11/17 and to please call back then for refill request.

## 2012-12-19 NOTE — Telephone Encounter (Signed)
Faxed refill request. Please advise.  

## 2012-12-21 MED ORDER — ZOLPIDEM TARTRATE 10 MG PO TABS: ORAL_TABLET | ORAL | Status: DC

## 2012-12-21 MED ORDER — OLOPATADINE HCL 0.2 % OP SOLN: OPHTHALMIC | Status: DC

## 2012-12-21 NOTE — Telephone Encounter (Signed)
Please call in both

## 2012-12-22 ENCOUNTER — Other Ambulatory Visit: Payer: Self-pay | Admitting: *Deleted

## 2012-12-22 ENCOUNTER — Ambulatory Visit: Payer: Medicare Other | Admitting: Internal Medicine

## 2012-12-22 NOTE — Telephone Encounter (Signed)
Medication phoned to pharmacy.  

## 2012-12-23 ENCOUNTER — Ambulatory Visit (INDEPENDENT_AMBULATORY_CARE_PROVIDER_SITE_OTHER): Payer: Medicare Other | Admitting: Family Medicine

## 2012-12-23 ENCOUNTER — Encounter: Payer: Self-pay | Admitting: Family Medicine

## 2012-12-23 ENCOUNTER — Ambulatory Visit: Payer: Medicare Other | Admitting: Internal Medicine

## 2012-12-23 VITALS — BP 148/92 | HR 103 | Temp 98.3°F | Ht 66.0 in | Wt 217.2 lb

## 2012-12-23 DIAGNOSIS — R1031 Right lower quadrant pain: Secondary | ICD-10-CM | POA: Diagnosis not present

## 2012-12-23 DIAGNOSIS — K625 Hemorrhage of anus and rectum: Secondary | ICD-10-CM

## 2012-12-23 NOTE — Progress Notes (Signed)
Pre-visit discussion using our clinic review tool. No additional management support is needed unless otherwise documented below in the visit note.  

## 2012-12-23 NOTE — Progress Notes (Signed)
Subjective:    Patient ID: Mariah Reed, female    DOB: October 19, 1945, 67 y.o.   MRN: [id number]  HPI  67 year old female pt of [name] with history of chrinic abdominal apin, seen in 10/2012 and 11/2012 by PCP for rectal bleeding and abdominal pain presents with right lower abdominal pain. She began with  Bright red blood bloody stool.Marland Kitchendrops on toilet tissue and drops in commode  She states that the pain started after eating peanuts. Took some old metronidazole 3 tabs that she was given in the past and her pain has  and blood has resolved entirely.   She was seen in 10/2012 by PCP:  At that appt per Dr. Dayton Martes: Rectal bleeding  Hemorrhoidal (internal) vs colitis or diverticulitis.  She continues to ask for Flagyl -"it's the only thing that helps." I advised further work up or augmentin since she cannot take cipro with flagyl but she refused.  If there is a colitis component, flagyl may help so I agreed to prescribe short course. If bleeding persists, I will not treat any further- she has to be seen by GI.  The patient indicates understanding of these issues and agrees with the plan.    11/2012 appt with Dr. Dayton Martes: Abdominal pain- intermittent for years. Rectal bleeding has resolved. Does not want GI referral. Takes antacids but does not seem to help. No n/v/d. Abdominal pain typically epigastric but last night was "all over."  Rectal bleeding- bright red blood. Non painful and currently not bleeding. H/o diverticulitis and colitis. No h/o hemorrhoids.  No fever.  KUB at that OV showed no acute findings. Pain thought by PCP to be gas, functional pain.  Pt refused referral to GI.    TODAY: she is requesting further antibiotics... Flagyl course alone. The epigastic pain has resolved. No further bleeding or abdominal apin  She is hesitant about GI referral..had bad experience of colonoscopy in past (screaming and choking, colon spasm)  Per pt was nml 1980s or 90s.      Review of Systems   Constitutional: Negative for fever and fatigue.  HENT: Negative for ear pain.   Eyes: Negative for pain.  Respiratory: Negative for chest tightness and shortness of breath.   Cardiovascular: Negative for chest pain, palpitations and leg swelling.  Gastrointestinal: Positive for abdominal pain.  Genitourinary: Negative for dysuria.       Objective:   Physical Exam  Constitutional: Vital signs are normal. She appears well-developed and well-nourished. She is cooperative.  Non-toxic appearance. She does not appear ill. No distress.  Overweight nontoxic appearing  HENT:  Head: Normocephalic.  Right Ear: Hearing, tympanic membrane, external ear and ear canal normal. Tympanic membrane is not erythematous, not retracted and not bulging.  Left Ear: Hearing, tympanic membrane, external ear and ear canal normal. Tympanic membrane is not erythematous, not retracted and not bulging.  Nose: No mucosal edema or rhinorrhea. Right sinus exhibits no maxillary sinus tenderness and no frontal sinus tenderness. Left sinus exhibits no maxillary sinus tenderness and no frontal sinus tenderness.  Mouth/Throat: Uvula is midline, oropharynx is clear and moist and mucous membranes are normal.  Eyes: Conjunctivae, EOM and lids are normal. Pupils are equal, round, and reactive to light. Lids are everted and swept, no foreign bodies found.  Neck: Trachea normal and normal range of motion. Neck supple. Carotid bruit is not present. No mass and no thyromegaly present.  Cardiovascular: Normal rate, regular rhythm, S1 normal, S2 normal, normal heart sounds, intact distal pulses  and normal pulses.  Exam reveals no gallop and no friction rub.   No murmur heard. Pulmonary/Chest: Effort normal and breath sounds normal. Not tachypneic. No respiratory distress. She has no decreased breath sounds. She has no wheezes. She has no rhonchi. She has no rales.  Abdominal: Soft. Normal appearance and bowel sounds are normal. There is  no tenderness.  Neurological: She is alert.  Skin: Skin is warm, dry and intact. No rash noted.  Psychiatric: Her speech is normal and behavior is normal. Judgment and thought content normal. Her mood appears not anxious. Cognition and memory are normal. She does not exhibit a depressed mood.          Assessment & Plan:  Total visit time 30 minutes, > 50% spent counseling and cordinating patients care.

## 2012-12-23 NOTE — Patient Instructions (Signed)
I recommend a rectal exam to assess for causes of bleeding. I recommend labs and CT scan to evaluate abdominal pain furhter.  I do not feel it is appropriate to prescribe flagyl without further evaluation.  You have also been offered GI referral. These recommendations have been refused by you.  If you have further bleeding or pain go to the ER.

## 2012-12-23 NOTE — Assessment & Plan Note (Signed)
Pt was having pain in RLQ along with significant bleeding rectally.  No further issues.  She states flagyl has helped her and she needs more.  I am uncomfortable prescribing flagyl ie antibiotics again without further eval. (also see PCP note)  No records of past colonoscopy in chart.  Recommend rectal exam.. Pt refuses. Recommend CT abd pelvis to eval for colitis or diverticular disease. ( pt refuses) Recommend consideration of GI referral if other recommendations refused but pt refuses this as well.  She states she understands my recommendations and risk associated  And refuses.  She has been instructed to go to ER if rectal bleeding returns and is copius or if pain returns.  Will CC PCP.

## 2012-12-25 ENCOUNTER — Other Ambulatory Visit: Payer: Self-pay | Admitting: *Deleted

## 2012-12-25 MED ORDER — HYDRALAZINE HCL 10 MG PO TABS: ORAL_TABLET | ORAL | Status: DC

## 2013-01-06 ENCOUNTER — Telehealth: Payer: Self-pay | Admitting: *Deleted

## 2013-01-06 NOTE — Telephone Encounter (Signed)
Faxed to [facility] [phone].

## 2013-01-06 NOTE — Telephone Encounter (Signed)
Placed in Dr. Daphine Deutscher inbox since Dr. Dayton Martes and Nicki Reaper, NP are both out of office.

## 2013-01-06 NOTE — Telephone Encounter (Signed)
Received fax from [employer] for testing supplies.  Called and verified with Mariah Reed that she did request diabetic testing supplies from this company.  Placed in [name] in box for completion.

## 2013-01-06 NOTE — Telephone Encounter (Signed)
In outbox

## 2013-01-19 ENCOUNTER — Other Ambulatory Visit: Payer: Self-pay | Admitting: Internal Medicine

## 2013-01-19 NOTE — Telephone Encounter (Signed)
rx printed and signed, given to waynetta, cma

## 2013-01-19 NOTE — Telephone Encounter (Signed)
Spoke to pt and Rx called into [facility] as requested

## 2013-01-20 ENCOUNTER — Other Ambulatory Visit: Payer: Self-pay

## 2013-01-20 MED ORDER — NORTRIPTYLINE HCL 50 MG PO CAPS: ORAL_CAPSULE | ORAL | Status: DC

## 2013-01-20 NOTE — Telephone Encounter (Signed)
Rx last filled 12/18/12--please advise

## 2013-01-20 NOTE — Telephone Encounter (Signed)
Rx sent to pharmacy   

## 2013-01-20 NOTE — Addendum Note (Signed)
Addended by: Roena Malady on: 01/20/2013 11:23 AM   Modules accepted: Orders

## 2013-01-22 ENCOUNTER — Telehealth: Payer: Self-pay | Admitting: Internal Medicine

## 2013-01-22 NOTE — Telephone Encounter (Signed)
Received correspondence in reference to pt's Hydralazine. They state that pt had medication decreased recently from 25mg  down to 10mg , but now pt states that her BP has been elevated and thinks that she may need to go back up to 25mg . Pharmacy wants to know if this is ok--please advise

## 2013-01-22 NOTE — Telephone Encounter (Signed)
Pharmacy notified, and aware of pt needing OV before any changes can be made

## 2013-01-22 NOTE — Telephone Encounter (Signed)
She would need a OV for [place] to assess this

## 2013-01-27 ENCOUNTER — Other Ambulatory Visit: Payer: Self-pay | Admitting: Family Medicine

## 2013-02-18 ENCOUNTER — Other Ambulatory Visit: Payer: Self-pay | Admitting: Family Medicine

## 2013-02-19 ENCOUNTER — Telehealth: Payer: Self-pay | Admitting: *Deleted

## 2013-02-19 NOTE — Telephone Encounter (Signed)
Received prior auth request for Ambien. I obtained forms, and called pt for more info re: prior auth questions. Per pt she has had insomnia all her life, and has been on Ambien a long time, but couldn't give a specific timeframe. She also says pain is another cause of her insomnia. She has tried and failed Lunesta in the past and had to switch back to Ambien. Form's placed in your inbox.

## 2013-02-19 NOTE — Telephone Encounter (Signed)
Signed and in my box. 

## 2013-02-23 ENCOUNTER — Other Ambulatory Visit: Payer: Self-pay | Admitting: Internal Medicine

## 2013-02-24 NOTE — Telephone Encounter (Signed)
Spoke to pt and informed her Rx has been faxed to requested pharmacy 

## 2013-02-25 ENCOUNTER — Telehealth: Payer: Self-pay

## 2013-02-25 NOTE — Telephone Encounter (Signed)
Pt left v/m; pt got notification of prior auth needed for Flector patches from [employer]; pt needs to try Voltaren instead of Flector patches. Pt has previously tried voltaren and med was not effective.

## 2013-03-02 ENCOUNTER — Telehealth: Payer: Self-pay

## 2013-03-02 NOTE — Telephone Encounter (Signed)
Completed and in my box. 

## 2013-03-02 NOTE — Telephone Encounter (Signed)
Received notification from [employer] that they no longer handle pt's prescription coverage. New forms requested from new insurance and placed in your inbox along with the old pa forms for reference.

## 2013-03-02 NOTE — Telephone Encounter (Signed)
Prior auth paperwork obtained and placed in your inbox.

## 2013-03-02 NOTE — Telephone Encounter (Signed)
Received faxed form from [employer] for TENS unit; faxed form back to [facility] with note pt needs to request; [facility] to have pt contact office for appt.

## 2013-03-09 NOTE — Telephone Encounter (Signed)
Still pending response from insurance company, I will resubmit forms.

## 2013-03-11 ENCOUNTER — Ambulatory Visit (INDEPENDENT_AMBULATORY_CARE_PROVIDER_SITE_OTHER): Payer: Medicare Other | Admitting: Family Medicine

## 2013-03-11 ENCOUNTER — Encounter: Payer: Self-pay | Admitting: Family Medicine

## 2013-03-11 ENCOUNTER — Ambulatory Visit (INDEPENDENT_AMBULATORY_CARE_PROVIDER_SITE_OTHER)
Admission: RE | Admit: 2013-03-11 | Discharge: 2013-03-11 | Disposition: A | Discharge status: Home / Self Care | Payer: Medicare Other | Source: Ambulatory Visit | Attending: Family Medicine | Admitting: Family Medicine

## 2013-03-11 ENCOUNTER — Telehealth: Payer: Self-pay | Admitting: *Deleted

## 2013-03-11 VITALS — BP 152/86 | HR 77 | Temp 97.7°F | Ht 64.0 in | Wt 222.5 lb

## 2013-03-11 DIAGNOSIS — E119 Type 2 diabetes mellitus without complications: Secondary | ICD-10-CM | POA: Diagnosis not present

## 2013-03-11 DIAGNOSIS — R1031 Right lower quadrant pain: Secondary | ICD-10-CM | POA: Diagnosis not present

## 2013-03-11 DIAGNOSIS — D1779 Benign lipomatous neoplasm of other sites: Secondary | ICD-10-CM

## 2013-03-11 DIAGNOSIS — M542 Cervicalgia: Secondary | ICD-10-CM

## 2013-03-11 DIAGNOSIS — K625 Hemorrhage of anus and rectum: Secondary | ICD-10-CM | POA: Diagnosis not present

## 2013-03-11 DIAGNOSIS — E039 Hypothyroidism, unspecified: Secondary | ICD-10-CM | POA: Diagnosis not present

## 2013-03-11 DIAGNOSIS — M5030 Other cervical disc degeneration, unspecified cervical region: Secondary | ICD-10-CM | POA: Diagnosis not present

## 2013-03-11 DIAGNOSIS — D171 Benign lipomatous neoplasm of skin and subcutaneous tissue of trunk: Secondary | ICD-10-CM | POA: Insufficient documentation

## 2013-03-11 DIAGNOSIS — M47812 Spondylosis without myelopathy or radiculopathy, cervical region: Secondary | ICD-10-CM | POA: Diagnosis not present

## 2013-03-11 LAB — HM DIABETES FOOT EXAM

## 2013-03-11 LAB — HEMOGLOBIN A1C: Hgb A1c MFr Bld: 7 % — ABNORMAL HIGH (ref 4.6–6.5)

## 2013-03-11 LAB — HM COLONOSCOPY

## 2013-03-11 MED ORDER — HYDROXYZINE HCL 10 MG PO TABS: 10.0000 mg | ORAL_TABLET | Freq: Three times a day (TID) | ORAL | Status: DC | PRN

## 2013-03-11 MED ORDER — OXYCODONE HCL 10 MG PO TABS: 10.0000 mg | ORAL_TABLET | Freq: Two times a day (BID) | ORAL | Status: DC | PRN

## 2013-03-11 NOTE — Telephone Encounter (Signed)
I have completed several prior auth requests for Flector Patch, Nexium and Premarin, they are on your desk to be filled out, please pay special attention to the highlighted areas I didn't know what to put there. Also I changed pt's Hydroxyzine from 50 mg to 10 mg and resent to the pharmacy and pt's Ambien can be filled after 03/19/13.  Please let me know when the forms are ready and I will come get them.

## 2013-03-11 NOTE — Assessment & Plan Note (Signed)
Due for a1c. Ordered today.

## 2013-03-11 NOTE — Progress Notes (Signed)
Pre-visit discussion using our clinic review tool. No additional management support is needed unless otherwise documented below in the visit note.  

## 2013-03-11 NOTE — Progress Notes (Signed)
68 yo with multiple medical problems here for follow up.   Hypothyroidism- does complain of more fatigue.  Denies any other symptoms of hypo or hyperthyroidism. Lab Results  Component Value Date   TSH 2.55 06/09/2012    DM- CBGs stable.  On Glucotrol 5 mg daily.  CBGs no higher than 223.  Checks CBG 2-3 times daily.  Checks CBGs once daily.  Feels she is getting thrush recurrently. Lab Results  Component Value Date   HGBA1C 6.7* 09/09/2012   Chronic pain- managing ok, Oxycodone and nortriptyline have helped.  Taking care of her husband who is also disabled.  Persistent rectal bleeding- no episode since 12/2012- she consistently asks for Flagyl when this happens and I have explained to her that she needs a further GI workup- ie colonoscopy but she has refused this. Does have abdominal pain when this occurs and she feels this is diverticulitis.  Neck pain- chronic right sided neck pain, now worsening with right hand pain. Has always had right had weakness- does not feel this is worse.  Patient Active Problem List   Diagnosis Date Noted  . Abdominal pain, right lower quadrant 11/11/2012  . Headache(784.0) 10/29/2012  . Rectal bleeding 10/29/2012  . Family conflict 16/11/9602  . Thrush 06/09/2012  . Chronic headaches 04/03/2012  . Hypothyroidism 09/18/2010  . Vitamin D deficiency 06/14/2010  . ECHOCARDIOGRAM, ABNORMAL 03/16/2010  . SINUSITIS, CHRONIC 03/15/2010  . MYOCARDIAL INFARCTION, HX OF 02/09/2010  . LEFT BUNDLE BRANCH BLOCK 12/26/2009  . GERD 12/26/2009  . COPD 11/10/2009  . DIABETES MELLITUS, TYPE II 03/12/2006  . HYPERLIPIDEMIA 03/12/2006  . GOUT 03/12/2006  . PERIPHERAL NEUROPATHY 03/12/2006  . HYPERTENSION 03/12/2006  . ALLERGIC RHINITIS 03/12/2006   Past Medical History  Diagnosis Date  . Allergy   . Diabetes mellitus   . Gout   . Hyperlipidemia   . Hypertension   . COPD (chronic obstructive pulmonary disease)   . Peripheral neuropathy   . Low back pain     Past Surgical History  Procedure Laterality Date  . Polypectomy      Colon  . Abdominal hysterectomy    . Tubal ligation     History  Substance Use Topics  . Smoking status: Never Smoker   . Smokeless tobacco: Never Used  . Alcohol Use: No   Family History  Problem Relation Age of Onset  . Uterine cancer Mother    Allergies  Allergen Reactions  . Levofloxacin Hives   Current Outpatient Prescriptions on File Prior to Visit  Medication Sig Dispense Refill  . ACCU-CHEK FASTCLIX LANCETS MISC 1 Device by Does not apply route 3 (three) times daily. Use to check blood sugar up to three times a day  102 each  12  . albuterol (PROAIR HFA) 108 (90 BASE) MCG/ACT inhaler INHALE 2 PUFFS INTO THE LUNGS EVERY 4 HOURS AS NEEDED  8.5 g  2  . Alum & Mag Hydroxide-Simeth (MAGIC MOUTHWASH W/LIDOCAINE) SOLN Swish spit 5 ml four times daily as needed.  240 mL  0  . diphenhydrAMINE (BENADRYL) 25 MG tablet Take 25 mg by mouth as needed.        Marland Kitchen esomeprazole (NEXIUM) 40 MG capsule TAKE 1 TO 2 CAPSULES BY MOUTH ONCE DAILY  180 capsule  1  . estrogens, conjugated, (PREMARIN) 0.625 MG tablet Take 1 tablet (0.625 mg total) by mouth daily.  30 tablet  2  . fexofenadine (ALLEGRA) 180 MG tablet Take 180 mg by mouth as needed.        Marland Kitchen  FLECTOR 1.3 % PTCH PLACE 1 PATCH ONTO THE SKIN AS DIRECTED  60 patch  0  . fluticasone (FLONASE) 50 MCG/ACT nasal spray Place 2 sprays into both nostrils daily.  16 g  5  . furosemide (LASIX) 20 MG tablet Take 1 tablet (20 mg total) by mouth daily as needed.  30 tablet  4  . glipiZIDE (GLUCOTROL) 5 MG tablet TAKE 1 TABLET BY MOUTH EVERY MORNING AND1/2 TABLET EVERY AFTERNOON  45 tablet  5  . hydrALAZINE (APRESOLINE) 10 MG tablet TAKE ONE TABLET BY MOUTH THREE TIMES DAILY FOR BLOOD PRESSURE  90 tablet  5  . hydrOXYzine (ATARAX/VISTARIL) 50 MG tablet Take 1 tablet (50 mg total) by mouth 3 (three) times daily as needed.  90 tablet  1  . levothyroxine (SYNTHROID, LEVOTHROID) 75 MCG  tablet TAKE ONE (1) TABLET BY MOUTH EACH DAY  90 tablet  1  . lovastatin (MEVACOR) 40 MG tablet TAKE ONE TABLET BY MOUTH EVERY NIGHT AT BEDTIME  30 tablet  5  . LYRICA 100 MG capsule TAKE 1 CAPSULE BY MOUTH THREE TIMES A DAY  90 capsule  0  . metoprolol (LOPRESSOR) 50 MG tablet Take 1 tablet (50 mg total) by mouth 2 (two) times daily. As needed for increased heart rate  60 tablet  5  . metroNIDAZOLE (FLAGYL) 500 MG tablet Take 1 tablet (500 mg total) by mouth 3 (three) times daily.  21 tablet  0  . NITROSTAT 0.4 MG SL tablet DISSOLVE 1 TABLET UNDER THE TONGUE FOR CHEST PAIN. MAY REPEAT EVERY 5MINUTES UP TO 3 DOSES. IF NO RELIEF, CALL 911**  25 tablet  5  . nortriptyline (PAMELOR) 50 MG capsule TAKE ONE CAPSULE EACH NIGHT AT BEDTIME  30 capsule  0  . nystatin (MYCOSTATIN) 100000 UNIT/ML suspension Take 5 mLs (500,000 Units total) by mouth 4 (four) times daily.  60 mL  0  . nystatin cream (MYCOSTATIN) Apply 1 application topically 2 (two) times daily as needed.  30 g  1  . Olopatadine HCl (PATADAY) 0.2 % SOLN Apply one drop to each eye daily  3 Bottle  1  . Oxycodone HCl 10 MG TABS Take 1 tablet (10 mg total) by mouth 2 (two) times daily as needed.  60 each  0  . tiotropium (SPIRIVA HANDIHALER) 18 MCG inhalation capsule Place 1 capsule (18 mcg total) into inhaler and inhale daily.  30 capsule  5  . topiramate (TOPAMAX) 100 MG tablet Take 3 tablets by mouth daily.  90 tablet  6  . zolpidem (AMBIEN) 10 MG tablet TAKE ONE TABLET BY MOUTH EVERY NIGHT AT BEDTIME  30 tablet  1   No current facility-administered medications on file prior to visit.     The PMH, PSH, Social History, Family History, Medications, and allergies have been reviewed in [facility], and have been updated if relevant.  Review of Systems       See HPI   Physical Exam BP 152/86  Pulse 77  Temp(Src) 97.7 F (36.5 C) (Oral)  Ht 5\' 4"  (1.626 m)  Wt 222 lb 8 oz (100.925 kg)  BMI 38.17 kg/m2  SpO2 96%  General:  alert and  overweight-appearing, no acute distress.   Head:  normocephalic, atraumatic, and no abnormalities observed.   Mouth:  pharynx pink and moist.   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 Lungs are clear to auscultation, no  crackles or wheezes. Heart:  Normal rate and regular rhythm. S1 and S2 normal without gallop, murmur, click, rub or other extra sounds. Abdomen:  Bowel sounds positive,abdomen soft and non-tender without masses, organomegaly or hernias noted. Msk:  gross deformity of left arm/hand. Pulses:  R and L carotid,radial,femoral,dorsalis pedis and posterior tibial pulses are full and equal bilaterally Neurologic:  alert & oriented X3 and gait normal.   Skin:  Intact without suspicious lesions or rashes Psych:  Oriented X3 and good eye contact.   Diabetic foot exam: Normal inspection No skin breakdown No calluses  Normal DP pulses Normal sensation to light touch and monofilament Nails normal  Assessment and Plan:

## 2013-03-11 NOTE — Addendum Note (Signed)
Addended by: Despina Hidden on: 03/11/2013 12:07 PM   Modules accepted: Orders

## 2013-03-11 NOTE — Assessment & Plan Note (Signed)
She declines surgical referral for removal of lipoma- I discussed with her that should it grow, it could worsen her neck pain (already could be contributing).

## 2013-03-11 NOTE — Assessment & Plan Note (Signed)
Recheck thyroid function today.  

## 2013-03-11 NOTE — Assessment & Plan Note (Signed)
Certainly may be diverticulitis when it occurs but I still would like for her to have a colonoscopy which she is again refusing today. She does agree to CT of abdomen and pelvis- I have ordered this today.

## 2013-03-11 NOTE — Patient Instructions (Signed)
Good to see you. We are getting an xray here today. Please stop by to see Rosaria Ferries on your way out to set up your CT scan.  I will call you with your lab results.

## 2013-03-12 ENCOUNTER — Ambulatory Visit: Payer: Medicare Other | Admitting: Family Medicine

## 2013-03-12 LAB — BASIC METABOLIC PANEL
BUN: 13 mg/dL (ref 6–23)
CALCIUM: 9.4 mg/dL (ref 8.4–10.5)
CO2: 27 mEq/L (ref 19–32)
CREATININE: 1.1 mg/dL (ref 0.4–1.2)
Chloride: 106 mEq/L (ref 96–112)
GFR: 55.31 mL/min — AB (ref >60.00)
Glucose, Bld: 123 mg/dL — ABNORMAL HIGH (ref 70–99)
Potassium: 4 mEq/L (ref 3.5–5.1)
Sodium: 139 mEq/L (ref 135–145)

## 2013-03-12 LAB — TSH: TSH: 2.99 u[IU]/mL (ref 0.35–5.50)

## 2013-03-13 NOTE — Telephone Encounter (Signed)
Thanks.  Forms completed and in the maroon box on my desk.

## 2013-03-16 ENCOUNTER — Telehealth: Payer: Self-pay | Admitting: Family Medicine

## 2013-03-16 NOTE — Telephone Encounter (Signed)
Patient was scheduled for CT Abd/Pel at [facility] for 03/19/13, FYI, the patient has rescheduled this appt to April 8th at [facility], wanted you to be aware.

## 2013-03-16 NOTE — Telephone Encounter (Signed)
Pt left v/m; pt said if [facility] contacts Dr Deborra Medina about back brace or TENS unit to deny request. Pt does not want back brace or TENS unit.

## 2013-03-17 NOTE — Telephone Encounter (Signed)
Forms faxed back and scanned

## 2013-03-18 ENCOUNTER — Telehealth: Payer: Self-pay | Admitting: Family Medicine

## 2013-03-18 ENCOUNTER — Other Ambulatory Visit: Payer: Self-pay | Admitting: Family Medicine

## 2013-03-18 NOTE — Telephone Encounter (Signed)
Pt dropped off pre-auth forms for completion

## 2013-03-18 NOTE — Telephone Encounter (Signed)
Pt requesting medication refill. Last ov 03/12/13 with upcoming 06/2013 appts. pls advise

## 2013-03-19 ENCOUNTER — Other Ambulatory Visit: Payer: Self-pay | Admitting: Family Medicine

## 2013-03-19 ENCOUNTER — Other Ambulatory Visit: Payer: Self-pay | Admitting: Internal Medicine

## 2013-03-19 NOTE — Telephone Encounter (Signed)
Pt came to office and brought prior auth form for lidocaine patches. Per pt is was denied by insurance because of her back pain and spasms. Pt states she needs lidocaine when she sleeps to relax her body and stop spasms to rest. She has taken other meds in the past and nothing works, she states "ativan me a stroke" and muscle relaxers affected her kidneys.

## 2013-03-19 NOTE — Telephone Encounter (Signed)
Pt requesting medication refill. Last ov 03/12/13. pls advise

## 2013-03-20 NOTE — Telephone Encounter (Signed)
Spoke to pt and informed her of Rx sent to requested pharmacy as well as denial of medication as it is too soon to refill.

## 2013-03-20 NOTE — Telephone Encounter (Signed)
Pt requesting medication refill. Last ov 03/2013 with upcoming 07/2013 appt. pls advise

## 2013-03-23 ENCOUNTER — Other Ambulatory Visit: Payer: Self-pay | Admitting: Family Medicine

## 2013-03-23 NOTE — Telephone Encounter (Signed)
Electronic refill request.  Please advise. 

## 2013-03-23 NOTE — Telephone Encounter (Signed)
Pt requesting medication refill. pls advise 

## 2013-03-23 NOTE — Telephone Encounter (Signed)
Spoke to pt and informed her Rx has been faxed to requested pharmacy 

## 2013-03-23 NOTE — Telephone Encounter (Signed)
Last filled 01/20/13--please advise

## 2013-03-25 NOTE — Telephone Encounter (Signed)
Spoke to pt and informed her Rx has been called in to requested pharmacy 

## 2013-03-26 ENCOUNTER — Other Ambulatory Visit: Payer: Self-pay | Admitting: *Deleted

## 2013-03-26 MED ORDER — MAGIC MOUTHWASH W/LIDOCAINE: ORAL | Status: DC

## 2013-03-26 NOTE — Telephone Encounter (Signed)
Pt requesting medication refill. Last filled 06/2012

## 2013-03-27 NOTE — Telephone Encounter (Signed)
Many insurance companies are now only approving this for post herpetic neuralgia which she does not have.  Unfortunately, there is not much I can do about this.

## 2013-04-14 ENCOUNTER — Other Ambulatory Visit: Payer: Self-pay | Admitting: Family Medicine

## 2013-04-14 NOTE — Telephone Encounter (Signed)
Spoke to pt and informed him Rx has been faxed to requested pharmacy 

## 2013-04-14 NOTE — Telephone Encounter (Signed)
Pt requesting medication refill. Last ov 02/15 with f/u appt 6/15. pls advise

## 2013-05-04 ENCOUNTER — Ambulatory Visit (INDEPENDENT_AMBULATORY_CARE_PROVIDER_SITE_OTHER): Payer: Medicare Other | Admitting: Family Medicine

## 2013-05-04 ENCOUNTER — Encounter: Payer: Self-pay | Admitting: Family Medicine

## 2013-05-04 VITALS — BP 144/88 | HR 84 | Temp 98.3°F | Wt 218.5 lb

## 2013-05-04 DIAGNOSIS — J069 Acute upper respiratory infection, unspecified: Secondary | ICD-10-CM | POA: Diagnosis not present

## 2013-05-04 MED ORDER — PHENYLEPHRINE-GUAIFENESIN 30-400 MG PO CP12: ORAL_CAPSULE | ORAL | Status: DC

## 2013-05-04 MED ORDER — AMOXICILLIN-POT CLAVULANATE 875-125 MG PO TABS: 1.0000 | ORAL_TABLET | Freq: Two times a day (BID) | ORAL | Status: DC

## 2013-05-04 NOTE — Progress Notes (Signed)
SUBJECTIVE:  Mariah Reed is a 68 y.o. female who complains of coryza, congestion, sneezing and right sinus pain for 12 days. She denies a history of anorexia, chest pain and shortness of breath and denies a history of asthma. Patient denies smoke cigarettes.   Patient Active Problem List   Diagnosis Date Noted  . Neck pain on right side 03/11/2013  . Lipoma of back 03/11/2013  . Abdominal pain, right lower quadrant 11/11/2012  . Headache(784.0) 10/29/2012  . Rectal bleeding 10/29/2012  . Family conflict 76/16/0737  . Thrush 06/09/2012  . Chronic headaches 04/03/2012  . Hypothyroidism 09/18/2010  . Vitamin D deficiency 06/14/2010  . ECHOCARDIOGRAM, ABNORMAL 03/16/2010  . SINUSITIS, CHRONIC 03/15/2010  . MYOCARDIAL INFARCTION, HX OF 02/09/2010  . LEFT BUNDLE BRANCH BLOCK 12/26/2009  . GERD 12/26/2009  . COPD 11/10/2009  . DIABETES MELLITUS, TYPE II 03/12/2006  . HYPERLIPIDEMIA 03/12/2006  . GOUT 03/12/2006  . PERIPHERAL NEUROPATHY 03/12/2006  . HYPERTENSION 03/12/2006  . ALLERGIC RHINITIS 03/12/2006   Past Medical History  Diagnosis Date  . Allergy   . Diabetes mellitus   . Gout   . Hyperlipidemia   . Hypertension   . COPD (chronic obstructive pulmonary disease)   . Peripheral neuropathy   . Low back pain    Past Surgical History  Procedure Laterality Date  . Polypectomy      Colon  . Abdominal hysterectomy    . Tubal ligation     History  Substance Use Topics  . Smoking status: Never Smoker   . Smokeless tobacco: Never Used  . Alcohol Use: No   Family History  Problem Relation Age of Onset  . Uterine cancer Mother    Allergies  Allergen Reactions  . Levofloxacin Hives   Current Outpatient Prescriptions on File Prior to Visit  Medication Sig Dispense Refill  . ACCU-CHEK FASTCLIX LANCETS MISC 1 Device by Does not apply route 3 (three) times daily. Use to check blood sugar up to three times a day  102 each  12  . albuterol (PROAIR HFA) 108 (90 BASE)  MCG/ACT inhaler INHALE 2 PUFFS INTO THE LUNGS EVERY 4 HOURS AS NEEDED  8.5 g  2  . Alum & Mag Hydroxide-Simeth (MAGIC MOUTHWASH W/LIDOCAINE) SOLN Swish spit 5 ml four times daily as needed.  240 mL  0  . diphenhydrAMINE (BENADRYL) 25 MG tablet Take 25 mg by mouth as needed.        Marland Kitchen esomeprazole (NEXIUM) 40 MG capsule TAKE 1 TO 2 CAPSULES BY MOUTH ONCE DAILY  180 capsule  1  . fexofenadine (ALLEGRA) 180 MG tablet Take 180 mg by mouth as needed.        Marland Kitchen FLECTOR 1.3 % PTCH APPLY 1 PATCH TO THE SKIN AS DIRECTED BYYOUR PHYSICIAN  60 patch  0  . fluticasone (FLONASE) 50 MCG/ACT nasal spray Place 2 sprays into both nostrils daily.  16 g  5  . furosemide (LASIX) 20 MG tablet Take 1 tablet (20 mg total) by mouth daily as needed.  30 tablet  4  . glipiZIDE (GLUCOTROL) 5 MG tablet TAKE 1 TABLET BY MOUTH EVERY MORNING AND1/2 TABLET EVERY AFTERNOON  45 tablet  5  . hydrALAZINE (APRESOLINE) 10 MG tablet TAKE ONE TABLET BY MOUTH THREE TIMES DAILY FOR BLOOD PRESSURE  90 tablet  5  . hydrOXYzine (ATARAX/VISTARIL) 10 MG tablet Take 1 tablet (10 mg total) by mouth 3 (three) times daily as needed.  90 tablet  3  . levothyroxine (SYNTHROID,  LEVOTHROID) 75 MCG tablet TAKE ONE (1) TABLET BY MOUTH EACH DAY  90 tablet  1  . lovastatin (MEVACOR) 40 MG tablet TAKE ONE TABLET BY MOUTH EVERY NIGHT AT BEDTIME  30 tablet  5  . LYRICA 100 MG capsule TAKE 1 CAPSULE BY MOUTH THREE TIMES A DAY  90 capsule  0  . metoprolol (LOPRESSOR) 50 MG tablet Take 1 tablet (50 mg total) by mouth 2 (two) times daily. As needed for increased heart rate  60 tablet  5  . NITROSTAT 0.4 MG SL tablet DISSOLVE 1 TABLET UNDER THE TONGUE FOR CHEST PAIN. MAY REPEAT EVERY 5MINUTES UP TO 3 DOSES. IF NO RELIEF, CALL 911**  25 tablet  5  . nortriptyline (PAMELOR) 50 MG capsule TAKE ONE CAPSULE EACH NIGHT AT BEDTIME  30 capsule  0  . nortriptyline (PAMELOR) 50 MG capsule TAKE ONE CAPSULE BY MOUTH EVERY NIGHT AT BEDTIME  30 capsule  0  . nystatin (MYCOSTATIN)  100000 UNIT/ML suspension TAKE ONE (1) TEASPOONFUL FOUR (4) TIMES DAILY AS DIRECTED  60 mL  0  . nystatin cream (MYCOSTATIN) Apply 1 application topically 2 (two) times daily as needed.  30 g  1  . Olopatadine HCl (PATADAY) 0.2 % SOLN Apply one drop to each eye daily  3 Bottle  1  . Oxycodone HCl 10 MG TABS Take 1 tablet (10 mg total) by mouth 2 (two) times daily as needed.  60 each  0  . PREMARIN 0.625 MG tablet TAKE 1 TABLET BY MOUTH DAILY  30 tablet  0  . tiotropium (SPIRIVA HANDIHALER) 18 MCG inhalation capsule Place 1 capsule (18 mcg total) into inhaler and inhale daily.  30 capsule  5  . topiramate (TOPAMAX) 100 MG tablet Take 3 tablets by mouth daily.  90 tablet  6  . zolpidem (AMBIEN) 10 MG tablet TAKE ONE TABLET BY MOUTH EVERY NIGHT AT BEDTIME  30 tablet  0   No current facility-administered medications on file prior to visit.   The PMH, PSH, Social History, Family History, Medications, and allergies have been reviewed in [facility], and have been updated if relevant.  OBJECTIVE: BP 144/88  Pulse 84  Temp(Src) 98.3 F (36.8 C) (Oral)  Wt 218 lb 8 oz (99.111 kg)  SpO2 97%  She appears well, vital signs are as noted. Ears normal.  Throat and pharynx normal.  Neck supple. No adenopathy in the neck. Nose is congested. Sinuses tender. The chest is clear, without wheezes or rales.  ASSESSMENT:  sinusitis  PLAN: Given duration and progression of symptoms, will treat for bacterial sinusitis with Augmentin.  Symptomatic therapy suggested: push fluids, rest and return office visit prn if symptoms persist or worsen.Call or return to clinic prn if these symptoms worsen or fail to improve as anticipated.

## 2013-05-04 NOTE — Progress Notes (Signed)
Pre visit review using our clinic review tool, if applicable. No additional management support is needed unless otherwise documented below in the visit note. 

## 2013-05-04 NOTE — Patient Instructions (Signed)
Good to see you. Take Augmentin as directed- 1 tablet twice daily x 10 days. Entex as needed for cough and congestion.

## 2013-05-18 ENCOUNTER — Other Ambulatory Visit: Payer: Self-pay | Admitting: Family Medicine

## 2013-05-18 NOTE — Telephone Encounter (Signed)
Pt requesting medication refill. Last ov acute 04/2013 with 07/2013 appt scheduled. pls advise

## 2013-05-18 NOTE — Telephone Encounter (Signed)
Spoke to pt and informed her Rx has been faxed to requested pharmacy 

## 2013-05-18 NOTE — Telephone Encounter (Signed)
Received refill request electronically from pharmacy. Last refill 04/14/13, last office visit 05/04/13. Is it okay to refill medication?

## 2013-05-19 NOTE — Telephone Encounter (Signed)
Spoke to pt and informed her Rx has been sent to requested pharmacy 

## 2013-06-03 ENCOUNTER — Other Ambulatory Visit: Payer: Self-pay | Admitting: Family Medicine

## 2013-06-05 ENCOUNTER — Telehealth: Payer: Self-pay | Admitting: Family Medicine

## 2013-06-05 DIAGNOSIS — M810 Age-related osteoporosis without current pathological fracture: Secondary | ICD-10-CM

## 2013-06-05 MED ORDER — ESTRADIOL 0.5 MG PO TABS: 0.5000 mg | ORAL_TABLET | Freq: Every day | ORAL | Status: DC

## 2013-06-05 MED ORDER — ALENDRONATE SODIUM 70 MG PO TABS: 70.0000 mg | ORAL_TABLET | ORAL | Status: DC

## 2013-06-05 NOTE — Telephone Encounter (Signed)
Received a note from [facility] stating the pt states estradiol is covered for her hot flashes and alendronate is covered for osteoporosis.   She is requesting new rxs sent.  Rx sent but needs repeat bone density.  Referral placed.

## 2013-06-16 ENCOUNTER — Other Ambulatory Visit: Payer: Self-pay | Admitting: Family Medicine

## 2013-06-17 NOTE — Telephone Encounter (Signed)
Pt requesting medication refill. Last ov 04/2013 with 07/2013 f/u appt. pls advise

## 2013-06-17 NOTE — Telephone Encounter (Signed)
Pt requesting medication refill of abx. Last seen 04/2013 with 07/2013 appt sched. pls advise

## 2013-06-18 ENCOUNTER — Other Ambulatory Visit: Payer: Self-pay | Admitting: *Deleted

## 2013-06-18 ENCOUNTER — Ambulatory Visit (INDEPENDENT_AMBULATORY_CARE_PROVIDER_SITE_OTHER): Payer: Medicare Other | Admitting: Family Medicine

## 2013-06-18 ENCOUNTER — Encounter: Payer: Self-pay | Admitting: Family Medicine

## 2013-06-18 VITALS — BP 138/84 | HR 91 | Temp 98.1°F | Wt 221.0 lb

## 2013-06-18 DIAGNOSIS — J029 Acute pharyngitis, unspecified: Secondary | ICD-10-CM | POA: Diagnosis not present

## 2013-06-18 MED ORDER — ZOLPIDEM TARTRATE 5 MG PO TABS: 5.0000 mg | ORAL_TABLET | Freq: Every evening | ORAL | Status: DC | PRN

## 2013-06-18 MED ORDER — AMOXICILLIN-POT CLAVULANATE 875-125 MG PO TABS: 1.0000 | ORAL_TABLET | Freq: Two times a day (BID) | ORAL | Status: DC

## 2013-06-18 NOTE — Telephone Encounter (Signed)
Spoke to pt who states that she is having another sinus infection. Pt informed she must be seen to receive abx; saw that she has appt today with Gordy Savers, MD

## 2013-06-18 NOTE — Telephone Encounter (Signed)
Spoke to pt while at [place] today with [place]. Insurance will no longer cover ambien 10mg  due to age and fall risk, is it ok that she be reduced to 5mg  to see if it can be approved? pls advise

## 2013-06-18 NOTE — Progress Notes (Signed)
Pre visit review using our clinic review tool, if applicable. No additional management support is needed unless otherwise documented below in the visit note.  Going on for about 4 days.  Stuffy, ST, congested.  Noted some white spots on her throat per her report.  She took a "leftover augmentin" prescription in the meantime. The white patches resolved after the abx. Fever recently.  No vomiting, no diarrhea.   Meds, vitals, and allergies reviewed.   ROS: See HPI.  Otherwise, noncontributory.  GEN: nad, alert and oriented HEENT: mucous membranes moist, tm w/o erythema, nasal exam w/o erythema, clear discharge noted,  OP with cobblestoning NECK: supple w/o LA CV: rrr.   PULM: ctab, no inc wob EXT: no edema SKIN: no acute rash Chronic changes to L hand noted

## 2013-06-18 NOTE — Telephone Encounter (Signed)
Rx called in to requested pharmcy 

## 2013-06-18 NOTE — Telephone Encounter (Signed)
Spoke to pharmacy who states that the pt has exceeded her amount for the year and is the reasoning behind her not being able to receive 10mg . Pharmacist states that the pt picked up #30 today and they will not be able to resubmit to see if 5mg  is covered. Pt will have to automatically pay out of pocket.

## 2013-06-18 NOTE — Patient Instructions (Signed)
Start the antibiotics today and use mucinex D in the meantime.  Drink plenty of water.  Glad to see you.

## 2013-06-18 NOTE — Telephone Encounter (Signed)
We do not send in abx without evaluating patients.  What is this for?

## 2013-06-19 DIAGNOSIS — J029 Acute pharyngitis, unspecified: Secondary | ICD-10-CM | POA: Insufficient documentation

## 2013-06-19 NOTE — Assessment & Plan Note (Signed)
No thrush, unclear if she had strep vs sinusitis, would finish off augmentin and f/u prn.. Nontoxic.

## 2013-06-20 ENCOUNTER — Other Ambulatory Visit: Payer: Self-pay | Admitting: Family Medicine

## 2013-06-22 ENCOUNTER — Encounter: Payer: Self-pay | Admitting: Family Medicine

## 2013-06-22 ENCOUNTER — Ambulatory Visit: Payer: Self-pay | Admitting: Family Medicine

## 2013-06-22 DIAGNOSIS — R1031 Right lower quadrant pain: Secondary | ICD-10-CM | POA: Diagnosis not present

## 2013-06-22 DIAGNOSIS — Z9089 Acquired absence of other organs: Secondary | ICD-10-CM | POA: Diagnosis not present

## 2013-06-22 DIAGNOSIS — R933 Abnormal findings on diagnostic imaging of other parts of digestive tract: Secondary | ICD-10-CM | POA: Diagnosis not present

## 2013-06-22 DIAGNOSIS — N200 Calculus of kidney: Secondary | ICD-10-CM | POA: Diagnosis not present

## 2013-06-22 DIAGNOSIS — Z90710 Acquired absence of both cervix and uterus: Secondary | ICD-10-CM | POA: Diagnosis not present

## 2013-06-22 DIAGNOSIS — M5137 Other intervertebral disc degeneration, lumbosacral region: Secondary | ICD-10-CM | POA: Diagnosis not present

## 2013-06-22 DIAGNOSIS — K625 Hemorrhage of anus and rectum: Secondary | ICD-10-CM | POA: Diagnosis not present

## 2013-06-22 NOTE — Telephone Encounter (Signed)
Pt requesting medication refill. Ok to refill?

## 2013-06-24 ENCOUNTER — Telehealth: Payer: Self-pay | Admitting: Family Medicine

## 2013-06-24 MED ORDER — TAMSULOSIN HCL 0.4 MG PO CAPS: 0.4000 mg | ORAL_CAPSULE | Freq: Every day | ORAL | Status: DC

## 2013-06-24 NOTE — Addendum Note (Signed)
Addended by: Lucille Passy on: 06/24/2013 03:42 PM   Modules accepted: Orders

## 2013-06-24 NOTE — Telephone Encounter (Signed)
Patient is asking for you to send the prescription for Flomax to [facility].

## 2013-06-24 NOTE — Telephone Encounter (Signed)
Rx sent 

## 2013-07-09 ENCOUNTER — Ambulatory Visit: Payer: Medicare Other | Admitting: Family Medicine

## 2013-07-14 ENCOUNTER — Encounter: Payer: Self-pay | Admitting: Family Medicine

## 2013-07-14 ENCOUNTER — Ambulatory Visit (INDEPENDENT_AMBULATORY_CARE_PROVIDER_SITE_OTHER): Payer: Medicare Other | Admitting: Family Medicine

## 2013-07-14 VITALS — BP 130/82 | HR 115 | Temp 97.9°F | Wt 218.8 lb

## 2013-07-14 DIAGNOSIS — E039 Hypothyroidism, unspecified: Secondary | ICD-10-CM | POA: Diagnosis not present

## 2013-07-14 DIAGNOSIS — E119 Type 2 diabetes mellitus without complications: Secondary | ICD-10-CM

## 2013-07-14 DIAGNOSIS — K219 Gastro-esophageal reflux disease without esophagitis: Secondary | ICD-10-CM

## 2013-07-14 DIAGNOSIS — R51 Headache: Secondary | ICD-10-CM

## 2013-07-14 DIAGNOSIS — Z79890 Hormone replacement therapy (postmenopausal): Secondary | ICD-10-CM

## 2013-07-14 DIAGNOSIS — I10 Essential (primary) hypertension: Secondary | ICD-10-CM | POA: Diagnosis not present

## 2013-07-14 DIAGNOSIS — J029 Acute pharyngitis, unspecified: Secondary | ICD-10-CM

## 2013-07-14 DIAGNOSIS — N951 Menopausal and female climacteric states: Secondary | ICD-10-CM

## 2013-07-14 DIAGNOSIS — G609 Hereditary and idiopathic neuropathy, unspecified: Secondary | ICD-10-CM | POA: Diagnosis not present

## 2013-07-14 DIAGNOSIS — E785 Hyperlipidemia, unspecified: Secondary | ICD-10-CM

## 2013-07-14 MED ORDER — OXYCODONE HCL 10 MG PO TABS: 10.0000 mg | ORAL_TABLET | Freq: Two times a day (BID) | ORAL | Status: DC | PRN

## 2013-07-14 MED ORDER — DEXAMETHASONE SODIUM PHOSPHATE 10 MG/ML IJ SOLN
10.0000 mg | Freq: Once | INTRAMUSCULAR | Status: AC
  Administered 2013-07-14: 10 mg via INTRAVENOUS

## 2013-07-14 NOTE — Assessment & Plan Note (Addendum)
Deteriorated, likely due to diet. Continue Nexium. GERD friendly diet discussed, handout given- see AVS.

## 2013-07-14 NOTE — Assessment & Plan Note (Signed)
On statin. Recheck labs today.

## 2013-07-14 NOTE — Progress Notes (Signed)
68 yo with multiple medical problems here for follow up.  Sore throat-  Throat has been hurting for over a month.  S/p abx that have not helped.  Using gargles.  No fevers or chills.  Does have PND.  Taking Benadryl, Allegra D, vistaril without improvement of sore throat.  Hypothyroidism- does complain of more fatigue.  Denies any other symptoms of hypo or hyperthyroidism. Lab Results  Component Value Date   TSH 2.99 03/11/2013    DM- CBGs stable.  Reasonable control on Glucotrol 5 mg daily.  CBGs no higher than 223.  Checks CBG 2-3 times daily.  Checks CBGs once daily.  Lab Results  Component Value Date   HGBA1C 7.0* 03/11/2013   Chronic pain- managing ok, Oxycodone and nortriptyline have helped.  Taking care of her husband who is also disabled.  Lab Results  Component Value Date   CHOL 158 09/09/2012   HDL 52.50 09/09/2012   LDLCALC 76 09/09/2012   LDLDIRECT 135.8 11/10/2009   TRIG 149.0 09/09/2012   CHOLHDL 3 09/09/2012   Taking Nexium but still has gnawing pain at night at times.  No belching.  No black or bloody stools.  Patient Active Problem List   Diagnosis Date Noted  . Acute pharyngitis 07/14/2013  . Menopausal syndrome on hormone replacement therapy 07/14/2013  . Lipoma of back 03/11/2013  . Headache(784.0) 10/29/2012  . Rectal bleeding 10/29/2012  . Family conflict 95/63/8756  . Thrush 06/09/2012  . Chronic headaches 04/03/2012  . Hypothyroidism 09/18/2010  . Vitamin D deficiency 06/14/2010  . ECHOCARDIOGRAM, ABNORMAL 03/16/2010  . SINUSITIS, CHRONIC 03/15/2010  . MYOCARDIAL INFARCTION, HX OF 02/09/2010  . LEFT BUNDLE BRANCH BLOCK 12/26/2009  . GERD 12/26/2009  . COPD 11/10/2009  . DIABETES MELLITUS, TYPE II 03/12/2006  . HYPERLIPIDEMIA 03/12/2006  . GOUT 03/12/2006  . PERIPHERAL NEUROPATHY 03/12/2006  . HYPERTENSION 03/12/2006  . ALLERGIC RHINITIS 03/12/2006   Past Medical History  Diagnosis Date  . Allergy   . Diabetes mellitus   . Gout   . Hyperlipidemia    . Hypertension   . COPD (chronic obstructive pulmonary disease)   . Peripheral neuropathy   . Low back pain    Past Surgical History  Procedure Laterality Date  . Polypectomy      Colon  . Abdominal hysterectomy    . Tubal ligation     History  Substance Use Topics  . Smoking status: Never Smoker   . Smokeless tobacco: Never Used  . Alcohol Use: No   Family History  Problem Relation Age of Onset  . Uterine cancer Mother    Allergies  Allergen Reactions  . Levofloxacin Hives   Current Outpatient Prescriptions on File Prior to Visit  Medication Sig Dispense Refill  . ACCU-CHEK FASTCLIX LANCETS MISC 1 Device by Does not apply route 3 (three) times daily. Use to check blood sugar up to three times a day  102 each  12  . albuterol (PROAIR HFA) 108 (90 BASE) MCG/ACT inhaler INHALE 2 PUFFS INTO THE LUNGS EVERY 4 HOURS AS NEEDED  8.5 g  2  . Alum & Mag Hydroxide-Simeth (MAGIC MOUTHWASH W/LIDOCAINE) SOLN Swish spit 5 ml four times daily as needed.  240 mL  0  . diphenhydrAMINE (BENADRYL) 25 MG tablet Take 25 mg by mouth as needed.        Marland Kitchen estradiol (ESTRACE) 0.5 MG tablet Take 1 tablet (0.5 mg total) by mouth daily.  30 tablet  3  . fexofenadine (ALLEGRA) 180  MG tablet Take 180 mg by mouth as needed.        Marland Kitchen FLECTOR 1.3 % PTCH APPLY 1 PATCH ONTO THE SKIN AS DIRECTED BY YOUR DOCTOR  60 patch  3  . fluticasone (FLONASE) 50 MCG/ACT nasal spray USE TWO SPRAYS IN EACH NOSTRIL DAILY  16 g  1  . furosemide (LASIX) 20 MG tablet Take 1 tablet (20 mg total) by mouth daily as needed.  30 tablet  4  . glipiZIDE (GLUCOTROL) 5 MG tablet TAKE 1 TABLET BY MOUTH IN THE MORNING AND 1/2 TABLET EVERY AFTERNOON  90 tablet  0  . hydrALAZINE (APRESOLINE) 10 MG tablet TAKE ONE TABLET BY MOUTH THREE TIMES DAILY FOR BLOOD PRESSURE  90 tablet  5  . hydrOXYzine (ATARAX/VISTARIL) 10 MG tablet Take 1 tablet (10 mg total) by mouth 3 (three) times daily as needed.  90 tablet  3  . levothyroxine (SYNTHROID,  LEVOTHROID) 75 MCG tablet TAKE 1 TABLET BY MOUTH DAILY  30 tablet  1  . lovastatin (MEVACOR) 40 MG tablet TAKE ONE TABLET BY MOUTH EVERY NIGHT AT BEDTIME  30 tablet  5  . LYRICA 100 MG capsule TAKE 1 CAPSULE BY MOUTH THREE TIMES A DAY  90 capsule  1  . metoprolol (LOPRESSOR) 50 MG tablet Take 1 tablet (50 mg total) by mouth 2 (two) times daily. As needed for increased heart rate  60 tablet  5  . NEXIUM 40 MG capsule TAKE 1 TO 2 CAPSULES BY MOUTH EVERY DAY AS DIRECTED.  180 capsule  0  . NITROSTAT 0.4 MG SL tablet DISSOLVE 1 TABLET UNDER THE TONGUE FOR CHEST PAIN. MAY REPEAT EVERY 5MINUTES UP TO 3 DOSES. IF NO RELIEF, CALL 911**  25 tablet  5  . nortriptyline (PAMELOR) 50 MG capsule TAKE ONE CAPSULE BY MOUTH EVERY NIGHT AT BEDTIME  30 capsule  0  . nystatin (MYCOSTATIN) 100000 UNIT/ML suspension TAKE 1 TEASPOONFUL (5ML) BY MOUTH 4 TIMES DAILY AS DIRECTED  60 mL  0  . nystatin cream (MYCOSTATIN) APPLY TO AFFECTED AREA(S) TWO TIMES PER DAY AS NEEDED  30 g  2  . Olopatadine HCl (PATADAY) 0.2 % SOLN Apply one drop to each eye daily  3 Bottle  1  . Phenylephrine-Guaifenesin 30-400 MG CP12 1 tab every 12 hours as needed for cough/ congestion  14 each  0  . tamsulosin (FLOMAX) 0.4 MG CAPS capsule Take 1 capsule (0.4 mg total) by mouth daily. 1 capsule by mouth daily until stone passes  30 capsule  3  . tiotropium (SPIRIVA HANDIHALER) 18 MCG inhalation capsule Place 1 capsule (18 mcg total) into inhaler and inhale daily.  30 capsule  5  . topiramate (TOPAMAX) 100 MG tablet Take 3 tablets by mouth daily.  90 tablet  6  . zolpidem (AMBIEN) 10 MG tablet TAKE ONE TABLET BY MOUTH EVERY NIGHT AT BEDTIME  30 tablet  0  . zolpidem (AMBIEN) 5 MG tablet Take 1 tablet (5 mg total) by mouth at bedtime as needed for sleep.  30 tablet  0   No current facility-administered medications on file prior to visit.     The PMH, PSH, Social History, Family History, Medications, and allergies have been reviewed in [facility], and have  been updated if relevant.  Review of Systems       See HPI  No difficulty swallowing No nausea or vomiting No worsening CP Physical Exam BP 130/82  Pulse 115  Temp(Src) 97.9 F (36.6 C) (Oral)  Wt 218 lb 12 oz (99.224 kg)  SpO2 98%  General:  alert and overweight-appearing, no acute distress.   Head:  normocephalic, atraumatic, and no abnormalities observed.   Mouth:  pharynx pink and moist.   +PND Lungs:  Normal respiratory effort, chest expands symmetrically. Lungs are clear to auscultation, no crackles or wheezes. Heart:  Normal rate and regular rhythm. S1 and S2 normal without gallop, murmur, click, rub or other extra sounds. Abdomen:  Bowel sounds positive,abdomen soft and non-tender without masses, organomegaly or hernias noted. Msk:  gross deformity of left arm/hand. Pulses:  R and L carotid,radial,femoral,dorsalis pedis and posterior tibial pulses are full and equal bilaterally Neurologic:  alert & oriented X3 and gait normal.   Skin:  Intact without suspicious lesions or rashes   Assessment and Plan:

## 2013-07-14 NOTE — Patient Instructions (Signed)

## 2013-07-14 NOTE — Assessment & Plan Note (Signed)
On chronic pain meds. She has followed pain contract, UDS screening. Given prescriptions today.

## 2013-07-14 NOTE — Progress Notes (Signed)
Pre visit review using our clinic review tool, if applicable. No additional management support is needed unless otherwise documented below in the visit note. 

## 2013-07-14 NOTE — Assessment & Plan Note (Signed)
Recheck a1c today. Reasonable control.

## 2013-07-14 NOTE — Assessment & Plan Note (Signed)
Probable due to PND. She is taking antihistamines/decongestants/nasal steroids already. IM decadron given in office today to help with inflammation.

## 2013-07-15 ENCOUNTER — Other Ambulatory Visit: Payer: Self-pay | Admitting: Family Medicine

## 2013-07-16 NOTE — Telephone Encounter (Signed)
Pt requesting medication refill. Last f/u appt 06/2013. pls advise 

## 2013-07-16 NOTE — Addendum Note (Signed)
Addended by: Ellamae Sia on: 07/16/2013 09:43 AM   Modules accepted: Orders

## 2013-07-16 NOTE — Telephone Encounter (Signed)
Rx has been faxed to requested pharmacy; pt prefers not to receive calls

## 2013-07-20 ENCOUNTER — Other Ambulatory Visit: Payer: Self-pay | Admitting: Family Medicine

## 2013-07-20 ENCOUNTER — Telehealth: Payer: Self-pay

## 2013-07-20 ENCOUNTER — Other Ambulatory Visit (INDEPENDENT_AMBULATORY_CARE_PROVIDER_SITE_OTHER): Payer: Medicare Other

## 2013-07-20 DIAGNOSIS — J449 Chronic obstructive pulmonary disease, unspecified: Secondary | ICD-10-CM

## 2013-07-20 LAB — LIPID PANEL
CHOLESTEROL: 185 mg/dL (ref 0–200)
HDL: 52.1 mg/dL (ref >39.00)
LDL Cholesterol: 93 mg/dL (ref 0–99)
NonHDL: 132.9
Total CHOL/HDL Ratio: 4
Triglycerides: 202 mg/dL — ABNORMAL HIGH (ref 0.0–149.0)
VLDL: 40.4 mg/dL — ABNORMAL HIGH (ref 0.0–40.0)

## 2013-07-20 LAB — COMPREHENSIVE METABOLIC PANEL
ALBUMIN: 4.4 g/dL (ref 3.5–5.2)
ALT: 26 U/L (ref 0–35)
AST: 25 U/L (ref 0–37)
Alkaline Phosphatase: 93 U/L (ref 39–117)
BUN: 8 mg/dL (ref 6–23)
CALCIUM: 9.2 mg/dL (ref 8.4–10.5)
CHLORIDE: 107 meq/L (ref 96–112)
CO2: 24 meq/L (ref 19–32)
Creatinine, Ser: 1 mg/dL (ref 0.4–1.2)
GFR: 59.83 mL/min — ABNORMAL LOW (ref >60.00)
Glucose, Bld: 177 mg/dL — ABNORMAL HIGH (ref 70–99)
Potassium: 3.7 mEq/L (ref 3.5–5.1)
Sodium: 138 mEq/L (ref 135–145)
Total Bilirubin: 0.5 mg/dL (ref 0.2–1.2)
Total Protein: 7.5 g/dL (ref 6.0–8.3)

## 2013-07-20 LAB — MICROALBUMIN / CREATININE URINE RATIO
Creatinine,U: 229.9 mg/dL
Microalb Creat Ratio: 1.8 mg/g (ref 0.0–30.0)
Microalb, Ur: 4.1 mg/dL — ABNORMAL HIGH (ref 0.0–1.9)

## 2013-07-20 LAB — HEMOGLOBIN A1C: HEMOGLOBIN A1C: 7.7 % — AB (ref 4.6–6.5)

## 2013-07-20 NOTE — Addendum Note (Signed)
Addended by: Ellamae Sia on: 07/20/2013 02:41 PM   Modules accepted: Orders

## 2013-07-20 NOTE — Telephone Encounter (Signed)
Mariah Reed with A1 Diabetes left v/m; faxed request on 07/07/13 and 07/15/13 for medicare audit for dates of service 03/11/13 and 09/18/2011. Needs 250.01 as diagnosis coding and for pt to test three times a day due to fluctuating BS. Fax # [phone]. Mariah Reed with A1 Diabetes request cb.

## 2013-07-20 NOTE — Telephone Encounter (Signed)
Spoke to Mariah Reed who states that the paperwork will be due this Friday. She states 09/18/2011 note will need to indicate "fluctuating BS and pt needing to test 3xs per day." Otherwise Medicare will not cover as pt is not on insulin. Advised her that Dr Deborra Medina will not be in office until next week and will not be able to modify note until that time. Mariah Reed states that it will be denied and they will resend new paperwork to be completed.

## 2013-07-20 NOTE — Telephone Encounter (Signed)
Lm on Mariah Reed's vm requesting a call back

## 2013-07-21 ENCOUNTER — Other Ambulatory Visit: Payer: Self-pay | Admitting: Family Medicine

## 2013-07-23 ENCOUNTER — Telehealth: Payer: Self-pay | Admitting: Family Medicine

## 2013-07-23 NOTE — Telephone Encounter (Signed)
Patient received her lab results earlier and forgot to tell you that she has been eating pudding.  She said it wasn't sugar free.  Patient has been eating about 5 a day.  Patient wanted to apologize to you for not letting you know when you talked to her. She said she wasn't thinking.

## 2013-07-23 NOTE — Telephone Encounter (Signed)
Thanks. I'll let Dr. Deborra Medina know. It wouldn't affect her A1c that much though.

## 2013-08-14 ENCOUNTER — Other Ambulatory Visit: Payer: Self-pay | Admitting: Family Medicine

## 2013-08-14 NOTE — Telephone Encounter (Signed)
Lyrica and Ambien called in to [place].

## 2013-08-14 NOTE — Telephone Encounter (Addendum)
Last office visit 07/14/2013.  Lyrica last refilled 06/17/2013 for 90 with 1 refill.  There is ambien 5 mg & 10 mg on medication list.  Ok to refill?

## 2013-08-18 ENCOUNTER — Other Ambulatory Visit: Payer: Self-pay | Admitting: Family Medicine

## 2013-08-19 ENCOUNTER — Telehealth: Payer: Self-pay

## 2013-08-19 NOTE — Telephone Encounter (Signed)
Spoke to pt and advised per Dr Deborra Medina. Pt states that she is wanting a referral to GI to discuss further. Prefers to be seen in [place], if possible; advised to await a call from [facility] with scheduling details

## 2013-08-19 NOTE — Telephone Encounter (Signed)
Pt wants to know if can have med sent to [facility] to heal ulcers in stomach. Pt said the CT scan of abd done in 06/2013 indicated possible peptic ulcer disease and pt is taking the Nexium but pt thinks "Nexium is just holding ulcers at bay due to all the meds pt is taking that can be an irritant to the stomach. Pt still does not want GI referral; pt said abd pain has not changed, is the same as has been for some time(pt could not give time frame of weeks, months or years). Pt request cb.

## 2013-08-19 NOTE — Telephone Encounter (Signed)
Nexium is quite strong- it reduces secretion of acid in the stomach.  I would not add anything to this.  She would need to see GI if she would like to discuss alternatives- this is outside my scope of medical practice.

## 2013-09-14 ENCOUNTER — Other Ambulatory Visit: Payer: Self-pay | Admitting: Family Medicine

## 2013-09-14 NOTE — Telephone Encounter (Signed)
Pt requesting medication refill. Last f/u appt 07/2013 with upcoming 11/2013 appt. pls advise

## 2013-09-15 NOTE — Telephone Encounter (Signed)
Rx faxed to requested pharmacy 

## 2013-10-06 ENCOUNTER — Telehealth: Payer: Self-pay | Admitting: Family Medicine

## 2013-10-06 NOTE — Telephone Encounter (Signed)
Error

## 2013-10-08 ENCOUNTER — Other Ambulatory Visit: Payer: Self-pay | Admitting: *Deleted

## 2013-10-08 MED ORDER — ZOLPIDEM TARTRATE 10 MG PO TABS: ORAL_TABLET | ORAL | Status: DC

## 2013-10-08 MED ORDER — NORTRIPTYLINE HCL 50 MG PO CAPS: ORAL_CAPSULE | ORAL | Status: DC

## 2013-10-08 MED ORDER — PREGABALIN 100 MG PO CAPS: ORAL_CAPSULE | ORAL | Status: DC

## 2013-10-08 MED ORDER — DICLOFENAC EPOLAMINE 1.3 % TD PTCH: MEDICATED_PATCH | TRANSDERMAL | Status: DC

## 2013-10-08 MED ORDER — TOPIRAMATE 100 MG PO TABS: ORAL_TABLET | ORAL | Status: DC

## 2013-10-08 MED ORDER — ALBUTEROL SULFATE HFA 108 (90 BASE) MCG/ACT IN AERS: INHALATION_SPRAY | RESPIRATORY_TRACT | Status: DC

## 2013-10-08 MED ORDER — NYSTATIN 100000 UNIT/GM EX CREA: TOPICAL_CREAM | CUTANEOUS | Status: DC

## 2013-10-08 NOTE — Telephone Encounter (Signed)
Received fax from [facility] that pt is transferring to them, and she needs these rx's sent there. She was last seen for a f/u in 6/15, and she's scheduled for a 4 month f/u in 10/15. Ok to refill?

## 2013-10-13 ENCOUNTER — Other Ambulatory Visit: Payer: Self-pay | Admitting: Family Medicine

## 2013-10-13 NOTE — Telephone Encounter (Signed)
Lyrica and Ambien sent on 10/08/13 were printed instead of being sent electronically.

## 2013-10-15 MED ORDER — ZOLPIDEM TARTRATE 10 MG PO TABS: 10.0000 mg | ORAL_TABLET | Freq: Every evening | ORAL | Status: DC | PRN

## 2013-10-15 MED ORDER — PREGABALIN 100 MG PO CAPS: 100.0000 mg | ORAL_CAPSULE | Freq: Three times a day (TID) | ORAL | Status: DC

## 2013-10-15 NOTE — Addendum Note (Signed)
Addended by: Lurlean Nanny on: 10/15/2013 03:30 PM   Modules accepted: Orders

## 2013-10-29 ENCOUNTER — Telehealth: Payer: Self-pay | Admitting: *Deleted

## 2013-10-29 MED ORDER — HYDROXYZINE HCL 10 MG PO TABS: 10.0000 mg | ORAL_TABLET | Freq: Three times a day (TID) | ORAL | Status: DC | PRN

## 2013-10-30 NOTE — Telephone Encounter (Signed)
[facility] left v/m; [facility] had requested refill hydroxyzine 50 mg; pharmacy received refill for hydroxyzine for 10 mg. Pharmacy request cb to verify mg.

## 2013-11-02 NOTE — Telephone Encounter (Signed)
Pharmacy called back wanting verification on med. Please advise?

## 2013-11-09 ENCOUNTER — Other Ambulatory Visit: Payer: Self-pay | Admitting: Family Medicine

## 2013-11-09 ENCOUNTER — Encounter: Payer: Self-pay | Admitting: Family Medicine

## 2013-11-09 ENCOUNTER — Ambulatory Visit (INDEPENDENT_AMBULATORY_CARE_PROVIDER_SITE_OTHER): Payer: Medicare Other | Admitting: Family Medicine

## 2013-11-09 VITALS — BP 148/78 | HR 92 | Temp 98.2°F | Wt 208.8 lb

## 2013-11-09 DIAGNOSIS — Z23 Encounter for immunization: Secondary | ICD-10-CM | POA: Diagnosis not present

## 2013-11-09 DIAGNOSIS — Z79890 Hormone replacement therapy (postmenopausal): Secondary | ICD-10-CM

## 2013-11-09 DIAGNOSIS — E785 Hyperlipidemia, unspecified: Secondary | ICD-10-CM

## 2013-11-09 DIAGNOSIS — M5010 Cervical disc disorder with radiculopathy, unspecified cervical region: Secondary | ICD-10-CM

## 2013-11-09 DIAGNOSIS — N951 Menopausal and female climacteric states: Secondary | ICD-10-CM

## 2013-11-09 DIAGNOSIS — E118 Type 2 diabetes mellitus with unspecified complications: Secondary | ICD-10-CM | POA: Diagnosis not present

## 2013-11-09 LAB — COMPREHENSIVE METABOLIC PANEL
ALBUMIN: 4.5 g/dL (ref 3.5–5.2)
ALT: 21 U/L (ref 0–35)
AST: 22 U/L (ref 0–37)
Alkaline Phosphatase: 76 U/L (ref 39–117)
BILIRUBIN TOTAL: 0.6 mg/dL (ref 0.2–1.2)
BUN: 8 mg/dL (ref 6–23)
CO2: 31 meq/L (ref 19–32)
Calcium: 9.1 mg/dL (ref 8.4–10.5)
Chloride: 101 mEq/L (ref 96–112)
Creatinine, Ser: 0.8 mg/dL (ref 0.4–1.2)
GFR: 72.41 mL/min (ref >60.00)
GLUCOSE: 136 mg/dL — AB (ref 70–99)
Potassium: 3.4 mEq/L — ABNORMAL LOW (ref 3.5–5.1)
Sodium: 138 mEq/L (ref 135–145)
Total Protein: 8.1 g/dL (ref 6.0–8.3)

## 2013-11-09 LAB — HEMOGLOBIN A1C: HEMOGLOBIN A1C: 7.1 % — AB (ref 4.6–6.5)

## 2013-11-09 LAB — MICROALBUMIN / CREATININE URINE RATIO
CREATININE, U: 411 mg/dL
MICROALB UR: 15.8 mg/dL — AB (ref 0.0–1.9)
MICROALB/CREAT RATIO: 3.8 mg/g (ref 0.0–30.0)

## 2013-11-09 LAB — LIPID PANEL
CHOLESTEROL: 192 mg/dL (ref 0–200)
HDL: 48.8 mg/dL (ref >39.00)
LDL Cholesterol: 109 mg/dL — ABNORMAL HIGH (ref 0–99)
NonHDL: 143.2
Total CHOL/HDL Ratio: 4
Triglycerides: 171 mg/dL — ABNORMAL HIGH (ref 0.0–149.0)
VLDL: 34.2 mg/dL (ref 0.0–40.0)

## 2013-11-09 MED ORDER — ESTRADIOL 0.1 MG/GM VA CREA: 1.0000 | TOPICAL_CREAM | VAGINAL | Status: DC

## 2013-11-09 MED ORDER — OXYCODONE HCL 10 MG PO TABS: 10.0000 mg | ORAL_TABLET | Freq: Two times a day (BID) | ORAL | Status: DC | PRN

## 2013-11-09 MED ORDER — HYDROXYZINE HCL 50 MG PO TABS: 50.0000 mg | ORAL_TABLET | Freq: Three times a day (TID) | ORAL | Status: DC | PRN

## 2013-11-09 NOTE — Assessment & Plan Note (Signed)
Deteriorated but she is declining to see ortho again- she is a poor surgical candidate and feels nothing else can be done. Given rx for neck brace to take to [facility].

## 2013-11-09 NOTE — Progress Notes (Signed)
68 yo with multiple complicated medical problems here for follow up.   Hypothyroidism- does complain of more fatigue.  Denies any other symptoms of hypo or hyperthyroidism. Lab Results  Component Value Date   TSH 2.99 03/11/2013   DM- Checks FSBS three times a day.  FSBS no higher than 200. Reasonable control on Glucotrol 5 mg daily.   Denies any episodes of hypoglycemia. Has lost some weight- she and her husband have been cutting back. Wt Readings from Last 3 Encounters:  11/09/13 208 lb 12 oz (94.688 kg)  07/14/13 218 lb 12 oz (99.224 kg)  06/18/13 221 lb (100.245 kg)     Lab Results  Component Value Date   HGBA1C 7.7* 07/20/2013   +urine microalbumin in 07/2013.  Chronic pain- managing ok, Oxycodone and nortriptyline have helped.  Taking care of her husband who is also disabled. Neck pain has been worse with right arm radiculopathy.  Has been told she needs neck surgery but she is not a good surgical candidate.  Asking for another neck brace- she has one that seems to help but it is old and falling apart.  Lab Results  Component Value Date   CHOL 185 07/20/2013   HDL 52.10 07/20/2013   LDLCALC 93 07/20/2013   LDLDIRECT 135.8 11/10/2009   TRIG 202.0* 07/20/2013   CHOLHDL 4 07/20/2013    Vaginal atrophy and hot flashes- insurance no longer covering her estrace.  I explained to her again the risks of taking hormones at her age, including stroke and other vascular/cardiac events and cancer.  She is aware of risks and willing to take them.   Patient Active Problem List   Diagnosis Date Noted  . Acute pharyngitis 07/14/2013  . Menopausal syndrome on hormone replacement therapy 07/14/2013  . Lipoma of back 03/11/2013  . Headache(784.0) 10/29/2012  . Rectal bleeding 10/29/2012  . Family conflict 24/26/8341  . Thrush 06/09/2012  . Chronic headaches 04/03/2012  . Hypothyroidism 09/18/2010  . Vitamin D deficiency 06/14/2010  . ECHOCARDIOGRAM, ABNORMAL 03/16/2010  . SINUSITIS,  CHRONIC 03/15/2010  . MYOCARDIAL INFARCTION, HX OF 02/09/2010  . LEFT BUNDLE BRANCH BLOCK 12/26/2009  . GERD 12/26/2009  . COPD 11/10/2009  . DIABETES MELLITUS, TYPE II 03/12/2006  . HYPERLIPIDEMIA 03/12/2006  . GOUT 03/12/2006  . PERIPHERAL NEUROPATHY 03/12/2006  . HYPERTENSION 03/12/2006  . ALLERGIC RHINITIS 03/12/2006   Past Medical History  Diagnosis Date  . Allergy   . Diabetes mellitus   . Gout   . Hyperlipidemia   . Hypertension   . COPD (chronic obstructive pulmonary disease)   . Peripheral neuropathy   . Low back pain    Past Surgical History  Procedure Laterality Date  . Polypectomy      Colon  . Abdominal hysterectomy    . Tubal ligation     History  Substance Use Topics  . Smoking status: Never Smoker   . Smokeless tobacco: Never Used  . Alcohol Use: No   Family History  Problem Relation Age of Onset  . Uterine cancer Mother    Allergies  Allergen Reactions  . Levofloxacin Hives   Current Outpatient Prescriptions on File Prior to Visit  Medication Sig Dispense Refill  . ACCU-CHEK FASTCLIX LANCETS MISC 1 Device by Does not apply route 3 (three) times daily. Use to check blood sugar up to three times a day  102 each  12  . albuterol (PROAIR HFA) 108 (90 BASE) MCG/ACT inhaler INHALE 2 PUFFS INTO THE LUNGS EVERY 4 HOURS  AS NEEDED  8.5 g  2  . Alum & Mag Hydroxide-Simeth (MAGIC MOUTHWASH W/LIDOCAINE) SOLN Swish spit 5 ml four times daily as needed.  240 mL  0  . diphenhydrAMINE (BENADRYL) 25 MG tablet Take 25 mg by mouth as needed.        Marland Kitchen estradiol (ESTRACE) 0.5 MG tablet Take 1 tablet (0.5 mg total) by mouth daily.  30 tablet  3  . fexofenadine (ALLEGRA) 180 MG tablet Take 180 mg by mouth as needed.        . furosemide (LASIX) 20 MG tablet Take 1 tablet (20 mg total) by mouth daily as needed.  30 tablet  4  . glipiZIDE (GLUCOTROL) 5 MG tablet TAKE ONE TABLET BY MOUTH EACH MORNING AND 1/2 TABLET EACH AFTERNOON  45 tablet  5  . hydrALAZINE (APRESOLINE) 10  MG tablet TAKE 1 TABLET BY MOUTH THREE TIMES A DAYFOR BLOOD PRESSURE  90 tablet  3  . hydrOXYzine (ATARAX/VISTARIL) 10 MG tablet Take 1 tablet (10 mg total) by mouth 3 (three) times daily as needed.  90 tablet  3  . levothyroxine (SYNTHROID, LEVOTHROID) 75 MCG tablet TAKE 1 TABLET BY MOUTH DAILY  30 tablet  11  . lovastatin (MEVACOR) 40 MG tablet TAKE ONE TABLET BY MOUTH EVERY NIGHT AT BEDTIME  30 tablet  11  . metoprolol (LOPRESSOR) 50 MG tablet Take 1 tablet (50 mg total) by mouth 2 (two) times daily. As needed for increased heart rate  60 tablet  5  . NEXIUM 40 MG capsule TAKE 1 TO 2 CAPSULES BY MOUTH ONCE DAILYAS DIRECTED  180 capsule  5  . nortriptyline (PAMELOR) 50 MG capsule TAKE ONE CAPSULE BY MOUTH EVERY NIGHT AT BEDTIME  30 capsule  0  . nystatin (MYCOSTATIN) 100000 UNIT/ML suspension TAKE 1 TEASPOONFUL (5ML) BY MOUTH 4 TIMES DAILY AS DIRECTED  60 mL  1  . nystatin cream (MYCOSTATIN) APPLY TO AFFECTED AREA(S) TWO TIMES PER DAY AS NEEDED  30 g  2  . Olopatadine HCl (PATADAY) 0.2 % SOLN Apply one drop to each eye daily  3 Bottle  1  . Oxycodone HCl 10 MG TABS Take 1 tablet (10 mg total) by mouth 2 (two) times daily as needed.  60 each  0  . Phenylephrine-Guaifenesin 30-400 MG CP12 1 tab every 12 hours as needed for cough/ congestion  14 each  0  . SPIRIVA HANDIHALER 18 MCG inhalation capsule PLACE 1 CAPSULE INTO INHALER AND INHALE ONCE DAILY AS DIRECTED  30 capsule  3  . tamsulosin (FLOMAX) 0.4 MG CAPS capsule Take 1 capsule (0.4 mg total) by mouth daily. 1 capsule by mouth daily until stone passes  30 capsule  3  . topiramate (TOPAMAX) 100 MG tablet TAKE THREE TABLETS BY MOUTH DAILY  90 tablet  1   No current facility-administered medications on file prior to visit.     The PMH, PSH, Social History, Family History, Medications, and allergies have been reviewed in [facility], and have been updated if relevant.  Review of Systems       See HPI  No difficulty swallowing No nausea or  vomiting No worsening CP No anxiety No worsening UE weakness + right upper extremity radiculopathy +HA- unchanged No LE edema +vaginal dryness +hot flashes  Physical Exam BP 148/78  Pulse 92  Temp(Src) 98.2 F (36.8 C) (Oral)  Wt 208 lb 12 oz (94.688 kg)  SpO2 97%  General:  alert and overweight-appearing, no acute distress.  Head:  normocephalic, atraumatic, and no abnormalities observed.   Mouth:  pharynx pink and moist.   Lungs:  Normal respiratory effort, chest expands symmetrically. Lungs are clear to auscultation, no crackles or wheezes. Heart:  Normal rate and regular rhythm. S1 and S2 normal without gallop, murmur, click, rub or other extra sounds. Abdomen:  Bowel sounds positive,abdomen soft and non-tender without masses, organomegaly or hernias noted. Msk:  gross deformity of left arm/hand. Decreased ROM of neck, particularly when turning to right Pulses:  R and L carotid,radial,femoral,dorsalis pedis and posterior tibial pulses are full and equal bilaterally Neurologic:  alert & oriented X3 and gait normal.   Grip strength on right seems unchanged- 3/5 Skin:  Intact without suspicious lesions or rashes

## 2013-11-09 NOTE — Addendum Note (Signed)
Addended by: Modena Nunnery on: 11/09/2013 03:21 PM   Modules accepted: Orders

## 2013-11-09 NOTE — Assessment & Plan Note (Signed)
Likely improved now with weight loss. Needs to watch for signs of hypoglycemia.  Recheck labs today. The patient indicates understanding of these issues and agrees with the plan.

## 2013-11-09 NOTE — Assessment & Plan Note (Signed)
Continue statin. 

## 2013-11-09 NOTE — Patient Instructions (Signed)
Great to see you.  We will call you with your lab results. 

## 2013-11-09 NOTE — Telephone Encounter (Signed)
Pt is in office for OV

## 2013-11-09 NOTE — Assessment & Plan Note (Signed)
She is aware of risks of HRT but feels the symptoms- vaginal atrophy and hot flashes- are not tolerable without HRT. Will try topical rx for estrace but I did tell her that this is still systemically absorbed into her body and comes with same risks as oral preparation. The patient indicates understanding of these issues and agrees with the plan.

## 2013-11-09 NOTE — Progress Notes (Signed)
Pre visit review using our clinic review tool, if applicable. No additional management support is needed unless otherwise documented below in the visit note. 

## 2013-12-07 ENCOUNTER — Other Ambulatory Visit: Payer: Self-pay | Admitting: Family Medicine

## 2013-12-22 DIAGNOSIS — H903 Sensorineural hearing loss, bilateral: Secondary | ICD-10-CM | POA: Diagnosis not present

## 2014-01-04 ENCOUNTER — Other Ambulatory Visit: Payer: Self-pay | Admitting: Family Medicine

## 2014-01-05 NOTE — Telephone Encounter (Signed)
Rx called in to requested pharmacy 

## 2014-01-05 NOTE — Telephone Encounter (Signed)
Pt requesting medication refill. Last f/u appt 11/2013. pls advise

## 2014-01-06 ENCOUNTER — Other Ambulatory Visit: Payer: Self-pay | Admitting: Family Medicine

## 2014-01-14 ENCOUNTER — Ambulatory Visit: Payer: Medicare Other | Admitting: Family Medicine

## 2014-01-18 ENCOUNTER — Encounter: Payer: Self-pay | Admitting: Family Medicine

## 2014-01-18 ENCOUNTER — Ambulatory Visit (INDEPENDENT_AMBULATORY_CARE_PROVIDER_SITE_OTHER): Payer: Medicare Other | Admitting: Family Medicine

## 2014-01-18 VITALS — BP 152/94 | HR 97 | Temp 98.1°F | Wt 204.5 lb

## 2014-01-18 DIAGNOSIS — J329 Chronic sinusitis, unspecified: Secondary | ICD-10-CM

## 2014-01-18 MED ORDER — AMOXICILLIN-POT CLAVULANATE 875-125 MG PO TABS: 1.0000 | ORAL_TABLET | Freq: Two times a day (BID) | ORAL | Status: DC

## 2014-01-18 NOTE — Progress Notes (Signed)
SUBJECTIVE:  Mariah Reed is a 68 y.o. female who complains of coryza, congestion, sneezing and right sinus pain for 10 days. She denies a history of anorexia, chest pain and shortness of breath and denies a history of asthma. Patient denies smoke cigarettes.   Patient Active Problem List   Diagnosis Date Noted  . Cervical disc disorder with radiculopathy of cervical region 11/09/2013  . Menopausal syndrome on hormone replacement therapy 07/14/2013  . Lipoma of back 03/11/2013  . Headache(784.0) 10/29/2012  . Rectal bleeding 10/29/2012  . Family conflict 56/21/3086  . Thrush 06/09/2012  . Chronic headaches 04/03/2012  . Hypothyroidism 09/18/2010  . Vitamin D deficiency 06/14/2010  . ECHOCARDIOGRAM, ABNORMAL 03/16/2010  . Sinusitis, chronic 03/15/2010  . MYOCARDIAL INFARCTION, HX OF 02/09/2010  . LEFT BUNDLE BRANCH BLOCK 12/26/2009  . GERD 12/26/2009  . COPD 11/10/2009  . Diabetes mellitus type 2 with complications 57/84/6962  . HLD (hyperlipidemia) 03/12/2006  . GOUT 03/12/2006  . PERIPHERAL NEUROPATHY 03/12/2006  . HYPERTENSION 03/12/2006  . ALLERGIC RHINITIS 03/12/2006   Past Medical History  Diagnosis Date  . Allergy   . Diabetes mellitus   . Gout   . Hyperlipidemia   . Hypertension   . COPD (chronic obstructive pulmonary disease)   . Peripheral neuropathy   . Low back pain    Past Surgical History  Procedure Laterality Date  . Polypectomy      Colon  . Abdominal hysterectomy    . Tubal ligation     History  Substance Use Topics  . Smoking status: Never Smoker   . Smokeless tobacco: Never Used  . Alcohol Use: No   Family History  Problem Relation Age of Onset  . Uterine cancer Mother    Allergies  Allergen Reactions  . Levofloxacin Hives   Current Outpatient Prescriptions on File Prior to Visit  Medication Sig Dispense Refill  . ACCU-CHEK FASTCLIX LANCETS MISC 1 Device by Does not apply route 3 (three) times daily. Use to check blood sugar up to  three times a day 102 each 12  . Alum & Mag Hydroxide-Simeth (MAGIC MOUTHWASH W/LIDOCAINE) SOLN Swish spit 5 ml four times daily as needed. 240 mL 0  . diphenhydrAMINE (BENADRYL) 25 MG tablet Take 25 mg by mouth as needed.      Marland Kitchen estradiol (ESTRACE) 0.1 MG/GM vaginal cream Place 1 Applicatorful vaginally 3 (three) times a week. 42.5 g 12  . fexofenadine (ALLEGRA) 180 MG tablet Take 180 mg by mouth as needed.      Marland Kitchen FLECTOR 1.3 % PTCH APPLY 1 PATCH ONTO SKIN AS DIRECTED 60 patch 3  . fluticasone (FLONASE) 50 MCG/ACT nasal spray 2 SPRAYS IN EACH NOSTRIL EVERY DAY 16 g 5  . furosemide (LASIX) 20 MG tablet Take 1 tablet (20 mg total) by mouth daily as needed. 30 tablet 4  . glipiZIDE (GLUCOTROL) 5 MG tablet TAKE 1 TABLET EVERY MORNING AND 1/2 TABLET IN THE AFTERNOON 45 tablet 2  . hydrALAZINE (APRESOLINE) 10 MG tablet TAKE 1 TABLET 3 TIMES A DAY FOR BLOOD PRESSURE 90 tablet 2  . hydrOXYzine (ATARAX/VISTARIL) 50 MG tablet Take 1 tablet (50 mg total) by mouth 3 (three) times daily as needed. 90 tablet 2  . levothyroxine (SYNTHROID, LEVOTHROID) 75 MCG tablet TAKE 1 TABLET BY MOUTH DAILY 30 tablet 11  . lovastatin (MEVACOR) 40 MG tablet TAKE ONE TABLET BY MOUTH EVERY NIGHT AT BEDTIME 30 tablet 11  . LYRICA 100 MG capsule TAKE 1 CAPSULE BY MOUTH  3 TIMES DAILY 90 capsule 3  . metoprolol (LOPRESSOR) 50 MG tablet TAKE 1 TABLET TWICE A DAY AS NEEDED FOR INCREASED HEART RATE 60 tablet 2  . NEXIUM 40 MG capsule TAKE 1 TO 2 CAPSULES BY MOUTH ONCE DAILYAS DIRECTED 180 capsule 5  . NITROSTAT 0.4 MG SL tablet DISSOLVE ONE TABLET UNDER THE TONGUE AS NEEDED FOR CHEST PAIN. MAY REPEAT AS INDICATED BY YOUR DOCTOR. 25 tablet 2  . nortriptyline (PAMELOR) 50 MG capsule TAKE ONE CAPSULE BY MOUTH EVERY NIGHT AT BEDTIME (Patient not taking: Reported on 01/18/2014) 30 capsule 0  . nystatin (MYCOSTATIN) 100000 UNIT/ML suspension ONE TEASPOONFUL BY MOUTH FOUR TIMES DAILY AS DIRECTED 60 mL 2  . nystatin cream (MYCOSTATIN) APPLY  TO AFFECTED AREA(S) TWO TIMES PER DAY AS NEEDED 30 g 2  . Oxycodone HCl 10 MG TABS Take 1 tablet (10 mg total) by mouth 2 (two) times daily as needed. 60 each 0  . PATADAY 0.2 % SOLN PLACE 1 DROP INTO BOTH EYES EVERY DAY 2.5 mL 1  . Phenylephrine-Guaifenesin 30-400 MG CP12 1 tab every 12 hours as needed for cough/ congestion 14 each 0  . PROAIR HFA 108 (90 BASE) MCG/ACT inhaler TAKE 2 PUFFS INTO LUNGS EVERY 4 HOURS ASNEEDED 8.5 g 2  . SPIRIVA HANDIHALER 18 MCG inhalation capsule PLACE 1 CAPSULE INTO INHALER AND INHALE ONCE DAILY 30 capsule 2  . tamsulosin (FLOMAX) 0.4 MG CAPS capsule Take 1 capsule (0.4 mg total) by mouth daily. 1 capsule by mouth daily until stone passes 30 capsule 3  . topiramate (TOPAMAX) 100 MG tablet TAKE THREE TABLETS EVERY DAY 90 tablet 2  . zolpidem (AMBIEN) 10 MG tablet TAKE ONE TABLET BY MOUTH AT BEDTIME 30 tablet 0   No current facility-administered medications on file prior to visit.   The PMH, PSH, Social History, Family History, Medications, and allergies have been reviewed in [facility], and have been updated if relevant.  OBJECTIVE: BP 152/94 mmHg  Pulse 97  Temp(Src) 98.1 F (36.7 C) (Oral)  Wt 204 lb 8 oz (92.761 kg)  SpO2 97%  She appears well, vital signs are as noted. Ears normal.  Throat and pharynx normal.  Neck supple. No adenopathy in the neck. Nose is congested. Sinuses tender. The chest is clear, without wheezes or rales.  ASSESSMENT:  sinusitis  PLAN: Given duration and progression of symptoms, will treat for bacterial sinusitis with Augmentin.  Symptomatic therapy suggested: push fluids, rest and return office visit prn if symptoms persist or worsen.Call or return to clinic prn if these symptoms worsen or fail to improve as anticipated.

## 2014-01-18 NOTE — Progress Notes (Signed)
Pre visit review using our clinic review tool, if applicable. No additional management support is needed unless otherwise documented below in the visit note. 

## 2014-02-03 ENCOUNTER — Other Ambulatory Visit: Payer: Self-pay | Admitting: Family Medicine

## 2014-02-03 ENCOUNTER — Other Ambulatory Visit: Payer: Self-pay | Admitting: *Deleted

## 2014-02-03 MED ORDER — ZOLPIDEM TARTRATE 10 MG PO TABS: 10.0000 mg | ORAL_TABLET | Freq: Every day | ORAL | Status: DC

## 2014-02-03 NOTE — Telephone Encounter (Signed)
Pt requesting medication refill. Last f/u appt 11/2013. pls advise

## 2014-02-03 NOTE — Telephone Encounter (Signed)
Rx called in to requested pharmacy 

## 2014-02-04 NOTE — Telephone Encounter (Signed)
Rx called in to requested pharmacy 

## 2014-03-01 ENCOUNTER — Other Ambulatory Visit: Payer: Self-pay | Admitting: Family Medicine

## 2014-03-02 NOTE — Telephone Encounter (Signed)
Received refill request electronically from pharmacy. Last office visit 01/18/14. Last refill on ambien 02/03/14 #30, last refill nystatin cream 02/03/14, last refill on lyrica 02/03/14 #90, last refill on nystatin suspension 01/05/14- 2 refills. Is it okay to refill medcation?

## 2014-03-02 NOTE — Telephone Encounter (Signed)
Written prescriptions called to pharmacy.

## 2014-03-04 ENCOUNTER — Other Ambulatory Visit: Payer: Self-pay | Admitting: Family Medicine

## 2014-03-04 ENCOUNTER — Telehealth: Payer: Self-pay | Admitting: Family Medicine

## 2014-03-04 NOTE — Telephone Encounter (Signed)
Patient called and wants to talk to [facility] about her medication.

## 2014-03-04 NOTE — Telephone Encounter (Signed)
Spoke to pt and informed her she must be seen to receive an additional abx. Appt sched for 2/1

## 2014-03-08 ENCOUNTER — Ambulatory Visit (INDEPENDENT_AMBULATORY_CARE_PROVIDER_SITE_OTHER): Payer: Medicare Other | Admitting: Family Medicine

## 2014-03-08 ENCOUNTER — Encounter: Payer: Self-pay | Admitting: Family Medicine

## 2014-03-08 VITALS — BP 126/68 | HR 100 | Temp 98.1°F | Wt 206.5 lb

## 2014-03-08 DIAGNOSIS — J329 Chronic sinusitis, unspecified: Secondary | ICD-10-CM

## 2014-03-08 MED ORDER — AMOXICILLIN-POT CLAVULANATE 875-125 MG PO TABS: 1.0000 | ORAL_TABLET | Freq: Two times a day (BID) | ORAL | Status: DC

## 2014-03-08 NOTE — Patient Instructions (Signed)
Good to see you. Please say hi to [facility] for me.  Please take Augmentin as directed- 1 tablet twice daily x 10 days.  Call us with an update.

## 2014-03-08 NOTE — Progress Notes (Signed)
Pre visit review using our clinic review tool, if applicable. No additional management support is needed unless otherwise documented below in the visit note. 

## 2014-03-08 NOTE — Progress Notes (Signed)
SUBJECTIVE:  Mariah Reed is a 69 y.o. female who complains of coryza, congestion, sneezing and right sinus pain for 8 days. She denies a history of anorexia, chest pain and shortness of breath and denies a history of asthma. Patient denies smoke cigarettes.   H/o recurrent sinusitis- also has a deviated septum-has declined sinus surgery and is too high risk for surgery.   Patient Active Problem List   Diagnosis Date Noted  . Cervical disc disorder with radiculopathy of cervical region 11/09/2013  . Menopausal syndrome on hormone replacement therapy 07/14/2013  . Lipoma of back 03/11/2013  . Headache(784.0) 10/29/2012  . Rectal bleeding 10/29/2012  . Family conflict 51/76/1607  . Thrush 06/09/2012  . Chronic headaches 04/03/2012  . Hypothyroidism 09/18/2010  . Vitamin D deficiency 06/14/2010  . ECHOCARDIOGRAM, ABNORMAL 03/16/2010  . Sinusitis, chronic 03/15/2010  . MYOCARDIAL INFARCTION, HX OF 02/09/2010  . LEFT BUNDLE BRANCH BLOCK 12/26/2009  . GERD 12/26/2009  . COPD 11/10/2009  . Diabetes mellitus type 2 with complications 37/11/6267  . HLD (hyperlipidemia) 03/12/2006  . GOUT 03/12/2006  . PERIPHERAL NEUROPATHY 03/12/2006  . HYPERTENSION 03/12/2006  . ALLERGIC RHINITIS 03/12/2006   Past Medical History  Diagnosis Date  . Allergy   . Diabetes mellitus   . Gout   . Hyperlipidemia   . Hypertension   . COPD (chronic obstructive pulmonary disease)   . Peripheral neuropathy   . Low back pain    Past Surgical History  Procedure Laterality Date  . Polypectomy      Colon  . Abdominal hysterectomy    . Tubal ligation     History  Substance Use Topics  . Smoking status: Never Smoker   . Smokeless tobacco: Never Used  . Alcohol Use: No   Family History  Problem Relation Age of Onset  . Uterine cancer Mother    Allergies  Allergen Reactions  . Levofloxacin Hives   Current Outpatient Prescriptions on File Prior to Visit  Medication Sig Dispense Refill  .  ACCU-CHEK FASTCLIX LANCETS MISC 1 Device by Does not apply route 3 (three) times daily. Use to check blood sugar up to three times a day 102 each 12  . Alum & Mag Hydroxide-Simeth (MAGIC MOUTHWASH W/LIDOCAINE) SOLN Swish spit 5 ml four times daily as needed. 240 mL 0  . diphenhydrAMINE (BENADRYL) 25 MG tablet Take 25 mg by mouth as needed.      . fexofenadine (ALLEGRA) 180 MG tablet Take 180 mg by mouth as needed.      Marland Kitchen FLECTOR 1.3 % PTCH APPLY ONE PATCH ONTO SKIN AS DIRECTED 60 patch 2  . fluticasone (FLONASE) 50 MCG/ACT nasal spray 2 SPRAYS IN EACH NOSTRIL EVERY DAY 16 g 5  . furosemide (LASIX) 20 MG tablet Take 1 tablet (20 mg total) by mouth daily as needed. 30 tablet 4  . glipiZIDE (GLUCOTROL) 5 MG tablet TAKE 1 TABLET EVERY MORNING AND 1/2 TABLET IN THE AFTERNOON 45 tablet 2  . hydrALAZINE (APRESOLINE) 10 MG tablet TAKE 1 TABLET 3 TIMES A DAY FOR BLOOD PRESSURE 90 tablet 5  . hydrOXYzine (ATARAX/VISTARIL) 50 MG tablet Take 1 tablet (50 mg total) by mouth 3 (three) times daily as needed. 90 tablet 2  . levothyroxine (SYNTHROID, LEVOTHROID) 75 MCG tablet TAKE 1 TABLET BY MOUTH DAILY 30 tablet 11  . lovastatin (MEVACOR) 40 MG tablet TAKE ONE TABLET BY MOUTH EVERY NIGHT AT BEDTIME 30 tablet 11  . LYRICA 100 MG capsule TAKE 1 CAPSULE BY MOUTH  3 TIMES DAILY 90 capsule 0  . metoprolol (LOPRESSOR) 50 MG tablet TAKE ONE TABLET TWICE DAILY AS NEEDED FOR INCREASED HEART RATE 60 tablet 6  . NEXIUM 40 MG capsule TAKE 1 TO 2 CAPSULES BY MOUTH ONCE DAILYAS DIRECTED 180 capsule 5  . NITROSTAT 0.4 MG SL tablet DISSOLVE ONE TABLET UNDER THE TONGUE AS NEEDED FOR CHEST PAIN. MAY REPEAT AS INDICATED BY YOUR DOCTOR. 25 tablet 2  . nortriptyline (PAMELOR) 50 MG capsule TAKE ONE CAPSULE BY MOUTH EVERY NIGHT AT BEDTIME 30 capsule 0  . nystatin (MYCOSTATIN) 100000 UNIT/ML suspension TAKE 1 TEASPOONFUL BY MOUTH 4 TIMES DAILY AS DIRECTED 60 mL 6  . nystatin cream (MYCOSTATIN) APPLY TO AFFECTED AREAS EVERY DAY AS NEEDED  30 g 6  . Oxycodone HCl 10 MG TABS Take 1 tablet (10 mg total) by mouth 2 (two) times daily as needed. 60 each 0  . PATADAY 0.2 % SOLN PLACE 1 DROP INTO BOTH EYES EVERY DAY 2.5 mL 2  . Phenylephrine-Guaifenesin 30-400 MG CP12 1 tab every 12 hours as needed for cough/ congestion 14 each 0  . PROAIR HFA 108 (90 BASE) MCG/ACT inhaler TAKE 2 PUFFS INTO LUNGS EVERY 4 HOURS ASNEEDED 8.5 g 2  . SPIRIVA HANDIHALER 18 MCG inhalation capsule PLACE 1 CAPSULE INTO INHALER AND INHALE ONCE DAILY 30 capsule 3  . topiramate (TOPAMAX) 100 MG tablet TAKE THREE TABLETS EVERY DAY 90 tablet 2  . zolpidem (AMBIEN) 10 MG tablet TAKE ONE TABLET BY MOUTH AT BEDTIME 30 tablet 0   No current facility-administered medications on file prior to visit.   The PMH, PSH, Social History, Family History, Medications, and allergies have been reviewed in [facility], and have been updated if relevant.  OBJECTIVE: BP 126/68 mmHg  Pulse 100  Temp(Src) 98.1 F (36.7 C) (Oral)  Wt 206 lb 8 oz (93.668 kg)  SpO2 97%  She appears well, vital signs are as noted. Ears normal.  Throat and pharynx normal.  Neck supple. No adenopathy in the neck. Nose is congested. Sinuses tender. The chest is clear, without wheezes or rales.  ASSESSMENT:  sinusitis  PLAN: Given duration and progression of symptoms, will treat for bacterial sinusitis with Augmentin.  Symptomatic therapy suggested: push fluids, rest and return office visit prn if symptoms persist or worsen.Call or return to clinic prn if these symptoms worsen or fail to improve as anticipated.

## 2014-03-15 ENCOUNTER — Ambulatory Visit (INDEPENDENT_AMBULATORY_CARE_PROVIDER_SITE_OTHER): Payer: Medicare Other | Admitting: Family Medicine

## 2014-03-15 ENCOUNTER — Encounter: Payer: Self-pay | Admitting: Family Medicine

## 2014-03-15 VITALS — BP 138/88 | HR 109 | Temp 97.6°F | Wt 208.5 lb

## 2014-03-15 DIAGNOSIS — Z23 Encounter for immunization: Secondary | ICD-10-CM | POA: Diagnosis not present

## 2014-03-15 DIAGNOSIS — E038 Other specified hypothyroidism: Secondary | ICD-10-CM

## 2014-03-15 DIAGNOSIS — I10 Essential (primary) hypertension: Secondary | ICD-10-CM

## 2014-03-15 DIAGNOSIS — M5010 Cervical disc disorder with radiculopathy, unspecified cervical region: Secondary | ICD-10-CM

## 2014-03-15 DIAGNOSIS — E118 Type 2 diabetes mellitus with unspecified complications: Secondary | ICD-10-CM

## 2014-03-15 DIAGNOSIS — E785 Hyperlipidemia, unspecified: Secondary | ICD-10-CM | POA: Diagnosis not present

## 2014-03-15 LAB — COMPREHENSIVE METABOLIC PANEL
ALT: 17 U/L (ref 0–35)
AST: 19 U/L (ref 0–37)
Albumin: 4.4 g/dL (ref 3.5–5.2)
Alkaline Phosphatase: 86 U/L (ref 39–117)
BUN: 11 mg/dL (ref 6–23)
CHLORIDE: 104 meq/L (ref 96–112)
CO2: 24 meq/L (ref 19–32)
Calcium: 9.5 mg/dL (ref 8.4–10.5)
Creatinine, Ser: 0.98 mg/dL (ref 0.40–1.20)
GFR: 59.72 mL/min — AB (ref >60.00)
Glucose, Bld: 208 mg/dL — ABNORMAL HIGH (ref 70–99)
POTASSIUM: 3.4 meq/L — AB (ref 3.5–5.1)
Sodium: 138 mEq/L (ref 135–145)
TOTAL PROTEIN: 7.7 g/dL (ref 6.0–8.3)
Total Bilirubin: 0.5 mg/dL (ref 0.2–1.2)

## 2014-03-15 LAB — LDL CHOLESTEROL, DIRECT: Direct LDL: 141 mg/dL

## 2014-03-15 LAB — HEMOGLOBIN A1C: HEMOGLOBIN A1C: 7.1 % — AB (ref 4.6–6.5)

## 2014-03-15 LAB — TSH: TSH: 1.41 u[IU]/mL (ref 0.35–4.50)

## 2014-03-15 MED ORDER — OXYCODONE HCL 10 MG PO TABS: 10.0000 mg | ORAL_TABLET | Freq: Two times a day (BID) | ORAL | Status: DC | PRN

## 2014-03-15 MED ORDER — TOPIRAMATE 100 MG PO TABS: ORAL_TABLET | ORAL | Status: DC

## 2014-03-15 NOTE — Assessment & Plan Note (Signed)
With pos urine micro. Will need to re address ACEI- has refused in past. Prevnar 13 given today. Recheck labs as well. On statin- LDL has been almost at goal. The patient indicates understanding of these issues and agrees with the plan.  Orders Placed This Encounter  Procedures  . Pneumococcal conjugate vaccine 13-valent IM  . LDL Cholesterol, Direct  . Hemoglobin A1c  . Comprehensive metabolic panel  . TSH

## 2014-03-15 NOTE — Progress Notes (Signed)
69 yo with multiple medical problems here for follow up.  Hypothyroidism- does complain of persistent fatigue.  Denies any other symptoms of hypo or hyperthyroidism. Lab Results  Component Value Date   TSH 2.99 03/11/2013    DM- CBGs stable.  Reasonable control on Glucotrol 5 mg daily.  CBGs no higher than 250.Marland Kitchen  Checks CBG 2-3 times daily.  Checks CBGs once daily.  Pos urine micro in 11/2013. Lab Results  Component Value Date   HGBA1C 7.1* 11/09/2013   Chronic pain- managing ok, Oxycodone and flector patches have helped.  She does want to restart taking nortriptlyline.  Taking care of her husband who is also disabled.  Lab Results  Component Value Date   CHOL 192 11/09/2013   HDL 48.80 11/09/2013   LDLCALC 109* 11/09/2013   LDLDIRECT 135.8 11/10/2009   TRIG 171.0* 11/09/2013   CHOLHDL 4 11/09/2013     Patient Active Problem List   Diagnosis Date Noted  . Cervical disc disorder with radiculopathy of cervical region 11/09/2013  . Menopausal syndrome on hormone replacement therapy 07/14/2013  . Lipoma of back 03/11/2013  . Headache(784.0) 10/29/2012  . Rectal bleeding 10/29/2012  . Family conflict 30/86/5784  . Thrush 06/09/2012  . Chronic headaches 04/03/2012  . Hypothyroidism 09/18/2010  . Vitamin D deficiency 06/14/2010  . ECHOCARDIOGRAM, ABNORMAL 03/16/2010  . Sinusitis, chronic 03/15/2010  . MYOCARDIAL INFARCTION, HX OF 02/09/2010  . LEFT BUNDLE BRANCH BLOCK 12/26/2009  . GERD 12/26/2009  . COPD 11/10/2009  . Diabetes mellitus type 2 with complications 69/62/9528  . HLD (hyperlipidemia) 03/12/2006  . GOUT 03/12/2006  . PERIPHERAL NEUROPATHY 03/12/2006  . HYPERTENSION 03/12/2006  . ALLERGIC RHINITIS 03/12/2006   Past Medical History  Diagnosis Date  . Allergy   . Diabetes mellitus   . Gout   . Hyperlipidemia   . Hypertension   . COPD (chronic obstructive pulmonary disease)   . Peripheral neuropathy   . Low back pain    Past Surgical History   Procedure Laterality Date  . Polypectomy      Colon  . Abdominal hysterectomy    . Tubal ligation     History  Substance Use Topics  . Smoking status: Never Smoker   . Smokeless tobacco: Never Used  . Alcohol Use: No   Family History  Problem Relation Age of Onset  . Uterine cancer Mother    Allergies  Allergen Reactions  . Levofloxacin Hives   Current Outpatient Prescriptions on File Prior to Visit  Medication Sig Dispense Refill  . ACCU-CHEK FASTCLIX LANCETS MISC 1 Device by Does not apply route 3 (three) times daily. Use to check blood sugar up to three times a day 102 each 12  . Alum & Mag Hydroxide-Simeth (MAGIC MOUTHWASH W/LIDOCAINE) SOLN Swish spit 5 ml four times daily as needed. 240 mL 0  . diphenhydrAMINE (BENADRYL) 25 MG tablet Take 25 mg by mouth as needed.      . fexofenadine (ALLEGRA) 180 MG tablet Take 180 mg by mouth as needed.      Marland Kitchen FLECTOR 1.3 % PTCH APPLY ONE PATCH ONTO SKIN AS DIRECTED 60 patch 2  . fluticasone (FLONASE) 50 MCG/ACT nasal spray 2 SPRAYS IN EACH NOSTRIL EVERY DAY 16 g 5  . furosemide (LASIX) 20 MG tablet Take 1 tablet (20 mg total) by mouth daily as needed. 30 tablet 4  . glipiZIDE (GLUCOTROL) 5 MG tablet TAKE 1 TABLET EVERY MORNING AND 1/2 TABLET IN THE AFTERNOON 45 tablet 2  .  hydrALAZINE (APRESOLINE) 10 MG tablet TAKE 1 TABLET 3 TIMES A DAY FOR BLOOD PRESSURE 90 tablet 5  . hydrOXYzine (ATARAX/VISTARIL) 50 MG tablet Take 1 tablet (50 mg total) by mouth 3 (three) times daily as needed. 90 tablet 2  . levothyroxine (SYNTHROID, LEVOTHROID) 75 MCG tablet TAKE 1 TABLET BY MOUTH DAILY 30 tablet 11  . lovastatin (MEVACOR) 40 MG tablet TAKE ONE TABLET BY MOUTH EVERY NIGHT AT BEDTIME 30 tablet 11  . LYRICA 100 MG capsule TAKE 1 CAPSULE BY MOUTH 3 TIMES DAILY 90 capsule 0  . metoprolol (LOPRESSOR) 50 MG tablet TAKE ONE TABLET TWICE DAILY AS NEEDED FOR INCREASED HEART RATE 60 tablet 6  . NEXIUM 40 MG capsule TAKE 1 TO 2 CAPSULES BY MOUTH ONCE DAILYAS  DIRECTED 180 capsule 5  . NITROSTAT 0.4 MG SL tablet DISSOLVE ONE TABLET UNDER THE TONGUE AS NEEDED FOR CHEST PAIN. MAY REPEAT AS INDICATED BY YOUR DOCTOR. 25 tablet 2  . nortriptyline (PAMELOR) 50 MG capsule TAKE ONE CAPSULE BY MOUTH EVERY NIGHT AT BEDTIME 30 capsule 0  . nystatin (MYCOSTATIN) 100000 UNIT/ML suspension TAKE 1 TEASPOONFUL BY MOUTH 4 TIMES DAILY AS DIRECTED 60 mL 6  . nystatin cream (MYCOSTATIN) APPLY TO AFFECTED AREAS EVERY DAY AS NEEDED 30 g 6  . Oxycodone HCl 10 MG TABS Take 1 tablet (10 mg total) by mouth 2 (two) times daily as needed. 60 each 0  . PATADAY 0.2 % SOLN PLACE 1 DROP INTO BOTH EYES EVERY DAY 2.5 mL 2  . Phenylephrine-Guaifenesin 30-400 MG CP12 1 tab every 12 hours as needed for cough/ congestion 14 each 0  . PROAIR HFA 108 (90 BASE) MCG/ACT inhaler TAKE 2 PUFFS INTO LUNGS EVERY 4 HOURS ASNEEDED 8.5 g 2  . SPIRIVA HANDIHALER 18 MCG inhalation capsule PLACE 1 CAPSULE INTO INHALER AND INHALE ONCE DAILY 30 capsule 3  . topiramate (TOPAMAX) 100 MG tablet TAKE THREE TABLETS EVERY DAY 90 tablet 2  . zolpidem (AMBIEN) 10 MG tablet TAKE ONE TABLET BY MOUTH AT BEDTIME 30 tablet 0   No current facility-administered medications on file prior to visit.     The PMH, PSH, Social History, Family History, Medications, and allergies have been reviewed in [facility], and have been updated if relevant.  Review of Systems       See HPI  No difficulty swallowing No nausea or vomiting No worsening CP Denies hypoglycemia No LE edema +intermittent HA No rashes No nausea or vomiting  Physical Exam BP 138/88 mmHg  Pulse 109  Temp(Src) 97.6 F (36.4 C) (Oral)  Wt 208 lb 8 oz (94.575 kg)  SpO2 96%  General:  alert and overweight-appearing, no acute distress.   Head:  normocephalic, atraumatic, and no abnormalities observed.   Mouth:  pharynx pink and moist.   Lungs:  Normal respiratory effort, chest expands symmetrically. Lungs are clear to auscultation, no crackles or  wheezes. Heart:  Normal rate and regular rhythm. S1 and S2 normal without gallop, murmur, click, rub or other extra sounds. Abdomen:  Bowel sounds positive,abdomen soft and non-tender without masses, organomegaly or hernias noted. Msk:  gross deformity of left arm/hand. Pulses:  R and L carotid,radial,femoral,dorsalis pedis and posterior tibial pulses are full and equal bilaterally Neurologic:  alert & oriented X3 and gait normal.   Skin:  Intact without suspicious lesions or rash

## 2014-03-15 NOTE — Progress Notes (Signed)
Pre visit review using our clinic review tool, if applicable. No additional management support is needed unless otherwise documented below in the visit note. 

## 2014-03-15 NOTE — Assessment & Plan Note (Signed)
Four rxs printed for oxycodone due to transportation issues- pharmacy aware- this has been our long term agreement.

## 2014-03-15 NOTE — Patient Instructions (Signed)
Good to see you. We are checking some labs today. You are receiving prevnar 13 today- pneumonia booster.

## 2014-03-19 ENCOUNTER — Telehealth: Payer: Self-pay | Admitting: Family Medicine

## 2014-03-19 NOTE — Telephone Encounter (Signed)
emmi mailed  °

## 2014-04-06 ENCOUNTER — Other Ambulatory Visit: Payer: Self-pay | Admitting: Family Medicine

## 2014-04-06 NOTE — Telephone Encounter (Signed)
Last f/u appt 02/2014 

## 2014-04-06 NOTE — Telephone Encounter (Signed)
Rx called in to requested pharmacy 

## 2014-05-05 ENCOUNTER — Other Ambulatory Visit: Payer: Self-pay | Admitting: Family Medicine

## 2014-05-06 ENCOUNTER — Telehealth: Payer: Self-pay | Admitting: Family Medicine

## 2014-05-06 ENCOUNTER — Other Ambulatory Visit: Payer: Self-pay | Admitting: Family Medicine

## 2014-05-06 MED ORDER — PREGABALIN 100 MG PO CAPS: 100.0000 mg | ORAL_CAPSULE | Freq: Three times a day (TID) | ORAL | Status: DC

## 2014-05-06 MED ORDER — ZOLPIDEM TARTRATE 10 MG PO TABS: 10.0000 mg | ORAL_TABLET | Freq: Every day | ORAL | Status: DC

## 2014-05-06 NOTE — Telephone Encounter (Signed)
Unfortunately, we were in clinic today and were unable to call it in as soon as the request was received. There is a 24-48hr turn around time on all medication refills. Spoke to pharmacy moments ago and called in Rx

## 2014-05-06 NOTE — Telephone Encounter (Signed)
Last f/u appt 03/2014 

## 2014-05-06 NOTE — Telephone Encounter (Signed)
Pt called in because she is upset that her prescriptions werent called in to her pharmacy. She refused to speak to the triage nurse and said that she needed this taken care of today.

## 2014-05-06 NOTE — Telephone Encounter (Signed)
Last OV was 03/15/14, medication last filled 04/06/2014. Please advise.

## 2014-05-06 NOTE — Telephone Encounter (Signed)
Rx called in to requested pharmacy 

## 2014-05-07 ENCOUNTER — Telehealth: Payer: Self-pay | Admitting: Family Medicine

## 2014-05-07 NOTE — Telephone Encounter (Signed)
Santiago Glad called from [place].  Prior Auth [id number] sent for pain medication and they cannot fill this medication rx until we call explaining previous pain management therapy.  The rep from [facility] was hard to understand, but it sounded like she said pain patches.  Please call to answer questions on the prior auth for patient. [phone]

## 2014-05-07 NOTE — Telephone Encounter (Signed)
ERROR

## 2014-05-10 NOTE — Telephone Encounter (Signed)
I just received this and I am out of the office today.  Please find out from patients what she has tried and failed (she was on these long before she established with me)and start on the paperwork.  Thank you.

## 2014-05-10 NOTE — Telephone Encounter (Signed)
Patient left a voicemail stating that she needs someone to call the insurance company for an override on her Nexium, Flector and Ambien. Patient stated that this needs to be done quickly. Patient stated that this has been going on for several weeks and she has talked with her insurance company and they told her that they are waiting on answers from her doctor.

## 2014-05-10 NOTE — Telephone Encounter (Signed)
Received fax from [place] that Berry, and Nexium were denied by insurance. Denial letters placed in your inbox.

## 2014-05-12 NOTE — Telephone Encounter (Signed)
Spoke to pt who states that her insurance company will not cover Ambien or Flector patches. She will pay for the ambien out of pocket and d/c flector patches as they are too expensive; removed from med list. Pt indicates her insurance will cover Nexium, generic only, once daily. Pt is requesting new Rx with new dosing instruction.

## 2014-05-13 MED ORDER — ESOMEPRAZOLE MAGNESIUM 40 MG PO CPDR: DELAYED_RELEASE_CAPSULE | ORAL | Status: DC

## 2014-05-13 NOTE — Telephone Encounter (Signed)
Rx resent electronically

## 2014-05-13 NOTE — Telephone Encounter (Signed)
Rx printed

## 2014-06-02 ENCOUNTER — Other Ambulatory Visit: Payer: Self-pay | Admitting: Family Medicine

## 2014-06-02 NOTE — Telephone Encounter (Signed)
Last f/u appt 03/2014 

## 2014-06-02 NOTE — Telephone Encounter (Signed)
Rx called in to requested pharmacy 

## 2014-06-04 NOTE — Telephone Encounter (Signed)
Opened in error

## 2014-06-30 ENCOUNTER — Other Ambulatory Visit: Payer: Self-pay | Admitting: Family Medicine

## 2014-06-30 NOTE — Telephone Encounter (Signed)
Rx called in to requested pharmacy 

## 2014-06-30 NOTE — Telephone Encounter (Signed)
Last f/u appt 03/2014 

## 2014-07-08 ENCOUNTER — Encounter: Payer: Self-pay | Admitting: Family Medicine

## 2014-07-08 ENCOUNTER — Telehealth: Payer: Self-pay | Admitting: Family Medicine

## 2014-07-08 ENCOUNTER — Telehealth: Payer: Self-pay | Admitting: *Deleted

## 2014-07-08 NOTE — Telephone Encounter (Signed)
This has been taken care of by [facility] :)  Thanks!!

## 2014-07-08 NOTE — Telephone Encounter (Signed)
Patient received a call to schedule a mammogram.  She was very upset because she said she discussed with Dr.Aron why she doesn't want to have a mammogram done. She wants to be taken off the list to be called.

## 2014-07-08 NOTE — Telephone Encounter (Signed)
Left message on machine to ask patient to return call to make sure her mammogram is scheduled.

## 2014-07-08 NOTE — Telephone Encounter (Signed)
I am not sure how to add her to a list like this.  Will forward to [place].

## 2014-07-14 ENCOUNTER — Ambulatory Visit (INDEPENDENT_AMBULATORY_CARE_PROVIDER_SITE_OTHER): Payer: Medicare Other | Admitting: Family Medicine

## 2014-07-14 ENCOUNTER — Encounter: Payer: Self-pay | Admitting: Family Medicine

## 2014-07-14 VITALS — BP 144/82 | HR 93 | Temp 97.5°F | Wt 216.2 lb

## 2014-07-14 DIAGNOSIS — E118 Type 2 diabetes mellitus with unspecified complications: Secondary | ICD-10-CM | POA: Diagnosis not present

## 2014-07-14 DIAGNOSIS — E038 Other specified hypothyroidism: Secondary | ICD-10-CM | POA: Diagnosis not present

## 2014-07-14 DIAGNOSIS — G609 Hereditary and idiopathic neuropathy, unspecified: Secondary | ICD-10-CM | POA: Diagnosis not present

## 2014-07-14 DIAGNOSIS — M5010 Cervical disc disorder with radiculopathy, unspecified cervical region: Secondary | ICD-10-CM

## 2014-07-14 DIAGNOSIS — I10 Essential (primary) hypertension: Secondary | ICD-10-CM

## 2014-07-14 DIAGNOSIS — E785 Hyperlipidemia, unspecified: Secondary | ICD-10-CM

## 2014-07-14 LAB — LIPID PANEL
Cholesterol: 183 mg/dL (ref 0–200)
HDL: 49.8 mg/dL (ref >39.00)
LDL CALC: 112 mg/dL — AB (ref 0–99)
NONHDL: 133.2
TRIGLYCERIDES: 108 mg/dL (ref 0.0–149.0)
Total CHOL/HDL Ratio: 4
VLDL: 21.6 mg/dL (ref 0.0–40.0)

## 2014-07-14 LAB — COMPREHENSIVE METABOLIC PANEL
ALBUMIN: 4.5 g/dL (ref 3.5–5.2)
ALK PHOS: 80 U/L (ref 39–117)
ALT: 16 U/L (ref 0–35)
AST: 19 U/L (ref 0–37)
BILIRUBIN TOTAL: 0.3 mg/dL (ref 0.2–1.2)
BUN: 8 mg/dL (ref 6–23)
CALCIUM: 9.7 mg/dL (ref 8.4–10.5)
CO2: 29 mEq/L (ref 19–32)
CREATININE: 0.85 mg/dL (ref 0.40–1.20)
Chloride: 105 mEq/L (ref 96–112)
GFR: 70.31 mL/min (ref >60.00)
Glucose, Bld: 95 mg/dL (ref 70–99)
POTASSIUM: 4.1 meq/L (ref 3.5–5.1)
Sodium: 138 mEq/L (ref 135–145)
TOTAL PROTEIN: 7.8 g/dL (ref 6.0–8.3)

## 2014-07-14 LAB — HEMOGLOBIN A1C: HEMOGLOBIN A1C: 6.9 % — AB (ref 4.6–6.5)

## 2014-07-14 LAB — T4, FREE: Free T4: 0.77 ng/dL (ref 0.60–1.60)

## 2014-07-14 MED ORDER — OXYCODONE HCL 10 MG PO TABS: 10.0000 mg | ORAL_TABLET | Freq: Two times a day (BID) | ORAL | Status: DC | PRN

## 2014-07-14 NOTE — Progress Notes (Signed)
69 yo with multiple medical problems here for follow up.  Hypothyroidism-was feeling "sick."  She is not able to verbalize how but did say that when she cut her synthroid dose in half she felt better.  She has continued to cut her  Synthroid 75 mcg tablet in half. Lab Results  Component Value Date   TSH 1.41 03/15/2014    DM- FSBS stable.  Reasonable control on Glucotrol 5 mg daily.  CBGs no higher than 250.Marland Kitchen  Checks CBG 2-3 times daily.  Checks CBGs once daily.  Pos urine micro in 11/2013. Prevnar 13 03/15/14 Lab Results  Component Value Date   HGBA1C 7.1* 03/15/2014   Chronic pain- managing ok, Oxycodone and flector patches have helped.  Neck pain is "flaring up" again.  Wearing her soft neck collar seems to help- has refused any further work up or treatment.  Lab Results  Component Value Date   CHOL 192 11/09/2013   HDL 48.80 11/09/2013   LDLCALC 109* 11/09/2013   LDLDIRECT 141.0 03/15/2014   TRIG 171.0* 11/09/2013   CHOLHDL 4 11/09/2013     Patient Active Problem List   Diagnosis Date Noted  . Cervical disc disorder with radiculopathy of cervical region 11/09/2013  . Menopausal syndrome on hormone replacement therapy 07/14/2013  . Lipoma of back 03/11/2013  . Headache(784.0) 10/29/2012  . Rectal bleeding 10/29/2012  . Family conflict 44/31/5400  . Thrush 06/09/2012  . Chronic headaches 04/03/2012  . Hypothyroidism 09/18/2010  . Vitamin D deficiency 06/14/2010  . ECHOCARDIOGRAM, ABNORMAL 03/16/2010  . Sinusitis, chronic 03/15/2010  . MYOCARDIAL INFARCTION, HX OF 02/09/2010  . LEFT BUNDLE BRANCH BLOCK 12/26/2009  . GERD 12/26/2009  . COPD 11/10/2009  . Diabetes mellitus type 2 with complications 86/76/1950  . HLD (hyperlipidemia) 03/12/2006  . GOUT 03/12/2006  . PERIPHERAL NEUROPATHY 03/12/2006  . Essential hypertension 03/12/2006  . ALLERGIC RHINITIS 03/12/2006   Past Medical History  Diagnosis Date  . Allergy   . Diabetes mellitus   . Gout   .  Hyperlipidemia   . Hypertension   . COPD (chronic obstructive pulmonary disease)   . Peripheral neuropathy   . Low back pain    Past Surgical History  Procedure Laterality Date  . Polypectomy      Colon  . Abdominal hysterectomy    . Tubal ligation     History  Substance Use Topics  . Smoking status: Never Smoker   . Smokeless tobacco: Never Used  . Alcohol Use: No   Family History  Problem Relation Age of Onset  . Uterine cancer Mother    Allergies  Allergen Reactions  . Levofloxacin Hives   Current Outpatient Prescriptions on File Prior to Visit  Medication Sig Dispense Refill  . ACCU-CHEK FASTCLIX LANCETS MISC 1 Device by Does not apply route 3 (three) times daily. Use to check blood sugar up to three times a day 102 each 12  . Alum & Mag Hydroxide-Simeth (MAGIC MOUTHWASH W/LIDOCAINE) SOLN Swish spit 5 ml four times daily as needed. 240 mL 0  . diphenhydrAMINE (BENADRYL) 25 MG tablet Take 25 mg by mouth as needed.      Marland Kitchen esomeprazole (NEXIUM) 40 MG capsule 1 tab by mouth daily 90 capsule 3  . fexofenadine (ALLEGRA) 180 MG tablet Take 180 mg by mouth as needed.      . fluticasone (FLONASE) 50 MCG/ACT nasal spray TAKE 2 SPRAYS IN EACH NOSTRIL EVERY DAY 16 g 5  . furosemide (LASIX) 20 MG tablet Take 1  tablet (20 mg total) by mouth daily as needed. 30 tablet 4  . glipiZIDE (GLUCOTROL) 5 MG tablet TAKE 1 TABLET EVERY MORNING AND 1/2 TABLET EVERY AFTERNOON 45 tablet 5  . hydrALAZINE (APRESOLINE) 10 MG tablet TAKE 1 TABLET 3 TIMES A DAY FOR BLOOD PRESSURE 90 tablet 5  . hydrOXYzine (ATARAX/VISTARIL) 50 MG tablet Take 1 tablet (50 mg total) by mouth 3 (three) times daily as needed. 90 tablet 2  . levothyroxine (SYNTHROID, LEVOTHROID) 75 MCG tablet TAKE 1 TABLET BY MOUTH DAILY 30 tablet 11  . lovastatin (MEVACOR) 40 MG tablet TAKE ONE TABLET BY MOUTH EVERY NIGHT AT BEDTIME 30 tablet 11  . LYRICA 100 MG capsule TAKE 1 CAPSULE BY MOUTH 3 TIMES DAILY 90 capsule 0  . metoprolol  (LOPRESSOR) 50 MG tablet TAKE ONE TABLET TWICE DAILY AS NEEDED FOR INCREASED HEART RATE 60 tablet 6  . NITROSTAT 0.4 MG SL tablet DISSOLVE 1 TAB UNDER TONGUE AS NEEDED FOR CHEST PAIN MAY REPEAT AS INDICATED 25 tablet 5  . nortriptyline (PAMELOR) 50 MG capsule TAKE 1 CAPSULE AT BEDTIME 30 capsule 5  . nystatin (MYCOSTATIN) 100000 UNIT/ML suspension TAKE 1 TEASPOONFUL BY MOUTH 4 TIMES DAILY AS DIRECTED 60 mL 6  . nystatin cream (MYCOSTATIN) APPLY TO AFFECTED AREAS EVERY DAY AS NEEDED 30 g 6  . Oxycodone HCl 10 MG TABS Take 1 tablet (10 mg total) by mouth 2 (two) times daily as needed. 60 each 0  . PATADAY 0.2 % SOLN PLACE 1 DROP INTO BOTH EYES EVERY DAY 2.5 mL 2  . PROAIR HFA 108 (90 BASE) MCG/ACT inhaler TAKE 2 PUFFS INTO LUNGS EVERY 4 HOURS ASNEEDED 8.5 g 5  . SPIRIVA HANDIHALER 18 MCG inhalation capsule INHALE 1 CAPSULE AS DIRECTED ONCE A DAY 30 capsule 5  . topiramate (TOPAMAX) 100 MG tablet TAKE THREE TABLETS EVERY DAY 90 tablet 3  . zolpidem (AMBIEN) 10 MG tablet TAKE ONE TABLET BY MOUTH AT BEDTIME 30 tablet 0   No current facility-administered medications on file prior to visit.     The PMH, PSH, Social History, Family History, Medications, and allergies have been reviewed in [facility], and have been updated if relevant.  Review of Systems    Review of Systems  Constitutional: Positive for fatigue.  HENT: Negative.   Eyes: Negative.   Respiratory: Negative.   Cardiovascular: Negative.   Gastrointestinal: Negative.   Endocrine: Negative.   Musculoskeletal: Negative.   Skin: Negative.   Allergic/Immunologic: Negative.   Neurological: Negative.   Hematological: Negative.   Psychiatric/Behavioral: Negative.   All other systems reviewed and are negative.    Physical Exam BP 144/82 mmHg  Pulse 93  Temp(Src) 97.5 F (36.4 C) (Oral)  Wt 216 lb 4 oz (98.09 kg)  SpO2 96%  Physical Exam  Constitutional: She is oriented to person, place, and time. She appears well-developed and  well-nourished. No distress.  HENT:  Head: Normocephalic.  Eyes: Conjunctivae and EOM are normal.  Neck:  Wearing her neck brace today  Cardiovascular: Normal rate and regular rhythm.   Pulmonary/Chest: Effort normal.  Neurological: She is alert and oriented to person, place, and time. No cranial nerve deficit.  Skin: Skin is warm and dry.  Psychiatric: She has a normal mood and affect. Her behavior is normal. Judgment and thought content normal.  Nursing note and vitals reviewed.

## 2014-07-14 NOTE — Patient Instructions (Signed)
Good to see you. We will call you with your lab results.   

## 2014-07-14 NOTE — Assessment & Plan Note (Signed)
Reasonable control. Pneumococcal vaccinations UTD. Continue current rxs. Recheck a1c today.

## 2014-07-14 NOTE — Progress Notes (Signed)
Pre visit review using our clinic review tool, if applicable. No additional management support is needed unless otherwise documented below in the visit note. 

## 2014-07-14 NOTE — Assessment & Plan Note (Signed)
She has adjusted her own rx. Will recheck TSH and Ft4 before sending in new rx. The patient indicates understanding of these issues and agrees with the plan.

## 2014-07-14 NOTE — Assessment & Plan Note (Signed)
No changes made

## 2014-07-14 NOTE — Assessment & Plan Note (Signed)
Reasonable control. No changes made today. 

## 2014-07-15 ENCOUNTER — Encounter: Payer: Self-pay | Admitting: *Deleted

## 2014-07-28 ENCOUNTER — Other Ambulatory Visit: Payer: Self-pay | Admitting: Family Medicine

## 2014-07-28 NOTE — Telephone Encounter (Signed)
Rx called in to requested pharmacy 

## 2014-07-28 NOTE — Telephone Encounter (Signed)
Last f/u appt 07/2014 

## 2014-08-12 ENCOUNTER — Ambulatory Visit: Payer: Medicare Other | Admitting: Family Medicine

## 2014-08-24 ENCOUNTER — Other Ambulatory Visit: Payer: Self-pay | Admitting: Family Medicine

## 2014-08-24 NOTE — Telephone Encounter (Signed)
Last f/u appt 05/2014 

## 2014-08-25 NOTE — Telephone Encounter (Signed)
rx called in to requested pharmacy 

## 2014-09-08 ENCOUNTER — Telehealth: Payer: Self-pay | Admitting: Family Medicine

## 2014-09-08 NOTE — Telephone Encounter (Signed)
Patient Name: Mariah Reed DOB: 1945/02/21 Initial Comment Caller has a question regarding a mole. Nurse Assessment Nurse: Ronnald Ramp, RN, Miranda Date/Time (Eastern Time): 09/08/2014 9:17:29 AM Confirm and document reason for call. If symptomatic, describe symptoms. ---Caller states she has an appt on Aug 15th to have a mole removed. She has difficulty with mobility and has to roll off the bed. She has a mole on her right shoulder/upper back and when she is getting up she rubs it. The mole has been partially torn off. She has heard that there can be uncontrolled bleeding if moles get ripped off and she wants to know if that is true. Has the patient traveled out of the country within the last 30 days? ---Not Applicable Does the patient require triage? ---Yes Related visit to physician within the last 2 weeks? ---No Does the PT have any chronic conditions? (i.e. diabetes, asthma, etc.) ---Yes List chronic conditions. ---Back pain, diabetes, Gout, Allergies, COPD, GERD Guidelines Guideline Title Affirmed Question Affirmed Notes Skin Lesion - Moles or Growths [1] Skin growth or mole AND [2] sticks up out of the skin (elevated) AND [3] feels rough to the touch Final Disposition User See PCP within 2 Ronny Flurry, RN, Miranda Disagree/Comply: Leta Baptist

## 2014-09-08 NOTE — Telephone Encounter (Signed)
Pt has 30 min appt on 09/20/14 at 12 noon with Dr Deborra Medina.

## 2014-09-20 ENCOUNTER — Encounter: Payer: Self-pay | Admitting: Family Medicine

## 2014-09-20 ENCOUNTER — Ambulatory Visit (INDEPENDENT_AMBULATORY_CARE_PROVIDER_SITE_OTHER): Payer: Medicare Other | Admitting: Family Medicine

## 2014-09-20 VITALS — BP 158/84 | HR 78 | Temp 98.0°F | Wt 211.2 lb

## 2014-09-20 DIAGNOSIS — J329 Chronic sinusitis, unspecified: Secondary | ICD-10-CM | POA: Diagnosis not present

## 2014-09-20 DIAGNOSIS — Z639 Problem related to primary support group, unspecified: Secondary | ICD-10-CM | POA: Diagnosis not present

## 2014-09-20 DIAGNOSIS — Z638 Other specified problems related to primary support group: Secondary | ICD-10-CM

## 2014-09-20 MED ORDER — ESTROGENS CONJUGATED 0.625 MG PO TABS: 0.6250 mg | ORAL_TABLET | Freq: Every day | ORAL | Status: DC

## 2014-09-20 MED ORDER — AMOXICILLIN-POT CLAVULANATE 875-125 MG PO TABS: 1.0000 | ORAL_TABLET | Freq: Two times a day (BID) | ORAL | Status: DC

## 2014-09-20 NOTE — Assessment & Plan Note (Signed)
Deteriorated. Given duration and progression of symptoms, will treat for bacterial sinusitis with Augmentin.

## 2014-09-20 NOTE — Progress Notes (Signed)
Subjective:   Patient ID: Mariah Reed, female    DOB: 12-17-1945, 69 y.o.   MRN: [id number]  Mariah Reed is a pleasant 69 y.o. year old female who presents to clinic today with mole removal and congestion  on 09/20/2014  HPI:  Initially here to have mole removed but decided that she does not want to address this today.  Instead she would like to talk about her family conflict and her recurrent sinusitis.  Recurrent sinusitis-  Chronic intermittent issue.  Has been to multiple ENTs.  Also has a deviated septum-has declined sinus surgery and is too high risk for surgery.  Past two weeks, increased sinus pressure, tooth pain.  Having chills.  Not sure if she has had a fever.  No CP or SOB.  No cough.  Concerned because her husband had a DME form filled out by our office and someone checked the box that he is single even though they have been married for years.  She is concerned that I was "trying to tell him something."  They have not been arguing and have had no other marital issues recently.  Current Outpatient Prescriptions on File Prior to Visit  Medication Sig Dispense Refill  . ACCU-CHEK FASTCLIX LANCETS MISC 1 Device by Does not apply route 3 (three) times daily. Use to check blood sugar up to three times a day 102 each 12  . Alum & Mag Hydroxide-Simeth (MAGIC MOUTHWASH W/LIDOCAINE) SOLN Swish spit 5 ml four times daily as needed. 240 mL 0  . diphenhydrAMINE (BENADRYL) 25 MG tablet Take 25 mg by mouth as needed.      Marland Kitchen esomeprazole (NEXIUM) 40 MG capsule 1 tab by mouth daily 90 capsule 3  . fexofenadine (ALLEGRA) 180 MG tablet Take 180 mg by mouth as needed.      . fluticasone (FLONASE) 50 MCG/ACT nasal spray TAKE 2 SPRAYS IN EACH NOSTRIL EVERY DAY 16 g 5  . furosemide (LASIX) 20 MG tablet Take 1 tablet (20 mg total) by mouth daily as needed. 30 tablet 4  . glipiZIDE (GLUCOTROL) 5 MG tablet TAKE 1 TABLET EVERY MORNING AND 1/2 TABLET EVERY AFTERNOON 45 tablet 5  .  hydrALAZINE (APRESOLINE) 10 MG tablet TAKE 1 TABLET 3 TIMES A DAY FOR BLOOD PRESSURE 90 tablet 5  . hydrOXYzine (ATARAX/VISTARIL) 50 MG tablet Take 1 tablet (50 mg total) by mouth 3 (three) times daily as needed. 90 tablet 2  . levothyroxine (SYNTHROID, LEVOTHROID) 75 MCG tablet TAKE ONE TABLET EVERY DAY 30 tablet 11  . lovastatin (MEVACOR) 40 MG tablet TAKE ONE TABLET AT BEDTIME 30 tablet 0  . LYRICA 100 MG capsule TAKE 1 CAPSULE BY MOUTH 3 TIMES DAILY 90 capsule 0  . metoprolol (LOPRESSOR) 50 MG tablet TAKE ONE TABLET TWICE DAILY AS NEEDED FOR INCREASED HEART RATE 60 tablet 6  . NITROSTAT 0.4 MG SL tablet DISSOLVE 1 TAB UNDER TONGUE AS NEEDED FOR CHEST PAIN MAY REPEAT AS INDICATED 25 tablet 5  . nortriptyline (PAMELOR) 50 MG capsule TAKE 1 CAPSULE AT BEDTIME 30 capsule 5  . nystatin (MYCOSTATIN) 100000 UNIT/ML suspension TAKE 1 TEASPOONFUL BY MOUTH 4 TIMES DAILY AS DIRECTED 60 mL 6  . nystatin cream (MYCOSTATIN) APPLY TO AFFECTED AREAS EVERY DAY AS NEEDED 30 g 6  . Oxycodone HCl 10 MG TABS Take 1 tablet (10 mg total) by mouth 2 (two) times daily as needed. 60 each 0  . Oxycodone HCl 10 MG TABS Take 1 tablet (10 mg total)  by mouth 2 (two) times daily as needed. 60 each 0  . PATADAY 0.2 % SOLN PLACE 1 DROP INTO BOTH EYES EVERY DAY 2.5 mL 2  . PROAIR HFA 108 (90 BASE) MCG/ACT inhaler TAKE 2 PUFFS INTO LUNGS EVERY 4 HOURS ASNEEDED 8.5 g 5  . SPIRIVA HANDIHALER 18 MCG inhalation capsule INHALE 1 CAPSULE AS DIRECTED ONCE A DAY 30 capsule 5  . topiramate (TOPAMAX) 100 MG tablet TAKE THREE TABLETS EVERY DAY 90 tablet 3  . zolpidem (AMBIEN) 10 MG tablet TAKE ONE TABLET AT BEDTIME 30 tablet 0   No current facility-administered medications on file prior to visit.    Allergies  Allergen Reactions  . Levofloxacin Hives    Past Medical History  Diagnosis Date  . Allergy   . Diabetes mellitus   . Gout   . Hyperlipidemia   . Hypertension   . COPD (chronic obstructive pulmonary disease)   .  Peripheral neuropathy   . Low back pain     Past Surgical History  Procedure Laterality Date  . Polypectomy      Colon  . Abdominal hysterectomy    . Tubal ligation      Family History  Problem Relation Age of Onset  . Uterine cancer Mother     Social History   Social History  . Marital Status: Legally Separated    Spouse Name: Mariah Reed  . Number of Children: N/A  . Years of Education: N/A   Occupational History  .     Social History Main Topics  . Smoking status: Never Smoker   . Smokeless tobacco: Never Used  . Alcohol Use: No  . Drug Use: No  . Sexual Activity: Not on file   Other Topics Concern  . Not on file   Social History Narrative   Pt would like [name] at [phone] (nephew) to be called in case of emergency and ask him to call her daughter Mariah Reed at [phone].   The PMH, PSH, Social History, Family History, Medications, and allergies have been reviewed in [facility], and have been updated if relevant.   Review of Systems  HENT: Positive for congestion, postnasal drip, rhinorrhea, sinus pressure and sore throat.   Respiratory: Negative.   Cardiovascular: Negative.   Musculoskeletal: Negative.   Skin: Negative.   Hematological: Negative.   Psychiatric/Behavioral: Negative.   All other systems reviewed and are negative.      Objective:    BP 158/84 mmHg  Pulse 78  Temp(Src) 98 F (36.7 C) (Oral)  Wt 211 lb 4 oz (95.822 kg)  SpO2 97%   Physical Exam  Constitutional: She is oriented to person, place, and time. She appears well-developed and well-nourished. No distress.  HENT:  Head: Normocephalic.  Right Ear: Hearing and tympanic membrane normal.  Left Ear: Hearing and tympanic membrane normal.  Nose: Right sinus exhibits frontal sinus tenderness. Left sinus exhibits frontal sinus tenderness.  Mouth/Throat: Posterior oropharyngeal erythema present. No oropharyngeal exudate, posterior oropharyngeal edema or tonsillar  abscesses.  Eyes: Conjunctivae are normal.  Cardiovascular: Normal rate.   Pulmonary/Chest: Effort normal and breath sounds normal. No respiratory distress. She has no wheezes.  Neurological: She is alert and oriented to person, place, and time. No cranial nerve deficit.  Skin: Skin is warm and dry.  Psychiatric: She has a normal mood and affect. Her behavior is normal. Judgment and thought content normal.  Nursing note and vitals reviewed.         Assessment &  Plan:   Chronic sinusitis, unspecified location No Follow-up on file.

## 2014-09-20 NOTE — Progress Notes (Signed)
Pre visit review using our clinic review tool, if applicable. No additional management support is needed unless otherwise documented below in the visit note. 

## 2014-09-20 NOTE — Assessment & Plan Note (Signed)
>  25 minutes spent in face to face time with patient, >50% spent in counselling or coordination of care Advised her that if we checked the box for single, it was unintentional although I do not have any recollection of checking the box.  Reassurance provided and advised pt to speak with her husband about this. The patient indicates understanding of these issues and agrees with the plan.

## 2014-09-21 ENCOUNTER — Telehealth: Payer: Self-pay | Admitting: *Deleted

## 2014-09-21 NOTE — Telephone Encounter (Signed)
PA completed online for premarin via covermymeds. Advised pt response received denying medication due to age. Advised pt to contact her insurance company and confirm which like meds are covered and contact office back for new Rx. Pt was also advised, per Dr Deborra Medina at [facility], of possibility of medication not being covered.

## 2014-09-22 ENCOUNTER — Other Ambulatory Visit: Payer: Self-pay

## 2014-09-22 ENCOUNTER — Other Ambulatory Visit: Payer: Self-pay | Admitting: Family Medicine

## 2014-09-22 NOTE — Telephone Encounter (Signed)
Mariah Reed with [facility] left v/m requesting refill ambien. Pt last seen 07/14/14 for f/u and rx last refilled #30 on 08/24/14.Please advise.

## 2014-09-23 ENCOUNTER — Other Ambulatory Visit: Payer: Self-pay

## 2014-09-23 MED ORDER — ZOLPIDEM TARTRATE 10 MG PO TABS: 10.0000 mg | ORAL_TABLET | Freq: Every day | ORAL | Status: DC

## 2014-09-23 MED ORDER — PREGABALIN 100 MG PO CAPS: 100.0000 mg | ORAL_CAPSULE | Freq: Three times a day (TID) | ORAL | Status: DC

## 2014-09-23 NOTE — Telephone Encounter (Signed)
Pt left v/m requesting refill Lyrica to [facility] pharmacy. rx last printed # 90 on 08/24/14 and pt last seen f/u 07/14/14.Please advise.

## 2014-09-23 NOTE — Telephone Encounter (Signed)
Rx called in to requested pharmacy 

## 2014-09-24 NOTE — Telephone Encounter (Signed)
Rx called in to requested pharmacy 

## 2014-10-20 ENCOUNTER — Other Ambulatory Visit: Payer: Self-pay | Admitting: Family Medicine

## 2014-10-26 ENCOUNTER — Other Ambulatory Visit: Payer: Self-pay

## 2014-10-26 MED ORDER — ZOLPIDEM TARTRATE 10 MG PO TABS: 10.0000 mg | ORAL_TABLET | Freq: Every day | ORAL | Status: DC

## 2014-10-26 MED ORDER — PREGABALIN 100 MG PO CAPS: 100.0000 mg | ORAL_CAPSULE | Freq: Three times a day (TID) | ORAL | Status: DC

## 2014-10-26 NOTE — Telephone Encounter (Signed)
Pt request status of refill for lyrica and ambien; advised pt rx faxed to [facility] today 1:35 pm. Pt will ck with pharmacy.

## 2014-10-26 NOTE — Telephone Encounter (Signed)
Rx faxed to pharmacy  

## 2014-10-26 NOTE — Telephone Encounter (Signed)
[facility] left v/m requesting refills on lyrica(last refilled # 90 on 09/23/14) and ambien(last refilled # 30 on 09/23/14. Last seen 07/14/14.Please advise.

## 2014-11-02 ENCOUNTER — Telehealth: Payer: Self-pay | Admitting: Family Medicine

## 2014-11-02 NOTE — Telephone Encounter (Signed)
Spoke to patient to follow up.  She states pain has improved with oxycodone, but not resolved.  Swelling improved with ice/cold therapy.  Per [facility], OV recommended within 4 hours or ED/UC visit.  Patient refuses.  She states she has a follow up on 10/4 and prefers to wait to be seen at that time.  Are you okay with this?  Patient agrees to call 911 if symptoms worsen.

## 2014-11-02 NOTE — Telephone Encounter (Signed)
Lm on pts vm and advised per Dr Aron. Pt advised to contact office should she have additional questions 

## 2014-11-02 NOTE — Telephone Encounter (Signed)
Patient called back to confirm message.  Advised okay to wait until OV on 10/4 to evaluate symptoms.  ED or 911 should symptoms worsen.

## 2014-11-02 NOTE — Telephone Encounter (Signed)
Yes ok since she agrees to ED or 911 if symptoms worsen.

## 2014-11-02 NOTE — Telephone Encounter (Signed)
[facility] Patient Name: Mariah Reed DOB: 03/04/1945 Initial Comment Caller states c/o diabetic neuropathy pain in legs and buttocks, buttocks are swollen Nurse Assessment Nurse: Malva Cogan, RN, Juliann Pulse Date/Time (Eastern Time): 11/02/2014 3:09:06 PM Confirm and document reason for call. If symptomatic, describe symptoms. ---Caller states that she has had an ongoing problem with diabetic neuropathy pain in her legs & buttocks, noticed yesterday that her (L) buttock was swollen. Caller states that she has been prescribed Oxycodone 10 mg that she is supposed to be taking twice daily but has been take total of 12.5 mg twice daily because pain is getting worse. Has scheduled appt for 11/09/14 but wanted to know if she needs to be seen before then. Has the patient traveled out of the country within the last 30 days? ---No Does the patient require triage? ---Yes Related visit to physician within the last 2 weeks? ---No Does the PT have any chronic conditions? (i.e. diabetes, asthma, etc.) ---Yes List chronic conditions. ---Type 2 DM, COPD, H/O minor heart attack, HTN, high cholesterol, spondylosis Guidelines Guideline Title Affirmed Question Affirmed Notes Leg Pain Recent illness requiring prolonged bedrest (i.e., immobilization) Final Disposition User See Physician within 4 Hours (or PCP triage) Malva Cogan, RN, Juliann Pulse Comments Caller advised that she needs to be seen within next 3-4 hrs but advised triager that she cannot be seen within this time frame as advised, advised that someone from office will be calling her back. Caller states that if she gets worse tonight she will call 911 to take her to hospital tonight. Does say that if she can get seen tomorrow in office that she can come in tomorrow. Called back line at office & line was busy, will try again. Spoke with Barnett Applebaum at back line of office,  advised of situation & that caller refused appt at office this afternoon if one was available or to go to [place] if no appt available at office. Barnett Applebaum states that she will be calling caller back. Referrals GO TO FACILITY REFUSED Disagree/Comply: Disagree PLEASE NOTE: All timestamps contained within this report are represented as [place] Standard Time. CONFIDENTIALTY NOTICE: This fax transmission is intended only for the addressee. It contains information that is legally privileged, confidential or otherwise protected from use or disclosure. If you are not the intended recipient, you are strictly prohibited from reviewing, disclosing, copying using or disseminating any of this information or taking any action in reliance on or regarding this information. If you have received this fax in error, please notify us immediately by telephone so that we can arrange for its return to [place]. Phone: [phone], Toll-Free: [phone], Fax: [phone] Page: 2 of 2 Call Id: [id number] Disagree/Comply Reason: Disagree with instructions

## 2014-11-09 ENCOUNTER — Ambulatory Visit (INDEPENDENT_AMBULATORY_CARE_PROVIDER_SITE_OTHER): Payer: Medicare Other | Admitting: Family Medicine

## 2014-11-09 ENCOUNTER — Encounter: Payer: Self-pay | Admitting: Family Medicine

## 2014-11-09 VITALS — BP 136/74 | HR 80 | Temp 97.9°F | Wt 209.0 lb

## 2014-11-09 DIAGNOSIS — G609 Hereditary and idiopathic neuropathy, unspecified: Secondary | ICD-10-CM | POA: Diagnosis not present

## 2014-11-09 DIAGNOSIS — M5010 Cervical disc disorder with radiculopathy, unspecified cervical region: Secondary | ICD-10-CM

## 2014-11-09 DIAGNOSIS — E118 Type 2 diabetes mellitus with unspecified complications: Secondary | ICD-10-CM | POA: Diagnosis not present

## 2014-11-09 DIAGNOSIS — I10 Essential (primary) hypertension: Secondary | ICD-10-CM

## 2014-11-09 DIAGNOSIS — Z23 Encounter for immunization: Secondary | ICD-10-CM | POA: Diagnosis not present

## 2014-11-09 LAB — COMPREHENSIVE METABOLIC PANEL
ALBUMIN: 4.4 g/dL (ref 3.5–5.2)
ALT: 17 U/L (ref 0–35)
AST: 18 U/L (ref 0–37)
Alkaline Phosphatase: 84 U/L (ref 39–117)
BILIRUBIN TOTAL: 0.3 mg/dL (ref 0.2–1.2)
BUN: 9 mg/dL (ref 6–23)
CALCIUM: 9.9 mg/dL (ref 8.4–10.5)
CO2: 30 meq/L (ref 19–32)
CREATININE: 0.75 mg/dL (ref 0.40–1.20)
Chloride: 104 mEq/L (ref 96–112)
GFR: 81.16 mL/min (ref >60.00)
Glucose, Bld: 161 mg/dL — ABNORMAL HIGH (ref 70–99)
Potassium: 4 mEq/L (ref 3.5–5.1)
SODIUM: 141 meq/L (ref 135–145)
Total Protein: 7.6 g/dL (ref 6.0–8.3)

## 2014-11-09 LAB — MICROALBUMIN / CREATININE URINE RATIO
Creatinine,U: 278.5 mg/dL
MICROALB/CREAT RATIO: 3.2 mg/g (ref 0.0–30.0)
Microalb, Ur: 8.9 mg/dL — ABNORMAL HIGH (ref 0.0–1.9)

## 2014-11-09 LAB — HEMOGLOBIN A1C: Hgb A1c MFr Bld: 7.1 % — ABNORMAL HIGH (ref 4.6–6.5)

## 2014-11-09 MED ORDER — OXYCODONE HCL 15 MG PO TABS: 15.0000 mg | ORAL_TABLET | Freq: Two times a day (BID) | ORAL | Status: DC

## 2014-11-09 NOTE — Assessment & Plan Note (Signed)
Due for labs today. No changes made to rxs. 

## 2014-11-09 NOTE — Assessment & Plan Note (Signed)
>  25 minutes spent in face to face time with patient, >50% spent in counselling or coordination of care Discussed chronic pain management. Agree to increase oxycodone to 15 mg twice daily but advised that she will absolutely need to go to a pain clinic if she feels she needs a higher dose beyond this. The patient indicates understanding of these issues and agrees with the plan.

## 2014-11-09 NOTE — Progress Notes (Signed)
Pre visit review using our clinic review tool, if applicable. No additional management support is needed unless otherwise documented below in the visit note. 

## 2014-11-09 NOTE — Progress Notes (Signed)
69 yo with multiple medical problems here for follow up.  Upset HRT is not covered.  We have appealed this through her insurer but it continues to be denied.  She wants it noted in her chart that without estrogen she feels "her spirit is leaving her."  DM- FSBS stable.  Reasonable control on Glucotrol 5 mg daily.  CBGs no higher than 250.Marland Kitchen  Checks CBG 2-3 times daily.  Checks CBGs once daily.  Pos urine micro in 11/2013. Prevnar 13 03/15/14 Lab Results  Component Value Date   HGBA1C 6.9* 07/14/2014   Chronic pain- managing ok, Oxycodone and flector patches have helped.   Having more neuropathy.  Taking Lyrica 100 mg three times daily.  Feels neuropathy is now causing rectal pain. Does not want work up for rectal pain.  Asking if she can increase dose of oxycodone.  Lyrica can cause stomach upset from time to time.    Lab Results  Component Value Date   CHOL 183 07/14/2014   HDL 49.80 07/14/2014   LDLCALC 112* 07/14/2014   LDLDIRECT 141.0 03/15/2014   TRIG 108.0 07/14/2014   CHOLHDL 4 07/14/2014     Patient Active Problem List   Diagnosis Date Noted  . Cervical disc disorder with radiculopathy of cervical region 11/09/2013  . Menopausal syndrome on hormone replacement therapy 07/14/2013  . Lipoma of back 03/11/2013  . Headache(784.0) 10/29/2012  . Rectal bleeding 10/29/2012  . Family conflict 33/54/5625  . Thrush 06/09/2012  . Chronic headaches 04/03/2012  . Hypothyroidism 09/18/2010  . Vitamin D deficiency 06/14/2010  . ECHOCARDIOGRAM, ABNORMAL 03/16/2010  . Sinusitis, chronic 03/15/2010  . MYOCARDIAL INFARCTION, HX OF 02/09/2010  . LEFT BUNDLE BRANCH BLOCK 12/26/2009  . GERD 12/26/2009  . COPD 11/10/2009  . Diabetes mellitus type 2 with complications ([place]) 63/89/3734  . HLD (hyperlipidemia) 03/12/2006  . GOUT 03/12/2006  . Hereditary and idiopathic peripheral neuropathy 03/12/2006  . Essential hypertension 03/12/2006  . ALLERGIC RHINITIS 03/12/2006   Past Medical  History  Diagnosis Date  . Allergy   . Diabetes mellitus   . Gout   . Hyperlipidemia   . Hypertension   . COPD (chronic obstructive pulmonary disease) ([place])   . Peripheral neuropathy ([place])   . Low back pain    Past Surgical History  Procedure Laterality Date  . Polypectomy      Colon  . Abdominal hysterectomy    . Tubal ligation     Social History  Substance Use Topics  . Smoking status: Never Smoker   . Smokeless tobacco: Never Used  . Alcohol Use: No   Family History  Problem Relation Age of Onset  . Uterine cancer Mother    Allergies  Allergen Reactions  . Levofloxacin Hives   Current Outpatient Prescriptions on File Prior to Visit  Medication Sig Dispense Refill  . ACCU-CHEK FASTCLIX LANCETS MISC 1 Device by Does not apply route 3 (three) times daily. Use to check blood sugar up to three times a day 102 each 12  . Alum & Mag Hydroxide-Simeth (MAGIC MOUTHWASH W/LIDOCAINE) SOLN Swish spit 5 ml four times daily as needed. 240 mL 0  . diphenhydrAMINE (BENADRYL) 25 MG tablet Take 25 mg by mouth as needed.      Marland Kitchen esomeprazole (NEXIUM) 40 MG capsule 1 tab by mouth daily 90 capsule 3  . estrogens, conjugated, (PREMARIN) 0.625 MG tablet Take 1 tablet (0.625 mg total) by mouth daily. Take daily for 21 days then do not take for 7 days.  30 tablet 0  . fexofenadine (ALLEGRA) 180 MG tablet Take 180 mg by mouth as needed.      . fluticasone (FLONASE) 50 MCG/ACT nasal spray TAKE 2 SPRAYS IN EACH NOSTRIL EVERY DAY 16 g 5  . furosemide (LASIX) 20 MG tablet Take 1 tablet (20 mg total) by mouth daily as needed. 30 tablet 4  . glipiZIDE (GLUCOTROL) 5 MG tablet TAKE 1 TABLET EVERY MORNING AND 1/2 TABLET EVERY AFTERNOON 45 tablet 3  . hydrALAZINE (APRESOLINE) 10 MG tablet TAKE 1 TABLET 3 TIMES A DAY FOR BLOOD PRESSURE 90 tablet 5  . hydrOXYzine (ATARAX/VISTARIL) 50 MG tablet Take 1 tablet (50 mg total) by mouth 3 (three) times daily as needed. 90 tablet 2  . levothyroxine (SYNTHROID,  LEVOTHROID) 75 MCG tablet TAKE ONE TABLET EVERY DAY 30 tablet 11  . lovastatin (MEVACOR) 40 MG tablet TAKE ONE TABLET BY MOUTH AT BEDTIME 30 tablet 3  . metoprolol (LOPRESSOR) 50 MG tablet TAKE ONE TABLET TWICE DAILY AS NEEDED FOR INCREASED HEART RATE 60 tablet 6  . NITROSTAT 0.4 MG SL tablet DISSOLVE 1 TAB UNDER TONGUE AS NEEDED FOR CHEST PAIN MAY REPEAT AS INDICATED 25 tablet 5  . nortriptyline (PAMELOR) 50 MG capsule TAKE 1 CAPSULE AT BEDTIME 30 capsule 5  . nystatin (MYCOSTATIN) 100000 UNIT/ML suspension TAKE 1 TEASPOONFUL BY MOUTH 4 TIMES DAILY AS DIRECTED 60 mL 6  . nystatin cream (MYCOSTATIN) APPLY TO AFFECTED AREAS EVERY DAY AS NEEDED 30 g 6  . Oxycodone HCl 10 MG TABS Take 1 tablet (10 mg total) by mouth 2 (two) times daily as needed. 60 each 0  . Oxycodone HCl 10 MG TABS Take 1 tablet (10 mg total) by mouth 2 (two) times daily as needed. 60 each 0  . PATADAY 0.2 % SOLN PLACE 1 DROP INTO BOTH EYES EVERY DAY 2.5 mL 2  . pregabalin (LYRICA) 100 MG capsule Take 1 capsule (100 mg total) by mouth 3 (three) times daily. 90 capsule 0  . PROAIR HFA 108 (90 BASE) MCG/ACT inhaler TAKE 2 PUFFS INTO LUNGS EVERY 4 HOURS ASNEEDED 8.5 g 11  . SPIRIVA HANDIHALER 18 MCG inhalation capsule INHALE 1 CAPSULE AS DIRECTED ONCE A DAY 30 capsule 5  . topiramate (TOPAMAX) 100 MG tablet TAKE THREE TABLETS BY MOUTH EVERY DAY 90 tablet 5  . zolpidem (AMBIEN) 10 MG tablet Take 1 tablet (10 mg total) by mouth at bedtime. 30 tablet 0   No current facility-administered medications on file prior to visit.     The PMH, PSH, Social History, Family History, Medications, and allergies have been reviewed in [facility], and have been updated if relevant.  Review of Systems    Review of Systems  Constitutional: Positive for fatigue.  HENT: Negative.   Eyes: Negative.   Respiratory: Negative.   Cardiovascular: Negative.   Gastrointestinal: Negative.   Endocrine: Negative.   Musculoskeletal: Negative.   Skin: Negative.    Allergic/Immunologic: Negative.   Neurological: Negative.   Hematological: Negative.   Psychiatric/Behavioral: Negative.   All other systems reviewed and are negative.    Physical Exam BP 136/74 mmHg  Pulse 80  Temp(Src) 97.9 F (36.6 C) (Oral)  Wt 209 lb (94.802 kg)  SpO2 96%  Physical Exam  Constitutional: She is oriented to person, place, and time. She appears well-developed and well-nourished. No distress.  HENT:  Head: Normocephalic.  Eyes: Conjunctivae and EOM are normal.  Cardiovascular: Normal rate and regular rhythm.   Pulmonary/Chest: Effort normal.  Neurological:  She is alert and oriented to person, place, and time. No cranial nerve deficit.  Skin: Skin is warm and dry.  Psychiatric: She has a normal mood and affect. Her behavior is normal. Judgment and thought content normal.  Nursing note and vitals reviewed.

## 2014-11-09 NOTE — Assessment & Plan Note (Signed)
Normotensive. No changes made today. 

## 2014-11-10 LAB — HEPATITIS C ANTIBODY: HCV Ab: NEGATIVE

## 2014-11-15 ENCOUNTER — Ambulatory Visit: Payer: Medicare Other | Admitting: Family Medicine

## 2014-11-16 ENCOUNTER — Telehealth: Payer: Self-pay

## 2014-11-16 NOTE — Telephone Encounter (Signed)
Pt left v/m; discussed at 11/09/14 appt with Dr Deborra Medina the neuropathy causing swelling and swelling going up into pts back; pt request rx for flector patches and try for another override with the ins co since swelling is going up into back.[facility]. Pt request cb.

## 2014-11-17 MED ORDER — DICLOFENAC EPOLAMINE 1.3 % TD PTCH: MEDICATED_PATCH | TRANSDERMAL | Status: DC

## 2014-11-17 NOTE — Telephone Encounter (Signed)
eRx sent

## 2014-11-24 ENCOUNTER — Other Ambulatory Visit: Payer: Self-pay | Admitting: Family Medicine

## 2014-11-24 NOTE — Telephone Encounter (Signed)
Rx called in to requested pharmacy 

## 2014-11-24 NOTE — Telephone Encounter (Signed)
Last f/u 11/2014

## 2014-12-22 ENCOUNTER — Telehealth: Payer: Self-pay | Admitting: Family Medicine

## 2014-12-22 NOTE — Telephone Encounter (Signed)
See note attached to Rx from pharmacy

## 2014-12-23 ENCOUNTER — Other Ambulatory Visit: Payer: Self-pay | Admitting: *Deleted

## 2014-12-23 ENCOUNTER — Other Ambulatory Visit: Payer: Self-pay | Admitting: Family Medicine

## 2014-12-23 ENCOUNTER — Other Ambulatory Visit: Payer: Self-pay | Admitting: Internal Medicine

## 2014-12-23 MED ORDER — NORTRIPTYLINE HCL 10 MG PO CAPS: 10.0000 mg | ORAL_CAPSULE | Freq: Every day | ORAL | Status: DC

## 2014-12-23 NOTE — Telephone Encounter (Addendum)
Christie with [facility] left v/m; pt requesting new rx for Nortriptyline 10 mg; pt said the 50 mg is too strong for pts stomach; pt has ulcers. Christie request cb. Dr Deborra Medina out of office and not on computer today.

## 2014-12-23 NOTE — Telephone Encounter (Signed)
New RX sent

## 2014-12-24 ENCOUNTER — Other Ambulatory Visit: Payer: Self-pay | Admitting: Family Medicine

## 2014-12-24 MED ORDER — ZOLPIDEM TARTRATE 10 MG PO TABS: 10.0000 mg | ORAL_TABLET | Freq: Every day | ORAL | Status: DC

## 2014-12-24 NOTE — Telephone Encounter (Signed)
Ok to phone in ambien 

## 2014-12-24 NOTE — Telephone Encounter (Signed)
Called [facility]. 

## 2014-12-24 NOTE — Telephone Encounter (Signed)
Received faxed refill request for zolpidem 10 mg.  Noted from pharmacy: Patient is out and Dr Deborra Medina will not be in office til Monday. She would like [place] to delivery this Friday.

## 2014-12-28 ENCOUNTER — Encounter: Payer: Self-pay | Admitting: Family Medicine

## 2014-12-28 ENCOUNTER — Ambulatory Visit (INDEPENDENT_AMBULATORY_CARE_PROVIDER_SITE_OTHER): Payer: Medicare Other | Admitting: Family Medicine

## 2014-12-28 VITALS — BP 144/82 | HR 95 | Temp 98.0°F | Wt 206.2 lb

## 2014-12-28 DIAGNOSIS — G609 Hereditary and idiopathic neuropathy, unspecified: Secondary | ICD-10-CM

## 2014-12-28 DIAGNOSIS — M5010 Cervical disc disorder with radiculopathy, unspecified cervical region: Secondary | ICD-10-CM | POA: Diagnosis not present

## 2014-12-28 MED ORDER — OXYCODONE HCL 15 MG PO TABS: 15.0000 mg | ORAL_TABLET | Freq: Three times a day (TID) | ORAL | Status: DC

## 2014-12-28 MED ORDER — AMOXICILLIN-POT CLAVULANATE 875-125 MG PO TABS: 1.0000 | ORAL_TABLET | Freq: Two times a day (BID) | ORAL | Status: DC

## 2014-12-28 NOTE — Progress Notes (Signed)
Pre visit review using our clinic review tool, if applicable. No additional management support is needed unless otherwise documented below in the visit note. 

## 2014-12-28 NOTE — Patient Instructions (Signed)
Great to see you. Happy holidays.  

## 2014-12-28 NOTE — Progress Notes (Signed)
Patient ID: Mariah Reed, female   DOB: 13-Jun-1945, 69 y.o.   MRN: [id number] [name] gave me two prescriptions that she has for oxycodone 15 mg twice daily , #60 with no refills to destroy.

## 2014-12-28 NOTE — Assessment & Plan Note (Signed)
Deteriorated. >25 minutes spent in face to face time with patient, >50% spent in counselling or coordination of care Increase oxycodone to 15 mg three times daily- aware of sedation and addiction potential.  UDS UTD. Call or return to clinic prn if these symptoms worsen or fail to improve as anticipated..,The patient indicates understanding of these issues and agrees with the plan.

## 2014-12-28 NOTE — Progress Notes (Signed)
69 yo with multiple medical problems here for follow up.   Chronic pain- managing ok, Oxycodone and flector patches have helped but has had to increase her oxycodone recently to three times daily.  More pain this time of year in her back and neuropathy is worsening.  Having more neuropathy.  Taking Lyrica 100 mg three times daily.     Lyrica can cause stomach upset from time to time.    Lab Results  Component Value Date   CHOL 183 07/14/2014   HDL 49.80 07/14/2014   LDLCALC 112* 07/14/2014   LDLDIRECT 141.0 03/15/2014   TRIG 108.0 07/14/2014   CHOLHDL 4 07/14/2014     Patient Active Problem List   Diagnosis Date Noted  . Cervical disc disorder with radiculopathy of cervical region 11/09/2013  . Menopausal syndrome on hormone replacement therapy 07/14/2013  . Lipoma of back 03/11/2013  . Headache(784.0) 10/29/2012  . Rectal bleeding 10/29/2012  . Family conflict [id number]  . Thrush 06/09/2012  . Chronic headaches 04/03/2012  . Hypothyroidism 09/18/2010  . Vitamin D deficiency 06/14/2010  . ECHOCARDIOGRAM, ABNORMAL 03/16/2010  . Sinusitis, chronic 03/15/2010  . MYOCARDIAL INFARCTION, HX OF 02/09/2010  . LEFT BUNDLE BRANCH BLOCK 12/26/2009  . GERD 12/26/2009  . COPD 11/10/2009  . Diabetes mellitus type 2 with complications ([place]) [id number]  . HLD (hyperlipidemia) 03/12/2006  . GOUT 03/12/2006  . Hereditary and idiopathic peripheral neuropathy 03/12/2006  . Essential hypertension 03/12/2006  . ALLERGIC RHINITIS 03/12/2006   Past Medical History  Diagnosis Date  . Allergy   . Diabetes mellitus   . Gout   . Hyperlipidemia   . Hypertension   . COPD (chronic obstructive pulmonary disease) ([place])   . Peripheral neuropathy ([place])   . Low back pain    Past Surgical History  Procedure Laterality Date  . Polypectomy      Colon  . Abdominal hysterectomy    . Tubal ligation     Social History  Substance Use Topics  . Smoking status: Never Smoker   . Smokeless  tobacco: Never Used  . Alcohol Use: No   Family History  Problem Relation Age of Onset  . Uterine cancer Mother    Allergies  Allergen Reactions  . Levofloxacin Hives   Current Outpatient Prescriptions on File Prior to Visit  Medication Sig Dispense Refill  . ACCU-CHEK FASTCLIX LANCETS MISC 1 Device by Does not apply route 3 (three) times daily. Use to check blood sugar up to three times a day 102 each 12  . Alum & Mag Hydroxide-Simeth (MAGIC MOUTHWASH W/LIDOCAINE) SOLN Swish spit 5 ml four times daily as needed. 240 mL 0  . diclofenac (FLECTOR) 1.3 % PTCH APPLY 1 PATCH ONTO THE SKIN AS DIRECTED 60 patch 0  . diphenhydrAMINE (BENADRYL) 25 MG tablet Take 25 mg by mouth as needed.      Marland Kitchen esomeprazole (NEXIUM) 40 MG capsule 1 tab by mouth daily 90 capsule 3  . estrogens, conjugated, (PREMARIN) 0.625 MG tablet Take 1 tablet (0.625 mg total) by mouth daily. Take daily for 21 days then do not take for 7 days. 30 tablet 0  . fexofenadine (ALLEGRA) 180 MG tablet Take 180 mg by mouth as needed.      . fluticasone (FLONASE) 50 MCG/ACT nasal spray 2 SPRAYS IN EACH NOSTRIL EVERY DAY 16 g 5  . furosemide (LASIX) 20 MG tablet Take 1 tablet (20 mg total) by mouth daily as needed. 30 tablet 4  . glipiZIDE (GLUCOTROL) 5 MG  tablet TAKE 1 TABLET EVERY MORNING AND 1/2 TABLET EVERY AFTERNOON 45 tablet 3  . hydrALAZINE (APRESOLINE) 10 MG tablet TAKE 1 TABLET 3 TIMES A DAY FOR BLOOD PRESSURE 90 tablet 5  . hydrOXYzine (ATARAX/VISTARIL) 50 MG tablet Take 1 tablet (50 mg total) by mouth 3 (three) times daily as needed. 90 tablet 2  . levothyroxine (SYNTHROID, LEVOTHROID) 75 MCG tablet TAKE ONE TABLET EVERY DAY 30 tablet 11  . lovastatin (MEVACOR) 40 MG tablet TAKE ONE TABLET AT BEDTIME 30 tablet 5  . LYRICA 100 MG capsule TAKE 1 CAPSULE 3 TIMES DAILY 90 capsule 3  . metoprolol (LOPRESSOR) 50 MG tablet TAKE ONE TABLET TWICE DAILY AS NEEDED FOR INCREASED HEART RATE 60 tablet 6  . NITROSTAT 0.4 MG SL tablet PLACE 1  TABLET UNDER TONGUE EVERY 5 MIN AS NEEDED FOR CHEST PAIN IF NO RELIEF IN15 MIN CALL 911 (MAX 3 TABS) 25 tablet 5  . nortriptyline (PAMELOR) 10 MG capsule Take 1 capsule (10 mg total) by mouth at bedtime. 30 capsule 2  . nystatin (MYCOSTATIN) 100000 UNIT/ML suspension TAKE 1 TEASPOONFUL BY MOUTH 4 TIMES DAILY AS DIRECTED 60 mL 6  . nystatin cream (MYCOSTATIN) APPLY TO AFFECTED AREAS EVERY DAY AS NEEDED 30 g 6  . PATADAY 0.2 % SOLN PLACE 1 DROP INTO BOTH EYES EVERY DAY 2.5 mL 2  . PROAIR HFA 108 (90 BASE) MCG/ACT inhaler TAKE 2 PUFFS INTO LUNGS EVERY 4 HOURS ASNEEDED 8.5 g 11  . SPIRIVA HANDIHALER 18 MCG inhalation capsule INHALE 1 CAPSULE ONCE A DAY 30 capsule 1  . topiramate (TOPAMAX) 100 MG tablet TAKE THREE TABLETS BY MOUTH EVERY DAY 90 tablet 5  . zolpidem (AMBIEN) 10 MG tablet Take 1 tablet (10 mg total) by mouth at bedtime. 30 tablet 0   No current facility-administered medications on file prior to visit.     The PMH, PSH, Social History, Family History, Medications, and allergies have been reviewed in [facility], and have been updated if relevant.  Review of Systems    Review of Systems  Constitutional: Positive for fatigue.  HENT: Negative.   Eyes: Negative.   Respiratory: Negative.   Cardiovascular: Negative.   Gastrointestinal: Negative.   Endocrine: Negative.   Musculoskeletal: Positive for myalgias, back pain, joint swelling, arthralgias, neck pain and neck stiffness.  Skin: Negative.   Allergic/Immunologic: Negative.   Neurological: Negative.   Hematological: Negative.   Psychiatric/Behavioral: Negative.   All other systems reviewed and are negative.    Physical Exam BP 144/82 mmHg  Pulse 95  Temp(Src) 98 F (36.7 C) (Oral)  Wt 206 lb 4 oz (93.554 kg)  SpO2 96%  Physical Exam  Constitutional: She is oriented to person, place, and time. She appears well-developed and well-nourished. No distress.  HENT:  Head: Normocephalic.  Eyes: Conjunctivae and EOM are normal.   Cardiovascular: Normal rate and regular rhythm.   Pulmonary/Chest: Effort normal.  Neurological: She is alert and oriented to person, place, and time. No cranial nerve deficit.  Skin: Skin is warm and dry.  Psychiatric: She has a normal mood and affect. Her behavior is normal. Judgment and thought content normal.  Nursing note and vitals reviewed.

## 2015-01-24 ENCOUNTER — Other Ambulatory Visit: Payer: Self-pay | Admitting: Family Medicine

## 2015-01-27 ENCOUNTER — Other Ambulatory Visit: Payer: Self-pay

## 2015-01-27 MED ORDER — ZOLPIDEM TARTRATE 10 MG PO TABS: 10.0000 mg | ORAL_TABLET | Freq: Every day | ORAL | Status: DC

## 2015-01-27 NOTE — Telephone Encounter (Signed)
Last filled 12/24/2014--please advise

## 2015-01-27 NOTE — Telephone Encounter (Signed)
Pt called to ck status of ambien refill. [facility]ication [facility]. Pt notified done and voiced understanding.

## 2015-02-21 ENCOUNTER — Telehealth: Payer: Self-pay

## 2015-02-21 NOTE — Telephone Encounter (Signed)
Pt does not need refills now but wants chart updated; changing pharmacies to [place]. Done as requested.

## 2015-02-22 ENCOUNTER — Other Ambulatory Visit: Payer: Self-pay

## 2015-02-22 MED ORDER — HYDROXYZINE HCL 50 MG PO TABS: 50.0000 mg | ORAL_TABLET | Freq: Three times a day (TID) | ORAL | Status: DC | PRN

## 2015-02-22 MED ORDER — ZOLPIDEM TARTRATE 10 MG PO TABS: 10.0000 mg | ORAL_TABLET | Freq: Every day | ORAL | Status: DC

## 2015-02-22 MED ORDER — NYSTATIN 100000 UNIT/GM EX CREA: TOPICAL_CREAM | CUTANEOUS | Status: DC

## 2015-02-22 MED ORDER — FLUTICASONE PROPIONATE 50 MCG/ACT NA SUSP: NASAL | Status: DC

## 2015-02-22 NOTE — Telephone Encounter (Signed)
[place] left v/m requesting refill zolpidem(last refilled # 30 on 01/27/15), nystatin cream(last refilled 30 g x 6 on 03/02/14), flonase(last filled 16 g x 5 on 12/23/14) and vistaril(last refilled # 90 x 2 on 11/09/13). Last seen 11/09/14.Please advise.

## 2015-02-23 NOTE — Telephone Encounter (Signed)
Rx called in to requested pharmacy 

## 2015-03-07 ENCOUNTER — Telehealth: Payer: Self-pay | Admitting: *Deleted

## 2015-03-07 NOTE — Telephone Encounter (Signed)
Letter received indicating pts esomeprazole will no longer be covered. Pt was supplied with a 30 day supply, but upon completion, additional refills will not be granted. Spoke to pt and advised. States that she too received the letter and will find alternate methods to fill, and will perhaps, obtain OTC

## 2015-03-09 ENCOUNTER — Encounter: Payer: Self-pay | Admitting: *Deleted

## 2015-03-09 ENCOUNTER — Encounter: Payer: Self-pay | Admitting: Family Medicine

## 2015-03-09 ENCOUNTER — Ambulatory Visit (INDEPENDENT_AMBULATORY_CARE_PROVIDER_SITE_OTHER): Payer: Medicare Other | Admitting: Family Medicine

## 2015-03-09 ENCOUNTER — Ambulatory Visit: Payer: Medicare Other | Admitting: Family Medicine

## 2015-03-09 VITALS — BP 134/72 | HR 92 | Temp 97.6°F | Wt 202.5 lb

## 2015-03-09 DIAGNOSIS — I10 Essential (primary) hypertension: Secondary | ICD-10-CM

## 2015-03-09 DIAGNOSIS — M5010 Cervical disc disorder with radiculopathy, unspecified cervical region: Secondary | ICD-10-CM

## 2015-03-09 DIAGNOSIS — E038 Other specified hypothyroidism: Secondary | ICD-10-CM

## 2015-03-09 DIAGNOSIS — E785 Hyperlipidemia, unspecified: Secondary | ICD-10-CM

## 2015-03-09 DIAGNOSIS — E118 Type 2 diabetes mellitus with unspecified complications: Secondary | ICD-10-CM | POA: Diagnosis not present

## 2015-03-09 LAB — COMPREHENSIVE METABOLIC PANEL
ALBUMIN: 4.6 g/dL (ref 3.5–5.2)
ALK PHOS: 87 U/L (ref 39–117)
ALT: 15 U/L (ref 0–35)
AST: 16 U/L (ref 0–37)
BUN: 14 mg/dL (ref 6–23)
CALCIUM: 10 mg/dL (ref 8.4–10.5)
CO2: 28 mEq/L (ref 19–32)
Chloride: 101 mEq/L (ref 96–112)
Creatinine, Ser: 0.87 mg/dL (ref 0.40–1.20)
GFR: 68.32 mL/min (ref >60.00)
Glucose, Bld: 120 mg/dL — ABNORMAL HIGH (ref 70–99)
POTASSIUM: 3.5 meq/L (ref 3.5–5.1)
Sodium: 139 mEq/L (ref 135–145)
TOTAL PROTEIN: 7.9 g/dL (ref 6.0–8.3)
Total Bilirubin: 0.4 mg/dL (ref 0.2–1.2)

## 2015-03-09 LAB — HEMOGLOBIN A1C: HEMOGLOBIN A1C: 7.1 % — AB (ref 4.6–6.5)

## 2015-03-09 LAB — LIPID PANEL
CHOLESTEROL: 172 mg/dL (ref 0–200)
HDL: 47.1 mg/dL (ref >39.00)
NonHDL: 124.71
Total CHOL/HDL Ratio: 4
Triglycerides: 213 mg/dL — ABNORMAL HIGH (ref 0.0–149.0)
VLDL: 42.6 mg/dL — ABNORMAL HIGH (ref 0.0–40.0)

## 2015-03-09 LAB — T4, FREE: Free T4: 1.05 ng/dL (ref 0.60–1.60)

## 2015-03-09 LAB — LDL CHOLESTEROL, DIRECT: Direct LDL: 106 mg/dL

## 2015-03-09 LAB — TSH: TSH: 5.43 u[IU]/mL — ABNORMAL HIGH (ref 0.35–4.50)

## 2015-03-09 MED ORDER — HYDRALAZINE HCL 10 MG PO TABS: ORAL_TABLET | ORAL | Status: DC

## 2015-03-09 MED ORDER — OXYCODONE HCL 15 MG PO TABS: 15.0000 mg | ORAL_TABLET | Freq: Three times a day (TID) | ORAL | Status: DC

## 2015-03-09 MED ORDER — NORTRIPTYLINE HCL 10 MG PO CAPS: 10.0000 mg | ORAL_CAPSULE | Freq: Every day | ORAL | Status: DC

## 2015-03-09 MED ORDER — TIOTROPIUM BROMIDE MONOHYDRATE 18 MCG IN CAPS: ORAL_CAPSULE | RESPIRATORY_TRACT | Status: DC

## 2015-03-09 NOTE — Assessment & Plan Note (Signed)
Continue current dose of synthroid.  Check labs today. 

## 2015-03-09 NOTE — Assessment & Plan Note (Signed)
Continue mevacor °

## 2015-03-09 NOTE — Assessment & Plan Note (Signed)
Reasonable control. Pneumococcal vaccines UTD. No changes made to rxs today.

## 2015-03-09 NOTE — Progress Notes (Signed)
70 yo with multiple medical problems here for follow up.   Chronic pain- managing ok, Oxycodone and flector patches have helped but she feels her OA is deteriorating more rapidly. Having more neuropathy as well.  Taking Lyrica 100 mg three times daily.     Hypothyroidism- currently taking synthroid 75 mcg daily.  Due for labs. Lab Results  Component Value Date   TSH 1.41 03/15/2014    HLD- improved control with Mevacor 40 mg daily. Lab Results  Component Value Date   CHOL 183 07/14/2014   HDL 49.80 07/14/2014   LDLCALC 112* 07/14/2014   LDLDIRECT 141.0 03/15/2014   TRIG 108.0 07/14/2014   CHOLHDL 4 07/14/2014    Lab Results  Component Value Date   ALT 17 11/09/2014   AST 18 11/09/2014   ALKPHOS 84 11/09/2014   BILITOT 0.3 11/09/2014   DM- checks FSBS 2-3 times daily.  Does not typically check it fasting.  Non fasting, ranging around 170.  Denies any episodes of hypoglycemia.  Currently taking Glucotrol 5 mg daily. Prevnar 13 03/15/14 Lab Results  Component Value Date   HGBA1C 7.1* 11/09/2014   HTN - has been well controlled on Lopressor and Lasix.  Patient Active Problem List   Diagnosis Date Noted  . Cervical disc disorder with radiculopathy of cervical region 11/09/2013  . Menopausal syndrome on hormone replacement therapy 07/14/2013  . Lipoma of back 03/11/2013  . Headache(784.0) 10/29/2012  . Rectal bleeding 10/29/2012  . Family conflict [id number]  . Thrush 06/09/2012  . Chronic headaches 04/03/2012  . Hypothyroidism 09/18/2010  . Vitamin D deficiency 06/14/2010  . ECHOCARDIOGRAM, ABNORMAL 03/16/2010  . Sinusitis, chronic 03/15/2010  . MYOCARDIAL INFARCTION, HX OF 02/09/2010  . LEFT BUNDLE BRANCH BLOCK 12/26/2009  . GERD 12/26/2009  . COPD 11/10/2009  . Diabetes mellitus type 2 with complications ([place]) [id number]  . HLD (hyperlipidemia) 03/12/2006  . GOUT 03/12/2006  . Hereditary and idiopathic peripheral neuropathy 03/12/2006  . Essential  hypertension 03/12/2006  . ALLERGIC RHINITIS 03/12/2006   Past Medical History  Diagnosis Date  . Allergy   . Diabetes mellitus   . Gout   . Hyperlipidemia   . Hypertension   . COPD (chronic obstructive pulmonary disease) ([place])   . Peripheral neuropathy ([place])   . Low back pain    Past Surgical History  Procedure Laterality Date  . Polypectomy      Colon  . Abdominal hysterectomy    . Tubal ligation     Social History  Substance Use Topics  . Smoking status: Never Smoker   . Smokeless tobacco: Never Used  . Alcohol Use: No   Family History  Problem Relation Age of Onset  . Uterine cancer Mother    Allergies  Allergen Reactions  . Levofloxacin Hives   Current Outpatient Prescriptions on File Prior to Visit  Medication Sig Dispense Refill  . ACCU-CHEK FASTCLIX LANCETS MISC 1 Device by Does not apply route 3 (three) times daily. Use to check blood sugar up to three times a day 102 each 12  . Alum & Mag Hydroxide-Simeth (MAGIC MOUTHWASH W/LIDOCAINE) SOLN Swish spit 5 ml four times daily as needed. 240 mL 0  . diclofenac (FLECTOR) 1.3 % PTCH APPLY 1 PATCH ONTO THE SKIN AS DIRECTED 60 patch 0  . diphenhydrAMINE (BENADRYL) 25 MG tablet Take 25 mg by mouth as needed.      Marland Kitchen esomeprazole (NEXIUM) 40 MG capsule 1 tab by mouth daily 90 capsule 3  . estrogens, conjugated, (  PREMARIN) 0.625 MG tablet Take 1 tablet (0.625 mg total) by mouth daily. Take daily for 21 days then do not take for 7 days. 30 tablet 0  . fexofenadine (ALLEGRA) 180 MG tablet Take 180 mg by mouth as needed.      . fluticasone (FLONASE) 50 MCG/ACT nasal spray 2 SPRAYS IN EACH NOSTRIL EVERY DAY 16 g 5  . furosemide (LASIX) 20 MG tablet Take 1 tablet (20 mg total) by mouth daily as needed. 30 tablet 4  . glipiZIDE (GLUCOTROL) 5 MG tablet TAKE ONE TABLET BY MOUTH EVERY MORNING AND TAKE ONE-HALF TABLET BY MOUTH EVERY AFTERNOON 45 tablet 5  . hydrALAZINE (APRESOLINE) 10 MG tablet TAKE 1 TABLET 3 TIMES A DAY FOR BLOOD  PRESSURE 90 tablet 5  . hydrOXYzine (ATARAX/VISTARIL) 50 MG tablet Take 1 tablet (50 mg total) by mouth 3 (three) times daily as needed. 90 tablet 2  . levothyroxine (SYNTHROID, LEVOTHROID) 75 MCG tablet TAKE ONE TABLET EVERY DAY 30 tablet 11  . lovastatin (MEVACOR) 40 MG tablet TAKE ONE TABLET AT BEDTIME 30 tablet 5  . LYRICA 100 MG capsule TAKE 1 CAPSULE 3 TIMES DAILY 90 capsule 3  . metoprolol (LOPRESSOR) 50 MG tablet TAKE ONE TABLET TWICE DAILY AS NEEDED FOR INCREASED HEART RATE 60 tablet 6  . NITROSTAT 0.4 MG SL tablet PLACE 1 TABLET UNDER TONGUE EVERY 5 MIN AS NEEDED FOR CHEST PAIN IF NO RELIEF IN15 MIN CALL 911 (MAX 3 TABS) 25 tablet 5  . nortriptyline (PAMELOR) 10 MG capsule Take 1 capsule (10 mg total) by mouth at bedtime. 30 capsule 2  . nystatin (MYCOSTATIN) 100000 UNIT/ML suspension TAKE 1 TEASPOONFUL BY MOUTH 4 TIMES DAILY AS DIRECTED 60 mL 6  . nystatin cream (MYCOSTATIN) APPLY TO AFFECTED AREAS EVERY DAY AS NEEDED 30 g 6  . oxyCODONE (ROXICODONE) 15 MG immediate release tablet Take 1 tablet (15 mg total) by mouth 3 (three) times daily. 90 tablet 0  . PATADAY 0.2 % SOLN PLACE 1 DROP INTO BOTH EYES EVERY DAY 2.5 mL 2  . PROAIR HFA 108 (90 BASE) MCG/ACT inhaler TAKE 2 PUFFS INTO LUNGS EVERY 4 HOURS ASNEEDED 8.5 g 11  . SPIRIVA HANDIHALER 18 MCG inhalation capsule INHALE 1 CAPSULE ONCE A DAY 30 capsule 1  . topiramate (TOPAMAX) 100 MG tablet TAKE THREE TABLETS BY MOUTH EVERY DAY 90 tablet 5  . zolpidem (AMBIEN) 10 MG tablet Take 1 tablet (10 mg total) by mouth at bedtime. 30 tablet 0   No current facility-administered medications on file prior to visit.     The PMH, PSH, Social History, Family History, Medications, and allergies have been reviewed in [facility], and have been updated if relevant.  Review of Systems    Review of Systems  Constitutional: Positive for fatigue.  HENT: Negative.   Eyes: Negative.   Respiratory: Negative.   Cardiovascular: Negative.   Gastrointestinal:  Negative.   Endocrine: Negative.   Musculoskeletal: Positive for myalgias, back pain, joint swelling, arthralgias, neck pain and neck stiffness.  Skin: Negative.   Allergic/Immunologic: Negative.   Neurological: Negative.   Hematological: Negative.   Psychiatric/Behavioral: Negative.   All other systems reviewed and are negative.    Physical Exam BP 134/72 mmHg  Pulse 92  Temp(Src) 97.6 F (36.4 C) (Oral)  Wt 202 lb 8 oz (91.853 kg)  SpO2 97%  Physical Exam  Constitutional: She is oriented to person, place, and time. She appears well-developed and well-nourished. No distress.  HENT:  Head: Normocephalic.  Eyes: Conjunctivae and EOM are normal.  Cardiovascular: Normal rate and regular rhythm.   Pulmonary/Chest: Effort normal.  Neurological: She is alert and oriented to person, place, and time. No cranial nerve deficit.  Skin: Skin is warm and dry.  Psychiatric: She has a normal mood and affect. Her behavior is normal. Judgment and thought content normal.  Nursing note and vitals reviewed.

## 2015-03-09 NOTE — Assessment & Plan Note (Signed)
At goal. No changes made today. 

## 2015-03-09 NOTE — Assessment & Plan Note (Signed)
On chronic narcotics- She has been compliant with pain management contract.

## 2015-03-09 NOTE — Progress Notes (Signed)
Pre visit review using our clinic review tool, if applicable. No additional management support is needed unless otherwise documented below in the visit note. 

## 2015-03-16 ENCOUNTER — Encounter: Payer: Self-pay | Admitting: *Deleted

## 2015-03-28 ENCOUNTER — Other Ambulatory Visit: Payer: Self-pay | Admitting: Family Medicine

## 2015-03-28 NOTE — Telephone Encounter (Signed)
Received refill electronically Last refill 02/22/15 #30 Last office visit 03/09/15 Is it okay to refill?

## 2015-03-28 NOTE — Telephone Encounter (Signed)
Rx called to pharmacy as instructed. 

## 2015-04-25 ENCOUNTER — Other Ambulatory Visit: Payer: Self-pay | Admitting: Family Medicine

## 2015-04-25 ENCOUNTER — Telehealth: Payer: Self-pay | Admitting: Family Medicine

## 2015-04-25 NOTE — Telephone Encounter (Signed)
Spoke to pharmacy and advised request not received. See pts chart. Sherren Mocha, then sent request while on the phone and was received. Rx to be reviewed and forwarded to Dr Deborra Medina for review.

## 2015-04-25 NOTE — Telephone Encounter (Signed)
Todd at [facility] called and said he has been e-scribing prescription requests for patient and they keep getting sent back saying she's not our patient.  He said he has sent several today.  He said he'll try and send it again now.

## 2015-04-25 NOTE — Telephone Encounter (Signed)
Patient called to find out about the prescription.  Patient doesn't know why it keeps being sent back to pharmacy.  Patient need Metoprolol and Ambien refilled.

## 2015-04-26 ENCOUNTER — Other Ambulatory Visit: Payer: Self-pay | Admitting: *Deleted

## 2015-04-26 MED ORDER — ZOLPIDEM TARTRATE 10 MG PO TABS: 10.0000 mg | ORAL_TABLET | Freq: Every day | ORAL | Status: DC

## 2015-04-26 NOTE — Telephone Encounter (Signed)
Last f/u 02/01

## 2015-04-26 NOTE — Telephone Encounter (Signed)
Rx called in to requested pharmacy 

## 2015-05-11 ENCOUNTER — Other Ambulatory Visit: Payer: Medicare Other

## 2015-05-19 ENCOUNTER — Other Ambulatory Visit: Payer: Self-pay

## 2015-05-19 ENCOUNTER — Other Ambulatory Visit (INDEPENDENT_AMBULATORY_CARE_PROVIDER_SITE_OTHER): Payer: Medicare Other

## 2015-05-19 DIAGNOSIS — E039 Hypothyroidism, unspecified: Secondary | ICD-10-CM

## 2015-05-19 LAB — TSH: TSH: 0.97 u[IU]/mL (ref 0.35–4.50)

## 2015-05-19 LAB — T4, FREE: Free T4: 0.94 ng/dL (ref 0.60–1.60)

## 2015-05-19 MED ORDER — NYSTATIN 100000 UNIT/ML MT SUSP: OROMUCOSAL | Status: DC

## 2015-05-19 NOTE — Addendum Note (Signed)
Addended by: Marchia Bond on: 05/19/2015 10:28 AM   Modules accepted: Orders

## 2015-05-19 NOTE — Telephone Encounter (Signed)
[place] left v/m requesting refill nystatin liquid; last refilled # 60 ml x 6 on 03/02/14 and pt last seen 03/09/2015.Please advise.

## 2015-05-25 ENCOUNTER — Telehealth: Payer: Self-pay | Admitting: Family Medicine

## 2015-05-25 ENCOUNTER — Encounter: Payer: Self-pay | Admitting: Family Medicine

## 2015-05-25 DIAGNOSIS — Z79899 Other long term (current) drug therapy: Secondary | ICD-10-CM | POA: Diagnosis not present

## 2015-05-25 DIAGNOSIS — Z79891 Long term (current) use of opiate analgesic: Secondary | ICD-10-CM | POA: Diagnosis not present

## 2015-05-25 NOTE — Telephone Encounter (Signed)
Spoke to pt and informed her Rx is available for pickup from the front desk 

## 2015-05-25 NOTE — Telephone Encounter (Signed)
Last f/u 03/2015 

## 2015-05-26 NOTE — Telephone Encounter (Signed)
Todd with [facility] said pt already had a hydrocodone rx on hold; Sherren Mocha will hold the 05/25/15 hydrocodone rx until needed in pharmacy safe. FYI to Dr Deborra Medina.

## 2015-05-31 ENCOUNTER — Encounter: Payer: Self-pay | Admitting: Family Medicine

## 2015-05-31 ENCOUNTER — Ambulatory Visit (INDEPENDENT_AMBULATORY_CARE_PROVIDER_SITE_OTHER): Payer: Medicare Other | Admitting: Family Medicine

## 2015-05-31 VITALS — BP 162/84 | HR 83 | Temp 98.4°F | Wt 208.8 lb

## 2015-05-31 DIAGNOSIS — R51 Headache: Secondary | ICD-10-CM | POA: Diagnosis not present

## 2015-05-31 DIAGNOSIS — R519 Headache, unspecified: Secondary | ICD-10-CM

## 2015-05-31 DIAGNOSIS — M5010 Cervical disc disorder with radiculopathy, unspecified cervical region: Secondary | ICD-10-CM

## 2015-05-31 NOTE — Progress Notes (Signed)
Pre visit review using our clinic review tool, if applicable. No additional management support is needed unless otherwise documented below in the visit note. 

## 2015-05-31 NOTE — Assessment & Plan Note (Signed)
>  25 minutes spent in face to face time with patient, >50% spent in counselling or coordination of care Explained multiple times to pt that it is outside my scope of practice to manage her chronic pain by increasing her dosage of narcotics.  She is not pleased but agrees to pain clinic referral. Referral placed.

## 2015-05-31 NOTE — Progress Notes (Signed)
Subjective:   Patient ID: Mariah Reed, female    DOB: [birth year], 70 y.o.   MRN: [id number]  Mariah Reed is a pleasant 70 y.o. year old female with multiple medical problems, well known to me, who presents to clinic today with Follow-up  on 05/31/2015  HPI:   Chronic pain- Oxycodone and flector patches have helped but she feels her OA is deteriorating more rapidly. Having more neuropathy as well.  Taking Lyrica 100 mg three times daily.    She is again asking for me to increase the dose of her oxycodone.  Current Outpatient Prescriptions on File Prior to Visit  Medication Sig Dispense Refill  . ACCU-CHEK FASTCLIX LANCETS MISC 1 Device by Does not apply route 3 (three) times daily. Use to check blood sugar up to three times a day 102 each 12  . Alum & Mag Hydroxide-Simeth (MAGIC MOUTHWASH W/LIDOCAINE) SOLN Swish spit 5 ml four times daily as needed. 240 mL 0  . diclofenac (FLECTOR) 1.3 % PTCH APPLY 1 PATCH ONTO THE SKIN AS DIRECTED 60 patch 0  . diphenhydrAMINE (BENADRYL) 25 MG tablet Take 25 mg by mouth as needed.      Marland Kitchen esomeprazole (NEXIUM) 40 MG capsule 1 tab by mouth daily 90 capsule 3  . estrogens, conjugated, (PREMARIN) 0.625 MG tablet Take 1 tablet (0.625 mg total) by mouth daily. Take daily for 21 days then do not take for 7 days. 30 tablet 0  . fexofenadine (ALLEGRA) 180 MG tablet Take 180 mg by mouth as needed.      . fluticasone (FLONASE) 50 MCG/ACT nasal spray 2 SPRAYS IN EACH NOSTRIL EVERY DAY 16 g 5  . furosemide (LASIX) 20 MG tablet Take 1 tablet (20 mg total) by mouth daily as needed. 30 tablet 4  . glipiZIDE (GLUCOTROL) 5 MG tablet TAKE ONE TABLET BY MOUTH EVERY MORNING AND TAKE ONE-HALF TABLET BY MOUTH EVERY AFTERNOON 45 tablet 5  . hydrALAZINE (APRESOLINE) 10 MG tablet TAKE 1 TABLET 3 TIMES A DAY FOR BLOOD PRESSURE 90 tablet 3  . hydrOXYzine (ATARAX/VISTARIL) 50 MG tablet Take 1 tablet (50 mg total) by mouth 3 (three) times daily as needed. 90 tablet 2  .  levothyroxine (SYNTHROID, LEVOTHROID) 75 MCG tablet TAKE ONE TABLET EVERY DAY 30 tablet 11  . lovastatin (MEVACOR) 40 MG tablet TAKE ONE TABLET AT BEDTIME 30 tablet 5  . LYRICA 100 MG capsule TAKE 1 CAPSULE BY MOUTH 3 TIMES A DAY 90 capsule 0  . metoprolol (LOPRESSOR) 50 MG tablet TAKE 1 TABLET BY MOUTH TWICE A DAY AS NEEDED FOR INCREASED HEART RATE 60 tablet 5  . NITROSTAT 0.4 MG SL tablet PLACE 1 TABLET UNDER TONGUE EVERY 5 MIN AS NEEDED FOR CHEST PAIN IF NO RELIEF IN15 MIN CALL 911 (MAX 3 TABS) 25 tablet 5  . nortriptyline (PAMELOR) 10 MG capsule Take 1 capsule (10 mg total) by mouth at bedtime. 30 capsule 3  . nystatin (MYCOSTATIN) 100000 UNIT/ML suspension TAKE 1 TEASPOONFUL BY MOUTH 4 TIMES DAILY AS DIRECTED 60 mL 6  . nystatin cream (MYCOSTATIN) APPLY TO AFFECTED AREAS EVERY DAILY AS NEEDED 30 g 0  . oxyCODONE (ROXICODONE) 15 MG immediate release tablet Take 1 tablet (15 mg total) by mouth 3 (three) times daily. 90 tablet 0  . PATADAY 0.2 % SOLN PLACE 1 DROP INTO BOTH EYES EVERY DAY 2.5 mL 2  . PROAIR HFA 108 (90 BASE) MCG/ACT inhaler TAKE 2 PUFFS INTO LUNGS EVERY 4 HOURS ASNEEDED 8.5  g 11  . tiotropium (SPIRIVA HANDIHALER) 18 MCG inhalation capsule INHALE 1 CAPSULE ONCE A DAY 30 capsule 3  . topiramate (TOPAMAX) 100 MG tablet TAKE THREE TABLETS BY MOUTH EVERY DAY 90 tablet 5  . zolpidem (AMBIEN) 10 MG tablet TAKE 1 TABLET BY MOUTH EVERY NIGHT AT BEDTIME 30 tablet 0   No current facility-administered medications on file prior to visit.    Allergies  Allergen Reactions  . Levofloxacin Hives    Past Medical History  Diagnosis Date  . Allergy   . Diabetes mellitus   . Gout   . Hyperlipidemia   . Hypertension   . COPD (chronic obstructive pulmonary disease) ([place])   . Peripheral neuropathy ([place])   . Low back pain     Past Surgical History  Procedure Laterality Date  . Polypectomy      Colon  . Abdominal hysterectomy    . Tubal ligation      Family History  Problem  Relation Age of Onset  . Uterine cancer Mother     Social History   Social History  . Marital Status: Legally Separated    Spouse Name: Moniqua Weltzin  . Number of Children: N/A  . Years of Education: N/A   Occupational History  .     Social History Main Topics  . Smoking status: Never Smoker   . Smokeless tobacco: Never Used  . Alcohol Use: No  . Drug Use: No  . Sexual Activity: Not on file   Other Topics Concern  . Not on file   Social History Narrative   Pt would like [name] at [phone] (nephew) to be called in case of emergency and ask him to call her daughter [name] at [phone].   The PMH, PSH, Social History, Family History, Medications, and allergies have been reviewed in [facility], and have been updated if relevant.  Review of Systems  Musculoskeletal: Positive for myalgias, back pain, joint swelling, arthralgias, gait problem, neck pain and neck stiffness.  All other systems reviewed and are negative.      Objective:    BP 162/84 mmHg  Pulse 83  Temp(Src) 98.4 F (36.9 C) (Oral)  Wt 208 lb 12 oz (94.688 kg)  SpO2 97%   Physical Exam  Constitutional: She is oriented to person, place, and time. She appears well-developed and well-nourished.  HENT:  Head: Normocephalic.  Eyes: Conjunctivae are normal.  Cardiovascular: Normal rate.   Pulmonary/Chest: Effort normal.  Neurological: She is oriented to person, place, and time. No cranial nerve deficit.  Skin: Skin is warm and dry.  Psychiatric: Her affect is angry. She is agitated.          Assessment & Plan:   Cervical disc disorder with radiculopathy of cervical region - Plan: Ambulatory referral to Pain Clinic  Chronic nonintractable headache, unspecified headache type - Plan: Ambulatory referral to Pain Clinic No Follow-up on file.

## 2015-06-09 ENCOUNTER — Encounter: Payer: Self-pay | Admitting: Family Medicine

## 2015-06-22 ENCOUNTER — Ambulatory Visit: Payer: Medicare Other

## 2015-06-23 ENCOUNTER — Other Ambulatory Visit: Payer: Self-pay | Admitting: Family Medicine

## 2015-06-23 NOTE — Telephone Encounter (Signed)
Last f/u 05/2015 

## 2015-06-24 NOTE — Telephone Encounter (Signed)
Rx called in to requested pharmacy 

## 2015-07-07 ENCOUNTER — Ambulatory Visit: Payer: Medicare Other | Admitting: Family Medicine

## 2015-07-11 ENCOUNTER — Ambulatory Visit (INDEPENDENT_AMBULATORY_CARE_PROVIDER_SITE_OTHER): Payer: Medicare Other | Admitting: Family Medicine

## 2015-07-11 ENCOUNTER — Encounter: Payer: Self-pay | Admitting: Family Medicine

## 2015-07-11 VITALS — BP 148/82 | HR 94 | Temp 98.1°F | Wt 204.2 lb

## 2015-07-11 DIAGNOSIS — E038 Other specified hypothyroidism: Secondary | ICD-10-CM | POA: Diagnosis not present

## 2015-07-11 DIAGNOSIS — E118 Type 2 diabetes mellitus with unspecified complications: Secondary | ICD-10-CM | POA: Diagnosis not present

## 2015-07-11 DIAGNOSIS — E785 Hyperlipidemia, unspecified: Secondary | ICD-10-CM | POA: Diagnosis not present

## 2015-07-11 DIAGNOSIS — M150 Primary generalized (osteo)arthritis: Secondary | ICD-10-CM

## 2015-07-11 DIAGNOSIS — I10 Essential (primary) hypertension: Secondary | ICD-10-CM | POA: Diagnosis not present

## 2015-07-11 DIAGNOSIS — M5010 Cervical disc disorder with radiculopathy, unspecified cervical region: Secondary | ICD-10-CM

## 2015-07-11 DIAGNOSIS — M1990 Unspecified osteoarthritis, unspecified site: Secondary | ICD-10-CM | POA: Insufficient documentation

## 2015-07-11 DIAGNOSIS — M159 Polyosteoarthritis, unspecified: Secondary | ICD-10-CM

## 2015-07-11 LAB — HEMOGLOBIN A1C: Hgb A1c MFr Bld: 7.4 % — ABNORMAL HIGH (ref 4.6–6.5)

## 2015-07-11 MED ORDER — DICLOFENAC SODIUM 1 % TD GEL: 2.0000 g | Freq: Four times a day (QID) | TRANSDERMAL | Status: DC

## 2015-07-11 MED ORDER — OXYCODONE HCL 15 MG PO TABS: 15.0000 mg | ORAL_TABLET | Freq: Three times a day (TID) | ORAL | Status: DC

## 2015-07-11 NOTE — Progress Notes (Signed)
Pre visit review using our clinic review tool, if applicable. No additional management support is needed unless otherwise documented below in the visit note. 

## 2015-07-11 NOTE — Addendum Note (Signed)
Addended by: Lucille Passy on: 07/11/2015 11:26 AM   Modules accepted: Orders

## 2015-07-11 NOTE — Progress Notes (Signed)
70 yo with multiple medical problems here for follow up.   Chronic pain- managing ok, Oxycodone and flector patches have helped but she feels her OA is deteriorating more rapidly. Having more neuropathy as well.  Taking Lyrica 100 mg three times daily.   Flector no longer covered- asking for alternative.    Hypothyroidism- currently taking synthroid 75 mcg daily.  Due for labs. Lab Results  Component Value Date   TSH 0.97 05/19/2015    HLD- improved control with Mevacor 40 mg daily. Lab Results  Component Value Date   CHOL 172 03/09/2015   HDL 47.10 03/09/2015   LDLCALC 112* 07/14/2014   LDLDIRECT 106.0 03/09/2015   TRIG 213.0* 03/09/2015   CHOLHDL 4 03/09/2015    Lab Results  Component Value Date   ALT 15 03/09/2015   AST 16 03/09/2015   ALKPHOS 87 03/09/2015   BILITOT 0.4 03/09/2015   DM- checks FSBS 2-3 times daily.  Does not typically check it fasting.  Non fasting, ranging higher now in 200s..  Denies any episodes of hypoglycemia.  Currently taking Glucotrol 5 mg daily. Prevnar 13 03/15/14 Lab Results  Component Value Date   HGBA1C 7.1* 03/09/2015   Wt Readings from Last 3 Encounters:  07/11/15 204 lb 4 oz (92.647 kg)  05/31/15 208 lb 12 oz (94.688 kg)  03/09/15 202 lb 8 oz (91.853 kg)    HTN - has been well controlled on Lopressor and Lasix.  Lab Results  Component Value Date   CREATININE 0.87 03/09/2015    Patient Active Problem List   Diagnosis Date Noted  . Cervical disc disorder with radiculopathy of cervical region 11/09/2013  . Menopausal syndrome on hormone replacement therapy 07/14/2013  . Lipoma of back 03/11/2013  . Headache(784.0) 10/29/2012  . Rectal bleeding 10/29/2012  . Family conflict [id number]  . Thrush 06/09/2012  . Chronic headaches 04/03/2012  . Hypothyroidism 09/18/2010  . Vitamin D deficiency 06/14/2010  . ECHOCARDIOGRAM, ABNORMAL 03/16/2010  . MYOCARDIAL INFARCTION, HX OF 02/09/2010  . LEFT BUNDLE BRANCH BLOCK 12/26/2009   . GERD 12/26/2009  . COPD 11/10/2009  . Diabetes mellitus type 2 with complications ([place]) [id number]  . HLD (hyperlipidemia) 03/12/2006  . GOUT 03/12/2006  . Hereditary and idiopathic peripheral neuropathy 03/12/2006  . Essential hypertension 03/12/2006  . ALLERGIC RHINITIS 03/12/2006   Past Medical History  Diagnosis Date  . Allergy   . Diabetes mellitus   . Gout   . Hyperlipidemia   . Hypertension   . COPD (chronic obstructive pulmonary disease) ([place])   . Peripheral neuropathy ([place])   . Low back pain    Past Surgical History  Procedure Laterality Date  . Polypectomy      Colon  . Abdominal hysterectomy    . Tubal ligation     Social History  Substance Use Topics  . Smoking status: Never Smoker   . Smokeless tobacco: Never Used  . Alcohol Use: No   Family History  Problem Relation Age of Onset  . Uterine cancer Mother    Allergies  Allergen Reactions  . Levofloxacin Hives   Current Outpatient Prescriptions on File Prior to Visit  Medication Sig Dispense Refill  . ACCU-CHEK FASTCLIX LANCETS MISC 1 Device by Does not apply route 3 (three) times daily. Use to check blood sugar up to three times a day 102 each 12  . Alum & Mag Hydroxide-Simeth (MAGIC MOUTHWASH W/LIDOCAINE) SOLN Swish spit 5 ml four times daily as needed. 240 mL 0  . diclofenac (  FLECTOR) 1.3 % PTCH APPLY 1 PATCH ONTO THE SKIN AS DIRECTED 60 patch 0  . diphenhydrAMINE (BENADRYL) 25 MG tablet Take 25 mg by mouth as needed.      Marland Kitchen esomeprazole (NEXIUM) 40 MG capsule 1 tab by mouth daily 90 capsule 3  . estrogens, conjugated, (PREMARIN) 0.625 MG tablet Take 1 tablet (0.625 mg total) by mouth daily. Take daily for 21 days then do not take for 7 days. 30 tablet 0  . fexofenadine (ALLEGRA) 180 MG tablet Take 180 mg by mouth as needed.      . fluticasone (FLONASE) 50 MCG/ACT nasal spray 2 SPRAYS IN EACH NOSTRIL EVERY DAY 16 g 5  . furosemide (LASIX) 20 MG tablet Take 1 tablet (20 mg total) by mouth daily as  needed. 30 tablet 4  . glipiZIDE (GLUCOTROL) 5 MG tablet TAKE ONE TABLET BY MOUTH EVERY MORNING AND TAKE ONE-HALF TABLET BY MOUTH EVERY AFTERNOON 45 tablet 5  . hydrALAZINE (APRESOLINE) 10 MG tablet TAKE 1 TABLET 3 TIMES A DAY FOR BLOOD PRESSURE 90 tablet 3  . hydrOXYzine (ATARAX/VISTARIL) 50 MG tablet Take 1 tablet (50 mg total) by mouth 3 (three) times daily as needed. 90 tablet 2  . levothyroxine (SYNTHROID, LEVOTHROID) 75 MCG tablet TAKE ONE TABLET EVERY DAY 30 tablet 11  . lovastatin (MEVACOR) 40 MG tablet TAKE ONE TABLET AT BEDTIME 30 tablet 5  . LYRICA 100 MG capsule TAKE 1 CAPSULE BY MOUTH 3 TIMES A DAY 90 capsule 0  . metoprolol (LOPRESSOR) 50 MG tablet TAKE 1 TABLET BY MOUTH TWICE A DAY AS NEEDED FOR INCREASED HEART RATE 60 tablet 5  . NITROSTAT 0.4 MG SL tablet PLACE 1 TABLET UNDER TONGUE EVERY 5 MIN AS NEEDED FOR CHEST PAIN IF NO RELIEF IN15 MIN CALL 911 (MAX 3 TABS) 25 tablet 5  . nortriptyline (PAMELOR) 10 MG capsule Take 1 capsule (10 mg total) by mouth at bedtime. 30 capsule 3  . nystatin (MYCOSTATIN) 100000 UNIT/ML suspension TAKE 1 TEASPOONFUL BY MOUTH 4 TIMES DAILY AS DIRECTED 60 mL 6  . nystatin cream (MYCOSTATIN) APPLY TO AFFECTED AREAS EVERY DAILY AS NEEDED 30 g 0  . oxyCODONE (ROXICODONE) 15 MG immediate release tablet Take 1 tablet (15 mg total) by mouth 3 (three) times daily. 90 tablet 0  . PATADAY 0.2 % SOLN PLACE 1 DROP INTO BOTH EYES EVERY DAY 2.5 mL 2  . PROAIR HFA 108 (90 BASE) MCG/ACT inhaler TAKE 2 PUFFS INTO LUNGS EVERY 4 HOURS ASNEEDED 8.5 g 11  . tiotropium (SPIRIVA HANDIHALER) 18 MCG inhalation capsule INHALE 1 CAPSULE ONCE A DAY 30 capsule 3  . topiramate (TOPAMAX) 100 MG tablet TAKE 3 TABLETS BY MOUTH ONCE DAILY 90 tablet 0  . zolpidem (AMBIEN) 10 MG tablet TAKE 1 TABLET BY MOUTH EVERY NIGHT AT BEDTIME 30 tablet 0   No current facility-administered medications on file prior to visit.     The PMH, PSH, Social History, Family History, Medications, and  allergies have been reviewed in [facility], and have been updated if relevant.  Review of Systems    Review of Systems  Constitutional: Positive for fatigue.  HENT: Negative.   Eyes: Negative.   Respiratory: Negative.   Cardiovascular: Negative.   Gastrointestinal: Negative.   Endocrine: Negative.   Musculoskeletal: Positive for myalgias, back pain, joint swelling, arthralgias, neck pain and neck stiffness.  Skin: Negative.   Allergic/Immunologic: Negative.   Neurological: Negative.   Hematological: Negative.   Psychiatric/Behavioral: Negative.   All other systems  reviewed and are negative.    Physical Exam BP 148/82 mmHg  Pulse 94  Temp(Src) 98.1 F (36.7 C) (Oral)  Wt 204 lb 4 oz (92.647 kg)  SpO2 96%  Physical Exam  Constitutional: She is oriented to person, place, and time. She appears well-developed and well-nourished. No distress.  HENT:  Head: Normocephalic.  Eyes: Conjunctivae and EOM are normal.  Cardiovascular: Normal rate and regular rhythm.   Pulmonary/Chest: Effort normal.  Neurological: She is alert and oriented to person, place, and time. No cranial nerve deficit.  Skin: Skin is warm and dry.  Psychiatric: She has a normal mood and affect. Her behavior is normal. Judgment and thought content normal.  Nursing note and vitals reviewed.

## 2015-07-11 NOTE — Assessment & Plan Note (Signed)
Well controlled on current rx. No changes made today. 

## 2015-07-11 NOTE — Assessment & Plan Note (Signed)
Deteriorated. Admits to eating more. Check a1c before adjusting medications.

## 2015-07-11 NOTE — Assessment & Plan Note (Signed)
Well controlled. No changes made today. 

## 2015-07-11 NOTE — Assessment & Plan Note (Signed)
Deteriorated and flector no longer covered by insurance. eRx sent for voltaren gel. Call or return to clinic prn if these symptoms worsen or fail to improve as anticipated. The patient indicates understanding of these issues and agrees with the plan.

## 2015-07-12 ENCOUNTER — Encounter: Payer: Self-pay | Admitting: *Deleted

## 2015-07-18 ENCOUNTER — Telehealth: Payer: Self-pay | Admitting: Family Medicine

## 2015-07-18 NOTE — Telephone Encounter (Signed)
Todd from [facility] called regarding rx for medication (one that was on hold at the pharmacy) The pt is worried that she will be introuvble for having too many rx's written and filled and the pharmacist would like to clear this up  cb number [phone]

## 2015-07-18 NOTE — Telephone Encounter (Signed)
Please call Todd.  I am ok with her having hard rxs on file as long as she is not filling them early

## 2015-07-19 NOTE — Telephone Encounter (Signed)
Spoke to [employer] at pharmacy. They are ok with pt having multiple Rx, but they MUST include "do not fill until" date. He also states there will need to be a way for Dr Deborra Medina to confirm when pt is actually due for Rx as they are being given, with her still having one on file. Sherren Mocha states that pt is not abusing medications but they have had this issue before,and was wanting to get it resolved.

## 2015-07-19 NOTE — Telephone Encounter (Signed)
Noted  

## 2015-07-25 ENCOUNTER — Other Ambulatory Visit: Payer: Self-pay | Admitting: Family Medicine

## 2015-07-25 NOTE — Telephone Encounter (Signed)
Last f/u 07/2015 

## 2015-07-25 NOTE — Telephone Encounter (Signed)
Rx called in to requested pharmacy 

## 2015-07-27 ENCOUNTER — Ambulatory Visit: Payer: Self-pay | Admitting: Family Medicine

## 2015-08-10 ENCOUNTER — Telehealth: Payer: Self-pay | Admitting: Family Medicine

## 2015-08-10 NOTE — Telephone Encounter (Signed)
Opened in error

## 2015-08-10 NOTE — Telephone Encounter (Signed)
I decline to accept this patient.  I do wish her the best.

## 2015-08-10 NOTE — Telephone Encounter (Signed)
Spoke with pt, she stated she wanted to make an appointment with Dr. Damita Dunnings.  I asked what the appointment was in reference to and she said she wants to transfer her care to Dr. Larena Glassman she thinks she can "get more out of him for my condition".  I explained that I would need to send a message to both providers to let them know that she is requesting a transfer as a courtesy to our providers, she stated, "how about the courtesy to me? I didn't think it would take an act of congress to get an appointment".  I explained that I would contact her as soon as I got responses back from both providers.  Best number to call pt is [phone]

## 2015-08-10 NOTE — Telephone Encounter (Signed)
I am ok with the transfer but Dr. Damita Dunnings would need to agree.  I am not sure what is upsetting her as our last OV was very pleasant.

## 2015-08-10 NOTE — Telephone Encounter (Signed)
Pt called demanding an appt with Dr. Damita Dunnings 7/10-12. I explained I could make an appt with Dr. Deborra Medina during that time frame she again demanded to see Dr. Damita Dunnings. Pt stated she "is on Medicare and can see who she damn well pleases". She also said "her lawyers would get her an appt with Dr. Damita Dunnings when they hear about this." I placed her on hold and transferred call to my supervisor since I could not help her.

## 2015-08-10 NOTE — Telephone Encounter (Signed)
Patient called and said she wanted to schedule an appointment with Dr.Duncan.  I told patient she was Dr.Aron's patient.  Patient said she wanted to see Dr.Duncan and she would discuss it with him.  I let her know I couldn't make the appointment with Dr.Duncan because she's Dr.Aron's patient.  I asked patient to hold and I asked Vaughan Basta what I should do.  While I was talking to [facility], patient hung up on me.

## 2015-08-10 NOTE — Telephone Encounter (Signed)
Patient called back after she hung up on me.  When I answered the phone she hung up on me.

## 2015-08-17 NOTE — Telephone Encounter (Signed)
Spoke with patient, informed that Dr. Damita Dunnings declined the transfer.  She wanted to know if he would accept her in January 2018, I read his response verbatim.  She stated, "this doesn't smell right I'm gonna be looking".  Reminded her of appointment currently scheduled with Dr. Deborra Medina 11/07/15.

## 2015-08-24 ENCOUNTER — Other Ambulatory Visit: Payer: Self-pay

## 2015-08-24 ENCOUNTER — Other Ambulatory Visit: Payer: Self-pay | Admitting: Family Medicine

## 2015-08-24 MED ORDER — PREGABALIN 100 MG PO CAPS: 100.0000 mg | ORAL_CAPSULE | Freq: Three times a day (TID) | ORAL | Status: DC

## 2015-08-24 MED ORDER — ZOLPIDEM TARTRATE 10 MG PO TABS: 10.0000 mg | ORAL_TABLET | Freq: Every day | ORAL | Status: DC

## 2015-08-24 NOTE — Telephone Encounter (Signed)
Last filled on 07/25/15, last OV 07/11/15. Ok to refill?

## 2015-08-24 NOTE — Telephone Encounter (Signed)
Last filled on 06/23/15 #90 no ref, last OV 07/11/15.  ok to refill?

## 2015-08-25 NOTE — Telephone Encounter (Signed)
Pt called wanting to know why her zolpidem and lyrica had not been called to [place]. I apologized to pt and called both meds to [place] at [place]. Pt voiced understanding and was appreciative.

## 2015-08-25 NOTE — Telephone Encounter (Signed)
Pt called wanting to know why her zolpidem and lyrica had not been called to [place]. I apologized to pt and called both meds to [place] at [place]. Pt voiced understanding and was appreciative.

## 2015-09-19 ENCOUNTER — Telehealth: Payer: Self-pay | Admitting: Family Medicine

## 2015-09-19 NOTE — Telephone Encounter (Signed)
Please help pt with this

## 2015-09-19 NOTE — Telephone Encounter (Signed)
Pt called - she needs to have a form regarding her renewal of her diabetic supplies.    company is [employer]   Pt call back number [phone]

## 2015-09-19 NOTE — Telephone Encounter (Signed)
We will await form for review and completion

## 2015-09-21 NOTE — Telephone Encounter (Signed)
Completed form faxed to requested party;sent for scanning

## 2015-09-25 ENCOUNTER — Other Ambulatory Visit: Payer: Self-pay | Admitting: Family Medicine

## 2015-09-27 ENCOUNTER — Telehealth: Payer: Self-pay | Admitting: *Deleted

## 2015-09-27 MED ORDER — PREGABALIN 100 MG PO CAPS: 100.0000 mg | ORAL_CAPSULE | Freq: Three times a day (TID) | ORAL | 0 refills | Status: DC

## 2015-09-27 MED ORDER — ZOLPIDEM TARTRATE 10 MG PO TABS: 10.0000 mg | ORAL_TABLET | Freq: Every day | ORAL | 0 refills | Status: DC

## 2015-09-27 NOTE — Telephone Encounter (Signed)
Pt request cb with status of ambien and lyrica. Please advise.

## 2015-09-27 NOTE — Telephone Encounter (Signed)
Last f/u 07/2015

## 2015-09-28 NOTE — Telephone Encounter (Signed)
Rx has already been called into pharmacy.  

## 2015-09-28 NOTE — Telephone Encounter (Signed)
Pt called requesting call back on status of medication refill. She has 2 pills left. Please advise

## 2015-10-25 ENCOUNTER — Other Ambulatory Visit: Payer: Self-pay | Admitting: Family Medicine

## 2015-10-27 ENCOUNTER — Other Ambulatory Visit: Payer: Self-pay

## 2015-10-27 MED ORDER — ZOLPIDEM TARTRATE 10 MG PO TABS: 10.0000 mg | ORAL_TABLET | Freq: Every day | ORAL | 0 refills | Status: DC

## 2015-10-27 MED ORDER — PREGABALIN 100 MG PO CAPS: 100.0000 mg | ORAL_CAPSULE | Freq: Three times a day (TID) | ORAL | 0 refills | Status: DC

## 2015-10-27 NOTE — Telephone Encounter (Signed)
Rx called in to requested pharmacy 

## 2015-10-27 NOTE — Telephone Encounter (Signed)
Pt left v/m requesting refill Mariah Reed (last refilled # 30 on 09/27/15) and lyrica (last refilled # 90 on 09/27/15. Pt thinks were requested by pharmacy on 10/25/15. Pt request refill done today. Last seen 07/11/15. Pt request cb when refilled.

## 2015-11-07 ENCOUNTER — Ambulatory Visit: Payer: Self-pay | Admitting: Family Medicine

## 2015-11-15 ENCOUNTER — Ambulatory Visit: Payer: Medicare Other | Admitting: Family Medicine

## 2015-11-17 ENCOUNTER — Encounter: Payer: Self-pay | Admitting: Family Medicine

## 2015-11-17 ENCOUNTER — Ambulatory Visit (INDEPENDENT_AMBULATORY_CARE_PROVIDER_SITE_OTHER): Payer: Medicare Other | Admitting: Family Medicine

## 2015-11-17 VITALS — BP 120/80 | HR 94 | Temp 98.7°F | Wt 204.8 lb

## 2015-11-17 DIAGNOSIS — E118 Type 2 diabetes mellitus with unspecified complications: Secondary | ICD-10-CM | POA: Diagnosis not present

## 2015-11-17 DIAGNOSIS — I10 Essential (primary) hypertension: Secondary | ICD-10-CM

## 2015-11-17 DIAGNOSIS — E038 Other specified hypothyroidism: Secondary | ICD-10-CM | POA: Diagnosis not present

## 2015-11-17 DIAGNOSIS — Z79890 Hormone replacement therapy (postmenopausal): Secondary | ICD-10-CM

## 2015-11-17 DIAGNOSIS — M5010 Cervical disc disorder with radiculopathy, unspecified cervical region: Secondary | ICD-10-CM

## 2015-11-17 DIAGNOSIS — N951 Menopausal and female climacteric states: Secondary | ICD-10-CM

## 2015-11-17 DIAGNOSIS — Z23 Encounter for immunization: Secondary | ICD-10-CM

## 2015-11-17 DIAGNOSIS — E785 Hyperlipidemia, unspecified: Secondary | ICD-10-CM

## 2015-11-17 LAB — TSH: TSH: 3.8 u[IU]/mL (ref 0.35–4.50)

## 2015-11-17 LAB — COMPREHENSIVE METABOLIC PANEL
ALBUMIN: 4.2 g/dL (ref 3.5–5.2)
ALK PHOS: 87 U/L (ref 39–117)
ALT: 17 U/L (ref 0–35)
AST: 23 U/L (ref 0–37)
BUN: 8 mg/dL (ref 6–23)
CALCIUM: 10.1 mg/dL (ref 8.4–10.5)
CO2: 30 mEq/L (ref 19–32)
Chloride: 103 mEq/L (ref 96–112)
Creatinine, Ser: 1 mg/dL (ref 0.40–1.20)
GFR: 58.06 mL/min — AB (ref >60.00)
Glucose, Bld: 223 mg/dL — ABNORMAL HIGH (ref 70–99)
POTASSIUM: 3.7 meq/L (ref 3.5–5.1)
Sodium: 138 mEq/L (ref 135–145)
TOTAL PROTEIN: 7.6 g/dL (ref 6.0–8.3)
Total Bilirubin: 0.4 mg/dL (ref 0.2–1.2)

## 2015-11-17 LAB — HEMOGLOBIN A1C: Hgb A1c MFr Bld: 7.7 % — ABNORMAL HIGH (ref 4.6–6.5)

## 2015-11-17 MED ORDER — OXYCODONE HCL 15 MG PO TABS: 15.0000 mg | ORAL_TABLET | Freq: Three times a day (TID) | ORAL | 0 refills | Status: DC

## 2015-11-17 NOTE — Assessment & Plan Note (Signed)
Deteriorated. Narcotics rxs printed. Refer to orthopedists.

## 2015-11-17 NOTE — Progress Notes (Signed)
70 yo with multiple medical problems here for follow up.   Chronic pain- cervical pain and radiculopathy worse since she cannot afford flector patches.  Wants referral to ortho.  Having more neuropathy as well.  Taking Lyrica 100 mg three times daily.     Hypothyroidism- currently taking synthroid 75 mcg daily.  Clinically euthyroid. Lab Results  Component Value Date   TSH 0.97 05/19/2015    HLD- improved control with Mevacor 40 mg daily. Lab Results  Component Value Date   CHOL 172 03/09/2015   HDL 47.10 03/09/2015   LDLCALC 112 (H) 07/14/2014   LDLDIRECT 106.0 03/09/2015   TRIG 213.0 (H) 03/09/2015   CHOLHDL 4 03/09/2015    Lab Results  Component Value Date   ALT 15 03/09/2015   AST 16 03/09/2015   ALKPHOS 87 03/09/2015   BILITOT 0.4 03/09/2015   DM- checks FSBS 2-3 times daily.   Non fasting, ranging higher now in 200s..  Denies any episodes of hypoglycemia.  Currently taking Glucotrol 5 mg daily. Prevnar 13 03/15/14 Lab Results  Component Value Date   HGBA1C 7.4 (H) 07/11/2015   Wt Readings from Last 3 Encounters:  11/17/15 204 lb 12 oz (92.9 kg)  07/11/15 204 lb 4 oz (92.6 kg)  05/31/15 208 lb 12 oz (94.7 kg)    HTN - has been well controlled on Lopressor and Lasix.  Lab Results  Component Value Date   CREATININE 0.87 03/09/2015    Patient Active Problem List  Diagnosis  . Diabetes mellitus type 2 with complications ([place])  . HLD (hyperlipidemia)  . GOUT  . Hereditary and idiopathic peripheral neuropathy  . Essential hypertension  . LEFT BUNDLE BRANCH BLOCK  . ALLERGIC RHINITIS  . COPD  . GERD  . MYOCARDIAL INFARCTION, HX OF  . ECHOCARDIOGRAM, ABNORMAL  . Vitamin D deficiency  . Hypothyroidism  . Chronic headaches  . Family conflict  . Headache(784.0)  . Menopausal syndrome on hormone replacement therapy  . Cervical disc disorder with radiculopathy of cervical region  . OA (osteoarthritis)   Past Medical History:  Diagnosis Date  .  Allergy   . COPD (chronic obstructive pulmonary disease) ([place])   . Diabetes mellitus   . Gout   . Hyperlipidemia   . Hypertension   . Low back pain   . Peripheral neuropathy [facility])    Past Surgical History:  Procedure Laterality Date  . ABDOMINAL HYSTERECTOMY    . POLYPECTOMY     Colon  . TUBAL LIGATION     Social History  Substance Use Topics  . Smoking status: Never Smoker  . Smokeless tobacco: Never Used  . Alcohol use No   Family History  Problem Relation Age of Onset  . Uterine cancer Mother    Allergies  Allergen Reactions  . Levofloxacin Hives   Current Outpatient Prescriptions on File Prior to Visit  Medication Sig Dispense Refill  . ACCU-CHEK FASTCLIX LANCETS MISC 1 Device by Does not apply route 3 (three) times daily. Use to check blood sugar up to three times a day 102 each 12  . Alum & Mag Hydroxide-Simeth (MAGIC MOUTHWASH W/LIDOCAINE) SOLN Swish spit 5 ml four times daily as needed. 240 mL 0  . diphenhydrAMINE (BENADRYL) 25 MG tablet Take 25 mg by mouth as needed.      Marland Kitchen esomeprazole (NEXIUM) 40 MG capsule 1 tab by mouth daily 90 capsule 3  . estrogens, conjugated, (PREMARIN) 0.625 MG tablet Take 1 tablet (0.625 mg total) by mouth  daily. Take daily for 21 days then do not take for 7 days. 30 tablet 0  . fexofenadine (ALLEGRA) 180 MG tablet Take 180 mg by mouth as needed.      . fluticasone (FLONASE) 50 MCG/ACT nasal spray INHALE 2 SPRAYS IN EACH NOSTRIL DAILY 16 g 5  . furosemide (LASIX) 20 MG tablet Take 1 tablet (20 mg total) by mouth daily as needed. 30 tablet 4  . glipiZIDE (GLUCOTROL) 5 MG tablet TAKE 1 TABLET BY MOUTH EVERY MORNING, THEN 1/2 TABLET BY MOUTH EVERY AFTERNOON 45 tablet 5  . hydrALAZINE (APRESOLINE) 10 MG tablet TAKE 1 TABLET BY MOUTH 3 TIMES DAILY FORBLOOD PRESSURE 90 tablet 1  . hydrOXYzine (ATARAX/VISTARIL) 50 MG tablet Take 1 tablet (50 mg total) by mouth 3 (three) times daily as needed. 90 tablet 2  . levothyroxine (SYNTHROID, LEVOTHROID)  75 MCG tablet TAKE 1 TABLET BY MOUTH ONCE A DAY 30 tablet 11  . lovastatin (MEVACOR) 40 MG tablet TAKE 1 TABLET BY MOUTH EVERY NIGHT AT BEDTIME 30 tablet 11  . metoprolol (LOPRESSOR) 50 MG tablet TAKE 1 TABLET BY MOUTH TWICE A DAY AS NEEDED FOR INCREASED HEART RATE 60 tablet 5  . nitroGLYCERIN (NITROSTAT) 0.4 MG SL tablet DISSOLVE 1 TABLET UNDER TONGUE AS NEEDEDFOR CHEST PAIN MAY REPEAT 3 TIMES 5 MINUTES APART IF NEEDED. IF NO RELIEF IN15 MINUTES CALL 911 25 tablet 2  . nortriptyline (PAMELOR) 10 MG capsule Take 1 capsule (10 mg total) by mouth at bedtime. 30 capsule 3  . nystatin (MYCOSTATIN) 100000 UNIT/ML suspension TAKE 1 TEASPOONFUL BY MOUTH 4 TIMES DAILY AS DIRECTED 60 mL 6  . nystatin cream (MYCOSTATIN) APPLY TO AFFECTED AREAS EVERY DAILY AS NEEDED 30 g 0  . PATADAY 0.2 % SOLN PLACE 1 DROP INTO BOTH EYES EVERY DAY 2.5 mL 2  . pregabalin (LYRICA) 100 MG capsule Take 1 capsule (100 mg total) by mouth 3 (three) times daily. 90 capsule 0  . PROAIR HFA 108 (90 Base) MCG/ACT inhaler INHALE 2 PUFFS INTO THE LUNGS EVERY 4 HOURS AS NEEDED 8.5 g 1  . SPIRIVA HANDIHALER 18 MCG inhalation capsule INHALE CONTENTS OF 1 CAPSULE VIA HANDIHALER BY MOUTH ONCE DAILY 30 capsule 11  . topiramate (TOPAMAX) 100 MG tablet TAKE 3 TABLETS BY MOUTH ONCE DAILY 90 tablet 1  . VOLTAREN 1 % GEL APPLY 2 GM TOPICALLY 4 TIMES A DAY 100 g 5  . zolpidem (AMBIEN) 10 MG tablet Take 1 tablet (10 mg total) by mouth at bedtime. 30 tablet 0   No current facility-administered medications on file prior to visit.      The PMH, PSH, Social History, Family History, Medications, and allergies have been reviewed in [facility], and have been updated if relevant.     Review of Systems  Constitutional: Positive for fatigue.  HENT: Negative.   Eyes: Negative.   Respiratory: Negative.   Cardiovascular: Negative.   Gastrointestinal: Negative.   Endocrine: Negative.   Musculoskeletal: Positive for arthralgias, back pain, joint swelling,  myalgias, neck pain and neck stiffness.  Skin: Negative.   Allergic/Immunologic: Negative.   Neurological: Negative.   Hematological: Negative.   Psychiatric/Behavioral: Negative.   All other systems reviewed and are negative.    Physical Exam BP 120/80   Pulse 94   Temp 98.7 F (37.1 C) (Oral)   Wt 204 lb 12 oz (92.9 kg)   SpO2 96%   BMI 35.15 kg/m   Physical Exam  Constitutional: She is oriented to person, place,  and time. She appears well-developed and well-nourished. No distress.  HENT:  Head: Normocephalic.  Eyes: Conjunctivae and EOM are normal.  Cardiovascular: Normal rate and regular rhythm.   Pulmonary/Chest: Effort normal.  Neurological: She is alert and oriented to person, place, and time. No cranial nerve deficit.  Skin: Skin is warm and dry.  Psychiatric: She has a normal mood and affect. Her behavior is normal. Judgment and thought content normal.  Nursing note and vitals reviewed.

## 2015-11-17 NOTE — Assessment & Plan Note (Signed)
Continue current rx. Check labs today. 

## 2015-11-17 NOTE — Progress Notes (Signed)
Pre visit review using our clinic review tool, if applicable. No additional management support is needed unless otherwise documented below in the visit note. 

## 2015-11-17 NOTE — Assessment & Plan Note (Signed)
Normotensive.  No changes made. 

## 2015-11-17 NOTE — Assessment & Plan Note (Signed)
Continue current rx.  No changes made today. 

## 2015-11-19 IMAGING — CT CT ABD-PELV W/ CM
2 of 5 series · 17 of 46 positions shown, 19 images · IV contrast (isovue)
Comparison: CT scan of May 03, 2010.

CLINICAL DATA: Rectal bleeding, lower abdominal pain.

EXAM:
CT ABDOMEN AND PELVIS WITH CONTRAST
TECHNIQUE: Multidetector CT imaging of the abdomen and pelvis was performed
using the standard protocol following bolus administration of
intravenous contrast.
CONTRAST:  100 mL of Isovue 370 intravenously.

[Series 2: routine with · axial · 0.86mm/px · z∈[-937,-522]mm · 14 of 93 slices shown, 16 images]
[im 5/93  soft-tissue]
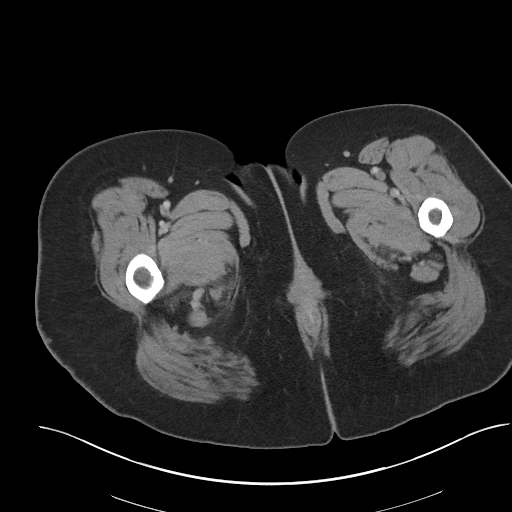
[im 5/93  bone]
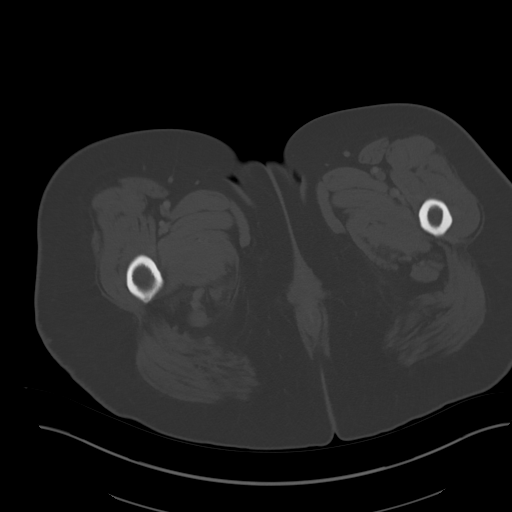
[im 10/93  soft-tissue]
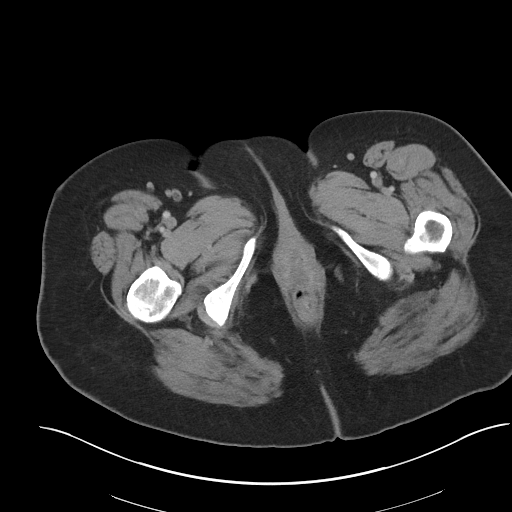
[im 20/93  soft-tissue]
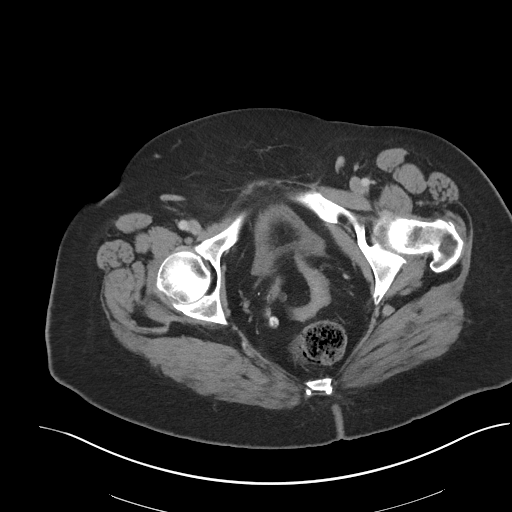
[im 25/93  soft-tissue]
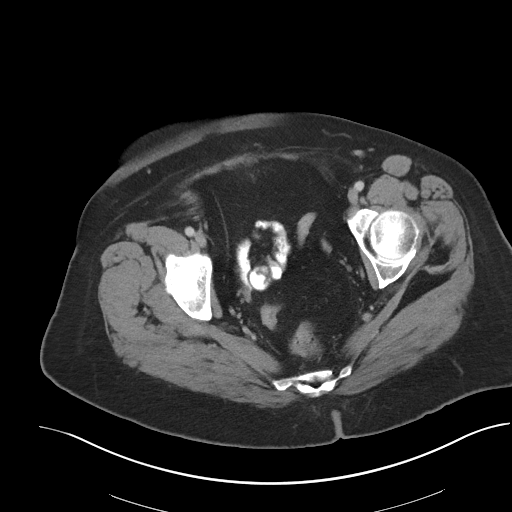
[im 30/93  soft-tissue]
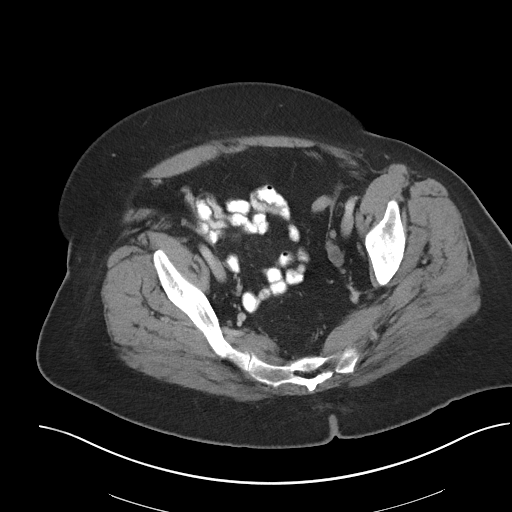
[im 39/93  soft-tissue]
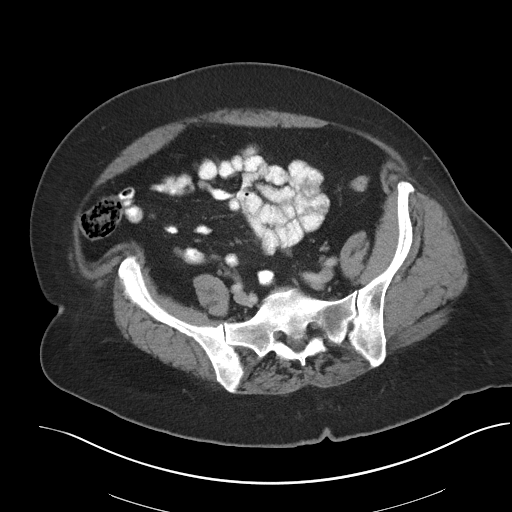
[im 44/93  soft-tissue]
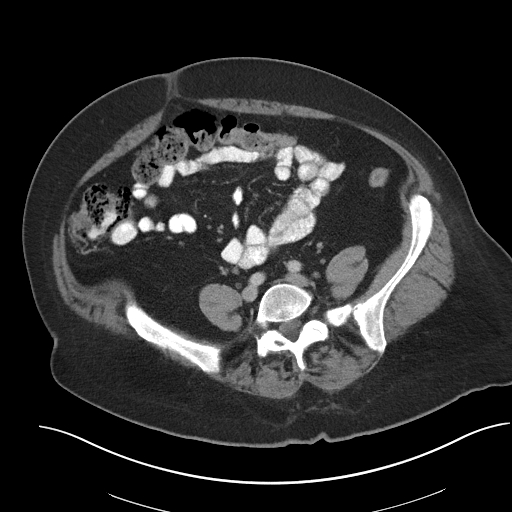
[im 49/93  soft-tissue]
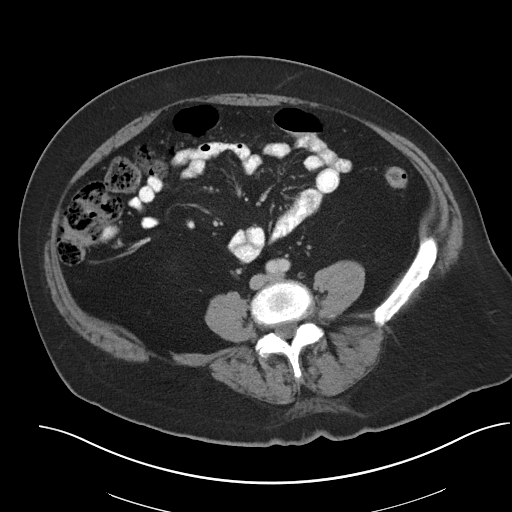
[im 54/93  soft-tissue]
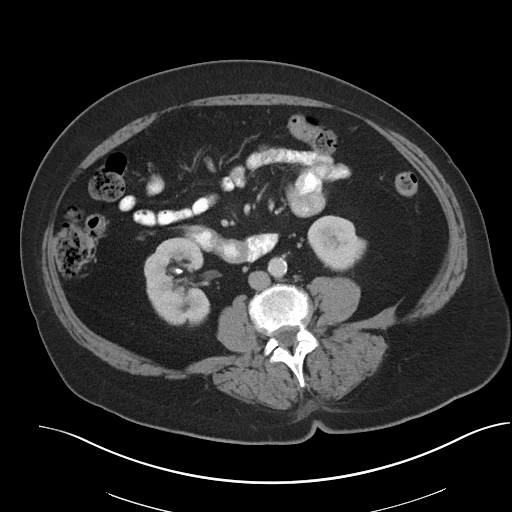
[im 54/93  bone]
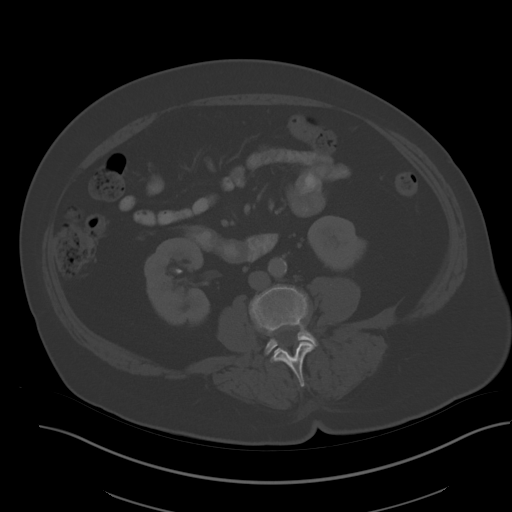
[im 63/93  soft-tissue]
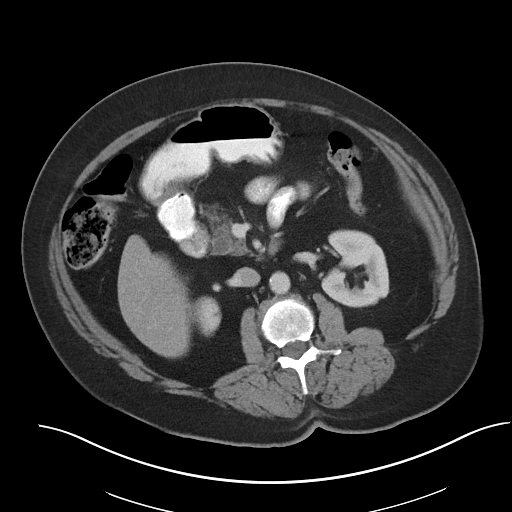
[im 68/93  soft-tissue]
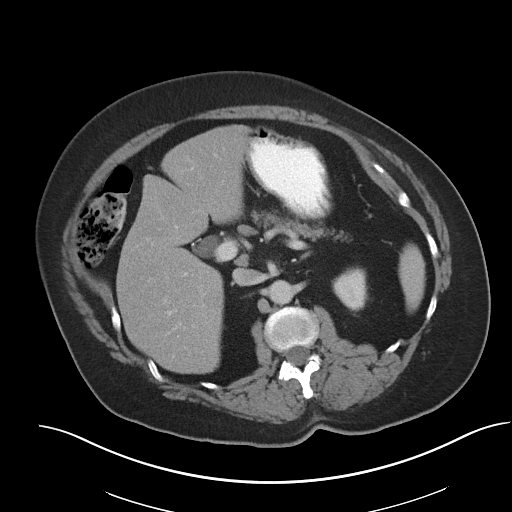
[im 73/93  soft-tissue]
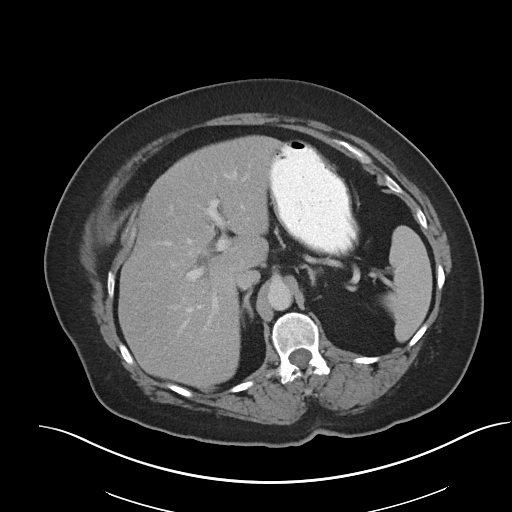
[im 83/93  soft-tissue]
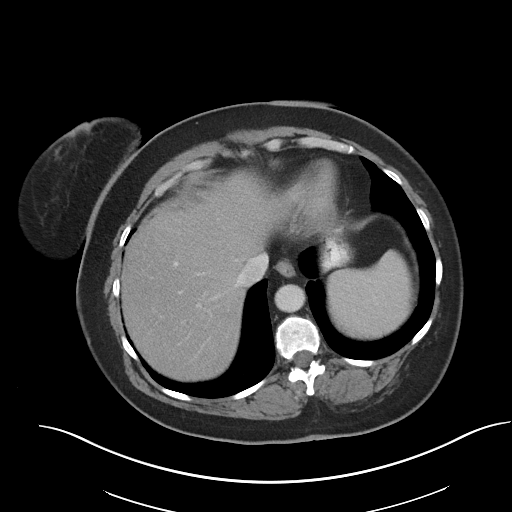
[im 88/93  soft-tissue]
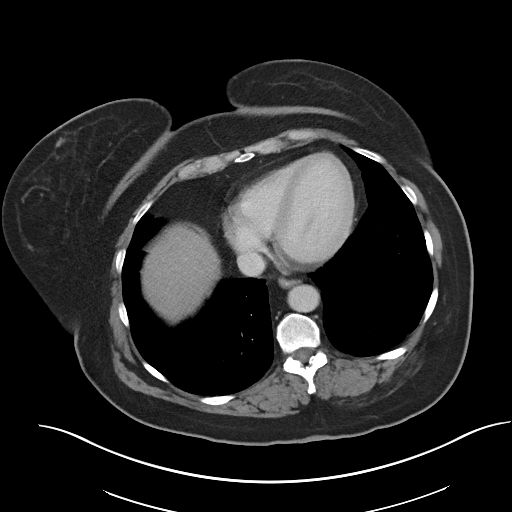

[Series 6: cor routine with · coronal · 0.89mm/px · 3 of 185 slices shown]
[im 62/185  soft-tissue]
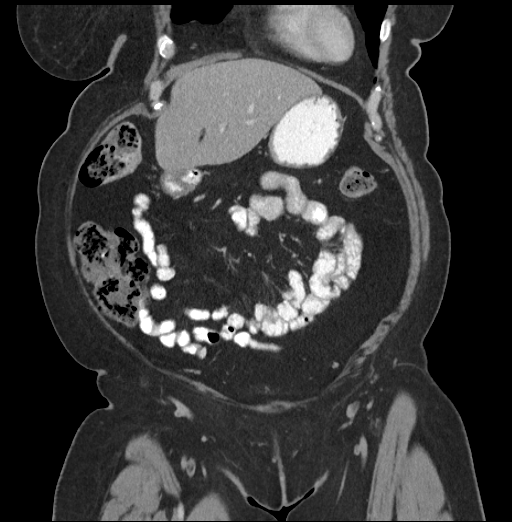
[im 82/185  soft-tissue]
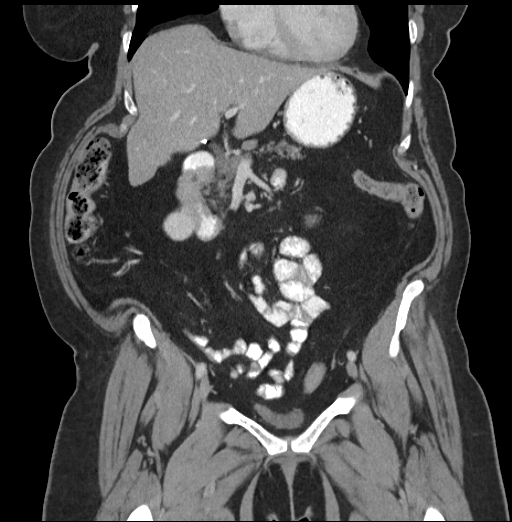
[im 103/185  soft-tissue]
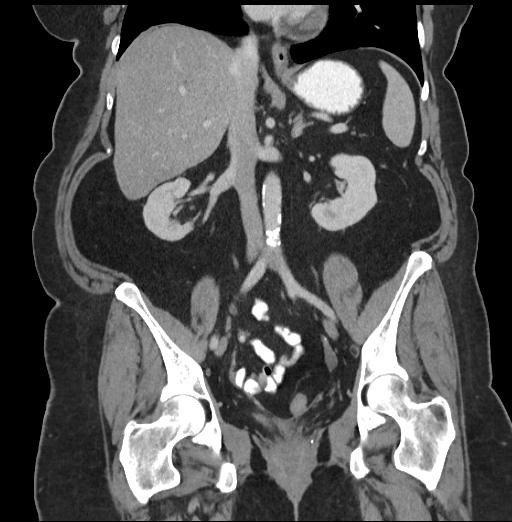

[17 of 46 positions shown; findings below may reference images not displayed]

FINDINGS: Mild multilevel degenerative disc disease is noted in the lumbar
spine. Visualized lung bases appear normal.

Status post cholecystectomy. No focal abnormality is noted in the
liver, spleen or pancreas. Adrenal glands appear normal. Stable
exophytic cyst is seen arising from upper pole of left kidney.
Nonobstructive 8 mm calculus is noted in lower pole collecting
system of right kidney. No hydronephrosis or renal obstruction is
noted. No ureteral calculi are noted. There appears to be
significant wall thickening of the first and second portions of the
duodenum ; this may represent peptic ulcer disease, but endoscopy is
recommended to rule out possible infiltrative disease such as
lymphoma. There is no evidence of bowel obstruction. Status post
hysterectomy. Urinary bladder appears normal. No abnormal fluid
collection is noted. No significant adenopathy is noted. Mild
atherosclerotic calcifications of abdominal aorta are noted.
IMPRESSION: 8 mm nonobstructive calculus seen in lower pole collecting system of
right kidney. No hydronephrosis or renal obstruction is noted.

Wall thickening of the proximal duodenum is noted concerning for
inflammation such as peptic ulcer disease, or less likely
infiltrative disease such as lymphoma. Endoscopy is recommended for
further evaluation.

## 2015-11-28 ENCOUNTER — Other Ambulatory Visit: Payer: Self-pay | Admitting: Family Medicine

## 2015-11-30 ENCOUNTER — Other Ambulatory Visit: Payer: Self-pay | Admitting: Family Medicine

## 2015-11-30 ENCOUNTER — Other Ambulatory Visit: Payer: Self-pay | Admitting: *Deleted

## 2015-11-30 MED ORDER — PREGABALIN 100 MG PO CAPS: 100.0000 mg | ORAL_CAPSULE | Freq: Three times a day (TID) | ORAL | 0 refills | Status: DC

## 2015-11-30 MED ORDER — ZOLPIDEM TARTRATE 10 MG PO TABS: 10.0000 mg | ORAL_TABLET | Freq: Every day | ORAL | 0 refills | Status: DC

## 2015-11-30 NOTE — Telephone Encounter (Signed)
Rx called in to requested pharmacy 

## 2015-11-30 NOTE — Telephone Encounter (Signed)
Last f/u 11/2015

## 2015-11-30 NOTE — Telephone Encounter (Signed)
Pt called and needs refill on zolpidem (AMBIEN) 10 MG tablet  Please advise.

## 2015-11-30 NOTE — Telephone Encounter (Signed)
Pt left v/m requesting cb with status of lyrica and ambien refill to [facility].

## 2015-12-01 NOTE — Telephone Encounter (Signed)
Pt calling [facility] does not have ambien refill and pt request refill to be done today. I spoke with Otila Kluver at [facility] and they did not receive ambien refill.Medication phoned to [place] as instructed.Otila Kluver said rx would be delivered this afternoon.pt voiced understanding.

## 2015-12-23 ENCOUNTER — Telehealth: Payer: Self-pay | Admitting: Family Medicine

## 2015-12-23 NOTE — Telephone Encounter (Signed)
Pt is requesting a cb to discuss the no show fee from 10/10 with Dr. Deborra Medina.

## 2015-12-26 ENCOUNTER — Ambulatory Visit (INDEPENDENT_AMBULATORY_CARE_PROVIDER_SITE_OTHER): Payer: Medicare Other | Admitting: Family Medicine

## 2015-12-26 ENCOUNTER — Encounter: Payer: Self-pay | Admitting: Family Medicine

## 2015-12-26 VITALS — BP 124/82 | HR 97 | Temp 98.2°F | Wt 205.5 lb

## 2015-12-26 DIAGNOSIS — J4520 Mild intermittent asthma, uncomplicated: Secondary | ICD-10-CM | POA: Diagnosis not present

## 2015-12-26 MED ORDER — FLUTICASONE FUROATE-VILANTEROL 100-25 MCG/INH IN AEPB: 1.0000 | INHALATION_SPRAY | Freq: Every day | RESPIRATORY_TRACT | Status: DC

## 2015-12-26 MED ORDER — FLUTICASONE FUROATE-VILANTEROL 100-25 MCG/INH IN AEPB: 1.0000 | INHALATION_SPRAY | Freq: Every day | RESPIRATORY_TRACT | 0 refills | Status: DC

## 2015-12-26 MED ORDER — AMOXICILLIN-POT CLAVULANATE 875-125 MG PO TABS: 1.0000 | ORAL_TABLET | Freq: Two times a day (BID) | ORAL | 0 refills | Status: AC

## 2015-12-26 NOTE — Progress Notes (Signed)
SUBJECTIVE:  Mariah Reed is a 70 y.o. female who complains of coryza, congestion, dry cough and bilateral sinus pain for 8 days. She denies a history of anorexia and chest pain and admits to a history of asthma. Patient denies smoke cigarettes.   Current Outpatient Prescriptions on File Prior to Visit  Medication Sig Dispense Refill  . ACCU-CHEK FASTCLIX LANCETS MISC 1 Device by Does not apply route 3 (three) times daily. Use to check blood sugar up to three times a day 102 each 12  . Alum & Mag Hydroxide-Simeth (MAGIC MOUTHWASH W/LIDOCAINE) SOLN Swish spit 5 ml four times daily as needed. 240 mL 0  . diphenhydrAMINE (BENADRYL) 25 MG tablet Take 25 mg by mouth as needed.      Marland Kitchen esomeprazole (NEXIUM) 40 MG capsule 1 tab by mouth daily 90 capsule 3  . estrogens, conjugated, (PREMARIN) 0.625 MG tablet Take 1 tablet (0.625 mg total) by mouth daily. Take daily for 21 days then do not take for 7 days. 30 tablet 0  . fexofenadine (ALLEGRA) 180 MG tablet Take 180 mg by mouth as needed.      . fluticasone (FLONASE) 50 MCG/ACT nasal spray INHALE 2 SPRAYS IN EACH NOSTRIL DAILY 16 g 5  . furosemide (LASIX) 20 MG tablet Take 1 tablet (20 mg total) by mouth daily as needed. 30 tablet 4  . glipiZIDE (GLUCOTROL) 5 MG tablet TAKE 1 TABLET BY MOUTH EVERY MORNING, THEN 1/2 TABLET BY MOUTH EVERY AFTERNOON 45 tablet 5  . hydrALAZINE (APRESOLINE) 10 MG tablet TAKE 1 TABLET BY MOUTH 3 TIMES DAILY FORBLOOD PRESSURE 90 tablet 5  . hydrOXYzine (ATARAX/VISTARIL) 50 MG tablet Take 1 tablet (50 mg total) by mouth 3 (three) times daily as needed. 90 tablet 2  . levothyroxine (SYNTHROID, LEVOTHROID) 75 MCG tablet TAKE 1 TABLET BY MOUTH ONCE A DAY 30 tablet 11  . lovastatin (MEVACOR) 40 MG tablet TAKE 1 TABLET BY MOUTH EVERY NIGHT AT BEDTIME 30 tablet 11  . metoprolol (LOPRESSOR) 50 MG tablet TAKE 1 TABLET BY MOUTH TWICE A DAY AS NEEDED FOR INCREASED HEART RATE 60 tablet 5  . nitroGLYCERIN (NITROSTAT) 0.4 MG SL tablet  DISSOLVE 1 TABLET UNDER TONGUE AS NEEDEDFOR CHEST PAIN MAY REPEAT 3 TIMES 5 MINUTES APART IF NEEDED. IF NO RELIEF IN15 MINUTES CALL 911 25 tablet 2  . nortriptyline (PAMELOR) 10 MG capsule Take 1 capsule (10 mg total) by mouth at bedtime. 30 capsule 3  . nystatin (MYCOSTATIN) 100000 UNIT/ML suspension TAKE 1 TEASPOONFUL BY MOUTH 4 TIMES DAILY AS DIRECTED 60 mL 6  . nystatin cream (MYCOSTATIN) APPLY TO AFFECTED AREAS EVERY DAILY AS NEEDED 30 g 0  . oxyCODONE (ROXICODONE) 15 MG immediate release tablet Take 1 tablet (15 mg total) by mouth 3 (three) times daily. 90 tablet 0  . PATADAY 0.2 % SOLN PLACE 1 DROP INTO BOTH EYES EVERY DAY 2.5 mL 2  . pregabalin (LYRICA) 100 MG capsule Take 1 capsule (100 mg total) by mouth 3 (three) times daily. 90 capsule 0  . PROAIR HFA 108 (90 Base) MCG/ACT inhaler INHALE 2 PUFFS INTO THE LUNGS EVERY 4 HOURS AS NEEDED 8.5 g 5  . SPIRIVA HANDIHALER 18 MCG inhalation capsule INHALE CONTENTS OF 1 CAPSULE VIA HANDIHALER BY MOUTH ONCE DAILY 30 capsule 11  . topiramate (TOPAMAX) 100 MG tablet TAKE 3 TABLETS BY MOUTH ONCE DAILY 90 tablet 5  . VOLTAREN 1 % GEL APPLY 2 GM TOPICALLY 4 TIMES A DAY 100 g 5  .  zolpidem (AMBIEN) 10 MG tablet Take 1 tablet (10 mg total) by mouth at bedtime. 30 tablet 0   No current facility-administered medications on file prior to visit.     Allergies  Allergen Reactions  . Levofloxacin Hives    Past Medical History:  Diagnosis Date  . Allergy   . COPD (chronic obstructive pulmonary disease) ([place])   . Diabetes mellitus   . Gout   . Hyperlipidemia   . Hypertension   . Low back pain   . Peripheral neuropathy [facility])     Past Surgical History:  Procedure Laterality Date  . ABDOMINAL HYSTERECTOMY    . POLYPECTOMY     Colon  . TUBAL LIGATION      Family History  Problem Relation Age of Onset  . Uterine cancer Mother     Social History   Social History  . Marital status: Legally Separated    Spouse name: Perpetua Lue  . Number  of children: N/A  . Years of education: N/A   Occupational History  .  Disabled   Social History Main Topics  . Smoking status: Never Smoker  . Smokeless tobacco: Never Used  . Alcohol use No  . Drug use: No  . Sexual activity: Not on file   Other Topics Concern  . Not on file   Social History Narrative   Pt would like [name] at [phone] (nephew) to be called in case of emergency and ask him to call her daughter [name] at [phone].   The PMH, PSH, Social History, Family History, Medications, and allergies have been reviewed in [facility], and have been updated if relevant.  OBJECTIVE:  BP 124/82   Pulse 97   Temp 98.2 F (36.8 C) (Oral)   Wt 205 lb 8 oz (93.2 kg)   SpO2 96%   BMI 35.27 kg/m   She appears well, vital signs are as noted. Ears normal.  Throat and pharynx normal.  Neck supple. No adenopathy in the neck. Nose is congested. Sinuses tender. The chest is clear, without wheezes or rales.  ASSESSMENT:  sinusitis  PLAN: Given duration and progression of symptoms, will treat for bacterial sinusitis with Augmentin.  Symptomatic therapy suggested: push fluids, rest and return office visit prn if symptoms persist or worsen.Call or return to clinic prn if these symptoms worsen or fail to improve as anticipated.

## 2015-12-26 NOTE — Addendum Note (Signed)
Addended by: Modena Nunnery on: 12/26/2015 03:37 PM   Modules accepted: Orders

## 2015-12-26 NOTE — Progress Notes (Signed)
Pre visit review using our clinic review tool, if applicable. No additional management support is needed unless otherwise documented below in the visit note. 

## 2016-01-03 ENCOUNTER — Other Ambulatory Visit: Payer: Self-pay | Admitting: *Deleted

## 2016-01-03 MED ORDER — ZOLPIDEM TARTRATE 10 MG PO TABS: 10.0000 mg | ORAL_TABLET | Freq: Every day | ORAL | 0 refills | Status: DC

## 2016-01-03 NOTE — Telephone Encounter (Signed)
Last f/u 11/2015

## 2016-01-03 NOTE — Telephone Encounter (Signed)
Rx called in to requested pharmacy 

## 2016-01-04 ENCOUNTER — Other Ambulatory Visit: Payer: Self-pay | Admitting: *Deleted

## 2016-01-04 MED ORDER — PREGABALIN 100 MG PO CAPS: 100.0000 mg | ORAL_CAPSULE | Freq: Three times a day (TID) | ORAL | 0 refills | Status: DC

## 2016-01-04 NOTE — Telephone Encounter (Signed)
Medication sent electronically 

## 2016-01-04 NOTE — Telephone Encounter (Signed)
Last f/u 11/2015

## 2016-02-01 ENCOUNTER — Other Ambulatory Visit: Payer: Self-pay | Admitting: Family Medicine

## 2016-02-01 NOTE — Telephone Encounter (Signed)
Last f/u 11/2015

## 2016-02-01 NOTE — Telephone Encounter (Signed)
Rx called in to requested pharmacy 

## 2016-02-15 ENCOUNTER — Other Ambulatory Visit: Payer: Self-pay | Admitting: Family Medicine

## 2016-02-15 NOTE — Telephone Encounter (Signed)
See note from pharmacy.

## 2016-02-20 ENCOUNTER — Encounter: Payer: Self-pay | Admitting: Internal Medicine

## 2016-02-20 ENCOUNTER — Ambulatory Visit (INDEPENDENT_AMBULATORY_CARE_PROVIDER_SITE_OTHER): Payer: Medicare Other | Admitting: Internal Medicine

## 2016-02-20 ENCOUNTER — Ambulatory Visit: Payer: Medicare Other | Admitting: Family Medicine

## 2016-02-20 VITALS — BP 150/100 | HR 95 | Temp 98.6°F | Resp 18 | Wt 204.0 lb

## 2016-02-20 DIAGNOSIS — J0100 Acute maxillary sinusitis, unspecified: Secondary | ICD-10-CM | POA: Insufficient documentation

## 2016-02-20 MED ORDER — AMOXICILLIN 500 MG PO TABS: 1000.0000 mg | ORAL_TABLET | Freq: Two times a day (BID) | ORAL | 0 refills | Status: DC

## 2016-02-20 NOTE — Progress Notes (Signed)
Pre visit review using our clinic review tool, if applicable. No additional management support is needed unless otherwise documented below in the visit note. 

## 2016-02-20 NOTE — Assessment & Plan Note (Signed)
Early symptoms last week--now worse over the past 2 days Discussed supportive care--analgesics, etc Will give amoxil

## 2016-02-20 NOTE — Progress Notes (Signed)
Subjective:    Patient ID: Mariah Reed, female    DOB: 1945-02-13, 71 y.o.   MRN: [id number]  HPI Here due to respiratory symptoms  "I'm going into another sinus infection" Has sinus congestion and lots of drainage--- choking her up Swelling under eyes Allegra D helped a little Started ~2 days ago Bad headache  Low grade fevers come and go--tylenol helps Some chills one night Not SOB Is using the flonase No ear pain  Some sore throat  Current Outpatient Prescriptions on File Prior to Visit  Medication Sig Dispense Refill  . ACCU-CHEK FASTCLIX LANCETS MISC 1 Device by Does not apply route 3 (three) times daily. Use to check blood sugar up to three times a day 102 each 12  . Alum & Mag Hydroxide-Simeth (MAGIC MOUTHWASH W/LIDOCAINE) SOLN Swish spit 5 ml four times daily as needed. 240 mL 0  . BREO ELLIPTA 100-25 MCG/INH AEPB INHALE 1 PUFF INTO THE LUNGS ONCE DAILY. 60 each 0  . diphenhydrAMINE (BENADRYL) 25 MG tablet Take 25 mg by mouth as needed.      Marland Kitchen esomeprazole (NEXIUM) 40 MG capsule 1 tab by mouth daily 90 capsule 3  . estrogens, conjugated, (PREMARIN) 0.625 MG tablet Take 1 tablet (0.625 mg total) by mouth daily. Take daily for 21 days then do not take for 7 days. 30 tablet 0  . fexofenadine (ALLEGRA) 180 MG tablet Take 180 mg by mouth as needed.      . fluticasone (FLONASE) 50 MCG/ACT nasal spray INHALE 2 SPRAYS IN EACH NOSTRIL DAILY 16 g 5  . furosemide (LASIX) 20 MG tablet Take 1 tablet (20 mg total) by mouth daily as needed. 30 tablet 4  . glipiZIDE (GLUCOTROL) 5 MG tablet TAKE 1 TABLET BY MOUTH EVERY MORNING ANDTAKE 1/2 TABLET BY MOUTH EVERY AFTERNOON 45 tablet 3  . hydrALAZINE (APRESOLINE) 10 MG tablet TAKE 1 TABLET BY MOUTH 3 TIMES DAILY FORBLOOD PRESSURE 90 tablet 5  . hydrOXYzine (ATARAX/VISTARIL) 50 MG tablet Take 1 tablet (50 mg total) by mouth 3 (three) times daily as needed. 90 tablet 2  . levothyroxine (SYNTHROID, LEVOTHROID) 75 MCG tablet TAKE 1 TABLET BY  MOUTH ONCE A DAY 30 tablet 11  . lovastatin (MEVACOR) 40 MG tablet TAKE 1 TABLET BY MOUTH EVERY NIGHT AT BEDTIME 30 tablet 11  . LYRICA 100 MG capsule TAKE 1 CAPSULE BY MOUTH 3 TIMES DAILY 90 capsule 0  . metoprolol (LOPRESSOR) 50 MG tablet TAKE 1 TABLET BY MOUTH TWICE A DAY AS NEEDED FOR INCREASED HEART RATE 60 tablet 5  . nitroGLYCERIN (NITROSTAT) 0.4 MG SL tablet DISSOLVE 1 TABLET UNDER TONGUE AS NEEDEDFOR CHEST PAIN MAY REPEAT 3 TIMES 5 MINUTES APART IF NEEDED. IF NO RELIEF IN15 MINUTES CALL 911 25 tablet 2  . nortriptyline (PAMELOR) 10 MG capsule TAKE 1 CAPSULE BY MOUTH EVERY NIGHT AT BEDTIME 30 capsule 0  . nystatin (MYCOSTATIN) 100000 UNIT/ML suspension TAKE 1 TEASPOONFUL BY MOUTH 4 TIMES DAILY AS DIRECTED 60 mL 6  . nystatin cream (MYCOSTATIN) APPLY TO AFFECTED AREAS EVERY DAILY AS NEEDED 30 g 0  . oxyCODONE (ROXICODONE) 15 MG immediate release tablet Take 1 tablet (15 mg total) by mouth 3 (three) times daily. 90 tablet 0  . PATADAY 0.2 % SOLN PLACE 1 DROP INTO BOTH EYES EVERY DAY 2.5 mL 2  . PROAIR HFA 108 (90 Base) MCG/ACT inhaler INHALE 2 PUFFS INTO THE LUNGS EVERY 4 HOURS AS NEEDED 8.5 g 5  . topiramate (TOPAMAX) 100  MG tablet TAKE 3 TABLETS BY MOUTH ONCE DAILY 90 tablet 5  . VOLTAREN 1 % GEL APPLY 2 GM TOPICALLY 4 TIMES A DAY 100 g 5  . zolpidem (AMBIEN) 10 MG tablet TAKE 1 TABLET BY MOUTH AT BEDTIME 30 tablet 0   No current facility-administered medications on file prior to visit.     Allergies  Allergen Reactions  . Levofloxacin Hives    Past Medical History:  Diagnosis Date  . Allergy   . COPD (chronic obstructive pulmonary disease) ([place])   . Diabetes mellitus   . Gout   . Hyperlipidemia   . Hypertension   . Low back pain   . Peripheral neuropathy [facility])     Past Surgical History:  Procedure Laterality Date  . ABDOMINAL HYSTERECTOMY    . POLYPECTOMY     Colon  . TUBAL LIGATION      Family History  Problem Relation Age of Onset  . Uterine cancer Mother      Social History   Social History  . Marital status: Legally Separated    Spouse name: Kilah Lauro  . Number of children: N/A  . Years of education: N/A   Occupational History  .  Disabled   Social History Main Topics  . Smoking status: Never Smoker  . Smokeless tobacco: Never Used  . Alcohol use No  . Drug use: No  . Sexual activity: Not on file   Other Topics Concern  . Not on file   Social History Narrative   Pt would like [name] at [phone] (nephew) to be called in case of emergency and ask him to call her daughter [name] at [phone].   Review of Systems  No rash No vomiting or diarrhea Appetite is okay     Objective:   Physical Exam  Constitutional: She appears well-nourished. No distress.  HENT:  Mouth/Throat: Oropharynx is clear and moist. No oropharyngeal exudate.  Mild sinus tenderness Moderate nasal inflammation TMs normal  Neck: Normal range of motion. Neck supple.  Pulmonary/Chest: Effort normal and breath sounds normal. No respiratory distress. She has no wheezes. She has no rales.  Lymphadenopathy:    She has no cervical adenopathy.          Assessment & Plan:

## 2016-02-21 ENCOUNTER — Ambulatory Visit: Payer: Medicare Other | Admitting: Family Medicine

## 2016-03-05 ENCOUNTER — Other Ambulatory Visit: Payer: Self-pay | Admitting: Family Medicine

## 2016-03-06 ENCOUNTER — Other Ambulatory Visit: Payer: Self-pay | Admitting: Family Medicine

## 2016-03-06 NOTE — Telephone Encounter (Signed)
Rx called in to requested pharmacy 

## 2016-03-06 NOTE — Telephone Encounter (Signed)
Last f/u 11/2015

## 2016-03-19 ENCOUNTER — Ambulatory Visit: Payer: Medicare Other | Admitting: Family Medicine

## 2016-03-20 ENCOUNTER — Ambulatory Visit (INDEPENDENT_AMBULATORY_CARE_PROVIDER_SITE_OTHER): Payer: Medicare Other | Admitting: Family Medicine

## 2016-03-20 ENCOUNTER — Encounter: Payer: Self-pay | Admitting: Family Medicine

## 2016-03-20 VITALS — BP 136/74 | HR 72 | Temp 98.0°F | Wt 205.5 lb

## 2016-03-20 DIAGNOSIS — M5010 Cervical disc disorder with radiculopathy, unspecified cervical region: Secondary | ICD-10-CM

## 2016-03-20 DIAGNOSIS — E118 Type 2 diabetes mellitus with unspecified complications: Secondary | ICD-10-CM | POA: Diagnosis not present

## 2016-03-20 DIAGNOSIS — I10 Essential (primary) hypertension: Secondary | ICD-10-CM | POA: Diagnosis not present

## 2016-03-20 LAB — COMPREHENSIVE METABOLIC PANEL
ALBUMIN: 4.4 g/dL (ref 3.5–5.2)
ALK PHOS: 94 U/L (ref 39–117)
ALT: 22 U/L (ref 0–35)
AST: 24 U/L (ref 0–37)
BUN: 14 mg/dL (ref 6–23)
CALCIUM: 9.4 mg/dL (ref 8.4–10.5)
CHLORIDE: 106 meq/L (ref 96–112)
CO2: 28 mEq/L (ref 19–32)
CREATININE: 1.03 mg/dL (ref 0.40–1.20)
GFR: 56.06 mL/min — ABNORMAL LOW (ref >60.00)
Glucose, Bld: 180 mg/dL — ABNORMAL HIGH (ref 70–99)
Potassium: 3.8 mEq/L (ref 3.5–5.1)
SODIUM: 138 meq/L (ref 135–145)
TOTAL PROTEIN: 7.4 g/dL (ref 6.0–8.3)
Total Bilirubin: 0.3 mg/dL (ref 0.2–1.2)

## 2016-03-20 LAB — HEMOGLOBIN A1C: Hgb A1c MFr Bld: 7.9 % — ABNORMAL HIGH (ref 4.6–6.5)

## 2016-03-20 MED ORDER — OXYCODONE HCL 15 MG PO TABS: 15.0000 mg | ORAL_TABLET | Freq: Three times a day (TID) | ORAL | 0 refills | Status: DC

## 2016-03-20 NOTE — Assessment & Plan Note (Signed)
Check labs today. No changes made to rxs yet. 

## 2016-03-20 NOTE — Progress Notes (Signed)
71 yo with multiple medical problems here for follow up.   Chronic pain- cervical pain and radiculopathy worse since she cannot afford flector patchesHaving more neuropathy as well.  Oxycodone has been working.  Hypothyroidism- currently taking synthroid 75 mcg daily.  Clinically euthyroid. Lab Results  Component Value Date   TSH 3.80 11/17/2015    HLD- improved control with Mevacor 40 mg daily. Lab Results  Component Value Date   CHOL 172 03/09/2015   HDL 47.10 03/09/2015   LDLCALC 112 (H) 07/14/2014   LDLDIRECT 106.0 03/09/2015   TRIG 213.0 (H) 03/09/2015   CHOLHDL 4 03/09/2015    Lab Results  Component Value Date   ALT 17 11/17/2015   AST 23 11/17/2015   ALKPHOS 87 11/17/2015   BILITOT 0.4 11/17/2015   DM- checks FSBS 2-3 times daily.   Non fasting, ranging higher now in 200s..  Denies any episodes of hypoglycemia.  Currently taking Glucotrol 5 mg qam, 2.5 mg qpm.  She is not sure if sugars have improved. Prevnar 13 03/15/14   Lab Results  Component Value Date   HGBA1C 7.7 (H) 11/17/2015   Wt Readings from Last 3 Encounters:  03/20/16 205 lb 8 oz (93.2 kg)  02/20/16 204 lb (92.5 kg)  12/26/15 205 lb 8 oz (93.2 kg)    HTN - has been well controlled on Lopressor and Lasix.  Lab Results  Component Value Date   CREATININE 1.00 11/17/2015    Patient Active Problem List  Diagnosis  . Diabetes mellitus type 2 with complications ([place])  . HLD (hyperlipidemia)  . GOUT  . Hereditary and idiopathic peripheral neuropathy  . Essential hypertension  . LEFT BUNDLE BRANCH BLOCK  . ALLERGIC RHINITIS  . COPD  . GERD  . MYOCARDIAL INFARCTION, HX OF  . ECHOCARDIOGRAM, ABNORMAL  . Vitamin D deficiency  . Hypothyroidism  . Chronic headaches  . Family conflict  . Headache(784.0)  . Menopausal syndrome on hormone replacement therapy  . Cervical disc disorder with radiculopathy of cervical region  . OA (osteoarthritis)  . Acute non-recurrent maxillary sinusitis   Past  Medical History:  Diagnosis Date  . Allergy   . COPD (chronic obstructive pulmonary disease) ([place])   . Diabetes mellitus   . Gout   . Hyperlipidemia   . Hypertension   . Low back pain   . Peripheral neuropathy [facility])    Past Surgical History:  Procedure Laterality Date  . ABDOMINAL HYSTERECTOMY    . POLYPECTOMY     Colon  . TUBAL LIGATION     Social History  Substance Use Topics  . Smoking status: Never Smoker  . Smokeless tobacco: Never Used  . Alcohol use No   Family History  Problem Relation Age of Onset  . Uterine cancer Mother    Allergies  Allergen Reactions  . Levofloxacin Hives   Current Outpatient Prescriptions on File Prior to Visit  Medication Sig Dispense Refill  . ACCU-CHEK FASTCLIX LANCETS MISC 1 Device by Does not apply route 3 (three) times daily. Use to check blood sugar up to three times a day 102 each 12  . Alum & Mag Hydroxide-Simeth (MAGIC MOUTHWASH W/LIDOCAINE) SOLN Swish spit 5 ml four times daily as needed. 240 mL 0  . BREO ELLIPTA 100-25 MCG/INH AEPB INHALE 1 PUFF INTO THE LUNGS ONCE DAILY. 60 each 3  . diphenhydrAMINE (BENADRYL) 25 MG tablet Take 25 mg by mouth as needed.      Marland Kitchen esomeprazole (NEXIUM) 40 MG capsule 1  tab by mouth daily 90 capsule 3  . estrogens, conjugated, (PREMARIN) 0.625 MG tablet Take 1 tablet (0.625 mg total) by mouth daily. Take daily for 21 days then do not take for 7 days. 30 tablet 0  . fexofenadine (ALLEGRA) 180 MG tablet Take 180 mg by mouth as needed.      . fluticasone (FLONASE) 50 MCG/ACT nasal spray INHALE 2 SPRAYS IN EACH NOSTRIL DAILY 16 g 5  . furosemide (LASIX) 20 MG tablet Take 1 tablet (20 mg total) by mouth daily as needed. 30 tablet 4  . glipiZIDE (GLUCOTROL) 5 MG tablet TAKE 1 TABLET BY MOUTH EVERY MORNING ANDTAKE 1/2 TABLET BY MOUTH EVERY AFTERNOON 45 tablet 3  . hydrALAZINE (APRESOLINE) 10 MG tablet TAKE 1 TABLET BY MOUTH 3 TIMES DAILY FORBLOOD PRESSURE 90 tablet 5  . hydrOXYzine (ATARAX/VISTARIL) 50 MG  tablet Take 1 tablet (50 mg total) by mouth 3 (three) times daily as needed. 90 tablet 2  . levothyroxine (SYNTHROID, LEVOTHROID) 75 MCG tablet TAKE 1 TABLET BY MOUTH ONCE A DAY 30 tablet 11  . lovastatin (MEVACOR) 40 MG tablet TAKE 1 TABLET BY MOUTH EVERY NIGHT AT BEDTIME 30 tablet 11  . LYRICA 100 MG capsule TAKE 1 CAPSULE BY MOUTH 3 TIMES DAILY 90 capsule 0  . metoprolol (LOPRESSOR) 50 MG tablet TAKE 1 TABLET BY MOUTH TWICE A DAY AS NEEDED FOR INCREASED HEART RATE 60 tablet 5  . nitroGLYCERIN (NITROSTAT) 0.4 MG SL tablet DISSOLVE 1 TABLET UNDER TONGUE AS NEEDEDFOR CHEST PAIN MAY REPEAT 3 TIMES 5 MINUTES APART IF NEEDED. IF NO RELIEF IN15 MINUTES CALL 911 25 tablet 3  . nortriptyline (PAMELOR) 10 MG capsule TAKE 1 CAPSULE BY MOUTH EVERY NIGHT AT BEDTIME 30 capsule 3  . nystatin (MYCOSTATIN) 100000 UNIT/ML suspension TAKE 1 TEASPOONFUL BY MOUTH 4 TIMES DAILY AS DIRECTED 60 mL 6  . nystatin cream (MYCOSTATIN) APPLY TO AFFECTED AREAS EVERY DAILY AS NEEDED 30 g 0  . PATADAY 0.2 % SOLN PLACE 1 DROP INTO BOTH EYES EVERY DAY 2.5 mL 2  . PROAIR HFA 108 (90 Base) MCG/ACT inhaler INHALE 2 PUFFS INTO THE LUNGS EVERY 4 HOURS AS NEEDED 8.5 g 5  . topiramate (TOPAMAX) 100 MG tablet TAKE 3 TABLETS BY MOUTH ONCE DAILY 90 tablet 5  . VOLTAREN 1 % GEL APPLY 2 GM TOPICALLY 4 TIMES A DAY 100 g 5  . zolpidem (AMBIEN) 10 MG tablet TAKE 1 TABLET BY MOUTH AT BEDTIME 30 tablet 3   No current facility-administered medications on file prior to visit.      The PMH, PSH, Social History, Family History, Medications, and allergies have been reviewed in [facility], and have been updated if relevant.     Review of Systems  Constitutional: Positive for fatigue.  HENT: Negative.   Eyes: Negative.   Respiratory: Negative.   Cardiovascular: Negative.   Gastrointestinal: Negative.   Endocrine: Negative.   Musculoskeletal: Positive for arthralgias, back pain, joint swelling, myalgias, neck pain and neck stiffness.  Skin:  Negative.   Allergic/Immunologic: Negative.   Neurological: Negative.   Hematological: Negative.   Psychiatric/Behavioral: Negative.   All other systems reviewed and are negative.    Physical Exam BP 136/74   Pulse 72   Temp 98 F (36.7 C) (Oral)   Wt 205 lb 8 oz (93.2 kg)   SpO2 98%   BMI 35.27 kg/m   Physical Exam  Constitutional: She is oriented to person, place, and time. She appears well-developed and well-nourished. No  distress.  HENT:  Head: Normocephalic.  Eyes: Conjunctivae and EOM are normal.  Cardiovascular: Normal rate and regular rhythm.   Pulmonary/Chest: Effort normal.  Neurological: She is alert and oriented to person, place, and time. No cranial nerve deficit.  Skin: Skin is warm and dry.  Psychiatric: She has a normal mood and affect. Her behavior is normal. Judgment and thought content normal.  Nursing note and vitals reviewed.

## 2016-03-20 NOTE — Progress Notes (Signed)
Pre visit review using our clinic review tool, if applicable. No additional management support is needed unless otherwise documented below in the visit note. 

## 2016-03-20 NOTE — Assessment & Plan Note (Signed)
Stable.  No changes made. 

## 2016-03-20 NOTE — Patient Instructions (Signed)
Great to see you.  Let's check your blood work today and I will call you with your results.

## 2016-03-20 NOTE — Assessment & Plan Note (Signed)
Stable on current rxs. Rxs printed and given to pt.

## 2016-03-22 ENCOUNTER — Other Ambulatory Visit: Payer: Self-pay | Admitting: Family Medicine

## 2016-03-22 MED ORDER — GLIPIZIDE 10 MG PO TABS: 10.0000 mg | ORAL_TABLET | Freq: Two times a day (BID) | ORAL | 3 refills | Status: DC

## 2016-03-22 NOTE — Progress Notes (Signed)
Dose increased and eRx sent.  Please keep a close eye on blood sugars- looking for low blood sugars.

## 2016-03-27 ENCOUNTER — Ambulatory Visit (INDEPENDENT_AMBULATORY_CARE_PROVIDER_SITE_OTHER): Payer: Medicare Other | Admitting: Primary Care

## 2016-03-27 VITALS — BP 146/94 | HR 108 | Temp 98.6°F | Wt 203.8 lb

## 2016-03-27 DIAGNOSIS — J069 Acute upper respiratory infection, unspecified: Secondary | ICD-10-CM | POA: Diagnosis not present

## 2016-03-27 DIAGNOSIS — B9789 Other viral agents as the cause of diseases classified elsewhere: Secondary | ICD-10-CM | POA: Diagnosis not present

## 2016-03-27 DIAGNOSIS — J302 Other seasonal allergic rhinitis: Secondary | ICD-10-CM

## 2016-03-27 LAB — POCT RAPID STREP A (OFFICE): RAPID STREP A SCREEN: NEGATIVE

## 2016-03-27 NOTE — Patient Instructions (Signed)
Your symptoms are representative of a viral/allergy illness which will resolve on its own over time. Our goal is to treat your symptoms in order to aid your body in the healing process and to make you more comfortable.   Continue cough drops and Flonase nasal spray.  Consider switching to zyrtec from allegra.   Please notify me if you develop persistent fevers of 101, start coughing up green mucous, notice increased fatigue or weakness, or feel worse after 1 week of onset of symptoms.   Increase consumption of water intake and rest.  It was a pleasure meeting you!

## 2016-03-27 NOTE — Progress Notes (Signed)
Subjective:    Patient ID: Mariah Reed, female    DOB: 08-Mar-1945, 71 y.o.   MRN: [id number]  HPI  Ms. Syslo is a 71 year old female with a history of COPD, allergic rhinitis who presents today with a chief complaint of nasal congestion. She also reports cough, sore throat. Her symptoms began 4 days ago. She's been taking OTC cough syrup with mucous relief and OTC allergy medications (Allegra-D, benadryl), Flonase, and cough drops with temporary improvement. She denies fevers, productive cough.   She was last treated with Amoxil course in January 2018 for acute sinusitis.   Review of Systems  Constitutional: Positive for fatigue. Negative for chills and fever.  HENT: Positive for congestion and sore throat. Negative for ear pain and sinus pressure.   Respiratory: Positive for cough. Negative for shortness of breath and wheezing.   Cardiovascular: Negative for chest pain.       Past Medical History:  Diagnosis Date  . Allergy   . COPD (chronic obstructive pulmonary disease) ([place])   . Diabetes mellitus   . Gout   . Hyperlipidemia   . Hypertension   . Low back pain   . Peripheral neuropathy [facility])      Social History   Social History  . Marital status: Legally Separated    Spouse name: Floride Cravotta  . Number of children: N/A  . Years of education: N/A   Occupational History  .  Disabled   Social History Main Topics  . Smoking status: Never Smoker  . Smokeless tobacco: Never Used  . Alcohol use No  . Drug use: No  . Sexual activity: Not on file   Other Topics Concern  . Not on file   Social History Narrative   Pt would like [name] at [phone] (nephew) to be called in case of emergency and ask him to call her daughter [name] at [phone].    Past Surgical History:  Procedure Laterality Date  . ABDOMINAL HYSTERECTOMY    . POLYPECTOMY     Colon  . TUBAL LIGATION      Family History  Problem Relation Age of Onset  . Uterine  cancer Mother     Allergies  Allergen Reactions  . Levofloxacin Hives    Current Outpatient Prescriptions on File Prior to Visit  Medication Sig Dispense Refill  . ACCU-CHEK FASTCLIX LANCETS MISC 1 Device by Does not apply route 3 (three) times daily. Use to check blood sugar up to three times a day 102 each 12  . Alum & Mag Hydroxide-Simeth (MAGIC MOUTHWASH W/LIDOCAINE) SOLN Swish spit 5 ml four times daily as needed. 240 mL 0  . BREO ELLIPTA 100-25 MCG/INH AEPB INHALE 1 PUFF INTO THE LUNGS ONCE DAILY. 60 each 3  . diphenhydrAMINE (BENADRYL) 25 MG tablet Take 25 mg by mouth as needed.      Marland Kitchen esomeprazole (NEXIUM) 40 MG capsule 1 tab by mouth daily 90 capsule 3  . estrogens, conjugated, (PREMARIN) 0.625 MG tablet Take 1 tablet (0.625 mg total) by mouth daily. Take daily for 21 days then do not take for 7 days. 30 tablet 0  . fexofenadine (ALLEGRA) 180 MG tablet Take 180 mg by mouth as needed.      . fluticasone (FLONASE) 50 MCG/ACT nasal spray INHALE 2 SPRAYS IN EACH NOSTRIL DAILY 16 g 5  . furosemide (LASIX) 20 MG tablet Take 1 tablet (20 mg total) by mouth daily as needed. 30 tablet 4  .  glipiZIDE (GLUCOTROL) 10 MG tablet Take 1 tablet (10 mg total) by mouth 2 (two) times daily before a meal. 60 tablet 3  . glipiZIDE (GLUCOTROL) 5 MG tablet TAKE 1 TABLET BY MOUTH EVERY MORNING ANDTAKE 1/2 TABLET BY MOUTH EVERY AFTERNOON 45 tablet 3  . hydrALAZINE (APRESOLINE) 10 MG tablet TAKE 1 TABLET BY MOUTH 3 TIMES DAILY FORBLOOD PRESSURE 90 tablet 5  . hydrOXYzine (ATARAX/VISTARIL) 50 MG tablet Take 1 tablet (50 mg total) by mouth 3 (three) times daily as needed. 90 tablet 2  . levothyroxine (SYNTHROID, LEVOTHROID) 75 MCG tablet TAKE 1 TABLET BY MOUTH ONCE A DAY 30 tablet 11  . lovastatin (MEVACOR) 40 MG tablet TAKE 1 TABLET BY MOUTH EVERY NIGHT AT BEDTIME 30 tablet 11  . LYRICA 100 MG capsule TAKE 1 CAPSULE BY MOUTH 3 TIMES DAILY 90 capsule 0  . metoprolol (LOPRESSOR) 50 MG tablet TAKE 1 TABLET BY  MOUTH TWICE A DAY AS NEEDED FOR INCREASED HEART RATE 60 tablet 5  . nitroGLYCERIN (NITROSTAT) 0.4 MG SL tablet DISSOLVE 1 TABLET UNDER TONGUE AS NEEDEDFOR CHEST PAIN MAY REPEAT 3 TIMES 5 MINUTES APART IF NEEDED. IF NO RELIEF IN15 MINUTES CALL 911 25 tablet 3  . nortriptyline (PAMELOR) 10 MG capsule TAKE 1 CAPSULE BY MOUTH EVERY NIGHT AT BEDTIME 30 capsule 3  . nystatin (MYCOSTATIN) 100000 UNIT/ML suspension TAKE 1 TEASPOONFUL BY MOUTH 4 TIMES DAILY AS DIRECTED 60 mL 6  . nystatin cream (MYCOSTATIN) APPLY TO AFFECTED AREAS EVERY DAILY AS NEEDED 30 g 0  . oxyCODONE (ROXICODONE) 15 MG immediate release tablet Take 1 tablet (15 mg total) by mouth 3 (three) times daily. 90 tablet 0  . PATADAY 0.2 % SOLN PLACE 1 DROP INTO BOTH EYES EVERY DAY 2.5 mL 2  . PROAIR HFA 108 (90 Base) MCG/ACT inhaler INHALE 2 PUFFS INTO THE LUNGS EVERY 4 HOURS AS NEEDED 8.5 g 5  . topiramate (TOPAMAX) 100 MG tablet TAKE 3 TABLETS BY MOUTH ONCE DAILY 90 tablet 5  . VOLTAREN 1 % GEL APPLY 2 GM TOPICALLY 4 TIMES A DAY 100 g 5  . zolpidem (AMBIEN) 10 MG tablet TAKE 1 TABLET BY MOUTH AT BEDTIME 30 tablet 3   No current facility-administered medications on file prior to visit.     BP (!) 146/94   Pulse (!) 108   Temp 98.6 F (37 C) (Oral)   Wt 203 lb 12.8 oz (92.4 kg)   SpO2 98%   BMI 34.98 kg/m    Objective:   Physical Exam  Constitutional: She appears well-nourished.  HENT:  Right Ear: Tympanic membrane and ear canal normal.  Left Ear: Tympanic membrane and ear canal normal.  Nose: Right sinus exhibits no maxillary sinus tenderness and no frontal sinus tenderness. Left sinus exhibits no maxillary sinus tenderness and no frontal sinus tenderness.  Mouth/Throat: Oropharynx is clear and moist.  Eyes: Conjunctivae are normal.  Neck: Neck supple.  Cardiovascular: Normal rate and regular rhythm.   Pulmonary/Chest: Effort normal and breath sounds normal. She has no wheezes. She has no rales.  Lymphadenopathy:    She  has no cervical adenopathy.  Skin: Skin is warm and dry.          Assessment & Plan:  URI:  Nasal congestion, cough, fatigue x 4 days. Temporary improvement on OTC treatment. Exam today with clear lungs, does not appear acutely ill, no nasal mucosal edema or sinus tenderness. Suspect viral URI and will treat with conservative measures. She declines an Rx  for Tessalon pearls. Continue antihistamine, nasal sprays, fluids, pain medication. Fluids, rest, return precautions provided.  Sheral Flow, NP

## 2016-03-27 NOTE — Progress Notes (Signed)
Pre visit review using our clinic review tool, if applicable. No additional management support is needed unless otherwise documented below in the visit note. 

## 2016-03-27 NOTE — Addendum Note (Signed)
Addended by: Jacqualin Combes on: 03/27/2016 09:29 AM   Modules accepted: Orders

## 2016-04-02 ENCOUNTER — Other Ambulatory Visit: Payer: Self-pay | Admitting: Family Medicine

## 2016-04-02 MED ORDER — ALBUTEROL SULFATE HFA 108 (90 BASE) MCG/ACT IN AERS: 2.0000 | INHALATION_SPRAY | RESPIRATORY_TRACT | 2 refills | Status: DC | PRN

## 2016-04-02 NOTE — Telephone Encounter (Signed)
Lyrica last filled 03-06-16 #90 Last OV 03-06-16 Next OV 06-05-16  We already called in the zolpidem this morning

## 2016-04-02 NOTE — Telephone Encounter (Signed)
Zolpidem Left refill on voice mail at pharmacy

## 2016-04-02 NOTE — Telephone Encounter (Signed)
Left refill on voice mail at pharmacy  

## 2016-04-02 NOTE — Telephone Encounter (Signed)
Zolpidem last filled 03-06-16 #30 Last OV 03-06-16 Next OV 06-05-16  I built the others to go ahead once the zolpidem is approved

## 2016-04-03 ENCOUNTER — Telehealth: Payer: Self-pay | Admitting: *Deleted

## 2016-04-03 NOTE — Telephone Encounter (Signed)
Received fax from [place].  Called and confirmed with Ms. Stopper that she did request supplies from this company. Form completed and placed in Dr. Hulen Shouts in box for signature.

## 2016-05-02 ENCOUNTER — Other Ambulatory Visit: Payer: Self-pay | Admitting: Family Medicine

## 2016-05-02 NOTE — Telephone Encounter (Signed)
Called pharmacy for RX

## 2016-05-02 NOTE — Telephone Encounter (Signed)
Pt last seen 03/20/2016....last refill 04/02/2016 . Please advise

## 2016-05-11 NOTE — Telephone Encounter (Signed)
[facility] left v/m requesting cb about diabetic supplies. I called back and spoke with Gerald Stabs and she said someone would call me back about supplies.

## 2016-05-18 NOTE — Telephone Encounter (Addendum)
Mariah Reed with [facility] said received fax on 05/14/16 and has marked can test one time a day and 3 x a day. I advised per med list pt can test tid. Shannon at [facility] said has to be on form. Will fax form back to [facility] for correction.

## 2016-05-18 NOTE — Telephone Encounter (Signed)
Pt left v/m and request cb why her diabetic testing was decreased to 1 x a day per [facility] supply.

## 2016-05-19 NOTE — Telephone Encounter (Signed)
Mariah Reed, please help with this.  Thanks so much!

## 2016-05-21 NOTE — Telephone Encounter (Signed)
Left message to call office. Because she is not on insulin, Medicare will not cover more than 1 a day testing, even if the doctor recommends extra testing due to higher or lower than normal readings.

## 2016-05-21 NOTE — Telephone Encounter (Signed)
Pt returned your call and will be avail all day.

## 2016-05-21 NOTE — Telephone Encounter (Signed)
Spoke to pt. She was upset but said she understood.

## 2016-05-28 ENCOUNTER — Other Ambulatory Visit: Payer: Self-pay | Admitting: Family Medicine

## 2016-05-28 NOTE — Telephone Encounter (Signed)
Left refills on voice mail at pharmacy  

## 2016-05-28 NOTE — Telephone Encounter (Signed)
Both Zolpidem and Lyrica last filled 05-02-16 Last OV 03-20-16 for DM No Future OV

## 2016-06-05 ENCOUNTER — Ambulatory Visit (INDEPENDENT_AMBULATORY_CARE_PROVIDER_SITE_OTHER): Payer: Medicare Other | Admitting: Family Medicine

## 2016-06-05 ENCOUNTER — Encounter: Payer: Self-pay | Admitting: Family Medicine

## 2016-06-05 VITALS — BP 146/90 | HR 83 | Temp 98.3°F | Wt 206.0 lb

## 2016-06-05 DIAGNOSIS — E785 Hyperlipidemia, unspecified: Secondary | ICD-10-CM | POA: Diagnosis not present

## 2016-06-05 DIAGNOSIS — I10 Essential (primary) hypertension: Secondary | ICD-10-CM

## 2016-06-05 DIAGNOSIS — E038 Other specified hypothyroidism: Secondary | ICD-10-CM | POA: Diagnosis not present

## 2016-06-05 DIAGNOSIS — E118 Type 2 diabetes mellitus with unspecified complications: Secondary | ICD-10-CM | POA: Diagnosis not present

## 2016-06-05 DIAGNOSIS — M5010 Cervical disc disorder with radiculopathy, unspecified cervical region: Secondary | ICD-10-CM

## 2016-06-05 DIAGNOSIS — J329 Chronic sinusitis, unspecified: Secondary | ICD-10-CM | POA: Insufficient documentation

## 2016-06-05 MED ORDER — AMOXICILLIN-POT CLAVULANATE 875-125 MG PO TABS: 1.0000 | ORAL_TABLET | Freq: Two times a day (BID) | ORAL | 0 refills | Status: DC

## 2016-06-05 MED ORDER — OXYCODONE HCL 15 MG PO TABS: 15.0000 mg | ORAL_TABLET | Freq: Three times a day (TID) | ORAL | 0 refills | Status: DC

## 2016-06-05 MED ORDER — GLIPIZIDE 10 MG PO TABS: 10.0000 mg | ORAL_TABLET | Freq: Two times a day (BID) | ORAL | 3 refills | Status: DC

## 2016-06-05 NOTE — Progress Notes (Signed)
Pre visit review using our clinic review tool, if applicable. No additional management support is needed unless otherwise documented below in the visit note. 

## 2016-06-05 NOTE — Progress Notes (Signed)
71 yo with multiple medical problems here for follow up.  Thinks she has another sinus infection- progressive congestion, sinus pressure, fatigue, low grade fever for past 10 days.   Hypothyroidism- currently taking synthroid 75 mcg daily.  Clinically euthyroid. Lab Results  Component Value Date   TSH 3.80 11/17/2015    HLD- improved control with Mevacor 40 mg daily. Lab Results  Component Value Date   CHOL 172 03/09/2015   HDL 47.10 03/09/2015   LDLCALC 112 (H) 07/14/2014   LDLDIRECT 106.0 03/09/2015   TRIG 213.0 (H) 03/09/2015   CHOLHDL 4 03/09/2015    Lab Results  Component Value Date   ALT 22 03/20/2016   AST 24 03/20/2016   ALKPHOS 94 03/20/2016   BILITOT 0.3 03/20/2016   DM- checks FSBS 2-3 times daily.   Non fasting, ranging higher now in 200s- 300s but has been drinking more cranberry juice.  Denies any episodes of hypoglycemia.  Currently taking Glucotrol 10 mg twice daily.  Prevnar 13 03/15/14   Lab Results  Component Value Date   HGBA1C 7.9 (H) 03/20/2016   Wt Readings from Last 3 Encounters:  06/05/16 206 lb (93.4 kg)  03/27/16 203 lb 12.8 oz (92.4 kg)  03/20/16 205 lb 8 oz (93.2 kg)    HTN - has been well controlled on Lopressor and Lasix.  Lab Results  Component Value Date   CREATININE 1.03 03/20/2016    Patient Active Problem List   Diagnosis Date Noted  . OA (osteoarthritis) 07/11/2015  . Cervical disc disorder with radiculopathy of cervical region 11/09/2013  . Menopausal syndrome on hormone replacement therapy 07/14/2013  . Headache(784.0) 10/29/2012  . Family conflict 50/53/9767  . Chronic headaches 04/03/2012  . Hypothyroidism 09/18/2010  . Vitamin D deficiency 06/14/2010  . ECHOCARDIOGRAM, ABNORMAL 03/16/2010  . MYOCARDIAL INFARCTION, HX OF 02/09/2010  . LEFT BUNDLE BRANCH BLOCK 12/26/2009  . GERD 12/26/2009  . COPD 11/10/2009  . Diabetes mellitus type 2 with complications ([place]) 34/19/3790  . HLD (hyperlipidemia) 03/12/2006  .  GOUT 03/12/2006  . Hereditary and idiopathic peripheral neuropathy 03/12/2006  . Essential hypertension 03/12/2006  . ALLERGIC RHINITIS 03/12/2006   Past Medical History:  Diagnosis Date  . Allergy   . COPD (chronic obstructive pulmonary disease) ([place])   . Diabetes mellitus   . Gout   . Hyperlipidemia   . Hypertension   . Low back pain   . Peripheral neuropathy    Past Surgical History:  Procedure Laterality Date  . ABDOMINAL HYSTERECTOMY    . POLYPECTOMY     Colon  . TUBAL LIGATION     Social History  Substance Use Topics  . Smoking status: Never Smoker  . Smokeless tobacco: Never Used  . Alcohol use No   Family History  Problem Relation Age of Onset  . Uterine cancer Mother    Allergies  Allergen Reactions  . Levofloxacin Hives   Current Outpatient Prescriptions on File Prior to Visit  Medication Sig Dispense Refill  . ACCU-CHEK FASTCLIX LANCETS MISC 1 Device by Does not apply route 3 (three) times daily. Use to check blood sugar up to three times a day 102 each 12  . albuterol (PROVENTIL HFA;VENTOLIN HFA) 108 (90 Base) MCG/ACT inhaler Inhale 2 puffs into the lungs every 4 (four) hours as needed for wheezing or shortness of breath. Please provide her with Ventolin due to insurance 1 Inhaler 2  . Alum & Mag Hydroxide-Simeth (MAGIC MOUTHWASH W/LIDOCAINE) SOLN Swish spit 5 ml four times daily  as needed. 240 mL 0  . BREO ELLIPTA 100-25 MCG/INH AEPB INHALE 1 PUFF INTO THE LUNGS ONCE DAILY. 60 each 3  . diphenhydrAMINE (BENADRYL) 25 MG tablet Take 25 mg by mouth as needed.      Marland Kitchen esomeprazole (NEXIUM) 40 MG capsule 1 tab by mouth daily 90 capsule 3  . estrogens, conjugated, (PREMARIN) 0.625 MG tablet Take 1 tablet (0.625 mg total) by mouth daily. Take daily for 21 days then do not take for 7 days. 30 tablet 0  . fexofenadine (ALLEGRA) 180 MG tablet Take 180 mg by mouth as needed.      . fluticasone (FLONASE) 50 MCG/ACT nasal spray INHALE 2 SPRAYS IN EACH NOSTRIL DAILY 16 g  11  . furosemide (LASIX) 20 MG tablet Take 1 tablet (20 mg total) by mouth daily as needed. 30 tablet 4  . glipiZIDE (GLUCOTROL) 10 MG tablet Take 1 tablet (10 mg total) by mouth 2 (two) times daily before a meal. 60 tablet 3  . hydrALAZINE (APRESOLINE) 10 MG tablet TAKE 1 TABLET BY MOUTH 3 TIMES DAILY FORBLOOD PRESSURE 90 tablet 5  . hydrOXYzine (ATARAX/VISTARIL) 50 MG tablet Take 1 tablet (50 mg total) by mouth 3 (three) times daily as needed. 90 tablet 2  . levothyroxine (SYNTHROID, LEVOTHROID) 75 MCG tablet TAKE 1 TABLET BY MOUTH ONCE A DAY 30 tablet 11  . lovastatin (MEVACOR) 40 MG tablet TAKE 1 TABLET BY MOUTH EVERY NIGHT AT BEDTIME 30 tablet 11  . LYRICA 100 MG capsule TAKE 1 CAPSULE BY MOUTH 3 TIMES DAILY 90 capsule 0  . metoprolol (LOPRESSOR) 50 MG tablet TAKE 1 TABLET BY MOUTH TWICE A DAY AS NEEDED FOR INCREASED HEART RATE 60 tablet 5  . nitroGLYCERIN (NITROSTAT) 0.4 MG SL tablet DISSOLVE 1 TABLET UNDER TONGUE AS NEEDEDFOR CHEST PAIN MAY REPEAT 3 TIMES 5 MINUTES APART IF NEEDED. IF NO RELIEF IN15 MINUTES CALL 911 25 tablet 3  . nortriptyline (PAMELOR) 10 MG capsule TAKE 1 CAPSULE BY MOUTH EVERY NIGHT AT BEDTIME 30 capsule 3  . nystatin (MYCOSTATIN) 100000 UNIT/ML suspension TAKE 1 TEASPOONFUL BY MOUTH 4 TIMES DAILY AS DIRECTED 60 mL 6  . nystatin cream (MYCOSTATIN) APPLY TO AFFECTED AREAS ONCE DAILY AS NEEDED 30 g 0  . oxyCODONE (ROXICODONE) 15 MG immediate release tablet Take 1 tablet (15 mg total) by mouth 3 (three) times daily. 90 tablet 0  . PATADAY 0.2 % SOLN PLACE 1 DROP INTO BOTH EYES EVERY DAY 2.5 mL 2  . PROAIR HFA 108 (90 Base) MCG/ACT inhaler INHALE 2 PUFFS INTO THE LUNGS EVERY 4 HOURS AS NEEDED 8.5 g 5  . topiramate (TOPAMAX) 100 MG tablet TAKE 3 TABLETS BY MOUTH ONCE DAILY 90 tablet 5  . VOLTAREN 1 % GEL APPLY 2 GM TOPICALLY 4 TIMES A DAY 100 g 3  . zolpidem (AMBIEN) 10 MG tablet TAKE 1 TABLET BY MOUTH DAILY AT BEDTIME 30 tablet 0   No current facility-administered  medications on file prior to visit.      The PMH, PSH, Social History, Family History, Medications, and allergies have been reviewed in [facility], and have been updated if relevant.     Review of Systems  Constitutional: Positive for fatigue.  HENT: Positive for congestion and sinus pressure.   Eyes: Negative.   Respiratory: Negative.   Cardiovascular: Negative.   Gastrointestinal: Negative.   Endocrine: Negative.   Musculoskeletal: Positive for myalgias. Negative for arthralgias, back pain, joint swelling, neck pain and neck stiffness.  Skin: Negative.  Allergic/Immunologic: Negative.   Neurological: Negative.   Hematological: Negative.   Psychiatric/Behavioral: Negative.   All other systems reviewed and are negative.    Physical Exam BP (!) 146/90 (BP Location: Right Arm, Patient Position: Sitting, Cuff Size: Large)   Pulse 83   Temp 98.3 F (36.8 C) (Oral)   Wt 206 lb (93.4 kg)   SpO2 96%   BMI 35.36 kg/m   Physical Exam  Constitutional: She is oriented to person, place, and time. She appears well-developed and well-nourished. No distress.  HENT:  Head: Normocephalic.  Nose: Mucosal edema present. Right sinus exhibits frontal sinus tenderness. Left sinus exhibits frontal sinus tenderness.  Eyes: Conjunctivae and EOM are normal.  Cardiovascular: Normal rate and regular rhythm.   Pulmonary/Chest: Effort normal.  Neurological: She is alert and oriented to person, place, and time. No cranial nerve deficit.  Skin: Skin is warm and dry.  Psychiatric: She has a normal mood and affect. Her behavior is normal. Judgment and thought content normal.  Nursing note and vitals reviewed.

## 2016-06-05 NOTE — Patient Instructions (Signed)
Great to see you.  Please take Augmentin as directed- 1 tablet twice daily x 10 days. 

## 2016-06-05 NOTE — Assessment & Plan Note (Signed)
On narcotics. Pt reviewed in [place] controlled substances data base and rxs printed -3 at one time and given to pt.

## 2016-06-05 NOTE — Assessment & Plan Note (Signed)
Reasonable control. 

## 2016-06-05 NOTE — Assessment & Plan Note (Signed)
Deteriorated but has been drinking more cranberry juice which is high in sugar.  Pt was not aware.  She will cut back on this. No changes made to rxs today.

## 2016-06-05 NOTE — Assessment & Plan Note (Signed)
Given duration and progression of symptoms, will treat for bacterial sinusitis with Augmentin.

## 2016-06-11 ENCOUNTER — Telehealth: Payer: Self-pay | Admitting: Family Medicine

## 2016-06-11 NOTE — Telephone Encounter (Signed)
Pt declined AWV. °

## 2016-06-25 ENCOUNTER — Other Ambulatory Visit: Payer: Self-pay | Admitting: Family Medicine

## 2016-06-25 NOTE — Telephone Encounter (Signed)
Called into [facility] [place] [address] [phone]  

## 2016-06-25 NOTE — Telephone Encounter (Signed)
AMBIEN last refilled on 05/28/16 #30. Last OV 06/05/16 Lyrica last refilled on 05/28/16 #90

## 2016-06-28 ENCOUNTER — Telehealth: Payer: Self-pay

## 2016-06-28 DIAGNOSIS — E1165 Type 2 diabetes mellitus with hyperglycemia: Secondary | ICD-10-CM

## 2016-06-28 MED ORDER — SITAGLIPTIN PHOSPHATE 100 MG PO TABS: 100.0000 mg | ORAL_TABLET | Freq: Every day | ORAL | 3 refills | Status: DC

## 2016-06-28 NOTE — Telephone Encounter (Signed)
Spoke with patient she agreed to add additional medication.

## 2016-06-28 NOTE — Telephone Encounter (Signed)
Pt was notified of instructions, pt verbalized understanding.   

## 2016-06-28 NOTE — Telephone Encounter (Signed)
The maximum dose she should be taking of glipizide is 10 mg twice daily.  20 mg twice daily is way too high so I cannot change that rx.  If she is taking that much, we need to add an additional medication.

## 2016-06-28 NOTE — Telephone Encounter (Signed)
Januvia eRx sent to [facility].  Please keep a close check on FSBS at home.

## 2016-06-28 NOTE — Telephone Encounter (Signed)
Pt left v/m; pt last seen 06/05/16 and pt understands that pts sugar is deteriorating and now "pt taking glipizide 10 mg taking 2 tabs before eats"; med list has glipizide 10 mg one tab bid before meals. Pt request increase dosage of glipizide so medicare will pay. Now pt is presently paying out of pocket to get extra glipizide. Pt request cb.

## 2016-07-03 ENCOUNTER — Telehealth: Payer: Self-pay | Admitting: *Deleted

## 2016-07-03 MED ORDER — AMOXICILLIN-POT CLAVULANATE 875-125 MG PO TABS: 1.0000 | ORAL_TABLET | Freq: Two times a day (BID) | ORAL | 0 refills | Status: DC

## 2016-07-03 NOTE — Telephone Encounter (Signed)
Patient called stating that she has the same infection now that you saw her for at the first of the month. Patient stated that she has a headache, head congestion and low grade fever. Patient stated that she got some better and then got sick again after finishing antibiotic and does not feel that she got completely over it. Patient stated that she is too tired and feels so bad to get dressed and come back in to be seen. Patient stated that you know her history and requested that you send her in another antibiotic to try to get rid of this infection.Patient requested a call back. Pharmacy [facility]

## 2016-07-03 NOTE — Telephone Encounter (Signed)
eRx sent

## 2016-07-09 NOTE — Telephone Encounter (Signed)
Tried to explain to pt that her glipizide is only for twice a day but she said that her sugar keeps going up throughout the day she said before every every meal she takes 2 tablets of glipizide or by the end of the day sugar would be 320s to 400, she said the [facility] "isn't doing anything for her".

## 2016-07-09 NOTE — Telephone Encounter (Signed)
Pt left v/m requesting cb to clarify how to take current diabetic meds.

## 2016-07-09 NOTE — Telephone Encounter (Signed)
If current rxs are not working, we need to either add Insulin or refer to endocrinology

## 2016-07-09 NOTE — Telephone Encounter (Signed)
FSBS of 160- 180 are actually pretty good.  I would not change anything yet and continue current rxs.  Call back in a 1-2 weeks with an update of blood sugar readings.

## 2016-07-09 NOTE — Telephone Encounter (Signed)
Pt left v/m; pt started Januvia 11 days ago and pt does not feel this med is working at all. FBS is 160 - 180. Taking [place] once a day and glipizide 10 mg taking 8-9 daily. (advised pt per this note Dr Deborra Medina said the max dose of glipizide should be 10 mg twice a day and pt said she knew that but it was not working so she increased the med.) Pt wants different med to [facility]. Pt request cb after Dr Deborra Medina Reviews.

## 2016-07-10 ENCOUNTER — Telehealth: Payer: Self-pay | Admitting: Family Medicine

## 2016-07-10 MED ORDER — INSULIN GLARGINE 100 UNIT/ML SOLOSTAR PEN: 10.0000 [IU] | PEN_INJECTOR | Freq: Every day | SUBCUTANEOUS | 11 refills | Status: DC

## 2016-07-10 NOTE — Telephone Encounter (Signed)
This is poor medical management and I am not comfortable managing her diabetes at this point.  Mandy, please call pt to let her know that I am referring her to an endocrinologist as this is now outside my scope of practice.

## 2016-07-10 NOTE — Telephone Encounter (Signed)
Patient said if she can be given the new pen that measures the insulin, she'll start using insulin.  Patient uses [facility].

## 2016-07-10 NOTE — Addendum Note (Signed)
Addended by: Lucille Passy on: 07/10/2016 02:46 PM   Modules accepted: Orders

## 2016-07-10 NOTE — Telephone Encounter (Signed)
Lantus pen eRx sent to pharmacy- directions- 10 units at bedtime.  Please continue tracking sugars and keep [place] updated.

## 2016-07-10 NOTE — Telephone Encounter (Signed)
Spoke with pt she said she does not want insulin or go see a specialist, she said she would like for "her pills to be increased and or start a new rx" "like metformin". She said she "will take her meds as prescribed and if she goes into a come so be it". Please advise.

## 2016-07-11 NOTE — Telephone Encounter (Signed)
Your concerns are the same as mine, which is why I have repeatedly tried to refer her to endocrinology and wanted you to get involved as our nurse lead RN.  Yes currently, those are my recommendations as you listed them- d/c januvia, continue glipizide (NOT AT THE HIGH DOSES SHE IS SELF MEDICATING WITH), and lantus and very much encourage her to proceed with seeing endocrinology.  Thanks for all you do!

## 2016-07-11 NOTE — Telephone Encounter (Signed)
Thank you for taking so much time with this patient.  She has been quite difficult to manage.  We can certainly give it another 3 months if she agrees to comply with her medication doses and instructions and repeat an a1c at that time. I too have the same concerns about this patient that you do, Mariah Reed.

## 2016-07-11 NOTE — Telephone Encounter (Signed)
Spoke with patient at length regarding her new lantus pen order and reviewed current orders to:  1.  D/c the Januvia 2.  Take ONLY Glipizide 10mg  2 times per day * DO NOT take any additional pills on her own as this is very dangerous.  3.  Reviewed Lantus 10 units at bedtime administration. 4. Reviewed expectations moving forward for safe, proper management regarding her diabetes care and referral to endocrinology.  NOTE:   Patient was very confused over how to use the Lantus Solostar pen and thought 1 whole pen equaled 1 whole dose.  She was not understanding instructions over the phone and I insisted that she come in and meet with me tomorrow so I could do proper insulin administration teaching.    I informed her that self medicating with any prescription meds and IN PARTICULAR with diabetes medications is highly unsafe and that we cannot continue to manage her care if she continues to do this.  Patient did not understand why what she did was so dangerous and actually stated that we "made her do that" because we gave her such a hard time by not letting her increase her pills as she first requested.  She keeps saying that she has been fighting this battle with doctors for 50 years about "letting her take more pills to control her sugar".    I explained that this is not how medication management works.  The doctor can only prescribe the medications and qty of medications that are safely recommended.  They cannot just let you take as many pills as you want.    I am worried that patient may self medicate with her insulin to dial in however many units "she thinks" is what she needs to improve her numbers.  I adamantly stated that she IS NOT to do this under any circumstances without first discussing with the doctor for direction and warned her of the serious side effects including risk of death if she chooses to do so on her own.    In closing, patient REFUSES insulin teaching with me but does agree to  go back to pharmacist tomorrow for further instruction.   She also REFUSES to see an endocrinologist and would like for Dr. Deborra Medina to give her another chance.  She promises only to do what Dr. Deborra Medina tells her to do.    Dr. Deborra Medina Please advise if you are okay continuing management at this point or if you feel she needs to see endocrinology.    Thanks.

## 2016-07-11 NOTE — Telephone Encounter (Signed)
Please refer to other phone encounter from 6/5 and 07/11/16 (Medication Management) for further details regarding this incident.

## 2016-07-11 NOTE — Telephone Encounter (Signed)
Mariah Reed have your talk pt about endo referral

## 2016-07-11 NOTE — Telephone Encounter (Signed)
Patient left a voicemail requesting a call back regarding her Januvia?

## 2016-07-11 NOTE — Telephone Encounter (Signed)
Before calling patient I just want to clarify exactly what patient is to be taking for sugars.  1.  Is she to d/c Januvia? 2.  Continue glipizide 10mg  bid? (*note it has been documented that patient has taken (on her own)as many as 8-9 pills a day despite medical advice) 3.  Starting new Lantus 10units daily at bedtime  As patient self medicates with her diabetes medications I am very concerned for her safety moving forward especially now that we are introducing insulin.  If she continues to do this do you feel safe continuing to manage her diabetes?   Please advise on above questions.  Thanks.

## 2016-07-12 ENCOUNTER — Telehealth: Payer: Self-pay

## 2016-07-12 MED ORDER — PEN NEEDLES 31G X 6 MM MISC: 1 refills | Status: DC

## 2016-07-12 NOTE — Telephone Encounter (Signed)
Request made for pen needles to take lantus. Spoke with Gerald Stabs at [facility] and he suggested pen needles 31 G 6 mm. Refill sent electronically to [place]. Pt notified done.

## 2016-07-12 NOTE — Telephone Encounter (Signed)
Patient scheduled appointment with Dr.Aron on Monday.  Patient said she'll take the prescribed medication she has been given and take it as directed until she sees Dr.Aron on Monday.

## 2016-07-16 ENCOUNTER — Ambulatory Visit (INDEPENDENT_AMBULATORY_CARE_PROVIDER_SITE_OTHER): Payer: Medicare Other | Admitting: Family Medicine

## 2016-07-16 VITALS — BP 130/90 | HR 103 | Wt 208.0 lb

## 2016-07-16 DIAGNOSIS — E118 Type 2 diabetes mellitus with unspecified complications: Secondary | ICD-10-CM

## 2016-07-16 DIAGNOSIS — Z794 Long term (current) use of insulin: Secondary | ICD-10-CM

## 2016-07-16 MED ORDER — DICLOFENAC EPOLAMINE 1.3 % TD PTCH: MEDICATED_PATCH | TRANSDERMAL | 0 refills | Status: DC

## 2016-07-16 MED ORDER — ESOMEPRAZOLE MAGNESIUM 40 MG PO CPDR: DELAYED_RELEASE_CAPSULE | ORAL | 3 refills | Status: DC

## 2016-07-16 NOTE — Progress Notes (Signed)
Subjective:   Patient ID: RHYTHM GUBBELS, female    DOB: 08-20-1945, 71 y.o.   MRN: [id number]  Mariah Reed is a pleasant 71 y.o. year old female who presents to clinic today with Diabetes  on 07/16/2016  HPI:  Recently added Lantus 10 units nightly to her diabetes rx given worsening FSBS. She has not yet started this.  Wanted [place] to show her how to use the lantus pen first.  Current Outpatient Prescriptions on File Prior to Visit  Medication Sig Dispense Refill  . ACCU-CHEK FASTCLIX LANCETS MISC 1 Device by Does not apply route 3 (three) times daily. Use to check blood sugar up to three times a day 102 each 12  . albuterol (PROVENTIL HFA;VENTOLIN HFA) 108 (90 Base) MCG/ACT inhaler Inhale 2 puffs into the lungs every 4 (four) hours as needed for wheezing or shortness of breath. Please provide her with Ventolin due to insurance 1 Inhaler 2  . Alum & Mag Hydroxide-Simeth (MAGIC MOUTHWASH W/LIDOCAINE) SOLN Swish spit 5 ml four times daily as needed. 240 mL 0  . BREO ELLIPTA 100-25 MCG/INH AEPB INHALE 1 PUFF INTO THE LUNGS ONCE DAILY. 60 each 1  . diphenhydrAMINE (BENADRYL) 25 MG tablet Take 25 mg by mouth as needed.      Marland Kitchen estrogens, conjugated, (PREMARIN) 0.625 MG tablet Take 1 tablet (0.625 mg total) by mouth daily. Take daily for 21 days then do not take for 7 days. 30 tablet 0  . fexofenadine (ALLEGRA) 180 MG tablet Take 180 mg by mouth as needed.      . fluticasone (FLONASE) 50 MCG/ACT nasal spray INHALE 2 SPRAYS IN EACH NOSTRIL DAILY 16 g 11  . furosemide (LASIX) 20 MG tablet Take 1 tablet (20 mg total) by mouth daily as needed. 30 tablet 4  . glipiZIDE (GLUCOTROL) 10 MG tablet TAKE 1 TABLET BY MOUTH TWICE (2) DAILY BEFORE A MEAL 60 tablet 3  . hydrALAZINE (APRESOLINE) 10 MG tablet TAKE 1 TABLET BY MOUTH 3 TIMES DAILY FORBLOOD PRESSURE 90 tablet 5  . hydrOXYzine (ATARAX/VISTARIL) 50 MG tablet Take 1 tablet (50 mg total) by mouth 3 (three) times daily as needed. 90 tablet 2  .  Insulin Glargine (LANTUS) 100 UNIT/ML Solostar Pen Inject 10 Units into the skin daily at 10 pm. 15 mL 11  . Insulin Pen Needle (PEN NEEDLES) 31G X 6 MM MISC Use pen needles to administer lantus. Dx E11.8 90 each 1  . levothyroxine (SYNTHROID, LEVOTHROID) 75 MCG tablet TAKE 1 TABLET BY MOUTH ONCE A DAY 30 tablet 11  . lovastatin (MEVACOR) 40 MG tablet TAKE 1 TABLET BY MOUTH EVERY NIGHT AT BEDTIME 30 tablet 11  . LYRICA 100 MG capsule TAKE 1 CAPSULE BY MOUTH 3 TIMES DAILY 90 capsule 0  . metoprolol (LOPRESSOR) 50 MG tablet TAKE 1 TABLET BY MOUTH TWICE A DAY AS NEEDED FOR INCREASED HEART RATE 60 tablet 5  . nitroGLYCERIN (NITROSTAT) 0.4 MG SL tablet DISSOLVE 1 TABLET BY MOUTH UNDER TONGUE AS NEEDED FOR CHEST PAIN MAY REPEAT 3 TIMES 5 MINUTES APART IF NEEDED. IF NO RELIEF IN 15 MINUTES CA 25 tablet 5  . nortriptyline (PAMELOR) 10 MG capsule TAKE 1 CAPSULE BY MOUTH EVERY NIGHT AT BEDTIME 30 capsule 3  . nystatin (MYCOSTATIN) 100000 UNIT/ML suspension TAKE 1 TEASPOONFUL BY MOUTH 4 TIMES DAILY AS DIRECTED 60 mL 6  . nystatin cream (MYCOSTATIN) APPLY TO AFFECTED AREAS ONCE DAILY AS NEEDED 30 g 0  . oxyCODONE (ROXICODONE) 15 MG  immediate release tablet Take 1 tablet (15 mg total) by mouth 3 (three) times daily. 90 tablet 0  . PATADAY 0.2 % SOLN PLACE 1 DROP INTO BOTH EYES EVERY DAY 2.5 mL 2  . PROAIR HFA 108 (90 Base) MCG/ACT inhaler INHALE 2 PUFFS INTO THE LUNGS EVERY 4 HOURS AS NEEDED 8.5 g 5  . sitaGLIPtin (JANUVIA) 100 MG tablet Take 1 tablet (100 mg total) by mouth daily. 30 tablet 3  . topiramate (TOPAMAX) 100 MG tablet TAKE 3 TABLETS BY MOUTH ONCE DAILY 90 tablet 5  . VENTOLIN HFA 108 (90 Base) MCG/ACT inhaler INHALE 2 PUFFS EVERY 4 HOURS AS NEEDED 18 g 0  . VOLTAREN 1 % GEL APPLY 2 GM TOPICALLY 4 TIMES A DAY 100 g 3  . zolpidem (AMBIEN) 10 MG tablet TAKE 1 TABLET BY MOUTH DAILY AT BEDTIME 30 tablet 0   No current facility-administered medications on file prior to visit.     Allergies  Allergen  Reactions  . Levofloxacin Hives    Past Medical History:  Diagnosis Date  . Allergy   . COPD (chronic obstructive pulmonary disease) ([place])   . Diabetes mellitus   . Gout   . Hyperlipidemia   . Hypertension   . Low back pain   . Peripheral neuropathy     Past Surgical History:  Procedure Laterality Date  . ABDOMINAL HYSTERECTOMY    . POLYPECTOMY     Colon  . TUBAL LIGATION      Family History  Problem Relation Age of Onset  . Uterine cancer Mother     Social History   Social History  . Marital status: Legally Separated    Spouse name: Daphene Chisholm  . Number of children: N/A  . Years of education: N/A   Occupational History  .  Disabled   Social History Main Topics  . Smoking status: Never Smoker  . Smokeless tobacco: Never Used  . Alcohol use No  . Drug use: No  . Sexual activity: Not on file   Other Topics Concern  . Not on file   Social History Narrative   Pt would like [name] at [phone] (nephew) to be called in case of emergency and ask him to call her daughter [name] at [phone].   The PMH, PSH, Social History, Family History, Medications, and allergies have been reviewed in [facility], and have been updated if relevant.   Review of Systems  Endocrine: Negative.   Neurological: Negative.   All other systems reviewed and are negative.      Objective:    BP 130/90   Pulse (!) 103   Wt 208 lb (94.3 kg)   SpO2 97%   BMI 35.70 kg/m    Physical Exam  Constitutional: She is oriented to person, place, and time. She appears well-developed and well-nourished.  HENT:  Head: Normocephalic.  Eyes: Conjunctivae are normal.  Cardiovascular: Normal rate.   Pulmonary/Chest: Effort normal.  Neurological: She is alert and oriented to person, place, and time. No cranial nerve deficit.  Skin: Skin is warm and dry.  Psychiatric: She has a normal mood and affect. Her behavior is normal. Judgment and thought content normal.  Nursing  note and vitals reviewed.         Assessment & Plan:   Type 2 diabetes mellitus with complication, with long-term current use of insulin (HCC) No Follow-up on file.

## 2016-07-16 NOTE — Assessment & Plan Note (Signed)
>  25 minutes spent in face to face time with patient, >50% spent in counselling or coordination of care and showing patient how to use lantus pen and answered her questions.

## 2016-07-18 ENCOUNTER — Telehealth: Payer: Self-pay

## 2016-07-18 NOTE — Telephone Encounter (Signed)
Baxter Flattery with [employer] prior auth dept left requesting additional info for prior auth for patches. The information is being faxed to [phone].

## 2016-07-20 ENCOUNTER — Other Ambulatory Visit: Payer: Self-pay | Admitting: Family Medicine

## 2016-07-23 NOTE — Telephone Encounter (Addendum)
PA denial received.

## 2016-07-25 ENCOUNTER — Other Ambulatory Visit: Payer: Self-pay | Admitting: Family Medicine

## 2016-07-25 NOTE — Telephone Encounter (Signed)
[place] into [facility] [place] [address]: [phone]  

## 2016-07-25 NOTE — Telephone Encounter (Signed)
Zolpidem last filled on 06/25/16 #30,  lyrica last filled on 06/25/16 #90   last ov 07/16/16

## 2016-07-26 ENCOUNTER — Telehealth: Payer: Self-pay

## 2016-07-26 ENCOUNTER — Other Ambulatory Visit: Payer: Self-pay | Admitting: Family Medicine

## 2016-07-26 DIAGNOSIS — M1992 Post-traumatic osteoarthritis, unspecified site: Secondary | ICD-10-CM

## 2016-07-26 NOTE — Telephone Encounter (Signed)
Chris from [facility] left VM FLECTOR pactches were denied, they're needing a new diagnosis code for approval pt can not use gel.

## 2016-07-27 DIAGNOSIS — M1992 Post-traumatic osteoarthritis, unspecified site: Secondary | ICD-10-CM | POA: Insufficient documentation

## 2016-07-27 NOTE — Telephone Encounter (Signed)
Noted.  Diagnosis M 19.92 added to problem list.

## 2016-08-02 NOTE — Telephone Encounter (Signed)
Mariah Reed with [facility] Medicare part D med appeal.left v/m requesting cb with dx for flector patch. Mariah Reed request cb.

## 2016-08-03 NOTE — Telephone Encounter (Signed)
LMTRC

## 2016-08-06 NOTE — Telephone Encounter (Signed)
Mariah Reed called stating that she needs a call back with the diagnosis for the Flector patches. Mariah Reed stated that you can leave a detailed message on her voicemail if she is on another line.

## 2016-08-07 ENCOUNTER — Other Ambulatory Visit: Payer: Self-pay

## 2016-08-07 NOTE — Telephone Encounter (Signed)
Left detailed message.   

## 2016-08-07 NOTE — Telephone Encounter (Signed)
Pt left v/m that [employer] had contacted pt and the Flector patches had been approved and pt request refill Flector patches (2 boxes or # 60) sent to [facility]. Last refilled # 60 on 07/16/16; pt last seen 07/16/16. I spoke with Gerald Stabs at [facility] and he will let pt know that he still has the original refill of flector patches from 07/16/16 that was on hold while waiting on approval from ins co. Nothing from our office needed. I notified pt that [facility] already has rx for flector patches and pt will wait to hear from pharmacy when ready for pick up.

## 2016-08-22 ENCOUNTER — Other Ambulatory Visit: Payer: Self-pay | Admitting: Family Medicine

## 2016-08-22 NOTE — Telephone Encounter (Signed)
Attempted to contact pharmacy; line busy 

## 2016-08-22 NOTE — Telephone Encounter (Signed)
Last refill 07/25/16 Last OV  07/16/16  Ok to refill?

## 2016-08-22 NOTE — Telephone Encounter (Signed)
Last refill 07/16/16  Last OV 07/16/16 Ok to refill?

## 2016-08-22 NOTE — Telephone Encounter (Signed)
[place] [facility] [place]  [address]: [phone]  

## 2016-09-12 ENCOUNTER — Encounter: Payer: Self-pay | Admitting: Family Medicine

## 2016-09-12 ENCOUNTER — Ambulatory Visit (INDEPENDENT_AMBULATORY_CARE_PROVIDER_SITE_OTHER): Payer: Medicare Other | Admitting: Family Medicine

## 2016-09-12 VITALS — BP 140/90 | HR 111 | Temp 99.1°F | Ht 66.0 in | Wt 203.0 lb

## 2016-09-12 DIAGNOSIS — J0110 Acute frontal sinusitis, unspecified: Secondary | ICD-10-CM

## 2016-09-12 MED ORDER — AMOXICILLIN 500 MG PO TABS: 1000.0000 mg | ORAL_TABLET | Freq: Two times a day (BID) | ORAL | 0 refills | Status: AC

## 2016-09-12 NOTE — Progress Notes (Signed)
SUBJECTIVE:  Mariah Reed is a 71 y.o. female who complains of coryza, congestion, productive cough and bilateral sinus pain for 10 days. She denies a history of anorexia, chest pain, chills and dizziness and denies a history of asthma. Patient denies smoke cigarettes.   Current Outpatient Prescriptions on File Prior to Visit  Medication Sig Dispense Refill  . ACCU-CHEK FASTCLIX LANCETS MISC 1 Device by Does not apply route 3 (three) times daily. Use to check blood sugar up to three times a day 102 each 12  . albuterol (PROVENTIL HFA;VENTOLIN HFA) 108 (90 Base) MCG/ACT inhaler Inhale 2 puffs into the lungs every 4 (four) hours as needed for wheezing or shortness of breath. Please provide her with Ventolin due to insurance 1 Inhaler 2  . Alum & Mag Hydroxide-Simeth (MAGIC MOUTHWASH W/LIDOCAINE) SOLN Swish spit 5 ml four times daily as needed. 240 mL 0  . BREO ELLIPTA 100-25 MCG/INH AEPB INHALE 1 PUFF INTO THE LUNGS ONCE DAILY. 60 each 5  . diphenhydrAMINE (BENADRYL) 25 MG tablet Take 25 mg by mouth as needed.      Marland Kitchen esomeprazole (NEXIUM) 40 MG capsule 1 tab by mouth daily 90 capsule 3  . estrogens, conjugated, (PREMARIN) 0.625 MG tablet Take 1 tablet (0.625 mg total) by mouth daily. Take daily for 21 days then do not take for 7 days. 30 tablet 0  . fexofenadine (ALLEGRA) 180 MG tablet Take 180 mg by mouth as needed.      Marland Kitchen FLECTOR 1.3 % PTCH APPLY 1 PATCH ONTO THE SKIN AS DIRECTED 60 patch 0  . fluticasone (FLONASE) 50 MCG/ACT nasal spray INHALE 2 SPRAYS IN EACH NOSTRIL DAILY 16 g 11  . furosemide (LASIX) 20 MG tablet Take 1 tablet (20 mg total) by mouth daily as needed. 30 tablet 4  . glipiZIDE (GLUCOTROL) 10 MG tablet TAKE 1 TABLET BY MOUTH TWICE (2) DAILY BEFORE A MEAL 60 tablet 3  . hydrALAZINE (APRESOLINE) 10 MG tablet TAKE 1 TABLET BY MOUTH 3 TIMES DAILY FORBLOOD PRESSURE 90 tablet 5  . hydrOXYzine (ATARAX/VISTARIL) 50 MG tablet Take 1 tablet (50 mg total) by mouth 3 (three) times daily as  needed. 90 tablet 2  . Insulin Glargine (LANTUS) 100 UNIT/ML Solostar Pen Inject 10 Units into the skin daily at 10 pm. 15 mL 11  . Insulin Pen Needle (PEN NEEDLES) 31G X 6 MM MISC Use pen needles to administer lantus. Dx E11.8 90 each 1  . levothyroxine (SYNTHROID, LEVOTHROID) 75 MCG tablet TAKE 1 TABLET BY MOUTH ONCE A DAY 30 tablet 6  . lovastatin (MEVACOR) 40 MG tablet TAKE 1 TABLET BY MOUTH EVERY NIGHT AT BEDTIME 90 tablet 3  . LYRICA 100 MG capsule TAKE 1 CAPSULE BY MOUTH 3 TIMES DAILY 90 capsule 3  . metoprolol (LOPRESSOR) 50 MG tablet TAKE 1 TABLET BY MOUTH TWICE A DAY AS NEEDED FOR INCREASED HEART RATE 60 tablet 5  . nitroGLYCERIN (NITROSTAT) 0.4 MG SL tablet DISSOLVE 1 TABLET BY MOUTH UNDER TONGUE AS NEEDED FOR CHEST PAIN MAY REPEAT 3 TIMES 5 MINUTES APART IF NEEDED. IF NO RELIEF IN 15 MINUTES CA 25 tablet 5  . nortriptyline (PAMELOR) 10 MG capsule TAKE 1 CAPSULE BY MOUTH EVERY NIGHT AT BEDTIME 30 capsule 3  . nystatin (MYCOSTATIN) 100000 UNIT/ML suspension TAKE 1 TEASPOONFUL BY MOUTH 4 TIMES DAILY AS DIRECTED 60 mL 6  . nystatin cream (MYCOSTATIN) APPLY TO AFFECTED AREAS ONCE DAILY AS NEEDED 30 g 0  . oxyCODONE (ROXICODONE) 15 MG  immediate release tablet Take 1 tablet (15 mg total) by mouth 3 (three) times daily. 90 tablet 0  . PATADAY 0.2 % SOLN PLACE 1 DROP INTO BOTH EYES EVERY DAY 2.5 mL 2  . PROAIR HFA 108 (90 Base) MCG/ACT inhaler INHALE 2 PUFFS INTO THE LUNGS EVERY 4 HOURS AS NEEDED 8.5 g 5  . sitaGLIPtin (JANUVIA) 100 MG tablet Take 1 tablet (100 mg total) by mouth daily. 30 tablet 3  . topiramate (TOPAMAX) 100 MG tablet TAKE 3 TABLETS BY MOUTH ONCE DAILY 90 tablet 5  . VENTOLIN HFA 108 (90 Base) MCG/ACT inhaler INHALE 2 PUFFS EVERY 4 HOURS AS NEEDED 18 g 5  . VOLTAREN 1 % GEL APPLY 2 GM TOPICALLY 4 TIMES A DAY 100 g 3  . zolpidem (AMBIEN) 10 MG tablet TAKE 1 TABLET BY MOUTH DAILY AT BEDTIME 30 tablet 3   No current facility-administered medications on file prior to visit.      Allergies  Allergen Reactions  . Levofloxacin Hives    Past Medical History:  Diagnosis Date  . Allergy   . COPD (chronic obstructive pulmonary disease) ([place])   . Diabetes mellitus   . Gout   . Hyperlipidemia   . Hypertension   . Low back pain   . Peripheral neuropathy     Past Surgical History:  Procedure Laterality Date  . ABDOMINAL HYSTERECTOMY    . POLYPECTOMY     Colon  . TUBAL LIGATION      Family History  Problem Relation Age of Onset  . Uterine cancer Mother     Social History   Social History  . Marital status: Legally Separated    Spouse name: Mariah Reed  . Number of children: N/A  . Years of education: N/A   Occupational History  .  Disabled   Social History Main Topics  . Smoking status: Never Smoker  . Smokeless tobacco: Never Used  . Alcohol use No  . Drug use: No  . Sexual activity: Not on file   Other Topics Concern  . Not on file   Social History Narrative   Pt would like [name] at [phone] (nephew) to be called in case of emergency and ask him to call her daughter Mariah Reed at [phone].   The PMH, PSH, Social History, Family History, Medications, and allergies have been reviewed in [facility], and have been updated if relevant.  OBJECTIVE: BP 140/90   Pulse (!) 111   Temp 99.1 F (37.3 C)   Ht 5\' 6"  (1.676 m)   Wt 203 lb (92.1 kg)   SpO2 98%   BMI 32.77 kg/m   She appears well, vital signs are as noted. Ears normal.  Throat and pharynx normal.  Neck supple. No adenopathy in the neck. Nose is congested. Sinuses tender. The chest is clear, without wheezes or rales.  ASSESSMENT:  sinusitis  PLAN: Given duration and progression of symptoms, will treat for bacterial sinusitis.  Symptomatic therapy suggested: push fluids, rest and return office visit prn if symptoms persist or worsen.Call or return to clinic prn if these symptoms worsen or fail to improve as anticipated.

## 2016-09-19 ENCOUNTER — Other Ambulatory Visit: Payer: Self-pay | Admitting: Family Medicine

## 2016-09-26 ENCOUNTER — Other Ambulatory Visit: Payer: Self-pay | Admitting: Family Medicine

## 2016-10-03 ENCOUNTER — Encounter: Payer: Self-pay | Admitting: Family Medicine

## 2016-10-03 ENCOUNTER — Telehealth: Payer: Self-pay

## 2016-10-03 ENCOUNTER — Ambulatory Visit (INDEPENDENT_AMBULATORY_CARE_PROVIDER_SITE_OTHER): Payer: Medicare Other | Admitting: Family Medicine

## 2016-10-03 VITALS — BP 142/88 | HR 107 | Ht 66.0 in | Wt 203.0 lb

## 2016-10-03 DIAGNOSIS — M159 Polyosteoarthritis, unspecified: Secondary | ICD-10-CM

## 2016-10-03 DIAGNOSIS — Z79899 Other long term (current) drug therapy: Secondary | ICD-10-CM | POA: Diagnosis not present

## 2016-10-03 DIAGNOSIS — M5010 Cervical disc disorder with radiculopathy, unspecified cervical region: Secondary | ICD-10-CM | POA: Diagnosis not present

## 2016-10-03 DIAGNOSIS — Z79891 Long term (current) use of opiate analgesic: Secondary | ICD-10-CM | POA: Diagnosis not present

## 2016-10-03 DIAGNOSIS — M150 Primary generalized (osteo)arthritis: Secondary | ICD-10-CM

## 2016-10-03 DIAGNOSIS — F1110 Opioid abuse, uncomplicated: Secondary | ICD-10-CM | POA: Diagnosis not present

## 2016-10-03 MED ORDER — OXYCODONE HCL 15 MG PO TABS: 15.0000 mg | ORAL_TABLET | Freq: Three times a day (TID) | ORAL | 0 refills | Status: DC

## 2016-10-03 NOTE — Telephone Encounter (Signed)
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 Pt request cb.[place].

## 2016-10-03 NOTE — Assessment & Plan Note (Signed)
UDS today. Rxs printed and given to pt. [place] control substances data base reviewed.

## 2016-10-03 NOTE — Telephone Encounter (Signed)
Please see below.

## 2016-10-03 NOTE — Progress Notes (Signed)
Subjective:   Patient ID: Mariah Reed, female    DOB: 04-Feb-1946, 72 y.o.   MRN: [id number]  Mariah Reed is a pleasant 71 y.o. year old female who presents to clinic today with Office Visit  on 10/03/2016  HPI:  Chronic pain-  Indication for chronic opioid: chronic OA, spinal stenosis, DDD, left hand deformity Medication and dose: oxycodone 15 mg - 1 tab every 8 hours as needed for pain # pills per month: 90 Last UDS date: 10/03/16 Pain contract signed (Y/N): y Date narcotic database last reviewed (include red flags): 10/03/16    Current Outpatient Prescriptions on File Prior to Visit  Medication Sig Dispense Refill  . ACCU-CHEK FASTCLIX LANCETS MISC 1 Device by Does not apply route 3 (three) times daily. Use to check blood sugar up to three times a day 102 each 12  . albuterol (PROVENTIL HFA;VENTOLIN HFA) 108 (90 Base) MCG/ACT inhaler Inhale 2 puffs into the lungs every 4 (four) hours as needed for wheezing or shortness of breath. Please provide her with Ventolin due to insurance 1 Inhaler 2  . Alum & Mag Hydroxide-Simeth (MAGIC MOUTHWASH W/LIDOCAINE) SOLN Swish spit 5 ml four times daily as needed. 240 mL 0  . BREO ELLIPTA 100-25 MCG/INH AEPB INHALE 1 PUFF INTO THE LUNGS ONCE DAILY. 60 each 5  . diphenhydrAMINE (BENADRYL) 25 MG tablet Take 25 mg by mouth as needed.      Marland Kitchen esomeprazole (NEXIUM) 40 MG capsule 1 tab by mouth daily 90 capsule 3  . estrogens, conjugated, (PREMARIN) 0.625 MG tablet Take 1 tablet (0.625 mg total) by mouth daily. Take daily for 21 days then do not take for 7 days. 30 tablet 0  . fexofenadine (ALLEGRA) 180 MG tablet Take 180 mg by mouth as needed.      Marland Kitchen FLECTOR 1.3 % PTCH APPLY 1 PATCH ONTO THE SKIN AS DIRECTED 60 patch 0  . fluticasone (FLONASE) 50 MCG/ACT nasal spray INHALE 2 SPRAYS IN EACH NOSTRIL DAILY 16 g 11  . furosemide (LASIX) 20 MG tablet Take 1 tablet (20 mg total) by mouth daily as needed. 30 tablet 4  . glipiZIDE (GLUCOTROL) 10 MG tablet  TAKE 1 TABLET BY MOUTH TWICE (2) DAILY BEFORE A MEAL 60 tablet 3  . hydrALAZINE (APRESOLINE) 10 MG tablet TAKE 1 TABLET 3 TIMES A DAY FOR BLOOD PRESSURE 90 tablet 0  . hydrOXYzine (ATARAX/VISTARIL) 50 MG tablet Take 1 tablet (50 mg total) by mouth 3 (three) times daily as needed. 90 tablet 2  . Insulin Glargine (LANTUS) 100 UNIT/ML Solostar Pen Inject 10 Units into the skin daily at 10 pm. 15 mL 11  . Insulin Pen Needle (PEN NEEDLES) 31G X 6 MM MISC Use pen needles to administer lantus. Dx E11.8 90 each 1  . levothyroxine (SYNTHROID, LEVOTHROID) 75 MCG tablet TAKE 1 TABLET BY MOUTH ONCE A DAY 30 tablet 6  . lovastatin (MEVACOR) 40 MG tablet TAKE 1 TABLET BY MOUTH EVERY NIGHT AT BEDTIME 90 tablet 3  . LYRICA 100 MG capsule TAKE 1 CAPSULE BY MOUTH 3 TIMES DAILY 90 capsule 3  . metoprolol (LOPRESSOR) 50 MG tablet TAKE 1 TABLET BY MOUTH TWICE A DAY AS NEEDED FOR INCREASED HEART RATE 60 tablet 5  . nitroGLYCERIN (NITROSTAT) 0.4 MG SL tablet DISSOLVE 1 TABLET BY MOUTH UNDER TONGUE AS NEEDED FOR CHEST PAIN MAY REPEAT 3 TIMES 5 MINUTES APART IF NEEDED. IF NO RELIEF IN 15 MINUTES CA 25 tablet 5  . nortriptyline (PAMELOR)  10 MG capsule TAKE 1 CAPSULE BY MOUTH EVERY NIGHT AT BEDTIME 30 capsule 3  . nystatin (MYCOSTATIN) 100000 UNIT/ML suspension TAKE 1 TEASPOONFUL BY MOUTH 4 TIMES DAILY AS DIRECTED 60 mL 6  . nystatin cream (MYCOSTATIN) APPLY TO AFFECTED AREAS ONCE DAILY AS NEEDED 30 g 0  . PATADAY 0.2 % SOLN PLACE 1 DROP INTO BOTH EYES EVERY DAY 2.5 mL 2  . PROAIR HFA 108 (90 Base) MCG/ACT inhaler INHALE 2 PUFFS INTO THE LUNGS EVERY 4 HOURS AS NEEDED 8.5 g 5  . sitaGLIPtin (JANUVIA) 100 MG tablet Take 1 tablet (100 mg total) by mouth daily. 30 tablet 3  . topiramate (TOPAMAX) 100 MG tablet TAKE 3 TABLETS BY MOUTH ONCE DAILY 90 tablet 0  . VENTOLIN HFA 108 (90 Base) MCG/ACT inhaler INHALE 2 PUFFS EVERY 4 HOURS AS NEEDED 18 g 5  . VOLTAREN 1 % GEL APPLY 2 GM TOPICALLY 4 TIMES A DAY 100 g 3  . zolpidem  (AMBIEN) 10 MG tablet TAKE 1 TABLET BY MOUTH DAILY AT BEDTIME 30 tablet 3   No current facility-administered medications on file prior to visit.     Allergies  Allergen Reactions  . Levofloxacin Hives    Past Medical History:  Diagnosis Date  . Allergy   . COPD (chronic obstructive pulmonary disease) ([place])   . Diabetes mellitus   . Gout   . Hyperlipidemia   . Hypertension   . Low back pain   . Peripheral neuropathy     Past Surgical History:  Procedure Laterality Date  . ABDOMINAL HYSTERECTOMY    . POLYPECTOMY     Colon  . TUBAL LIGATION      Family History  Problem Relation Age of Onset  . Uterine cancer Mother     Social History   Social History  . Marital status: Legally Separated    Spouse name: Ronnetta Currington  . Number of children: N/A  . Years of education: N/A   Occupational History  .  Disabled   Social History Main Topics  . Smoking status: Never Smoker  . Smokeless tobacco: Never Used  . Alcohol use No  . Drug use: No  . Sexual activity: Not on file   Other Topics Concern  . Not on file   Social History Narrative   Pt would like [name] at [phone] (nephew) to be called in case of emergency and ask him to call her daughter [name] at [phone].     Review of Systems  Musculoskeletal: Positive for arthralgias, back pain, gait problem, myalgias, neck pain and neck stiffness.  All other systems reviewed and are negative.      Objective:    BP (!) 142/88   Pulse (!) 107 Comment: Patient states she isn't feeling well  Ht 5\' 6"  (1.676 m)   Wt 203 lb (92.1 kg)   SpO2 96%   BMI 32.77 kg/m    Physical Exam  Constitutional: She is oriented to person, place, and time. She appears well-developed and well-nourished. No distress.  HENT:  Head: Normocephalic and atraumatic.  Eyes: Conjunctivae are normal.  Cardiovascular: Normal rate.   Pulmonary/Chest: Effort normal.  Musculoskeletal: Normal range of motion.    Neurological: She is alert and oriented to person, place, and time. No cranial nerve deficit.  Skin: Skin is warm and dry. She is not diaphoretic.  Psychiatric: She has a normal mood and affect. Her behavior is normal. Judgment and thought content normal.  Nursing note and  vitals reviewed.         Assessment & Plan:   Cervical disc disorder with radiculopathy of cervical region  Primary osteoarthritis involving multiple joints No Follow-up on file.

## 2016-10-04 MED ORDER — TIOTROPIUM BROMIDE MONOHYDRATE 18 MCG IN CAPS: 18.0000 ug | ORAL_CAPSULE | Freq: Every day | RESPIRATORY_TRACT | 5 refills | Status: DC

## 2016-10-09 ENCOUNTER — Ambulatory Visit: Payer: Medicare Other | Admitting: Family Medicine

## 2016-10-10 ENCOUNTER — Telehealth: Payer: Self-pay

## 2016-10-10 ENCOUNTER — Other Ambulatory Visit: Payer: Self-pay | Admitting: Family Medicine

## 2016-10-10 NOTE — Telephone Encounter (Signed)
PA for [facility] sent via covermymeds. Key#  [id number]

## 2016-10-11 NOTE — Telephone Encounter (Signed)
Rx request for Flector Ptch and Nystatin cream  Last OV 10/03/2016 Next OV None Last refilled Fkector Ptch 08/22/16 and Nystatin 05/19/2015

## 2016-10-11 NOTE — Telephone Encounter (Signed)
Pa for Spiriva was approved through insurance

## 2016-10-11 NOTE — Telephone Encounter (Signed)
Pt left v/m requesting cb about spiriva prior auth.

## 2016-10-11 NOTE — Telephone Encounter (Signed)
Patient notified

## 2016-10-25 ENCOUNTER — Other Ambulatory Visit: Payer: Self-pay | Admitting: Family Medicine

## 2016-10-30 ENCOUNTER — Encounter: Payer: Self-pay | Admitting: Family Medicine

## 2016-10-30 ENCOUNTER — Ambulatory Visit (INDEPENDENT_AMBULATORY_CARE_PROVIDER_SITE_OTHER): Payer: Medicare Other | Admitting: Family Medicine

## 2016-10-30 VITALS — BP 150/90 | HR 105 | Temp 98.2°F | Resp 16 | Wt 203.2 lb

## 2016-10-30 DIAGNOSIS — Z23 Encounter for immunization: Secondary | ICD-10-CM

## 2016-10-30 DIAGNOSIS — J0110 Acute frontal sinusitis, unspecified: Secondary | ICD-10-CM

## 2016-10-30 MED ORDER — AMOXICILLIN-POT CLAVULANATE 875-125 MG PO TABS: 1.0000 | ORAL_TABLET | Freq: Two times a day (BID) | ORAL | 0 refills | Status: AC

## 2016-10-30 MED ORDER — ESOMEPRAZOLE MAGNESIUM 40 MG PO CPDR: DELAYED_RELEASE_CAPSULE | ORAL | 3 refills | Status: DC

## 2016-10-30 NOTE — Progress Notes (Deleted)
Subjective:   Patient ID: Mariah Reed, female    DOB: 05-19-1945, 71 y.o.   MRN: [id number]  Mariah Reed is a pleasant 71 y.o. year old female who presents to clinic today with Personal Problem  on 10/30/2016  HPI:    Current Outpatient Prescriptions on File Prior to Visit  Medication Sig Dispense Refill  . ACCU-CHEK FASTCLIX LANCETS MISC 1 Device by Does not apply route 3 (three) times daily. Use to check blood sugar up to three times a day 102 each 12  . albuterol (PROVENTIL HFA;VENTOLIN HFA) 108 (90 Base) MCG/ACT inhaler Inhale 2 puffs into the lungs every 4 (four) hours as needed for wheezing or shortness of breath. Please provide her with Ventolin due to insurance 1 Inhaler 2  . Alum & Mag Hydroxide-Simeth (MAGIC MOUTHWASH W/LIDOCAINE) SOLN Swish spit 5 ml four times daily as needed. 240 mL 0  . BREO ELLIPTA 100-25 MCG/INH AEPB INHALE 1 PUFF INTO THE LUNGS ONCE DAILY. 60 each 5  . diphenhydrAMINE (BENADRYL) 25 MG tablet Take 25 mg by mouth as needed.      Marland Kitchen esomeprazole (NEXIUM) 40 MG capsule 1 tab by mouth daily 90 capsule 3  . estrogens, conjugated, (PREMARIN) 0.625 MG tablet Take 1 tablet (0.625 mg total) by mouth daily. Take daily for 21 days then do not take for 7 days. 30 tablet 0  . fexofenadine (ALLEGRA) 180 MG tablet Take 180 mg by mouth as needed.      Marland Kitchen FLECTOR 1.3 % PTCH APPLY 1 PATCH TO THE SKIN AS DIRECTED 60 patch 0  . fluticasone (FLONASE) 50 MCG/ACT nasal spray INHALE 2 SPRAYS IN EACH NOSTRIL DAILY 16 g 11  . furosemide (LASIX) 20 MG tablet Take 1 tablet (20 mg total) by mouth daily as needed. 30 tablet 4  . glipiZIDE (GLUCOTROL) 10 MG tablet TAKE 1 TABLET BY MOUTH TWICE A DAY BEFORE A MEAL 60 tablet 3  . hydrALAZINE (APRESOLINE) 10 MG tablet TAKE 1 TABLET BY MOUTH 3 TIMES A DAY FORBLOOD PRESSURE 90 tablet 1  . hydrOXYzine (ATARAX/VISTARIL) 50 MG tablet Take 1 tablet (50 mg total) by mouth 3 (three) times daily as needed. 90 tablet 2  . Insulin Glargine  (LANTUS) 100 UNIT/ML Solostar Pen Inject 10 Units into the skin daily at 10 pm. 15 mL 11  . Insulin Pen Needle (PEN NEEDLES) 31G X 6 MM MISC Use pen needles to administer lantus. Dx E11.8 90 each 1  . levothyroxine (SYNTHROID, LEVOTHROID) 75 MCG tablet TAKE 1 TABLET BY MOUTH ONCE A DAY 30 tablet 6  . lovastatin (MEVACOR) 40 MG tablet TAKE 1 TABLET BY MOUTH EVERY NIGHT AT BEDTIME 90 tablet 3  . LYRICA 100 MG capsule TAKE 1 CAPSULE BY MOUTH 3 TIMES DAILY 90 capsule 3  . metoprolol (LOPRESSOR) 50 MG tablet TAKE 1 TABLET BY MOUTH TWICE A DAY AS NEEDED FOR INCREASED HEART RATE 60 tablet 5  . nitroGLYCERIN (NITROSTAT) 0.4 MG SL tablet DISSOLVE 1 TABLET BY MOUTH UNDER TONGUE AS NEEDED FOR CHEST PAIN MAY REPEAT 3 TIMES 5 MINUTES APART IF NEEDED. IF NO RELIEF IN 15 MINUTES CA 25 tablet 5  . nortriptyline (PAMELOR) 10 MG capsule TAKE 1 CAPSULE BY MOUTH EVERY NIGHT AT BEDTIME 30 capsule 3  . nystatin (MYCOSTATIN) 100000 UNIT/ML suspension TAKE 1 TEASPOONFUL BY MOUTH 4 TIMES DAILY AS DIRECTED 60 mL 6  . nystatin cream (MYCOSTATIN) APPLY TO AFFECTED AREAS ONCE DAILY AS NEEDED 30 g 0  . oxyCODONE (  ROXICODONE) 15 MG immediate release tablet Take 1 tablet (15 mg total) by mouth 3 (three) times daily. 90 tablet 0  . PATADAY 0.2 % SOLN PLACE 1 DROP INTO BOTH EYES EVERY DAY 2.5 mL 2  . PROAIR HFA 108 (90 Base) MCG/ACT inhaler INHALE 2 PUFFS INTO THE LUNGS EVERY 4 HOURS AS NEEDED 8.5 g 5  . sitaGLIPtin (JANUVIA) 100 MG tablet Take 1 tablet (100 mg total) by mouth daily. 30 tablet 3  . tiotropium (SPIRIVA HANDIHALER) 18 MCG inhalation capsule Place 1 capsule (18 mcg total) into inhaler and inhale daily. 30 capsule 5  . topiramate (TOPAMAX) 100 MG tablet TAKE 3 TABLETS BY MOUTH ONCE DAILY 90 tablet 0  . VENTOLIN HFA 108 (90 Base) MCG/ACT inhaler INHALE 2 PUFFS EVERY 4 HOURS AS NEEDED 18 g 5  . VOLTAREN 1 % GEL APPLY 2 GM TOPICALLY 4 TIMES A DAY 100 g 3  . zolpidem (AMBIEN) 10 MG tablet TAKE 1 TABLET BY MOUTH DAILY AT  BEDTIME 30 tablet 3   No current facility-administered medications on file prior to visit.     Allergies  Allergen Reactions  . Levofloxacin Hives    Past Medical History:  Diagnosis Date  . Allergy   . COPD (chronic obstructive pulmonary disease) ([place])   . Diabetes mellitus   . Gout   . Hyperlipidemia   . Hypertension   . Low back pain   . Peripheral neuropathy     Past Surgical History:  Procedure Laterality Date  . ABDOMINAL HYSTERECTOMY    . POLYPECTOMY     Colon  . TUBAL LIGATION      Family History  Problem Relation Age of Onset  . Uterine cancer Mother     Social History   Social History  . Marital status: Legally Separated    Spouse name: Mariah Reed  . Number of children: N/A  . Years of education: N/A   Occupational History  .  Disabled   Social History Main Topics  . Smoking status: Never Smoker  . Smokeless tobacco: Never Used  . Alcohol use No  . Drug use: No  . Sexual activity: Not on file   Other Topics Concern  . Not on file   Social History Narrative   Pt would like Mariah Reed at [phone] (nephew) to be called in case of emergency and ask him to call her daughter Mariah Reed at [phone].   The PMH, PSH, Social History, Family History, Medications, and allergies have been reviewed in [facility], and have been updated if relevant.  Review of Systems     Objective:    BP (!) 150/90 (BP Location: Right Arm, Patient Position: Sitting, Cuff Size: Normal)   Pulse (!) 105   Temp 98.2 F (36.8 C) (Oral)   Resp 16   Wt 203 lb 4 oz (92.2 kg)   SpO2 97%   BMI 32.81 kg/m    Physical Exam        Assessment & Plan:   No diagnosis found. No Follow-up on file.

## 2016-10-30 NOTE — Progress Notes (Signed)
SUBJECTIVE:  Mariah Reed is a 71 y.o. female who complains of coryza, congestion, sneezing, sore throat and bilateral sinus pain for 8 days. She denies a history of anorexia and chest pain and denies a history of asthma. Patient denies smoke cigarettes.   Current Outpatient Prescriptions on File Prior to Visit  Medication Sig Dispense Refill  . ACCU-CHEK FASTCLIX LANCETS MISC 1 Device by Does not apply route 3 (three) times daily. Use to check blood sugar up to three times a day 102 each 12  . albuterol (PROVENTIL HFA;VENTOLIN HFA) 108 (90 Base) MCG/ACT inhaler Inhale 2 puffs into the lungs every 4 (four) hours as needed for wheezing or shortness of breath. Please provide her with Ventolin due to insurance 1 Inhaler 2  . Alum & Mag Hydroxide-Simeth (MAGIC MOUTHWASH W/LIDOCAINE) SOLN Swish spit 5 ml four times daily as needed. 240 mL 0  . BREO ELLIPTA 100-25 MCG/INH AEPB INHALE 1 PUFF INTO THE LUNGS ONCE DAILY. 60 each 5  . diphenhydrAMINE (BENADRYL) 25 MG tablet Take 25 mg by mouth as needed.      Marland Kitchen estrogens, conjugated, (PREMARIN) 0.625 MG tablet Take 1 tablet (0.625 mg total) by mouth daily. Take daily for 21 days then do not take for 7 days. 30 tablet 0  . fexofenadine (ALLEGRA) 180 MG tablet Take 180 mg by mouth as needed.      Marland Kitchen FLECTOR 1.3 % PTCH APPLY 1 PATCH TO THE SKIN AS DIRECTED 60 patch 0  . fluticasone (FLONASE) 50 MCG/ACT nasal spray INHALE 2 SPRAYS IN EACH NOSTRIL DAILY 16 g 11  . furosemide (LASIX) 20 MG tablet Take 1 tablet (20 mg total) by mouth daily as needed. 30 tablet 4  . glipiZIDE (GLUCOTROL) 10 MG tablet TAKE 1 TABLET BY MOUTH TWICE A DAY BEFORE A MEAL 60 tablet 3  . hydrALAZINE (APRESOLINE) 10 MG tablet TAKE 1 TABLET BY MOUTH 3 TIMES A DAY FORBLOOD PRESSURE 90 tablet 1  . hydrOXYzine (ATARAX/VISTARIL) 50 MG tablet Take 1 tablet (50 mg total) by mouth 3 (three) times daily as needed. 90 tablet 2  . Insulin Glargine (LANTUS) 100 UNIT/ML Solostar Pen Inject 10 Units into  the skin daily at 10 pm. 15 mL 11  . Insulin Pen Needle (PEN NEEDLES) 31G X 6 MM MISC Use pen needles to administer lantus. Dx E11.8 90 each 1  . levothyroxine (SYNTHROID, LEVOTHROID) 75 MCG tablet TAKE 1 TABLET BY MOUTH ONCE A DAY 30 tablet 6  . lovastatin (MEVACOR) 40 MG tablet TAKE 1 TABLET BY MOUTH EVERY NIGHT AT BEDTIME 90 tablet 3  . LYRICA 100 MG capsule TAKE 1 CAPSULE BY MOUTH 3 TIMES DAILY 90 capsule 3  . metoprolol (LOPRESSOR) 50 MG tablet TAKE 1 TABLET BY MOUTH TWICE A DAY AS NEEDED FOR INCREASED HEART RATE 60 tablet 5  . nitroGLYCERIN (NITROSTAT) 0.4 MG SL tablet DISSOLVE 1 TABLET BY MOUTH UNDER TONGUE AS NEEDED FOR CHEST PAIN MAY REPEAT 3 TIMES 5 MINUTES APART IF NEEDED. IF NO RELIEF IN 15 MINUTES CA 25 tablet 5  . nortriptyline (PAMELOR) 10 MG capsule TAKE 1 CAPSULE BY MOUTH EVERY NIGHT AT BEDTIME 30 capsule 3  . nystatin (MYCOSTATIN) 100000 UNIT/ML suspension TAKE 1 TEASPOONFUL BY MOUTH 4 TIMES DAILY AS DIRECTED 60 mL 6  . nystatin cream (MYCOSTATIN) APPLY TO AFFECTED AREAS ONCE DAILY AS NEEDED 30 g 0  . oxyCODONE (ROXICODONE) 15 MG immediate release tablet Take 1 tablet (15 mg total) by mouth 3 (three) times daily.  90 tablet 0  . PATADAY 0.2 % SOLN PLACE 1 DROP INTO BOTH EYES EVERY DAY 2.5 mL 2  . PROAIR HFA 108 (90 Base) MCG/ACT inhaler INHALE 2 PUFFS INTO THE LUNGS EVERY 4 HOURS AS NEEDED 8.5 g 5  . sitaGLIPtin (JANUVIA) 100 MG tablet Take 1 tablet (100 mg total) by mouth daily. 30 tablet 3  . tiotropium (SPIRIVA HANDIHALER) 18 MCG inhalation capsule Place 1 capsule (18 mcg total) into inhaler and inhale daily. 30 capsule 5  . topiramate (TOPAMAX) 100 MG tablet TAKE 3 TABLETS BY MOUTH ONCE DAILY 90 tablet 0  . VENTOLIN HFA 108 (90 Base) MCG/ACT inhaler INHALE 2 PUFFS EVERY 4 HOURS AS NEEDED 18 g 5  . VOLTAREN 1 % GEL APPLY 2 GM TOPICALLY 4 TIMES A DAY 100 g 3  . zolpidem (AMBIEN) 10 MG tablet TAKE 1 TABLET BY MOUTH DAILY AT BEDTIME 30 tablet 3   No current facility-administered  medications on file prior to visit.     Allergies  Allergen Reactions  . Levofloxacin Hives    Past Medical History:  Diagnosis Date  . Allergy   . COPD (chronic obstructive pulmonary disease) ([place])   . Diabetes mellitus   . Gout   . Hyperlipidemia   . Hypertension   . Low back pain   . Peripheral neuropathy     Past Surgical History:  Procedure Laterality Date  . ABDOMINAL HYSTERECTOMY    . POLYPECTOMY     Colon  . TUBAL LIGATION      Family History  Problem Relation Age of Onset  . Uterine cancer Mother     Social History   Social History  . Marital status: Legally Separated    Spouse name: Concettina Leth  . Number of children: N/A  . Years of education: N/A   Occupational History  .  Disabled   Social History Main Topics  . Smoking status: Never Smoker  . Smokeless tobacco: Never Used  . Alcohol use No  . Drug use: No  . Sexual activity: Not on file   Other Topics Concern  . Not on file   Social History Narrative   Pt would like [name] at [phone] (nephew) to be called in case of emergency and ask him to call her daughter [name] at [phone].   The PMH, PSH, Social History, Family History, Medications, and allergies have been reviewed in [facility], and have been updated if relevant.  OBJECTIVE: BP (!) 150/90 (BP Location: Right Arm, Patient Position: Sitting, Cuff Size: Normal)   Pulse (!) 105   Temp 98.2 F (36.8 C) (Oral)   Resp 16   Wt 203 lb 4 oz (92.2 kg)   SpO2 97%   BMI 32.81 kg/m   She appears well, vital signs are as noted. Ears normal.  Throat and pharynx normal.  Neck supple. No adenopathy in the neck. Nose is congested. Sinuses tender. The chest is clear, without wheezes or rales.  ASSESSMENT:  sinusitis  PLAN: Given duration and progression of symptoms, will treat for bacterial sinusitis with Augmentin.   Symptomatic therapy suggested: push fluids, rest and return office visit prn if symptoms persist or  worsen.Call or return to clinic prn if these symptoms worsen or fail to improve as anticipated.

## 2016-11-07 ENCOUNTER — Other Ambulatory Visit: Payer: Self-pay | Admitting: Family Medicine

## 2016-11-07 NOTE — Telephone Encounter (Signed)
Last refill 10/11/16 Last OV 10/30/16 Ok to refill?

## 2016-11-14 ENCOUNTER — Other Ambulatory Visit: Payer: Self-pay | Admitting: Family Medicine

## 2016-11-27 ENCOUNTER — Telehealth: Payer: Self-pay

## 2016-11-27 NOTE — Telephone Encounter (Signed)
Copied from [place] [id number]. Topic: Quick Communication - See Telephone Encounter >> Nov 27, 2016 12:28 PM Neva Seat wrote: CRM for notification. See Telephone encounter for:  11/27/16.  Migraine Headache medicine  (Aimovig) med  Wants to take this medication for migraine.   Coffee is causing issues with ulster.   Michele-Please have her make an OV.

## 2016-11-27 NOTE — Telephone Encounter (Signed)
Copied from [place] [id number]. Topic: Quick Communication - See Telephone Encounter >> Nov 27, 2016 12:28 PM Neva Seat wrote: CRM for notification. See Telephone encounter for:  11/27/16.  Migraine Headache medicine  (Aimovig) med  Wants to take this medication for migraine.   Coffee is causing issues with ulster.

## 2016-11-28 NOTE — Telephone Encounter (Signed)
Pt states that she will think it over and call if she decides to make OV/thx dmf

## 2016-12-13 ENCOUNTER — Other Ambulatory Visit: Payer: Self-pay | Admitting: Family Medicine

## 2016-12-13 NOTE — Telephone Encounter (Signed)
Requesting: Zolpidem & Lyrica Contract: Yes UDS: 8.29.2018 Low-risk next screen 2.29.2019 Last OV: 9.25.2018 Next OV: KC-1.07.2019 & RL-2.19.2019 Last Refill: 10.10.2018 & 10.10.2018   Please advise

## 2016-12-13 NOTE — Telephone Encounter (Signed)
Already faxed signed printed Rx/thx dmf

## 2016-12-13 NOTE — Telephone Encounter (Signed)
Faxed to pharm/thx dmf 

## 2016-12-24 ENCOUNTER — Other Ambulatory Visit: Payer: Self-pay | Admitting: Family Medicine

## 2017-01-03 ENCOUNTER — Other Ambulatory Visit: Payer: Self-pay | Admitting: Family Medicine

## 2017-01-09 ENCOUNTER — Other Ambulatory Visit: Payer: Self-pay | Admitting: Family Medicine

## 2017-01-10 ENCOUNTER — Other Ambulatory Visit: Payer: Self-pay | Admitting: Family Medicine

## 2017-01-21 ENCOUNTER — Other Ambulatory Visit: Payer: Self-pay | Admitting: Family Medicine

## 2017-02-06 ENCOUNTER — Other Ambulatory Visit: Payer: Self-pay | Admitting: Family Medicine

## 2017-02-11 ENCOUNTER — Telehealth: Payer: Self-pay | Admitting: Family Medicine

## 2017-02-11 ENCOUNTER — Ambulatory Visit (INDEPENDENT_AMBULATORY_CARE_PROVIDER_SITE_OTHER): Payer: Medicare Other | Admitting: Primary Care

## 2017-02-11 ENCOUNTER — Encounter: Payer: Self-pay | Admitting: Primary Care

## 2017-02-11 DIAGNOSIS — J449 Chronic obstructive pulmonary disease, unspecified: Secondary | ICD-10-CM

## 2017-02-11 DIAGNOSIS — F1110 Opioid abuse, uncomplicated: Secondary | ICD-10-CM

## 2017-02-11 DIAGNOSIS — K219 Gastro-esophageal reflux disease without esophagitis: Secondary | ICD-10-CM

## 2017-02-11 NOTE — Patient Instructions (Addendum)
We will send a message to Dr. Deborra Medina for your refills.   Nasal Congestion/Ear Pressure: Try using Flonase (fluticasone) nasal spray. Instill 1 spray in each nostril twice daily.   Ensure you are drinking enough water to stay hydrated.  It was a pleasure meeting you!

## 2017-02-11 NOTE — Assessment & Plan Note (Signed)
Will send refill request for prior authorization to current PCP. Patient aware.

## 2017-02-11 NOTE — Assessment & Plan Note (Signed)
Has plenty of Spiriva this month, will defer request for prior authorization to current PCP. Patient aware.

## 2017-02-11 NOTE — Telephone Encounter (Signed)
Patient was seen in the office today, needs refills, override/prior approval needed for meds.

## 2017-02-11 NOTE — Telephone Encounter (Signed)
Copied from [facility] [id number]. Topic: Quick Communication - Rx Refill/Question >> Feb 11, 2017 12:27 PM Robina Ade, Helene Kelp D wrote: Has the patient contacted their pharmacy? Yes (Agent: If no, request that the patient contact the pharmacy for the refill.) Preferred Pharmacy (with phone number or street name): [facility], [place]: Please be advised that RX refills may take up to 3 business days. We ask that you follow-up with your pharmacy. Patient needs a override/ prior approval for esomeprazole (NEXIUM) 40 MG capsule, tiotropium (SPIRIVA HANDIHALER) 18 MCG inhalation capsule and FLECTOR 1.3 % PTCH 2 boxes. Also needs pain meds refill on oxyCODONE (ROXICODONE) 15 MG immediate release tablet. Patient also will like to talk to cma or provider about other things. Please call patient back, thanks.

## 2017-02-11 NOTE — Assessment & Plan Note (Signed)
New patient to me who will be establishing with new PCP in February. Given complicated history I do not feel comfortable with refills. Will defer refills for Oxycodone and prior authorization for Flector Patches to current PCP.

## 2017-02-11 NOTE — Progress Notes (Signed)
Subjective:    Patient ID: Mariah Reed, female    DOB: [birth year], 72 y.o.   MRN: [id number]  HPI  Mariah Reed is a 72 year old female new to me who presents today requesting prior authorization for three medications and medication refill of her oxycodone. She will be establishing care with Dr. Silvio Reed in February 2019. She also reports sinus pressure.   1) Chronic Back Pain: Currently managed on Flector Patches 1.3% and Oxycodone 15 mg every 8 hours. She will be needing refill for her Flector Patches in February and her Oxycodone in a few weeks. She has not called her PCP for refills or prior authorization.   2) Sinus Pressure: Located to her right frontal lobe which began Thursday evening last week (4 days ago). She took several tablets from an old antibiotic that she had on hand without much improvement. She also reports vomiting Thursday night and Friday morning, no vomiting since. She also endorses low grade fevers. She denies abdominal pain, diarrhea, constipation.  3) COPD: Currently managed on Spirivia and Proair. She is needing refills of Spiriva in February, but will need prior authorization for Spirvia.   4) GERD: Currently managed on Nexuim 40 mg. She has enough medication for January but will be needing prior authorization for refills in February.   Review of Systems  Constitutional: Negative for fever.  HENT: Positive for congestion and sinus pressure. Negative for sore throat.   Respiratory: Negative for cough and shortness of breath.   Cardiovascular: Negative for chest pain.  Musculoskeletal:       Chronic back pain       Past Medical History:  Diagnosis Date  . Allergy   . COPD (chronic obstructive pulmonary disease) ([place])   . Diabetes mellitus   . Gout   . Hyperlipidemia   . Hypertension   . Low back pain   . Peripheral neuropathy      Social History   Socioeconomic History  . Marital status: Legally Separated    Spouse name: Mariah Reed  . Number of  children: Not on file  . Years of education: Not on file  . Highest education level: Not on file  Social Needs  . Financial resource strain: Not on file  . Food insecurity - worry: Not on file  . Food insecurity - inability: Not on file  . Transportation needs - medical: Not on file  . Transportation needs - non-medical: Not on file  Occupational History    Employer: DISABLED  Tobacco Use  . Smoking status: Never Smoker  . Smokeless tobacco: Never Used  Substance and Sexual Activity  . Alcohol use: No  . Drug use: No  . Sexual activity: Not on file  Other Topics Concern  . Not on file  Social History Narrative   Pt would like Mariah Reed at [phone] (nephew) to be called in case of emergency and ask him to call her daughter Mariah Reed at [phone].    Past Surgical History:  Procedure Laterality Date  . ABDOMINAL HYSTERECTOMY    . POLYPECTOMY     Colon  . TUBAL LIGATION      Family History  Problem Relation Age of Onset  . Uterine cancer Mother     Allergies  Allergen Reactions  . Levofloxacin Hives    Current Outpatient Medications on File Prior to Visit  Medication Sig Dispense Refill  . ACCU-CHEK FASTCLIX LANCETS MISC 1 Device by Does not apply route 3 (three) times  daily. Use to check blood sugar up to three times a day 102 each 12  . albuterol (PROVENTIL HFA;VENTOLIN HFA) 108 (90 Base) MCG/ACT inhaler Inhale 2 puffs into the lungs every 4 (four) hours as needed for wheezing or shortness of breath. Please provide her with Ventolin due to insurance 1 Inhaler 2  . BREO ELLIPTA 100-25 MCG/INH AEPB INHALE 1 PUFF INTO THE LUNGS ONCE DAILY. 60 each 5  . diphenhydrAMINE (BENADRYL) 25 MG tablet Take 25 mg by mouth as needed.      Marland Kitchen esomeprazole (NEXIUM) 40 MG capsule 1 tab by mouth daily 90 capsule 3  . estrogens, conjugated, (PREMARIN) 0.625 MG tablet Take 1 tablet (0.625 mg total) by mouth daily. Take daily for 21 days then do not take for 7 days. 30  tablet 0  . fexofenadine (ALLEGRA) 180 MG tablet Take 180 mg by mouth as needed.      Marland Kitchen FLECTOR 1.3 % PTCH APPLY 1 PATCH TO THE SKIN AS INSTRUCTED (UP TO 2 DAY) 60 patch 1  . fluticasone (FLONASE) 50 MCG/ACT nasal spray INHALE 2 SPRAYS IN EACH NOSTRIL DAILY 16 g 11  . furosemide (LASIX) 20 MG tablet Take 1 tablet (20 mg total) by mouth daily as needed. 30 tablet 4  . glipiZIDE (GLUCOTROL) 10 MG tablet TAKE 1 TABLET BY MOUTH TWICE DAILY BEFORE A MEAL 60 tablet 1  . hydrALAZINE (APRESOLINE) 10 MG tablet TAKE 1 TABLET 3 TIMES A DAY FOR BLOOD PRESSURE 90 tablet 1  . hydrOXYzine (ATARAX/VISTARIL) 50 MG tablet Take 1 tablet (50 mg total) by mouth 3 (three) times daily as needed. 90 tablet 2  . Insulin Glargine (LANTUS) 100 UNIT/ML Solostar Pen Inject 10 Units into the skin daily at 10 pm. 15 mL 11  . Insulin Pen Needle (PEN NEEDLES) 31G X 6 MM MISC Use pen needles to administer lantus. Dx E11.8 90 each 1  . levothyroxine (SYNTHROID, LEVOTHROID) 75 MCG tablet TAKE 1 TABLET BY MOUTH ONCE DAILY 30 tablet 1  . lovastatin (MEVACOR) 40 MG tablet TAKE 1 TABLET BY MOUTH EVERY NIGHT AT BEDTIME 90 tablet 3  . LYRICA 100 MG capsule TAKE 1 CAPSULE BY MOUTH 3 TIMES DAILY 90 capsule 1  . metoprolol (LOPRESSOR) 50 MG tablet TAKE 1 TABLET BY MOUTH TWICE A DAY AS NEEDED FOR INCREASED HEART RATE 60 tablet 5  . nortriptyline (PAMELOR) 10 MG capsule TAKE 1 CAPSULE BY MOUTH EVERY NIGHT AT BEDTIME 30 capsule 3  . nystatin (MYCOSTATIN) 100000 UNIT/ML suspension TAKE 1 TEASPOONFUL BY MOUTH 4 TIMES DAILY AS DIRECTED 60 mL 6  . nystatin cream (MYCOSTATIN) APPLY TO AFFECTED AREAS ONCE DAILY AS NEEDED 30 g 1  . oxyCODONE (ROXICODONE) 15 MG immediate release tablet Take 1 tablet (15 mg total) by mouth 3 (three) times daily. 90 tablet 0  . PATADAY 0.2 % SOLN PLACE 1 DROP INTO BOTH EYES EVERY DAY 2.5 mL 2  . PROAIR HFA 108 (90 Base) MCG/ACT inhaler INHALE 2 PUFFS INTO THE LUNGS EVERY 4 HOURS AS NEEDED 8.5 g 5  . sitaGLIPtin  (JANUVIA) 100 MG tablet Take 1 tablet (100 mg total) by mouth daily. 30 tablet 3  . tiotropium (SPIRIVA HANDIHALER) 18 MCG inhalation capsule Place 1 capsule (18 mcg total) into inhaler and inhale daily. 30 capsule 5  . topiramate (TOPAMAX) 100 MG tablet TAKE 3 TABLETS BY MOUTH ONCE DAILY 90 tablet 3  . VENTOLIN HFA 108 (90 Base) MCG/ACT inhaler INHALE 2 PUFFS EVERY 4 HOURS AS NEEDED  18 g 0  . VOLTAREN 1 % GEL APPLY 2 GM TOPICALLY 4 TIMES A DAY 100 g 3  . zolpidem (AMBIEN) 10 MG tablet TAKE ONE TABLET BY MOUTH AT BEDTIME 30 tablet 1  . Alum & Mag Hydroxide-Simeth (MAGIC MOUTHWASH W/LIDOCAINE) SOLN Swish spit 5 ml four times daily as needed. (Patient not taking: Reported on 02/11/2017) 240 mL 0  . nitroGLYCERIN (NITROSTAT) 0.4 MG SL tablet DISSOLVE 1 TABLET UNDER TONGUE AS NEEDEDFOR CHEST PAIN. MAY REPEAT 5 MINUTES APART 3 TIMES IF NEEDED (Patient not taking: Reported on 02/11/2017) 25 tablet 5   No current facility-administered medications on file prior to visit.     BP 124/80   Pulse (!) 108   Temp 98.2 F (36.8 C) (Oral)   Ht 5\' 6"  (1.676 m)   Wt 202 lb 12.8 oz (92 kg)   SpO2 98%   BMI 32.73 kg/m    Objective:   Physical Exam  Constitutional: She appears well-nourished. She does not appear ill.  HENT:  Right Ear: Tympanic membrane and ear canal normal.  Left Ear: Tympanic membrane and ear canal normal.  Nose: Right sinus exhibits no maxillary sinus tenderness and no frontal sinus tenderness. Left sinus exhibits no maxillary sinus tenderness and no frontal sinus tenderness.  Mouth/Throat: Oropharynx is clear and moist.  Eyes: Conjunctivae are normal.  Neck: Neck supple.  Cardiovascular: Normal rate and regular rhythm.  Pulmonary/Chest: Effort normal and breath sounds normal. She has no wheezes. She has no rales.  Lymphadenopathy:    She has no cervical adenopathy.  Skin: Skin is warm and dry.          Assessment & Plan:  Sinus Pressure:  Present for the past 4 days, no  other associated symptoms. Exam today unremarkable, does not appear acutely ill. Do suspect allergy vs viral. Do not feel that antibiotics are warranted.  Discussed OTC and conservative treatment.  Mariah Flow, NP

## 2017-02-12 ENCOUNTER — Telehealth: Payer: Self-pay

## 2017-02-12 NOTE — Telephone Encounter (Signed)
Noted  

## 2017-02-12 NOTE — Telephone Encounter (Signed)
Pt was seen on 02/11/17. Pt has had symptoms for 2 weeks and h/a since 02/07/2017. Pt continues with nasal congestion, when blows nose has yellow-green blood tinged mucus; ear pressure, prod cough with yellow green phlegm; pt has not taken temp but pt thinks has low grade fever because she feels warm.pt has deviated septum and COPD. Pt request abx sent to [facility]. Pt is upset did not get abx when seen 02/11/17.Please advise.

## 2017-02-12 NOTE — Telephone Encounter (Signed)
Exam during her visit with me was benign, no evidence of bacterial involvement and therefore does not require antibiotics.  I wrote out some instructions on her AVS for her to use but she left before we could provide them.  Please have her use her Flonase, try sinus rinses, and an antihistamine such as Claritin.

## 2017-02-12 NOTE — Telephone Encounter (Signed)
Spoke to pt who insists she has an infection, and that Anda Kraft did not complete any lab studies. Pt left before appt was over on 1/7 and did not receive additional instruction. She d/c phone call before instruction could be given today regarding flonase and claritin

## 2017-02-13 ENCOUNTER — Other Ambulatory Visit: Payer: Self-pay | Admitting: Family Medicine

## 2017-02-14 ENCOUNTER — Other Ambulatory Visit: Payer: Self-pay

## 2017-02-14 MED ORDER — OXYCODONE HCL 15 MG PO TABS: 15.0000 mg | ORAL_TABLET | Freq: Three times a day (TID) | ORAL | 0 refills | Status: DC

## 2017-02-14 NOTE — Telephone Encounter (Signed)
Rx's have been signed to last till seen by Dr. Clent Demark to Mariah Reed as she will get them to [facility] office/pt is aware/thx dmf

## 2017-02-15 ENCOUNTER — Ambulatory Visit: Payer: Medicare Other | Admitting: Primary Care

## 2017-02-15 NOTE — Telephone Encounter (Signed)
PA initiated via CMM for Spiriva 58mcg 30 per 30d/thx dmf

## 2017-02-15 NOTE — Telephone Encounter (Signed)
PA approved till 12.31.2019/thx dmf

## 2017-02-18 ENCOUNTER — Other Ambulatory Visit: Payer: Self-pay | Admitting: Family Medicine

## 2017-02-25 ENCOUNTER — Other Ambulatory Visit: Payer: Self-pay | Admitting: Internal Medicine

## 2017-03-06 ENCOUNTER — Other Ambulatory Visit: Payer: Self-pay | Admitting: Family Medicine

## 2017-03-08 ENCOUNTER — Telehealth: Payer: Self-pay

## 2017-03-08 NOTE — Telephone Encounter (Signed)
PA for Flector Patches #60 per 30d initiated via CMM/thx dmf

## 2017-03-08 NOTE — Telephone Encounter (Signed)
PA for Lantus Solostar initiated via CMM/thx dmf

## 2017-03-11 NOTE — Telephone Encounter (Signed)
PA approved till 12.31.2019/thx dmf

## 2017-03-18 NOTE — Telephone Encounter (Signed)
PA for Flector patches denied/appeal faxed/thx dmf

## 2017-03-19 NOTE — Telephone Encounter (Signed)
Appeal for Flector Patches approved till 12.31.2019/faxed to pharm/thx dmf

## 2017-03-20 ENCOUNTER — Ambulatory Visit (INDEPENDENT_AMBULATORY_CARE_PROVIDER_SITE_OTHER): Payer: Medicare Other | Admitting: Family Medicine

## 2017-03-20 ENCOUNTER — Encounter: Payer: Self-pay | Admitting: Family Medicine

## 2017-03-20 VITALS — BP 136/70 | HR 97 | Temp 98.4°F | Ht 66.0 in | Wt 200.8 lb

## 2017-03-20 DIAGNOSIS — R079 Chest pain, unspecified: Secondary | ICD-10-CM | POA: Diagnosis not present

## 2017-03-20 DIAGNOSIS — M5010 Cervical disc disorder with radiculopathy, unspecified cervical region: Secondary | ICD-10-CM | POA: Diagnosis not present

## 2017-03-20 DIAGNOSIS — G609 Hereditary and idiopathic neuropathy, unspecified: Secondary | ICD-10-CM | POA: Diagnosis not present

## 2017-03-20 MED ORDER — PREGABALIN 25 MG PO CAPS: 25.0000 mg | ORAL_CAPSULE | Freq: Three times a day (TID) | ORAL | 3 refills | Status: DC

## 2017-03-20 MED ORDER — OXYCODONE HCL 15 MG PO TABS: 15.0000 mg | ORAL_TABLET | Freq: Three times a day (TID) | ORAL | 0 refills | Status: DC

## 2017-03-20 NOTE — Patient Instructions (Signed)
Great to see you.  We are changing your Lyrica to 25 mg three times daily.  Please keep me updated.

## 2017-03-20 NOTE — Assessment & Plan Note (Signed)
Change lyrica to 25 mg three times daily. Call or return to clinic prn if these symptoms worsen or fail to improve as anticipated. The patient indicates understanding of these issues and agrees with the plan.

## 2017-03-20 NOTE — Progress Notes (Signed)
Subjective:    Patient ID: Mariah Reed, female    DOB: [birth year], 72 y.o.   MRN: [id number]  HPI  Mariah Reed is a 72 year old female new to me who presents today to follow up neuropathy and medication refill of her oxycodone.    Chronic Back Pain/neuropathy: Currently managed on Lyrica, Flector Patches 1.3% and Oxycodone 15 mg every 8 hours.  She feels her current dose of lyrica is making her weak and wants to try a lower dose.  Currently taking Lyrica 100 mg three times daily.   Review of Systems  Constitutional: Negative for fever.  HENT: Positive for congestion and sinus pressure. Negative for sore throat.   Respiratory: Negative for cough and shortness of breath.   Cardiovascular: Negative for chest pain.  Musculoskeletal:       Chronic back pain       Past Medical History:  Diagnosis Date  . Allergy   . COPD (chronic obstructive pulmonary disease) ([place])   . Diabetes mellitus   . Gout   . Hyperlipidemia   . Hypertension   . Low back pain   . Peripheral neuropathy      Social History   Socioeconomic History  . Marital status: Legally Separated    Spouse name: Mariah Reed  . Number of children: Not on file  . Years of education: Not on file  . Highest education level: Not on file  Social Needs  . Financial resource strain: Not on file  . Food insecurity - worry: Not on file  . Food insecurity - inability: Not on file  . Transportation needs - medical: Not on file  . Transportation needs - non-medical: Not on file  Occupational History    Employer: DISABLED  Tobacco Use  . Smoking status: Never Smoker  . Smokeless tobacco: Never Used  Substance and Sexual Activity  . Alcohol use: No  . Drug use: No  . Sexual activity: Not on file  Other Topics Concern  . Not on file  Social History Narrative   Pt would like Mariah Reed at [phone] (nephew) to be called in case of emergency and ask him to call her daughter Mariah Reed at [phone].      Past Surgical History:  Procedure Laterality Date  . ABDOMINAL HYSTERECTOMY    . POLYPECTOMY     Colon  . TUBAL LIGATION      Family History  Problem Relation Age of Onset  . Uterine cancer Mother     Allergies  Allergen Reactions  . Levofloxacin Hives    Current Outpatient Medications on File Prior to Visit  Medication Sig Dispense Refill  . ACCU-CHEK FASTCLIX LANCETS MISC 1 Device by Does not apply route 3 (three) times daily. Use to check blood sugar up to three times a day 102 each 12  . albuterol (PROVENTIL HFA;VENTOLIN HFA) 108 (90 Base) MCG/ACT inhaler Inhale 2 puffs into the lungs every 4 (four) hours as needed for wheezing or shortness of breath. Please provide her with Ventolin due to insurance 1 Inhaler 2  . esomeprazole (NEXIUM) 40 MG capsule 1 tab by mouth daily 90 capsule 3  . fexofenadine (ALLEGRA) 180 MG tablet Take 180 mg by mouth as needed.      Marland Kitchen FLECTOR 1.3 % PTCH APPLY ONE PATCH TO THE SKIN AS INSTRUCTED (UP TO TWO DAY) 60 patch 0  . fluticasone (FLONASE) 50 MCG/ACT nasal spray INHALE 2 SPRAYS IN EACH NOSTRIL DAILY 16 g 11  .  glipiZIDE (GLUCOTROL) 10 MG tablet TAKE 1 TABLET BY MOUTH TWICE DAILY BEFORE A MEAL 60 tablet 0  . hydrALAZINE (APRESOLINE) 10 MG tablet TAKE 1 TABLET BY MOUTH 3 TIMES A DAY FORBLOOD PRESSURE 90 tablet 0  . Insulin Glargine (LANTUS) 100 UNIT/ML Solostar Pen Inject 10 Units into the skin daily at 10 pm. 15 mL 11  . Insulin Pen Needle (ULTICARE MINI PEN NEEDLES) 31G X 6 MM MISC USE PEN NEEDLES TO ADMINISTER LANTUS. DIAGNOSIS CODE E11.8 100 each PRN  . levothyroxine (SYNTHROID, LEVOTHROID) 75 MCG tablet TAKE 1 TABLET BY MOUTH ONCE DAILY 30 tablet 1  . lovastatin (MEVACOR) 40 MG tablet TAKE 1 TABLET BY MOUTH EVERY NIGHT AT BEDTIME 90 tablet 3  . PATADAY 0.2 % SOLN PLACE 1 DROP INTO BOTH EYES EVERY DAY 2.5 mL 2  . tiotropium (SPIRIVA HANDIHALER) 18 MCG inhalation capsule Place 1 capsule (18 mcg total) into inhaler and inhale daily. 30  capsule 5  . topiramate (TOPAMAX) 100 MG tablet TAKE 3 TABLETS BY MOUTH ONCE DAILY 90 tablet 3  . zolpidem (AMBIEN) 10 MG tablet TAKE ONE TABLET BY MOUTH AT BEDTIME 30 tablet 0  . Alum & Mag Hydroxide-Simeth (MAGIC MOUTHWASH W/LIDOCAINE) SOLN Swish spit 5 ml four times daily as needed. (Patient not taking: Reported on 02/11/2017) 240 mL 0  . diphenhydrAMINE (BENADRYL) 25 MG tablet Take 25 mg by mouth as needed.      Marland Kitchen estrogens, conjugated, (PREMARIN) 0.625 MG tablet Take 1 tablet (0.625 mg total) by mouth daily. Take daily for 21 days then do not take for 7 days. (Patient not taking: Reported on 03/20/2017) 30 tablet 0  . furosemide (LASIX) 20 MG tablet Take 1 tablet (20 mg total) by mouth daily as needed. (Patient not taking: Reported on 03/20/2017) 30 tablet 4  . hydrOXYzine (ATARAX/VISTARIL) 50 MG tablet Take 1 tablet (50 mg total) by mouth 3 (three) times daily as needed. (Patient not taking: Reported on 03/20/2017) 90 tablet 2  . metoprolol (LOPRESSOR) 50 MG tablet TAKE 1 TABLET BY MOUTH TWICE A DAY AS NEEDED FOR INCREASED HEART RATE (Patient not taking: Reported on 03/20/2017) 60 tablet 5  . nitroGLYCERIN (NITROSTAT) 0.4 MG SL tablet DISSOLVE 1 TABLET UNDER TONGUE AS NEEDEDFOR CHEST PAIN. MAY REPEAT 5 MINUTES APART 3 TIMES IF NEEDED (Patient not taking: Reported on 02/11/2017) 25 tablet 5  . nortriptyline (PAMELOR) 10 MG capsule TAKE 1 CAPSULE BY MOUTH EVERY NIGHT AT BEDTIME (Patient not taking: Reported on 03/20/2017) 30 capsule 3  . nystatin (MYCOSTATIN) 100000 UNIT/ML suspension TAKE 1 TEASPOONFUL BY MOUTH 4 TIMES DAILY AS DIRECTED (Patient not taking: Reported on 03/20/2017) 60 mL 6  . nystatin cream (MYCOSTATIN) APPLY TO AFFECTED AREAS ONCE DAILY AS NEEDED (Patient not taking: Reported on 03/20/2017) 30 g 0  . sitaGLIPtin (JANUVIA) 100 MG tablet Take 1 tablet (100 mg total) by mouth daily. (Patient not taking: Reported on 03/20/2017) 30 tablet 3   No current facility-administered medications on file  prior to visit.     BP 136/70 (BP Location: Right Arm, Patient Position: Sitting, Cuff Size: Normal)   Pulse 97   Temp 98.4 F (36.9 C) (Oral)   Ht 5\' 6"  (1.676 m)   Wt 200 lb 12.8 oz (91.1 kg)   SpO2 94%   BMI 32.41 kg/m    Objective:   Physical Exam  Constitutional: She appears well-nourished. She does not appear ill.  HENT:  Right Ear: Tympanic membrane and ear canal normal.  Left Ear:  Tympanic membrane and ear canal normal.  Nose: Right sinus exhibits no maxillary sinus tenderness and no frontal sinus tenderness. Left sinus exhibits no maxillary sinus tenderness and no frontal sinus tenderness.  Mouth/Throat: Oropharynx is clear and moist.  Eyes: Conjunctivae are normal.  Neck: Neck supple.  Cardiovascular: Normal rate and regular rhythm.  Pulmonary/Chest: Effort normal and breath sounds normal. She has no wheezes. She has no rales.  Lymphadenopathy:    She has no cervical adenopathy.  Skin: Skin is warm and dry.          Assessment & Plan:    Arnette Norris, MD

## 2017-03-20 NOTE — Assessment & Plan Note (Signed)
Indication for chronic opioid: chronic OA, spinal stenosis, DDD, left hand deformity Medication and dose: oxycodone 15 mg - 1 tab every 8 hours as needed for pain # pills per month: 90 Last UDS date: 10/03/16 Pain contract signed (Y/N): y Date narcotic database last reviewed (include red flags): 10/03/16

## 2017-03-22 ENCOUNTER — Other Ambulatory Visit: Payer: Self-pay | Admitting: Family Medicine

## 2017-03-25 ENCOUNTER — Other Ambulatory Visit: Payer: Self-pay

## 2017-03-25 ENCOUNTER — Telehealth: Payer: Self-pay | Admitting: Family Medicine

## 2017-03-25 MED ORDER — PREGABALIN 25 MG PO CAPS: 25.0000 mg | ORAL_CAPSULE | Freq: Three times a day (TID) | ORAL | 3 refills | Status: DC

## 2017-03-25 NOTE — Telephone Encounter (Signed)
See pt. Request. Thank you.

## 2017-03-25 NOTE — Telephone Encounter (Signed)
Rx was reprinted and placed in TA's office for signature/will fax when signed/thx dmf

## 2017-03-25 NOTE — Telephone Encounter (Signed)
Copied from [place] [id number]. Topic: Inquiry >> Mar 25, 2017  7:00 AM Mariah Reed wrote: Reason for CRM: Patient called stating that she was seen by Dr. Deborra Medina on last Wednesday, 03/20/2017. Patient says that Dr. Deborra Medina has changed her Lyrica prescription to 25mg , 3 times a day. However, patient states that the Lyrica was never sent to her pharmacy. Patient would like the Lyrica 25mg  prescription sent to [place], [place] [phone] (Phone) [phone] (Fax).       Thank You!!!

## 2017-03-25 NOTE — Progress Notes (Signed)
Reprinted to fax to pharm after provider signs/thx dmf

## 2017-03-26 ENCOUNTER — Ambulatory Visit: Payer: Medicare Other | Admitting: Internal Medicine

## 2017-03-26 NOTE — Telephone Encounter (Signed)
Rx faxed/thx dmf

## 2017-04-02 ENCOUNTER — Other Ambulatory Visit: Payer: Self-pay | Admitting: Family Medicine

## 2017-04-04 ENCOUNTER — Other Ambulatory Visit: Payer: Self-pay | Admitting: Family Medicine

## 2017-04-04 MED ORDER — ZOLPIDEM TARTRATE 10 MG PO TABS: 10.0000 mg | ORAL_TABLET | Freq: Every day | ORAL | 0 refills | Status: DC

## 2017-04-04 NOTE — Telephone Encounter (Signed)
I called the Ambien in to the pharmacist, it looks like patient was decreased to the 25mg  Lyrica?

## 2017-04-04 NOTE — Addendum Note (Signed)
Addended by: Kateri Mc E on: 04/04/2017 04:12 PM   Modules accepted: Orders

## 2017-04-04 NOTE — Telephone Encounter (Signed)
Copied from [place]. Topic: Quick Communication - Rx Refill/Question >> Apr 04, 2017 12:30 PM Cecelia Byars, NT wrote: Medication: zolpidem (AMBIEN) 10 MG tablet Has the patient contacted their pharmacy? yes (Agent: If no, request that the patient contact the pharmacy for the refill. Preferred Pharmacy (with phone number or street name  [place], [place] [phone] Phone [phone] Agent: Please be advised that RX refills may take up to 3 business days. We ask that you follow-up with your pharmacy.

## 2017-04-05 NOTE — Telephone Encounter (Signed)
LOV 03/20/17 Dr. [facility]

## 2017-04-08 ENCOUNTER — Telehealth: Payer: Self-pay | Admitting: Family Medicine

## 2017-04-08 NOTE — Telephone Encounter (Signed)
Copied from [place] [id number]. Topic: Quick Communication - See Telephone Encounter >> Apr 08, 2017  2:50 PM Vernona Rieger wrote: CRM for notification. See Telephone encounter for:   04/08/17.  Pt wants to be switched from nortriptyline (PAMELOR) 10 MG capsule to nortriptyline (PAMELOR) 50 MG capsule  [facility] [place] [address]

## 2017-04-09 ENCOUNTER — Ambulatory Visit: Payer: Self-pay

## 2017-04-09 ENCOUNTER — Telehealth: Payer: Self-pay | Admitting: Family Medicine

## 2017-04-09 NOTE — Telephone Encounter (Signed)
Patient called in for medication requests and mentioned about having swelling to buttock up to back. I asked about the swelling, she says "It's nothing new, it's from my neuropathy. I just want the doctor to know that I took Mylanta and took Nortriptylline 10 mg x 5 pills to equal 50 mg and it did not bother my stomach. So, if she can call in a prescription for 50 mg once a day. Also, I need to take Lyrica 50 mg twice a day and will need a prescription for that." I asked is she having swelling in her legs, she said "I don't think so. It's in my buttocks and up my back. I've been using a heating pad and that seems to help with the pain. If I monitor the swelling and it gets worse to the point I can't walk from the pain, do I call the office back or call the hospital?" I advised if she is in excruciating pain and is unable to walk, to call 911 to take her to the hospital for evaluation. She asked "will my doctor meet me there?" I explained that her provider will not be at the hospital to treat her, but she can come to the office for an appointment. She says "I don't have a way to get all the way out there, so I will just keep an eye on everything. If she can just call in these medicines, I believe I will be alright." I offered to make an appointment to evaluate her swelling, she declined the appointment, she says "I have one to see her in about 4 months. If I get worse, I will call 911."

## 2017-04-09 NOTE — Telephone Encounter (Signed)
TA-Plz see phone note below about pt req for increase of Nortriptyline/thx dmf

## 2017-04-09 NOTE — Telephone Encounter (Signed)
TA-Pt also requesting for Lyrica to be increased to 50mg  bid/plz advise/thx dmf

## 2017-04-09 NOTE — Telephone Encounter (Signed)
Okay to refill both with changes as requested.  Thank you.

## 2017-04-09 NOTE — Telephone Encounter (Signed)
Copied from [place] [phone]. Topic: Quick Communication - See Telephone Encounter >> Apr 09, 2017 10:46 AM Arletha Grippe wrote: CRM for notification. See Telephone encounter for:   04/09/17. Pt would like to take 50 mg twice a day this is for lyrica .  She is not bothered by this - no side effects [phone]  nortriptyline (PAMELOR) 10 MG capsule - pt would like to change dose to 50 mg per day. She has found if she takes mylanta she doesn't have side effects   [facility]

## 2017-04-10 MED ORDER — PREGABALIN 50 MG PO CAPS: 50.0000 mg | ORAL_CAPSULE | Freq: Two times a day (BID) | ORAL | 2 refills | Status: DC

## 2017-04-10 MED ORDER — NORTRIPTYLINE HCL 50 MG PO CAPS: 50.0000 mg | ORAL_CAPSULE | Freq: Every day | ORAL | 1 refills | Status: DC

## 2017-04-10 NOTE — Telephone Encounter (Signed)
Per TA changes made/sent nortriptyline 50mg  1qhs to pharm/will fax signed Lyrica Rx on 3.7.19/thx dmf

## 2017-04-10 NOTE — Addendum Note (Signed)
Addended by: Marrion Coy on: 04/10/2017 04:30 PM   Modules accepted: Orders

## 2017-04-12 ENCOUNTER — Ambulatory Visit: Payer: Self-pay | Admitting: *Deleted

## 2017-04-12 NOTE — Telephone Encounter (Signed)
Pt called with swelling in her buttocks and going up her left back. She states it is painful and burning.  She says it is from her neuropathy. She is using the flector patches to the area, a heating pad and ice to the area. She is also taking Lyrica. It helps a little. She feels like the swelling has gotten worse.  She is requesting a med to be called in for her. An appointment made for Tuesday. Only wants to see Dr. Deborra Medina.  No protocol found.  Will send this to the office.  Pt advised that if she start feeling worst or gets to where she can not walk to call 911. Pt voiced understanding.  Answer Assessment - Initial Assessment Questions 1. APPEARANCE of SWELLING: "What does it look like?" (e.g., lymph node, insect bite, mole)     Funny looking butt 2. SIZE: "How large is the swelling?" (inches, cm or compare to coins)     Goes all the way up the left back from the buttocks 3. LOCATION: "Where is the swelling located?"     Left side 4. ONSET: "When did the swelling start?"     Last saturday 5. PAIN: "Is it painful?" If so, ask: "How much?"     Yes, # 10 6. ITCH: "Does it itch?" If so, ask: "How much?"     no 7. CAUSE: "What do you think caused the swelling?"     neuropathy 8. OTHER SYMPTOMS: "Do you have any other symptoms?" (e.g., fever)     no  Protocols used: SKIN LUMP OR LOCALIZED SWELLING-A-AH

## 2017-04-12 NOTE — Telephone Encounter (Signed)
FYI

## 2017-04-15 ENCOUNTER — Ambulatory Visit: Payer: Self-pay | Admitting: *Deleted

## 2017-04-15 NOTE — Telephone Encounter (Signed)
Pt   Reports  She  Has  Pain   And  Swelling   And   l  Side      Pt    Denies   Any  Recent  Injury  Pt  Has  History    Of  Neuropathy   Pt  Has  Been   Applying Flector   patches   To  Her   Back   Denies   Any  Fever   Pt  Denies  Any  Urinary   Symptoms .  Pt  Reports  The  Pain     Is  Constant  Pain . Pt   Has   An  Appointment  tomorrow   In the   Office  But the  Patient   Cancelled  The  Appointment   Reports  She   Reports   She  Has  No  Way   To  The  Office .   Pt  Is  Requesting  There  Is  Anything  That  Can  Be   Called  In to  The  Pharmacy     Pt  Has    Had    The pain  In her buttocks   For  7  Day.   Pt   States   She  May  On  Her  Own  Choice  Go to  [facility]

## 2017-04-16 ENCOUNTER — Ambulatory Visit: Payer: Medicare Other | Admitting: Family Medicine

## 2017-04-16 ENCOUNTER — Other Ambulatory Visit: Payer: Self-pay | Admitting: Family Medicine

## 2017-04-16 DIAGNOSIS — G629 Polyneuropathy, unspecified: Secondary | ICD-10-CM | POA: Diagnosis not present

## 2017-04-16 DIAGNOSIS — M7918 Myalgia, other site: Secondary | ICD-10-CM | POA: Diagnosis not present

## 2017-04-17 ENCOUNTER — Telehealth: Payer: Self-pay | Admitting: Family Medicine

## 2017-04-17 NOTE — Telephone Encounter (Signed)
TA-Plz see note below/she was seen at [facility] on 3.12.19 instead of coming to her appt here/she is adamant about not coming in for appt and is wanting you to Rx her Prednisone per the MD @ [facility] suggestion/states that she was Rx'ed Lyrica (which she is already on) and added Gabapentin  I have faxed a request for the visit note so that you can actually see what was done/plz advise if you would like for me to do anything prior to these records coming in/thx dmf

## 2017-04-17 NOTE — Telephone Encounter (Signed)
Thank you.  I would like to see the office notes prior to adding any rx.  Thanks!

## 2017-04-17 NOTE — Telephone Encounter (Signed)
Copied from [place] [id number]. Topic: Quick Communication - See Telephone Encounter >> Apr 17, 2017 10:00 AM Arletha Grippe wrote: CRM for notification. See Telephone encounter for:   04/17/17. Pt went to urgent care [facility] in [place] on 04/16/17/  she was prescribed gabapentin and lyrica to help with neuropathy pain and he told the patient that she may be a candidate for prednisone, but he did not prescribe it, because it may take her sugar up.  Pt is asking for prednisone to help with her pain.  Please call  [phone]  Pharm is [facility]  Pt declined to come in for appt, stating that you already are aware of her condition.

## 2017-04-17 NOTE — Telephone Encounter (Signed)
Pt went to [facility] on 04/16/17/  where she was prescribed gabapentin and lyrica for "neuropathy." Per patient, MD at clinic stated she may be a candidate for prednisone, but he did not prescribe it, as it may raise her blood sugar.  Pt is asking for prednisone Rx. called in by PCP.  Pt seen in office 03/20/17 with Dr. Deborra Medina. Pt was triaged on 3/5   3/8  3/11 for pain issues. Continues to decline appt with PCP, stating "You are already aware of my condition."   Please advise: [phone]

## 2017-04-22 ENCOUNTER — Other Ambulatory Visit: Payer: Self-pay | Admitting: Family Medicine

## 2017-04-22 NOTE — Telephone Encounter (Signed)
Patient said she is checking on the status of getting something to help get the swelling out of her back. Please call patient. She said she tried out the Lyrica & Gabapentin. [phone]

## 2017-04-22 NOTE — Telephone Encounter (Signed)
TA-I just received notes from [facility]/states:  Eval of left buttock with female nursing asst reveals no skin changes, minimal swelling, pain to palpation and with ROM.  Skin warm and dry no rash noted.  She will take Gabapentin 100mg  bid-tid/I have it in your office for review/plz advise/thx dmf

## 2017-04-23 ENCOUNTER — Ambulatory Visit: Payer: Self-pay | Admitting: *Deleted

## 2017-04-23 NOTE — Telephone Encounter (Signed)
Pt reported on 2.13.19 that not using/must call the office to discuss/thx dmf

## 2017-04-23 NOTE — Telephone Encounter (Signed)
TA-Plz see Triage note below/pt is stating having some bleeding after a BM/In the past has taken Flagyl and it helped?  States that she feels that she has yeast built up in her colon that is the causative for the pain/She is requesting Nystatin liquid (which I know I refused earlier because she said last month at [facility] that she was not taking it)/Also asking to try Prednisone (I believe that has to do with the visit to [facility] which did not state anything about Prednisone if I am not mistaken)/Plz advise/thx dmf

## 2017-04-23 NOTE — Telephone Encounter (Signed)
Noted! Thank you

## 2017-04-23 NOTE — Telephone Encounter (Signed)
Pt called because she has a bowel movement and then has some rectal bleeding. She states she has had this before and took Flagyl and it helped. She feels like she has yeast built up in her colon and causing it to hurt.  She is requesting Nystatin liquid to help with this. She had reported in the office that she was not taking this (03/20/17). She wants to know if Flaygl and Nystatin could be called in. She is also wondering if she should try prednisone . Pt was told that she may have to be seen before these meds could be ordered for her. She states that she is just too sick and having pain and swelling in her buttocks and back to go to the office. Her last office visit was 03/20/17.  No protocol found for her symptoms.  Will notify flow at [place] at [facility].  Answer Assessment - Initial Assessment Questions 1. APPEARANCE of BLOOD: "What color is it?" "Is it passed separately, on the surface of the stool, or mixed in with the stool?"  Blood mixed in with stool 2. AMOUNT: "How much blood was passed?"      A lot and blood over tissue 3. FREQUENCY: "How many times has blood been passed with the stools?"      First time today 4. ONSET: "When was the blood first seen in the stools?" (Days or weeks)      Months ago 5. DIARRHEA: "Is there also some diarrhea?" If so, ask: "How many diarrhea stools were passed in past 24 hours?"      no 6. CONSTIPATION: "Do you have constipation?" If so, "How bad is it?"     Yes, not bad 7. RECURRENT SYMPTOMS: "Have you had blood in your stools before?" If so, ask: "When was the last time?" and "What happened that time?"      Yes, months ago 8. BLOOD THINNERS: "Do you take any blood thinners?" (e.g., Coumadin/warfarin, Pradaxa/dabigatran, aspirin)     no 9. OTHER SYMPTOMS: "Do you have any other symptoms?"  (e.g., abdominal pain, vomiting, dizziness, fever)     abd pain 10. PREGNANCY: "Is there any chance you are pregnant?" "When was your last  menstrual period?"       no  Protocols used: RECTAL BLEEDING-A-AH

## 2017-04-23 NOTE — Telephone Encounter (Signed)
I have scheduled pt for appt for 21st at 12:15pm/I will call [facility] to see if they can transport her at [phone] she can only ride in a car/if need to can call [phone] for Dept of SS/will call pt back after I call them in am/thx dmf

## 2017-04-23 NOTE — Telephone Encounter (Signed)
I really do not feel comfortable sending in these rxs without her being evaluated.  Can someone in [place] or [place] see her tomorrow since I am not there in the morning? Thanks!

## 2017-04-24 ENCOUNTER — Telehealth: Payer: Self-pay | Admitting: Family Medicine

## 2017-04-24 NOTE — Telephone Encounter (Signed)
I tried to call [facility] again and there was no answer/I called the health dept at [phone] and there was no answer/I LMOVM req that they return my call ASAP as pt needs transportation to an appt that is nec on 3.21.19 @ 12:15pm and she must ride in a car as a Printmaker jostles her too much with the state her back is in/thx dmf

## 2017-04-24 NOTE — Telephone Encounter (Signed)
Pt has gotten transportation for OV/thx dmf

## 2017-04-24 NOTE — Telephone Encounter (Signed)
Copied from [place] [id number]. Topic: Quick Communication - See Telephone Encounter >> Apr 24, 2017 11:38 AM Synthia Innocent wrote: CRM for notification. See Telephone encounter for:  Follow up from Nurse Triage nurse call on 04/23/17, rectal bleeding has stopped, back pain is still bad, checking status of transportation to appt on 04/25/17 04/24/17.

## 2017-04-24 NOTE — Telephone Encounter (Signed)
Pt called back to adv that she has transportation for her appt on 04/25/17

## 2017-04-25 ENCOUNTER — Telehealth: Payer: Self-pay | Admitting: Family Medicine

## 2017-04-25 ENCOUNTER — Ambulatory Visit (INDEPENDENT_AMBULATORY_CARE_PROVIDER_SITE_OTHER): Payer: Medicare Other | Admitting: Family Medicine

## 2017-04-25 DIAGNOSIS — M545 Low back pain: Secondary | ICD-10-CM | POA: Diagnosis not present

## 2017-04-25 MED ORDER — PREGABALIN 100 MG PO CAPS: 100.0000 mg | ORAL_CAPSULE | Freq: Two times a day (BID) | ORAL | 3 refills | Status: DC

## 2017-04-25 MED ORDER — NYSTATIN 100000 UNIT/ML MT SUSP: OROMUCOSAL | 6 refills | Status: DC

## 2017-04-25 NOTE — Telephone Encounter (Signed)
Pt returning call to Dr. Hulen Shouts assistant. Do not see phone note up for today, please call pt back.

## 2017-04-25 NOTE — Patient Instructions (Addendum)
Great to see you. We are changing Lyrica to 100 mg twice daliy.  Please come see me in 2 weeks.

## 2017-04-25 NOTE — Progress Notes (Signed)
Subjective:   Patient ID: Mariah Reed, female    DOB: Mar 02, 1945, 72 y.o.   MRN: [id number]  Mariah Reed is a pleasant 72 y.o. year old female who presents to clinic today with Back Pain (two weeks of left side back pain, goes around to front lower abdominal area. pain is unbearable. Neurotin and Lyrica imake it bearable but in constant pain.)  on 04/25/2017  HPI:  Martin Majestic to [facility] for this complaint on 04/16/17 for this complaint. Note reviewed.  Advised to continue with current rxs and to follow up with me.  Current Outpatient Medications on File Prior to Visit  Medication Sig Dispense Refill  . ACCU-CHEK FASTCLIX LANCETS MISC 1 Device by Does not apply route 3 (three) times daily. Use to check blood sugar up to three times a day 102 each 12  . diphenhydrAMINE (BENADRYL) 25 MG tablet Take 25 mg by mouth as needed.      Marland Kitchen esomeprazole (NEXIUM) 40 MG capsule 1 tab by mouth daily 90 capsule 3  . fexofenadine (ALLEGRA) 180 MG tablet Take 180 mg by mouth as needed.      Marland Kitchen FLECTOR 1.3 % PTCH APPLY 1 PATCH TO THE SKIN AS DIRECTED UPTO 2 PER DAY. 60 patch 1  . fluticasone (FLONASE) 50 MCG/ACT nasal spray INHALE 2 SPRAYS IN EACH NOSTRIL DAILY 16 g 11  . glipiZIDE (GLUCOTROL) 10 MG tablet TAKE 1 TABLET BY MOUTH TWICE DAILY BEFORE A MEAL 60 tablet 2  . hydrALAZINE (APRESOLINE) 10 MG tablet TAKE 1 TABLET BY MOUTH 3 TIMES A DAY FORBLOOD PRESSURE 90 tablet 2  . Insulin Glargine (LANTUS) 100 UNIT/ML Solostar Pen Inject 10 Units into the skin daily at 10 pm. 15 mL 11  . Insulin Pen Needle (ULTICARE MINI PEN NEEDLES) 31G X 6 MM MISC USE PEN NEEDLES TO ADMINISTER LANTUS. DIAGNOSIS CODE E11.8 100 each PRN  . levothyroxine (SYNTHROID, LEVOTHROID) 75 MCG tablet TAKE 1 TABLET BY MOUTH ONCE DAILY 30 tablet 2  . lovastatin (MEVACOR) 40 MG tablet TAKE 1 TABLET BY MOUTH EVERY NIGHT AT BEDTIME 90 tablet 3  . metoprolol (LOPRESSOR) 50 MG tablet TAKE 1 TABLET BY MOUTH TWICE A DAY AS NEEDED FOR  INCREASED HEART RATE 60 tablet 5  . nitroGLYCERIN (NITROSTAT) 0.4 MG SL tablet DISSOLVE 1 TABLET UNDER TONGUE AS NEEDEDFOR CHEST PAIN. MAY REPEAT 5 MINUTES APART 3 TIMES IF NEEDED 25 tablet 5  . nortriptyline (PAMELOR) 50 MG capsule Take 1 capsule (50 mg total) by mouth at bedtime. 90 capsule 1  . nystatin cream (MYCOSTATIN) APPLY TO AFFECTED AREAS ONCE DAILY AS NEEDED 30 g 0  . oxyCODONE (ROXICODONE) 15 MG immediate release tablet Take 1 tablet (15 mg total) by mouth 3 (three) times daily. 90 tablet 0  . PATADAY 0.2 % SOLN PLACE 1 DROP INTO BOTH EYES EVERY DAY 2.5 mL 2  . pregabalin (LYRICA) 50 MG capsule Take 1 capsule (50 mg total) by mouth 2 (two) times daily. 60 capsule 2  . SPIRIVA HANDIHALER 18 MCG inhalation capsule INHALE 1 CAPSULE INTO THE INHALER ONCE ADAY AND INHALE DAILY 30 capsule 2  . topiramate (TOPAMAX) 100 MG tablet TAKE 3 TABLETS BY MOUTH ONCE DAILY 90 tablet 3  . VENTOLIN HFA 108 (90 Base) MCG/ACT inhaler INHALE 2 PUFFS EVERY 4 HOURS AS NEEDED 18 g 0  . zolpidem (AMBIEN) 10 MG tablet Take 1 tablet (10 mg total) by mouth at bedtime. 30 tablet 0  . Alum & Mag Hydroxide-Simeth (  MAGIC MOUTHWASH W/LIDOCAINE) SOLN Swish spit 5 ml four times daily as needed. (Patient not taking: Reported on 02/11/2017) 240 mL 0  . estrogens, conjugated, (PREMARIN) 0.625 MG tablet Take 1 tablet (0.625 mg total) by mouth daily. Take daily for 21 days then do not take for 7 days. (Patient not taking: Reported on 03/20/2017) 30 tablet 0  . furosemide (LASIX) 20 MG tablet Take 1 tablet (20 mg total) by mouth daily as needed. (Patient not taking: Reported on 03/20/2017) 30 tablet 4  . hydrOXYzine (ATARAX/VISTARIL) 50 MG tablet Take 1 tablet (50 mg total) by mouth 3 (three) times daily as needed. (Patient not taking: Reported on 03/20/2017) 90 tablet 2  . nystatin (MYCOSTATIN) 100000 UNIT/ML suspension TAKE 1 TEASPOONFUL BY MOUTH 4 TIMES DAILY AS DIRECTED (Patient not taking: Reported on 03/20/2017) 60 mL 6  .  sitaGLIPtin (JANUVIA) 100 MG tablet Take 1 tablet (100 mg total) by mouth daily. (Patient not taking: Reported on 03/20/2017) 30 tablet 3   No current facility-administered medications on file prior to visit.     Allergies  Allergen Reactions  . Levofloxacin Hives    Past Medical History:  Diagnosis Date  . Allergy   . COPD (chronic obstructive pulmonary disease) ([place])   . Diabetes mellitus   . Gout   . Hyperlipidemia   . Hypertension   . Low back pain   . Peripheral neuropathy     Past Surgical History:  Procedure Laterality Date  . ABDOMINAL HYSTERECTOMY    . POLYPECTOMY     Colon  . TUBAL LIGATION      Family History  Problem Relation Age of Onset  . Uterine cancer Mother     Social History   Socioeconomic History  . Marital status: Legally Separated    Spouse name: Alayza Pieper  . Number of children: Not on file  . Years of education: Not on file  . Highest education level: Not on file  Occupational History    Employer: DISABLED  Social Needs  . Financial resource strain: Not on file  . Food insecurity:    Worry: Not on file    Inability: Not on file  . Transportation needs:    Medical: Not on file    Non-medical: Not on file  Tobacco Use  . Smoking status: Never Smoker  . Smokeless tobacco: Never Used  Substance and Sexual Activity  . Alcohol use: No  . Drug use: No  . Sexual activity: Not on file  Lifestyle  . Physical activity:    Days per week: Not on file    Minutes per session: Not on file  . Stress: Not on file  Relationships  . Social connections:    Talks on phone: Not on file    Gets together: Not on file    Attends religious service: Not on file    Active member of club or organization: Not on file    Attends meetings of clubs or organizations: Not on file    Relationship status: Not on file  . Intimate partner violence:    Fear of current or ex partner: Not on file    Emotionally abused: Not on file    Physically abused: Not  on file    Forced sexual activity: Not on file  Other Topics Concern  . Not on file  Social History Narrative   Pt would like [name] at [phone] (nephew) to be called in case of emergency and ask him to call  her daughter [name] at [phone].   The PMH, PSH, Social History, Family History, Medications, and allergies have been reviewed in [facility], and have been updated if relevant.   Review of Systems  Constitutional: Negative.   Gastrointestinal: Negative.   Musculoskeletal: Positive for back pain.  Skin: Negative.   Psychiatric/Behavioral: Negative.        Objective:    BP (!) 144/80 (BP Location: Right Arm, Patient Position: Sitting, Cuff Size: Large)   Pulse 95   Temp 98.3 F (36.8 C) (Oral)   Ht 5\' 6"  (1.676 m)   Wt 204 lb 12.8 oz (92.9 kg)   SpO2 97%   BMI 33.06 kg/m    Physical Exam    General:  Well-developed,well-nourished,in no acute distress; alert,appropriate and cooperative throughout examination Head:  normocephalic and atraumatic.   Eyes:  vision grossly intact, PERRL Ears:  R ear normal and L ear normal externally, TMs clear bilaterally Nose:  no external deformity.   Mouth:  good dentition.   Neck:  No deformities, masses, or tenderness noted. Lungs:  Normal respiratory effort, chest expands symmetrically. Lungs are clear to auscultation, no crackles or wheezes. Heart:  Normal rate and regular rhythm. S1 and S2 normal without gallop, murmur, click, rub or other extra sounds. Abdomen:  Bowel sounds positive,abdomen soft and non-tender without masses, organomegaly or hernias noted. Msk:  No deformity or scoliosis noted of thoracic or lumbar spine.   Extremities:  No clubbing, cyanosis, edema, or deformity noted with normal full range of motion of all joints.   Neurologic:  alert & oriented X3 and gait normal.   Skin:  Intact without suspicious lesions or rashes Psych:  Cognition and judgment appear intact. Alert and cooperative  with normal attention span and concentration. No apparent delusions, illusions, hallucinations       Assessment & Plan:   No diagnosis found. No follow-ups on file.

## 2017-04-26 ENCOUNTER — Ambulatory Visit: Payer: Self-pay

## 2017-04-26 NOTE — Telephone Encounter (Signed)
Pt calling with c/o passing a large amount of blood with a BM at 1700. Pt stated  That she is unsure as to how much blood passed but said the water was red. Pt stated  That she was calling hoping the doctor could call in some Keflex because "that is what stopped it last time." Pt advised to go to [facility] or ED. Pt asked to call 911 for transportation. Repeatedly advised pt to go to ED or [facility]. Pt refusing to go to the [facility] or ED at this time. Pt stated that she will call ambulance if it continues. Routing back high priority to office.  Reason for Disposition . [1] MODERATE rectal bleeding (small blood clots, passing blood without stool, or toilet water turns red) AND [2] more than once a day  Answer Assessment - Initial Assessment Questions 1. APPEARANCE of BLOOD: "What color is it?" "Is it passed separately, on the surface of the stool, or mixed in with the stool?"      Bright red passed separately 2. AMOUNT: "How much blood was passed?"      Not sure how much the water was red 3. FREQUENCY: "How many times has blood been passed with the stools?"      1 time 4. ONSET: "When was the blood first seen in the stools?" (Days or weeks)      1700 5. DIARRHEA: "Is there also some diarrhea?" If so, ask: "How many diarrhea stools were passed in past 24 hours?"      no 6. CONSTIPATION: "Do you have constipation?" If so, "How bad is it?"     No  7. RECURRENT SYMPTOMS: "Have you had blood in your stools before?" If so, ask: "When was the last time?" and "What happened that time?"      Yes the last time 2 years 8. BLOOD THINNERS: "Do you take any blood thinners?" (e.g., Coumadin/warfarin, Pradaxa/dabigatran, aspirin)     no 9. OTHER SYMPTOMS: "Do you have any other symptoms?"  (e.g., abdominal pain, vomiting, dizziness, fever)     no 10. PREGNANCY: "Is there any chance you are pregnant?" "When was your last menstrual period?"       n/a  Protocols used: RECTAL BLEEDING-A-AH

## 2017-04-27 DIAGNOSIS — M549 Dorsalgia, unspecified: Secondary | ICD-10-CM | POA: Insufficient documentation

## 2017-04-27 NOTE — Assessment & Plan Note (Signed)
Acute on chronic issue. Discussed tx options. Increase Lyrica to 100 mg twice daily.

## 2017-04-29 ENCOUNTER — Telehealth: Payer: Self-pay | Admitting: Family Medicine

## 2017-04-29 NOTE — Telephone Encounter (Unsigned)
Copied from [place] [id number]. Topic: Quick Communication - See Telephone Encounter >> Apr 29, 2017  8:35 AM Hewitt Shorts wrote: CRM for notification. See Telephone encounter for: 04/29/17. Pt is requesting a rx for flagyl  (this medication is not on her medication list   Best number [phone]

## 2017-04-29 NOTE — Telephone Encounter (Signed)
Patient phoned this morning to report she had "a good amount of rectal bleeding on Friday evening." She stated, since then she has only noticed a small amount of blood on her tissue when she wiped. She denies any other symptoms. She stated she has ulcers and that she thinks because she drank strong coffee, along with taking Nortriptyline and Neurontin,(I don't see Neurontin on her med profile) she has caused her ulcer to bleed. Stated she has stopped taking both those meds over the weekend. I offered an appointment but she refused. She is requesting Flagyl to keep the "ulcer from infection" stating this is how it was treated 2 years ago. She was insistent that I send this note and that she would not be calling back because she called Friday after hours and was referred to the ER but refused to go. Please advise patient.

## 2017-04-30 NOTE — Telephone Encounter (Addendum)
Pt calling back from yesterday. Pt thought the call was from today.  Advised the call was from yesterday it appears.   Pt states if it is important, you can call her back. But no one left a message. I do not see any notes that pt was called back after that.

## 2017-04-30 NOTE — Telephone Encounter (Signed)
Thx/noted/dmf

## 2017-05-01 ENCOUNTER — Other Ambulatory Visit: Payer: Self-pay | Admitting: Family Medicine

## 2017-05-02 ENCOUNTER — Other Ambulatory Visit: Payer: Self-pay | Admitting: Family Medicine

## 2017-05-07 ENCOUNTER — Telehealth: Payer: Self-pay | Admitting: Family Medicine

## 2017-05-07 ENCOUNTER — Ambulatory Visit (INDEPENDENT_AMBULATORY_CARE_PROVIDER_SITE_OTHER): Payer: Medicare Other | Admitting: Family Medicine

## 2017-05-07 ENCOUNTER — Encounter: Payer: Self-pay | Admitting: Family Medicine

## 2017-05-07 VITALS — BP 160/80 | HR 82 | Temp 98.0°F | Ht 66.0 in | Wt 201.4 lb

## 2017-05-07 DIAGNOSIS — Z23 Encounter for immunization: Secondary | ICD-10-CM

## 2017-05-07 DIAGNOSIS — M545 Low back pain: Secondary | ICD-10-CM | POA: Diagnosis not present

## 2017-05-07 DIAGNOSIS — E118 Type 2 diabetes mellitus with unspecified complications: Secondary | ICD-10-CM | POA: Diagnosis not present

## 2017-05-07 DIAGNOSIS — G8929 Other chronic pain: Secondary | ICD-10-CM | POA: Diagnosis not present

## 2017-05-07 DIAGNOSIS — Z79899 Other long term (current) drug therapy: Secondary | ICD-10-CM | POA: Insufficient documentation

## 2017-05-07 LAB — POCT UA - MICROALBUMIN: Microalbumin Ur, POC: 80 mg/L

## 2017-05-07 LAB — POCT GLYCOSYLATED HEMOGLOBIN (HGB A1C): HEMOGLOBIN A1C: 8.3

## 2017-05-07 MED ORDER — OXYCODONE HCL ER 20 MG PO T12A: 20.0000 mg | EXTENDED_RELEASE_TABLET | Freq: Two times a day (BID) | ORAL | 0 refills | Status: DC

## 2017-05-07 NOTE — Progress Notes (Signed)
Looked up in [place] controlled database/all is well/thx dmf

## 2017-05-07 NOTE — Patient Instructions (Signed)
Stop taking oxycodone. START taking oxycontin 20 mg twice daily.  Please follow up in 3 months.

## 2017-05-07 NOTE — Assessment & Plan Note (Signed)
Deteriorated.  Chronic Back Pain/neuropathy: Currently managed on Lyrica 100 mg twice daily , Flector Patches 1.3% and Oxycodone 15 mg every 8 hours.  She feels her current dose of lyrica is making her weak and wants to try a lower dose.  Medication and dose:oxycodone 15 mg - 1 tab every 8 hours as needed for pain # pills per month:90 Last UDS date:10/03/16 Pain contract signed (Y/N):y Date narcotic database last reviewed (include red flags):05/07/17  D/c oxycodone- brings in paper rxs which we destroyed. Start oxycontin 20 mg twice daily.  Follow up in 3 months.
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
## 2017-05-07 NOTE — Telephone Encounter (Unsigned)
Copied from [place] [id number]. Topic: Quick Communication - See Telephone Encounter >> May 07, 2017  1:34 PM Hewitt Shorts wrote: CRM for notification. See Telephone encounter for: 05/07/17.pt was seen earlier today and  they forgot to send in the [place] number [phone]

## 2017-05-07 NOTE — Telephone Encounter (Signed)
Pt called, made aware she has 3 refills at RX.

## 2017-05-07 NOTE — Telephone Encounter (Signed)
Copied from [place]. Topic: General - Other >> May 07, 2017 12:56 PM Darl Householder, RMA wrote: Reason for CRM: [place] is requesting a prior authorization for medication oxyCODONE (OXYCONTIN) 20 mg 12 hr tablet

## 2017-05-08 NOTE — Telephone Encounter (Signed)
PA initiated via CMM/also printed and faxed with note stating nature of urgency/thx dmf

## 2017-05-08 NOTE — Telephone Encounter (Signed)
PA approved till 12.31.2019/called pt/she already P/U the medication/thx dmf

## 2017-05-09 ENCOUNTER — Telehealth: Payer: Self-pay

## 2017-05-09 NOTE — Telephone Encounter (Signed)
Letter rec from [place] stating that they will no longer cover albuterol inhaler/PA initiated via CMM/thx dmf

## 2017-05-27 ENCOUNTER — Other Ambulatory Visit: Payer: Self-pay | Admitting: Family Medicine

## 2017-05-28 NOTE — Telephone Encounter (Signed)
Pt was given Rx for 30d with 3 additional refills at appt/thx dmf

## 2017-05-29 ENCOUNTER — Other Ambulatory Visit: Payer: Self-pay | Admitting: Family Medicine

## 2017-06-25 ENCOUNTER — Other Ambulatory Visit: Payer: Self-pay | Admitting: Family Medicine

## 2017-07-08 ENCOUNTER — Other Ambulatory Visit: Payer: Self-pay | Admitting: Family Medicine

## 2017-07-09 NOTE — Telephone Encounter (Signed)
Pt has OV on 6.6.19/will give Rx's in 2 days/thx dmf

## 2017-07-11 ENCOUNTER — Other Ambulatory Visit: Payer: Self-pay | Admitting: Family Medicine

## 2017-07-11 ENCOUNTER — Ambulatory Visit (INDEPENDENT_AMBULATORY_CARE_PROVIDER_SITE_OTHER): Payer: Medicare Other | Admitting: Family Medicine

## 2017-07-11 ENCOUNTER — Other Ambulatory Visit: Payer: Self-pay

## 2017-07-11 ENCOUNTER — Encounter: Payer: Self-pay | Admitting: Family Medicine

## 2017-07-11 VITALS — BP 162/86 | HR 100 | Temp 98.3°F | Ht 66.0 in | Wt 198.4 lb

## 2017-07-11 DIAGNOSIS — M5010 Cervical disc disorder with radiculopathy, unspecified cervical region: Secondary | ICD-10-CM | POA: Diagnosis not present

## 2017-07-11 DIAGNOSIS — F1110 Opioid abuse, uncomplicated: Secondary | ICD-10-CM | POA: Diagnosis not present

## 2017-07-11 LAB — COMPREHENSIVE METABOLIC PANEL
ALBUMIN: 4.3 g/dL (ref 3.5–5.2)
ALK PHOS: 92 U/L (ref 39–117)
ALT: 17 U/L (ref 0–35)
AST: 20 U/L (ref 0–37)
BILIRUBIN TOTAL: 0.5 mg/dL (ref 0.2–1.2)
BUN: 12 mg/dL (ref 6–23)
CO2: 22 mEq/L (ref 19–32)
CREATININE: 0.74 mg/dL (ref 0.40–1.20)
Calcium: 9.5 mg/dL (ref 8.4–10.5)
Chloride: 107 mEq/L (ref 96–112)
GFR: 81.8 mL/min (ref >60.00)
Glucose, Bld: 182 mg/dL — ABNORMAL HIGH (ref 70–99)
POTASSIUM: 4 meq/L (ref 3.5–5.1)
SODIUM: 139 meq/L (ref 135–145)
Total Protein: 7.4 g/dL (ref 6.0–8.3)

## 2017-07-11 MED ORDER — ZOLPIDEM TARTRATE 10 MG PO TABS: 10.0000 mg | ORAL_TABLET | Freq: Every day | ORAL | 3 refills | Status: DC

## 2017-07-11 MED ORDER — GLIPIZIDE 10 MG PO TABS: ORAL_TABLET | ORAL | 3 refills | Status: DC

## 2017-07-11 MED ORDER — FLUTICASONE PROPIONATE 50 MCG/ACT NA SUSP: 2.0000 | Freq: Every day | NASAL | 11 refills | Status: DC

## 2017-07-11 MED ORDER — DICLOFENAC EPOLAMINE 1.3 % TD PTCH: MEDICATED_PATCH | TRANSDERMAL | 3 refills | Status: DC

## 2017-07-11 MED ORDER — OXYCODONE HCL ER 20 MG PO T12A: 20.0000 mg | EXTENDED_RELEASE_TABLET | Freq: Two times a day (BID) | ORAL | 0 refills | Status: DC

## 2017-07-11 MED ORDER — LEVOTHYROXINE SODIUM 75 MCG PO TABS: 75.0000 ug | ORAL_TABLET | Freq: Every day | ORAL | 3 refills | Status: DC

## 2017-07-11 MED ORDER — TIOTROPIUM BROMIDE MONOHYDRATE 18 MCG IN CAPS: ORAL_CAPSULE | RESPIRATORY_TRACT | 3 refills | Status: DC

## 2017-07-11 MED ORDER — ESOMEPRAZOLE MAGNESIUM 40 MG PO CPDR: DELAYED_RELEASE_CAPSULE | ORAL | 3 refills | Status: DC

## 2017-07-11 MED ORDER — LOVASTATIN 40 MG PO TABS: 40.0000 mg | ORAL_TABLET | Freq: Every day | ORAL | 3 refills | Status: DC

## 2017-07-11 MED ORDER — HYDROXYZINE HCL 50 MG PO TABS: 50.0000 mg | ORAL_TABLET | Freq: Three times a day (TID) | ORAL | 3 refills | Status: DC | PRN

## 2017-07-11 MED ORDER — INSULIN GLARGINE 100 UNIT/ML SOLOSTAR PEN: 10.0000 [IU] | PEN_INJECTOR | Freq: Every day | SUBCUTANEOUS | 11 refills | Status: DC

## 2017-07-11 MED ORDER — METOPROLOL TARTRATE 50 MG PO TABS: ORAL_TABLET | ORAL | 5 refills | Status: DC

## 2017-07-11 MED ORDER — PREGABALIN 100 MG PO CAPS: 100.0000 mg | ORAL_CAPSULE | Freq: Two times a day (BID) | ORAL | 3 refills | Status: DC

## 2017-07-11 MED ORDER — TOPIRAMATE 100 MG PO TABS: 300.0000 mg | ORAL_TABLET | Freq: Every day | ORAL | 3 refills | Status: DC

## 2017-07-11 NOTE — Telephone Encounter (Signed)
Req via pharm/thx dmf

## 2017-07-11 NOTE — Assessment & Plan Note (Addendum)
Chronic Back Pain/neuropathy: Currently managed on Lyrica 100 mg twice daily , Flector Patches 1.3% and OxyContin 20 mg every 12 hours.  She feels her current dose of lyrica is making her weak and wants to try a lower dose.  Medication and dose:oxycontin 20 mg every 12 hours # pills per month:60 Last UDS date:10/03/16 Pain contract signed (Y/N):y Date narcotic database last reviewed (include red flags):07/11/17  No changes made today.  Orders Placed This Encounter  Procedures  . Comprehensive metabolic panel

## 2017-07-11 NOTE — Patient Instructions (Signed)
Great to see you. I will call you with your lab results from today and you can view them online.   Please come see me in 4 months.

## 2017-07-11 NOTE — Telephone Encounter (Signed)
Rx given to pt for 100mg /she is not on 50mg /thx dmf
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
## 2017-07-18 ENCOUNTER — Ambulatory Visit: Payer: Medicare Other | Admitting: Family Medicine

## 2017-07-24 ENCOUNTER — Other Ambulatory Visit: Payer: Self-pay | Admitting: Family Medicine

## 2017-08-20 ENCOUNTER — Telehealth: Payer: Self-pay | Admitting: Family Medicine

## 2017-08-22 MED ORDER — PREGABALIN 50 MG PO CAPS: 50.0000 mg | ORAL_CAPSULE | Freq: Two times a day (BID) | ORAL | 2 refills | Status: DC

## 2017-08-22 NOTE — Telephone Encounter (Signed)
TA-Pt is requesting Lyrica be changed back to 50mg  because the 100mg  bid is too strong/plz advise/thx dmf

## 2017-08-22 NOTE — Telephone Encounter (Signed)
Patient called to request her medication be changed back to Lyrica be changed back to 50 mg.  She stated that the 100 mg is just too strong and her body does not feel right when she takes the 100 mg.  Please advise.  CB# [phone].

## 2017-08-22 NOTE — Telephone Encounter (Signed)
Per TA new Rx for Lyrica 50mg  bid was sent to pharm/thx dmf

## 2017-08-22 NOTE — Telephone Encounter (Signed)
Okay to send in new rx as requested.

## 2017-08-22 NOTE — Telephone Encounter (Signed)
Pt is on 100mg  bid/thx dmf

## 2017-09-16 ENCOUNTER — Other Ambulatory Visit: Payer: Self-pay | Admitting: Family Medicine

## 2017-10-08 ENCOUNTER — Other Ambulatory Visit: Payer: Self-pay | Admitting: Family Medicine

## 2017-10-11 ENCOUNTER — Telehealth: Payer: Self-pay | Admitting: Family Medicine

## 2017-10-11 DIAGNOSIS — M5010 Cervical disc disorder with radiculopathy, unspecified cervical region: Secondary | ICD-10-CM

## 2017-10-11 NOTE — Telephone Encounter (Signed)
Order sent and email sent to [facility] attn: Darlina Guys

## 2017-10-11 NOTE — Telephone Encounter (Signed)
Please advise thank you

## 2017-10-11 NOTE — Telephone Encounter (Signed)
I added the order but pended it as I am not sure which of the options she needs for her specific symptoms.  Please call pt to ask her the questions on the order and then okay to sign for me as I have already pended and approved it.  Thank you.

## 2017-10-11 NOTE — Telephone Encounter (Signed)
Order sent and email sent to [facility], Darlina Guys

## 2017-10-11 NOTE — Telephone Encounter (Signed)
Copied from [place] [phone]. Topic: Quick Communication - See Telephone Encounter >> Oct 11, 2017  1:26 PM Synthia Innocent wrote: CRM for notification. See Telephone encounter for: 10/11/17. Requesting script for a hospital bed for DDD. Her inclined in her current has broken. Requesting script asap.

## 2017-10-14 ENCOUNTER — Telehealth: Payer: Self-pay | Admitting: Family Medicine

## 2017-10-14 NOTE — Telephone Encounter (Signed)
Pt called in with questions about ordering a bed for home; she said that information should have sent to [employer]; she says that Sharyn Lull should have sent the information to [employer]; spoke with Kenney Houseman at [facility] and she requests that this information be forwarded to the office; the pt can be contacted at her confidential phone number [phone]; the pt also says that all her diagnosis must be sent to [facility] for medicare/medicaid approval.

## 2017-10-22 ENCOUNTER — Other Ambulatory Visit: Payer: Self-pay | Admitting: Family Medicine

## 2017-11-04 ENCOUNTER — Other Ambulatory Visit: Payer: Self-pay | Admitting: Family Medicine

## 2017-11-05 ENCOUNTER — Encounter: Payer: Self-pay | Admitting: Family Medicine

## 2017-11-05 ENCOUNTER — Ambulatory Visit (INDEPENDENT_AMBULATORY_CARE_PROVIDER_SITE_OTHER): Payer: Medicare Other | Admitting: Family Medicine

## 2017-11-05 VITALS — BP 160/58 | HR 120 | Temp 98.3°F | Ht 66.0 in | Wt 202.6 lb

## 2017-11-05 DIAGNOSIS — Z23 Encounter for immunization: Secondary | ICD-10-CM | POA: Diagnosis not present

## 2017-11-05 DIAGNOSIS — F1190 Opioid use, unspecified, uncomplicated: Secondary | ICD-10-CM | POA: Diagnosis not present

## 2017-11-05 DIAGNOSIS — F1110 Opioid abuse, uncomplicated: Secondary | ICD-10-CM | POA: Diagnosis not present

## 2017-11-05 DIAGNOSIS — M5010 Cervical disc disorder with radiculopathy, unspecified cervical region: Secondary | ICD-10-CM

## 2017-11-05 MED ORDER — OXYCODONE HCL ER 20 MG PO T12A: 20.0000 mg | EXTENDED_RELEASE_TABLET | Freq: Two times a day (BID) | ORAL | 0 refills | Status: DC

## 2017-11-05 MED ORDER — PREGABALIN 50 MG PO CAPS: 50.0000 mg | ORAL_CAPSULE | Freq: Two times a day (BID) | ORAL | 3 refills | Status: DC
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
## 2017-11-05 NOTE — Assessment & Plan Note (Signed)
Currently managed onLyrica 50 mg twice daily,Flector Patches 1.3% and OxyContin 20 mg every 12 hours.

## 2017-11-05 NOTE — Assessment & Plan Note (Addendum)
Medication and dose:oxycontin 20 mg every 12 hours # pills per month:60 Last UDS date:10/03/16 Pain contract signed (Y/N):y Date narcotic database last reviewed (include red flags):11/05/17  UDS and CSC updated today.

## 2017-11-11 LAB — PAIN MGMT, PROFILE 8 W/CONF, U
6 Acetylmorphine: NEGATIVE ng/mL (ref <10)
ALCOHOL METABOLITES: POSITIVE ng/mL — AB (ref <500)
AMPHETAMINES: NEGATIVE ng/mL (ref <500)
BENZODIAZEPINES: NEGATIVE ng/mL (ref <100)
Buprenorphine, Urine: NEGATIVE ng/mL (ref <5)
CODEINE: NEGATIVE ng/mL (ref <50)
Cocaine Metabolite: NEGATIVE ng/mL (ref <150)
Creatinine: 295.4 mg/dL
Ethyl Glucuronide (ETG): NEGATIVE ng/mL (ref <500)
Ethyl Sulfate (ETS): 950 ng/mL — ABNORMAL HIGH (ref <100)
HYDROCODONE: NEGATIVE ng/mL (ref <50)
Hydromorphone: NEGATIVE ng/mL (ref <50)
MARIJUANA METABOLITE: NEGATIVE ng/mL (ref <20)
MDMA: NEGATIVE ng/mL (ref <500)
Morphine: NEGATIVE ng/mL (ref <50)
NOROXYCODONE: 16746 ng/mL — AB (ref <50)
Norhydrocodone: NEGATIVE ng/mL (ref <50)
OPIATES: NEGATIVE ng/mL (ref <100)
OXIDANT: NEGATIVE ug/mL (ref <200)
OXYCODONE: POSITIVE ng/mL — AB (ref <100)
Oxycodone: 6092 ng/mL — ABNORMAL HIGH (ref <50)
Oxymorphone: 7125 ng/mL — ABNORMAL HIGH (ref <50)
PH: 5.6 (ref 4.5–9.0)

## 2017-11-18 ENCOUNTER — Other Ambulatory Visit: Payer: Self-pay | Admitting: Family Medicine

## 2017-12-19 ENCOUNTER — Other Ambulatory Visit: Payer: Self-pay | Admitting: Family Medicine

## 2017-12-19 NOTE — Telephone Encounter (Signed)
TA-LOV 10.01.19/NOV 1.6.20/CSC & UDS UTD/PMP ok no red flags/thx dmf

## 2017-12-24 ENCOUNTER — Other Ambulatory Visit: Payer: Self-pay | Admitting: Family Medicine

## 2018-01-06 ENCOUNTER — Other Ambulatory Visit: Payer: Self-pay | Admitting: Family Medicine

## 2018-01-06 NOTE — Telephone Encounter (Signed)
OK to refill Oxycontin?

## 2018-01-16 ENCOUNTER — Other Ambulatory Visit: Payer: Self-pay | Admitting: Family Medicine
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
## 2018-02-10 ENCOUNTER — Encounter: Payer: Self-pay | Admitting: Family Medicine

## 2018-02-10 ENCOUNTER — Other Ambulatory Visit: Payer: Self-pay | Admitting: Family Medicine

## 2018-02-10 ENCOUNTER — Ambulatory Visit (INDEPENDENT_AMBULATORY_CARE_PROVIDER_SITE_OTHER): Payer: Medicare Other | Admitting: Family Medicine

## 2018-02-10 ENCOUNTER — Telehealth: Payer: Self-pay | Admitting: Family Medicine

## 2018-02-10 VITALS — BP 146/86 | HR 88 | Temp 98.4°F | Ht 66.0 in | Wt 209.0 lb

## 2018-02-10 DIAGNOSIS — M5010 Cervical disc disorder with radiculopathy, unspecified cervical region: Secondary | ICD-10-CM | POA: Diagnosis not present

## 2018-02-10 DIAGNOSIS — E559 Vitamin D deficiency, unspecified: Secondary | ICD-10-CM | POA: Diagnosis not present

## 2018-02-10 DIAGNOSIS — E785 Hyperlipidemia, unspecified: Secondary | ICD-10-CM

## 2018-02-10 DIAGNOSIS — G609 Hereditary and idiopathic neuropathy, unspecified: Secondary | ICD-10-CM

## 2018-02-10 DIAGNOSIS — E038 Other specified hypothyroidism: Secondary | ICD-10-CM

## 2018-02-10 DIAGNOSIS — Z794 Long term (current) use of insulin: Secondary | ICD-10-CM | POA: Diagnosis not present

## 2018-02-10 DIAGNOSIS — M545 Low back pain, unspecified: Secondary | ICD-10-CM

## 2018-02-10 DIAGNOSIS — E118 Type 2 diabetes mellitus with unspecified complications: Secondary | ICD-10-CM | POA: Diagnosis not present

## 2018-02-10 DIAGNOSIS — I10 Essential (primary) hypertension: Secondary | ICD-10-CM | POA: Diagnosis not present

## 2018-02-10 LAB — COMPREHENSIVE METABOLIC PANEL
ALT: 16 U/L (ref 0–35)
AST: 17 U/L (ref 0–37)
Albumin: 4.3 g/dL (ref 3.5–5.2)
Alkaline Phosphatase: 82 U/L (ref 39–117)
BUN: 11 mg/dL (ref 6–23)
CO2: 24 mEq/L (ref 19–32)
CREATININE: 0.78 mg/dL (ref 0.40–1.20)
Calcium: 9.6 mg/dL (ref 8.4–10.5)
Chloride: 105 mEq/L (ref 96–112)
GFR: 76.86 mL/min (ref >60.00)
Glucose, Bld: 195 mg/dL — ABNORMAL HIGH (ref 70–99)
Potassium: 4.2 mEq/L (ref 3.5–5.1)
Sodium: 138 mEq/L (ref 135–145)
Total Bilirubin: 0.3 mg/dL (ref 0.2–1.2)
Total Protein: 7 g/dL (ref 6.0–8.3)

## 2018-02-10 LAB — T4, FREE: Free T4: 0.99 ng/dL (ref 0.60–1.60)

## 2018-02-10 LAB — LIPID PANEL
Cholesterol: 199 mg/dL (ref 0–200)
HDL: 51.9 mg/dL (ref >39.00)
LDL Cholesterol: 112 mg/dL — ABNORMAL HIGH (ref 0–99)
NonHDL: 146.7
Total CHOL/HDL Ratio: 4
Triglycerides: 172 mg/dL — ABNORMAL HIGH (ref 0.0–149.0)
VLDL: 34.4 mg/dL (ref 0.0–40.0)

## 2018-02-10 LAB — POCT GLYCOSYLATED HEMOGLOBIN (HGB A1C)
HbA1c POC (<> result, manual entry): 9.3 % (ref 4.0–5.6)
Hemoglobin A1C: 9.3 % — AB (ref 4.0–5.6)

## 2018-02-10 LAB — TSH: TSH: 3.58 u[IU]/mL (ref 0.35–4.50)

## 2018-02-10 LAB — VITAMIN D 25 HYDROXY (VIT D DEFICIENCY, FRACTURES): VITD: 27.16 ng/mL — ABNORMAL LOW (ref 30.00–100.00)

## 2018-02-10 MED ORDER — METFORMIN HCL 500 MG PO TABS: 500.0000 mg | ORAL_TABLET | Freq: Two times a day (BID) | ORAL | 3 refills | Status: DC

## 2018-02-10 MED ORDER — BACLOFEN 5 MG PO TABS: 5.0000 mg | ORAL_TABLET | Freq: Every day | ORAL | 1 refills | Status: DC

## 2018-02-10 MED ORDER — GLIPIZIDE 10 MG PO TABS: 10.0000 mg | ORAL_TABLET | Freq: Every day | ORAL | 3 refills | Status: DC

## 2018-02-10 NOTE — Patient Instructions (Addendum)
Great to see you. I will call you with your lab results from today and you can view them online.   STOP taking lantus. Start taking Metformin 500 mg daily with breakfast only and if tolerated okay, increase to 500 mg twice daily with food. Continue Glucotrol but please only take it once daily- 10 mg with breakfast. Please keep a close check on your blood sugars.   We are adding Baclofen 5 mg nightly for spasm.

## 2018-02-10 NOTE — Assessment & Plan Note (Signed)
Deteriorated. Discuss low dose baclofen and how it could further sedation along with ambien, other rxs she takes at bedtime.  She will be cautious and not take her Lorrin Mais if she has taken baclofen. eRx sent for baclofen 5 mg qhs. Call or return to clinic prn if these symptoms worsen or fail to improve as anticipated. The patient indicates understanding of these issues and agrees with the plan.

## 2018-02-10 NOTE — Assessment & Plan Note (Signed)
>  40 minutes spent in face to face time with patient, >50% spent in counselling or coordination of care discussing diabetes, chronic pain, hypothyroidism, hypertension.  Deteriorated.  a1c is up to 9.3 today.  She feels she cannot safely give herself insulin anymore.  D/c lantus.  Unclear why she has never been on Metformin.  Will decrease glucotrol 10 mg daily with breakfast, add Metformin 500 mg twice daily.  She will check FSBS twice daily and update me in 2 weeks, sooner if she has any issues. The patient indicates understanding of these issues and agrees with the plan.

## 2018-02-10 NOTE — Assessment & Plan Note (Signed)
No changes made to  Her current dose of synthroid. Check labs today. Orders Placed This Encounter  Procedures  . Lipid Profile  . Comp Met (CMET)  . TSH  . T4, free  . T3  . VITAMIN D 25 Hydroxy (Vit-D Deficiency, Fractures)  . POCT HgB A1C

## 2018-02-10 NOTE — Telephone Encounter (Signed)
Copied from [place] [id number]. Topic: General - Inquiry >> Feb 10, 2018 11:34 AM Judyann Munson wrote: Reason for CRM: Pharmacy is calling to confirm the dose change for the medication glipiZIDE (GLUCOTROL) 10 MG tablet, please advise

## 2018-02-11 LAB — T3: T3, Total: 90 ng/dL (ref 76–181)

## 2018-02-11 NOTE — Telephone Encounter (Signed)
Copied from [place] [phone]. Topic: Quick Communication - Rx Refill/Question >> Feb 11, 2018  9:50 AM Scherrie Gerlach wrote: Medication: glipiZIDE (GLUCOTROL) 10 MG tablet  Pharmacy wants to clarify this med was decreased to one a day, as pt used to take BID. please call back to confirm this change. [place], [place] (Phone) [phone] (Fax)

## 2018-02-12 NOTE — Telephone Encounter (Signed)
I did decrease it to ONCE daily.  She should only be taking Glucotrol 10 mg once daily with breakfast.

## 2018-02-12 NOTE — Telephone Encounter (Signed)
Please advise if patient should be taking medication once a day or twice a day. I will contact pharmacy. Thanks!

## 2018-02-12 NOTE — Telephone Encounter (Signed)
Pharmacy is aware

## 2018-02-13 ENCOUNTER — Telehealth: Payer: Self-pay | Admitting: Family Medicine

## 2018-02-13 NOTE — Telephone Encounter (Signed)
Copied from [place] [id number]. Topic: Quick Communication - See Telephone Encounter >> Feb 13, 2018  9:20 AM Conception Chancy, NT wrote: CRM for notification. See Telephone encounter for: 02/13/18.  Patient is calling and states she was put on Baclofen 5 MG TABS and she does want to take this one. She states she was on Bentyl 20mg  3x a day in the past and  would like to know if this could be switched.   [place], [place] [address] [place] [address] Phone: [phone] Fax: [phone]

## 2018-02-14 MED ORDER — DICYCLOMINE HCL 10 MG PO CAPS: 10.0000 mg | ORAL_CAPSULE | Freq: Three times a day (TID) | ORAL | 0 refills | Status: DC

## 2018-02-14 NOTE — Telephone Encounter (Signed)
Dr. Deborra Medina please advise and I will follow . Thanks

## 2018-02-14 NOTE — Addendum Note (Signed)
Addended by: Lucille Passy on: 02/14/2018 04:44 PM   Modules accepted: Orders

## 2018-02-14 NOTE — Telephone Encounter (Signed)
Yes I have sent rx to her pharmacy.  Bentyl is an antispasmodic for her gut.  eRx sent and med list updated.

## 2018-02-17 ENCOUNTER — Ambulatory Visit: Payer: Self-pay

## 2018-02-17 ENCOUNTER — Other Ambulatory Visit: Payer: Self-pay | Admitting: Family Medicine

## 2018-02-17 LAB — VITAMIN D 25 HYDROXY (VIT D DEFICIENCY, FRACTURES): Vit D, 25-Hydroxy: 20 ng/mL — ABNORMAL LOW (ref 30–100)

## 2018-02-17 LAB — LIPID PANEL
Cholesterol: 196 mg/dL (ref <200)
HDL: 54 mg/dL
LDL Cholesterol (Calc): 113 mg/dL (calc) — ABNORMAL HIGH
Non-HDL Cholesterol (Calc): 142 mg/dL (calc) — ABNORMAL HIGH (ref <130)
Total CHOL/HDL Ratio: 3.6 (calc) (ref <5.0)
Triglycerides: 169 mg/dL — ABNORMAL HIGH (ref <150)

## 2018-02-17 LAB — COMPLETE METABOLIC PANEL WITH GFR
AG Ratio: 1.5 (calc) (ref 1.0–2.5)
ALBUMIN MSPROF: 4.2 g/dL (ref 3.6–5.1)
ALT: 16 U/L
AST: 17 U/L (ref 10–35)
Alkaline phosphatase (APISO): 88 U/L (ref 33–130)
BUN: 10 mg/dL (ref 7–25)
CO2: 22 mmol/L (ref 20–32)
Calcium: 9.4 mg/dL
Chloride: 105 mmol/L (ref 98–110)
Creat: 0.83 mg/dL
GLUCOSE: 186 mg/dL — AB (ref 65–99)
Globulin: 2.8 g/dL (calc) (ref 1.9–3.7)
Potassium: 4.2 mmol/L (ref 3.5–5.3)
SODIUM: 138 mmol/L (ref 135–146)
TOTAL PROTEIN: 7 g/dL (ref 6.1–8.1)
Total Bilirubin: 0.3 mg/dL (ref 0.2–1.2)

## 2018-02-17 LAB — T4, FREE: Free T4: 1.3 ng/dL (ref 0.8–1.8)

## 2018-02-17 LAB — TSH: TSH: 3.74 mIU/L (ref 0.40–4.50)

## 2018-02-17 MED ORDER — DICLOFENAC SODIUM 1 % TD GEL: 2.0000 g | Freq: Four times a day (QID) | TRANSDERMAL | 3 refills | Status: DC

## 2018-02-17 NOTE — Telephone Encounter (Signed)
Called pt to offer an appt to see Mariah Reed, but pt stated she going to buy otc cream to see if that will work and pt will call back to update [place] next week. FYI

## 2018-02-17 NOTE — Telephone Encounter (Signed)
[place] please advise, Dr. Deborra Medina is out of the office this week.

## 2018-02-17 NOTE — Telephone Encounter (Signed)
Pt is aware of changed medication

## 2018-02-17 NOTE — Telephone Encounter (Signed)
Returned call to patient who states that see was in for a visit with Dr Deborra Medina last week and had rt rib pain up under her arm. She states the pain has moved into her shoulder and to her elbow.  She states that she need to use the arm to get up and down and the pain can be severe when she does.  She give Hx of left arm damage which means she can't use it.  She denies chest pain, SOB.  She insist this is bursitis and is requesting a bursitis cream called to her pharmacy.  She has refused an appointment. Care advice read to patient. Pt insists she does not need appointment.  Reason for Disposition . [1] Unable to use arm at all AND [2] because of shoulder pain or stiffness    Using arm but causes extreme pain  Answer Assessment - Initial Assessment Questions 1. ONSET: "When did the pain start?"     1week ago 2. LOCATION: "Where is the pain located?"     Under arm rt side goes into shoulder and down to the elbow 3. PAIN: "How bad is the pain?" (Scale 1-10; or mild, moderate, severe)   - MILD (1-3): doesn't interfere with normal activities   - MODERATE (4-7): interferes with normal activities (e.g., work or school) or awakens from sleep   - SEVERE (8-10): excruciating pain, unable to do any normal activities, unable to move arm at all due to pain     10 4. WORK OR EXERCISE: "Has there been any recent work or exercise that involved this part of the body?"     no 5. CAUSE: "What do you think is causing the shoulder pain?"     bursitis 6. OTHER SYMPTOMS: "Do you have any other symptoms?" (e.g., neck pain, swelling, rash, fever, numbness, weakness)   Swollen some 7. PREGNANCY: "Is there any chance you are pregnant?" "When was your last menstrual period?"     N/A  Protocols used: SHOULDER PAIN-A-AH

## 2018-02-17 NOTE — Telephone Encounter (Signed)
I am unable to make any recommendations without a physical exam.  Please make an appt

## 2018-02-21 ENCOUNTER — Other Ambulatory Visit: Payer: Self-pay | Admitting: Family Medicine

## 2018-02-27 ENCOUNTER — Other Ambulatory Visit: Payer: Self-pay

## 2018-02-27 NOTE — Progress Notes (Signed)
open in error

## 2018-03-11 ENCOUNTER — Other Ambulatory Visit: Payer: Self-pay | Admitting: Family Medicine

## 2018-03-24 ENCOUNTER — Other Ambulatory Visit: Payer: Self-pay | Admitting: Family Medicine

## 2018-03-25 NOTE — Telephone Encounter (Signed)
Requesting: Lyrica, Ambien Contract: Yes UDS: UTD Last OV: 1.6.20 Next OV: 4.1.20 Last Refill: 1.17.20 [place] PMP: Reviewed no red flags pt compliant   Please advise/thx dmf

## 2018-04-07 ENCOUNTER — Other Ambulatory Visit: Payer: Self-pay | Admitting: Family Medicine

## 2018-04-08 NOTE — Telephone Encounter (Signed)
TA-Per [place] PMP pt is compliant no red flags/NOV 4.1.20/plz advise/thx dmf

## 2018-04-15 ENCOUNTER — Encounter: Payer: Self-pay | Admitting: Family Medicine

## 2018-04-21 ENCOUNTER — Other Ambulatory Visit: Payer: Self-pay | Admitting: Family Medicine

## 2018-04-23 ENCOUNTER — Other Ambulatory Visit: Payer: Self-pay

## 2018-04-23 MED ORDER — LOVASTATIN 40 MG PO TABS: 40.0000 mg | ORAL_TABLET | Freq: Every day | ORAL | 3 refills | Status: DC

## 2018-04-24 ENCOUNTER — Other Ambulatory Visit: Payer: Self-pay | Admitting: Family Medicine

## 2018-05-06 ENCOUNTER — Other Ambulatory Visit: Payer: Self-pay | Admitting: Family Medicine

## 2018-05-06 NOTE — Telephone Encounter (Signed)
TA-Plz see refill req/LOV 1.6.20/NOV: 4.7.20/UDS & CSC UTD/per [place] PMP pt is compliant/thx dmf

## 2018-05-07 ENCOUNTER — Ambulatory Visit: Payer: Medicare Other | Admitting: Family Medicine

## 2018-05-13 ENCOUNTER — Ambulatory Visit: Payer: Medicare Other | Admitting: Family Medicine

## 2018-05-28 ENCOUNTER — Telehealth: Payer: Self-pay

## 2018-05-28 NOTE — Telephone Encounter (Signed)
PA forms filled out and faxed  To [facility] for Spiriva/thx dmf

## 2018-06-03 NOTE — Telephone Encounter (Signed)
PA for Spiriva handinhaler 18 mcg cap denied from [employer]. They will cover from Anoro Ellipta, Breo Ellipta and Incruse Ellipta. Dr. Deborra Medina please advise.

## 2018-06-04 ENCOUNTER — Telehealth: Payer: Self-pay | Admitting: Family Medicine

## 2018-06-04 NOTE — Telephone Encounter (Signed)
Copied from [place] [id number]. Topic: Quick Communication - Rx Refill/Question >> Jun 04, 2018  4:48 PM Lionel December wrote: Medication: tiotropium (SPIRIVA HANDIHALER) 18 MCG inhalation capsule  Has the patient contacted their pharmacy? Yes.   (Agent: If no, request that the patient contact the pharmacy for the refill.) (Agent: If yes, when and what did the pharmacy advise?)  Preferred Pharmacy (with phone number or street name): [facility], [place] [phone] (Phone) [phone] (Fax)   PT STATED THIS [place].   Agent: Please be advised that RX refills may take up to 3 business days. We ask that you follow-up with your pharmacy.

## 2018-06-04 NOTE — Telephone Encounter (Signed)
Please call pt to inform her and ask if she has a preference.

## 2018-06-04 NOTE — Telephone Encounter (Signed)
Spoke with the pt and she stated that Spiriva is easiest for her to use do to her hand problems "crippled hands". Spoke with the pharmacy Spiriva without insurance is about $500.   Michelle--can we appeal this PA or Dr. Deborra Medina please advise.

## 2018-06-05 MED ORDER — TIOTROPIUM BROMIDE MONOHYDRATE 18 MCG IN CAPS: ORAL_CAPSULE | RESPIRATORY_TRACT | 11 refills | Status: DC

## 2018-06-05 NOTE — Telephone Encounter (Signed)
Sharyn Lull, can you help lwith MS. Mccorvey PA?

## 2018-06-06 NOTE — Telephone Encounter (Signed)
Faxed appeal to ins/thx dmf

## 2018-06-06 NOTE — Telephone Encounter (Signed)
Completed and faxed appeal to ins/thx dmf

## 2018-06-09 ENCOUNTER — Telehealth: Payer: Self-pay

## 2018-06-09 NOTE — Telephone Encounter (Signed)
Copied from [place] [id number]. Topic: General - Other >> Jun 09, 2018 11:18 AM Jodie Echevaria wrote: Reason for CRM: Adrienne with [facility] in reference to the tiotropium (SPIRIVA HANDIHALER) 18 MCG inhalation capsule. Need to know if patient have ever taken Anuro Elipta, Incruse Elipta or Breo if not why or why not please call Ph# [phone]  Fax # [phone]

## 2018-06-09 NOTE — Telephone Encounter (Signed)
I refaxed the paperwork/thx dmf

## 2018-06-17 NOTE — Telephone Encounter (Signed)
Received mailed notification from [facility] that the addendum hfor Spiriva has been approved  From 4.22.20 until further notice/thx dmf

## 2018-07-01 ENCOUNTER — Other Ambulatory Visit: Payer: Self-pay | Admitting: Family Medicine

## 2018-07-16 ENCOUNTER — Other Ambulatory Visit: Payer: Self-pay | Admitting: Family Medicine

## 2018-07-30 ENCOUNTER — Other Ambulatory Visit: Payer: Self-pay | Admitting: Family Medicine

## 2018-08-01 ENCOUNTER — Telehealth: Payer: Self-pay | Admitting: Family Medicine

## 2018-08-01 NOTE — Telephone Encounter (Signed)
Medication: OXYCONTIN 20 MG 12 hr tablet [[id number]]   Has the patient contacted their pharmacyYes  (Agent: If no, request that the patient contact the pharmacy for the refill.) (Agent: If yes, when and what did the pharmacy advise?)  Preferred Pharmacy (with phone number or street name):    [facility], [place] [phone] (Phone) [phone] (Fax)      Agent: Please be advised that RX refills may take up to 3 business days. We ask that you follow-up with your pharmacy.

## 2018-08-04 ENCOUNTER — Telehealth: Payer: Self-pay | Admitting: Family Medicine

## 2018-08-04 ENCOUNTER — Telehealth: Payer: Self-pay

## 2018-08-04 NOTE — Telephone Encounter (Signed)
Copied from [place] [id number]. Topic: General - Other >> Aug 04, 2018 12:07 PM Rainey Pines A wrote: Patient would like a callback from nurse in regards to her pain medication that she requested 07/30/2018

## 2018-08-04 NOTE — Telephone Encounter (Signed)
TA-LF: 5.27.20/NOV: 7.20.20/per [place] PMP pt is compliant without red flags/thx dmf

## 2018-08-08 ENCOUNTER — Telehealth: Payer: Self-pay | Admitting: Family Medicine

## 2018-08-08 NOTE — Telephone Encounter (Signed)
Patient is calling about her 08/25/2018 appt. Requesting if that can be a telephone appt. Due to COVID. If needed a OV she would need to schedule transportation. Please advise. [phone]

## 2018-08-12 NOTE — Telephone Encounter (Signed)
Dr. Deborra Medina please advise. Pt is coming in for f/u on DM pt wants to know if this can be a telephone appt or if she needed to come in for this appointment.

## 2018-08-12 NOTE — Telephone Encounter (Signed)
Yes given her complicated medical history, let's do labs and then have a televisit with her after that.  Does that sound good?

## 2018-08-13 NOTE — Telephone Encounter (Signed)
Yes of course

## 2018-08-13 NOTE — Telephone Encounter (Signed)
I spoke with pt. Pt doesn't want to come in for because she has to get on the bus to get here. Pt states that she has been keeping close tabs on her BS and that it has really improved. Pt would like to just do a televisit and put labs back if possible as well because of transportation.

## 2018-08-14 NOTE — Telephone Encounter (Signed)
Pt aware that she can do televisit.

## 2018-08-19 ENCOUNTER — Other Ambulatory Visit: Payer: Self-pay | Admitting: Family Medicine

## 2018-08-25 ENCOUNTER — Ambulatory Visit: Payer: Medicare Other | Admitting: Family Medicine

## 2018-08-26 ENCOUNTER — Ambulatory Visit (INDEPENDENT_AMBULATORY_CARE_PROVIDER_SITE_OTHER): Payer: Medicare Other | Admitting: Family Medicine

## 2018-08-26 ENCOUNTER — Other Ambulatory Visit: Payer: Self-pay | Admitting: Family Medicine

## 2018-08-26 ENCOUNTER — Encounter: Payer: Self-pay | Admitting: Family Medicine

## 2018-08-26 ENCOUNTER — Other Ambulatory Visit: Payer: Self-pay

## 2018-08-26 VITALS — BP 123/70 | HR 73

## 2018-08-26 DIAGNOSIS — E118 Type 2 diabetes mellitus with unspecified complications: Secondary | ICD-10-CM

## 2018-08-26 DIAGNOSIS — F1110 Opioid abuse, uncomplicated: Secondary | ICD-10-CM | POA: Diagnosis not present

## 2018-08-26 DIAGNOSIS — G8929 Other chronic pain: Secondary | ICD-10-CM | POA: Diagnosis not present

## 2018-08-26 MED ORDER — OXYCODONE HCL ER 20 MG PO T12A: 20.0000 mg | EXTENDED_RELEASE_TABLET | Freq: Two times a day (BID) | ORAL | 0 refills | Status: DC

## 2018-08-26 MED ORDER — METFORMIN HCL 500 MG PO TABS: 500.0000 mg | ORAL_TABLET | Freq: Two times a day (BID) | ORAL | 3 refills | Status: DC

## 2018-08-26 MED ORDER — OXYCONTIN 20 MG PO T12A: 20.0000 mg | EXTENDED_RELEASE_TABLET | Freq: Two times a day (BID) | ORAL | 0 refills | Status: DC

## 2018-08-26 MED ORDER — LOVASTATIN 40 MG PO TABS: 40.0000 mg | ORAL_TABLET | Freq: Every day | ORAL | 3 refills | Status: DC

## 2018-08-26 NOTE — Assessment & Plan Note (Signed)
Chronic pain-  Per [place] PMP pt is compliant without red flags. CSC & UDS due in October. Medication and dose:oxycontin 20 mg every 12 hours # pills per month:60 Last UDS date:11/05/17 Pain contract signed (Y/N):y Date narcotic database last reviewed (include red flags):08/26/18  eRx refills sent.

## 2018-08-26 NOTE — Progress Notes (Signed)
TELEPHONE ENCOUNTER   Patient verbally agreed to telephone visit and is aware that copayment and coinsurance may apply. Patient was treated using telemedicine according to accepted telemedicine protocols.  Location of the patient: home  Location of provider: provider's home Names of all persons participating in the telemedicine service and role in the encounter: Arnette Norris, MD Micah Noel  Subjective:   Chief Complaint  Patient presents with  . Follow-up    Pt agrees to virtual visit. Pt is needing to F/U with meds and DM. In January the A1C was 9.3 and Metformin 500mg  bid and glucotrol bid and feels a lot better. Per [place] PMP pt is compliant without red flags. CSC & UDS due in October.     HPI   Dm- . In January the A1C was 9.3 and Metformin 500mg  bid and glucotrol bid and feels a lot better.  She does not want to come in to office during covid to get labs checked but has been monitoring sugars at home and ranging in 150s.  Denies any hypoglycemia. Lab Results  Component Value Date   HGBA1C 9.3 (A) 02/10/2018   HGBA1C 9.3 02/10/2018   Lab Results  Component Value Date   CHOL 199 02/10/2018   CHOL 196 02/10/2018   HDL 51.90 02/10/2018   HDL 54 02/10/2018   LDLCALC 112 (H) 02/10/2018   LDLCALC 113 (H) 02/10/2018   LDLDIRECT 106.0 03/09/2015   TRIG 172.0 (H) 02/10/2018   TRIG 169 (H) 02/10/2018   CHOLHDL 4 02/10/2018   CHOLHDL 3.6 02/10/2018    Chronic pain-  Per [place] PMP pt is compliant without red flags. CSC & UDS due in October. Medication and dose:oxycontin 20 mg every 12 hours # pills per month:60 Last UDS date:11/05/17 Pain contract signed (Y/N):y Date narcotic database last reviewed (include red flags):08/26/18  Patient Active Problem List   Diagnosis Date Noted  . Encounter for chronic pain management 05/07/2017  . Back pain 04/27/2017  . Opioid abuse, daily use ([place]) 10/03/2016  . Post-traumatic osteoarthritis 07/27/2016  . OA (osteoarthritis)  07/11/2015  . Cervical disc disorder with radiculopathy of cervical region 11/09/2013  . Headache(784.0) 10/29/2012  . Family conflict 66/59/9357  . Chronic headaches 04/03/2012  . Hypothyroidism 09/18/2010  . Vitamin D deficiency 06/14/2010  . ECHOCARDIOGRAM, ABNORMAL 03/16/2010  . MYOCARDIAL INFARCTION, HX OF 02/09/2010  . LEFT BUNDLE BRANCH BLOCK 12/26/2009  . GERD 12/26/2009  . COPD (chronic obstructive pulmonary disease) ([place]) 11/10/2009  . Diabetes mellitus type 2 with complications ([place]) 01/77/9390  . HLD (hyperlipidemia) 03/12/2006  . GOUT 03/12/2006  . Hereditary and idiopathic peripheral neuropathy 03/12/2006  . Essential hypertension 03/12/2006  . ALLERGIC RHINITIS 03/12/2006   Social History   Tobacco Use  . Smoking status: Never Smoker  . Smokeless tobacco: Never Used  Substance Use Topics  . Alcohol use: No    Current Outpatient Medications:  .  ACCU-CHEK FASTCLIX LANCETS MISC, 1 Device by Does not apply route 3 (three) times daily. Use to check blood sugar up to three times a day, Disp: 102 each, Rfl: 12 .  albuterol (PROVENTIL HFA;VENTOLIN HFA) 108 (90 Base) MCG/ACT inhaler, INHALE 2 PUFFS INTO THE LUNGS EVERY 4 HOURS AS NEEDED, Disp: 18 g, Rfl: PRN .  diclofenac sodium (VOLTAREN) 1 % GEL, APPLY 2 GRAMS TOPICALLY 4 TIMES DAILY, Disp: 100 g, Rfl: PRN .  dicyclomine (BENTYL) 10 MG capsule, Take 1 capsule (10 mg total) by mouth 4 (four) times daily -  before meals and  at bedtime., Disp: 180 capsule, Rfl: 0 .  esomeprazole (NEXIUM) 40 MG capsule, TAKE 1 CAPSULE BY MOUTH ONCE DAILY, Disp: 90 capsule, Rfl: 3 .  fexofenadine (ALLEGRA) 180 MG tablet, Take 180 mg by mouth as needed.  , Disp: , Rfl:  .  FLECTOR 1.3 % PTCH, APPLY 1 PATCH TO THE SKIN AS DIRECTED UPTO 2 PER DAY, Disp: 60 patch, Rfl: 5 .  fluticasone (FLONASE) 50 MCG/ACT nasal spray, PLACE 2 SPRAYS INTO BOTH NOSTRILS DAILY, Disp: 16 g, Rfl: 11 .  glipiZIDE (GLUCOTROL) 10 MG tablet, TAKE 1 TABLET BY MOUTH ONCE A  DAY BEFOREBREAKFAST, Disp: 60 tablet, Rfl: 3 .  hydrALAZINE (APRESOLINE) 10 MG tablet, TAKE 1 TABLET BY MOUTH 3 TIMES DAILY FORBLOOD PRESSURE, Disp: 90 tablet, Rfl: 5 .  hydrOXYzine (ATARAX/VISTARIL) 50 MG tablet, Take 1 tablet (50 mg total) by mouth 3 (three) times daily as needed., Disp: 90 tablet, Rfl: 3 .  levothyroxine (SYNTHROID, LEVOTHROID) 75 MCG tablet, TAKE 1 TABLET BY MOUTH ONCE DAILY, Disp: 30 tablet, Rfl: 5 .  lovastatin (MEVACOR) 40 MG tablet, Take 1 tablet (40 mg total) by mouth at bedtime., Disp: 90 tablet, Rfl: 3 .  metFORMIN (GLUCOPHAGE) 500 MG tablet, Take 1 tablet (500 mg total) by mouth 2 (two) times daily with a meal., Disp: 180 tablet, Rfl: 3 .  metoprolol tartrate (LOPRESSOR) 50 MG tablet, TAKE 1 TABLET BY MOUTH TWICE (2) DAILY AS NEEDED FOR INCREASED HEART RATE, Disp: 60 tablet, Rfl: 5 .  nitroGLYCERIN (NITROSTAT) 0.4 MG SL tablet, DISSOLVE 1 TABLET UNDER TONGUE AS NEEDEDFOR CHEST PAIN. MAY REPEAT 5 MINUTES APART 3 TIMES IF NEEDED, Disp: 25 tablet, Rfl: 5 .  nystatin (MYCOSTATIN) 100000 UNIT/ML suspension, TAKE 1 TEASPOONFUL BY MOUTH 4 TIMES DAILY AS DIRECTED, Disp: 60 mL, Rfl: 6 .  nystatin cream (MYCOSTATIN), APPLY TO AFFECTED AREAS ONCE DAILY AS NEEDED, Disp: 30 g, Rfl: PRN .  oxyCODONE (OXYCONTIN) 20 mg 12 hr tablet, Take 1 tablet (20 mg total) by mouth every 12 (twelve) hours., Disp: 60 tablet, Rfl: 0 .  OXYCONTIN 20 MG 12 hr tablet, Take 1 tablet (20 mg total) by mouth every 12 (twelve) hours., Disp: 60 tablet, Rfl: 0 .  PATADAY 0.2 % SOLN, PLACE 1 DROP INTO BOTH EYES EVERY DAY, Disp: 2.5 mL, Rfl: 2 .  pregabalin (LYRICA) 50 MG capsule, TAKE 1 CAPSULE BY MOUTH TWICE DAILY, Disp: 60 capsule, Rfl: 5 .  tiotropium (SPIRIVA HANDIHALER) 18 MCG inhalation capsule, INHALE THE CONTENTS OF 1 CAPSULE VIA HANDIHALER BY MOUTH ONCE DAILY, Disp: 30 capsule, Rfl: 11 .  topiramate (TOPAMAX) 100 MG tablet, TAKE 3 TABLETS BY MOUTH ONCE DAILY, Disp: 90 tablet, Rfl: 3 .  zolpidem (AMBIEN)  10 MG tablet, TAKE 1 TABLET BY MOUTH EVERY NIGHT AT BEDTIME, Disp: 30 tablet, Rfl: 5 .  oxyCODONE (OXYCONTIN) 20 mg 12 hr tablet, Take 1 tablet (20 mg total) by mouth every 12 (twelve) hours., Disp: 60 tablet, Rfl: 0 Allergies  Allergen Reactions  . Levofloxacin Hives    Assessment & Plan:   No diagnosis found.  No orders of the defined types were placed in this encounter.  Meds ordered this encounter  Medications  . oxyCODONE (OXYCONTIN) 20 mg 12 hr tablet    Sig: Take 1 tablet (20 mg total) by mouth every 12 (twelve) hours.    Dispense:  60 tablet    Refill:  0    August  . OXYCONTIN 20 MG 12 hr tablet    Sig:  Take 1 tablet (20 mg total) by mouth every 12 (twelve) hours.    Dispense:  60 tablet    Refill:  0    July  . oxyCODONE (OXYCONTIN) 20 mg 12 hr tablet    Sig: Take 1 tablet (20 mg total) by mouth every 12 (twelve) hours.    Dispense:  60 tablet    Refill:  0    September    Liela Rylee, MD 08/26/2018  Time spent with the patient: 22 minutes, spent in obtaining information about her symptoms, reviewing her previous labs, evaluations, and treatments, counseling her about her condition (please see the discussed topics above), and developing a plan to further investigate it; she had a number of questions which I addressed.   [id number] physician/qualified health professional telephone evaluation 5 to 10 minutes [id number] physician/qualified help functional [place] evaluation for 11 to 20 minutes [id number] physician/qualify he will professional telephone evaluation for 21 to 30 minutes

## 2018-08-26 NOTE — Assessment & Plan Note (Signed)
She understandably is frightened to leave her home and her home readings are better.  She will schedule an in person office visit with me in 203 months. The patient indicates understanding of these issues and agrees with the plan.

## 2018-09-16 ENCOUNTER — Other Ambulatory Visit: Payer: Self-pay | Admitting: Family Medicine

## 2018-09-30 ENCOUNTER — Telehealth: Payer: Self-pay | Admitting: Family Medicine

## 2018-09-30 NOTE — Telephone Encounter (Signed)
Patient called and did not want schedule a virtual appointment and declined appointment. Patient is requesting Dr. Deborra Medina to send in an antibiotic for possible sinus infection.

## 2018-09-30 NOTE — Telephone Encounter (Signed)
I am sorry but I cannot do that.  I would be happy for her to schedule a virtual appointment with me on Thursday.

## 2018-09-30 NOTE — Telephone Encounter (Signed)
Dr. Deborra Medina - Please advise. Thanks.

## 2018-10-01 DIAGNOSIS — J329 Chronic sinusitis, unspecified: Secondary | ICD-10-CM | POA: Insufficient documentation

## 2018-10-01 NOTE — Progress Notes (Signed)
TELEPHONE ENCOUNTER   Patient verbally agreed to telephone visit and is aware that copayment and coinsurance may apply. Patient was treated using telemedicine according to accepted telemedicine protocols.  Location of the patient: patient's home Location of provider: provider's home Names of all persons participating in the telemedicine service and role in the encounter: Arnette Norris, MD Micah Noel  Subjective:   Chief Complaint  Patient presents with  . Nasal Congestion    pt is c/o of headache,congestion, stop up--/going on 5 days/took mucinex and allegra D     HPI    Received the following message two days ago:    Patient called and did not want schedule a virtual appointment and declined appointment. Patient is requesting Dr. Deborra Medina to send in an antibiotic for possible sinus infection.       Explained to pt that unfortunately we can't do that but I would be happy to do a virtual visit.  She has had frontal and maxillary sinus pressure, congestion for 5 days.  No fevers or myalgias.  Has been taking mucinex and allegra D without much improvement of symptoms.  No lost of taste or smell.  Has not been around anyone with covid.  Has not left the house in months.   Patient Active Problem List   Diagnosis Date Noted  . Pansinusitis 10/02/2018  . Chronic sinusitis 10/01/2018  . Encounter for chronic pain management 05/07/2017  . Back pain 04/27/2017  . Opioid abuse, daily use ([place]) 10/03/2016  . Post-traumatic osteoarthritis 07/27/2016  . OA (osteoarthritis) 07/11/2015  . Cervical disc disorder with radiculopathy of cervical region 11/09/2013  . Headache(784.0) 10/29/2012  . Family conflict [id number]  . Chronic headaches 04/03/2012  . Hypothyroidism 09/18/2010  . Vitamin D deficiency 06/14/2010  . ECHOCARDIOGRAM, ABNORMAL 03/16/2010  . MYOCARDIAL INFARCTION, HX OF 02/09/2010  . LEFT BUNDLE BRANCH BLOCK 12/26/2009  . GERD 12/26/2009  . COPD (chronic obstructive  pulmonary disease) ([place]) 11/10/2009  . Diabetes mellitus type 2 with complications ([place]) [id number]  . HLD (hyperlipidemia) 03/12/2006  . GOUT 03/12/2006  . Hereditary and idiopathic peripheral neuropathy 03/12/2006  . Essential hypertension 03/12/2006  . ALLERGIC RHINITIS 03/12/2006   Social History   Tobacco Use  . Smoking status: Never Smoker  . Smokeless tobacco: Never Used  Substance Use Topics  . Alcohol use: No    Current Outpatient Medications:  .  ACCU-CHEK FASTCLIX LANCETS MISC, 1 Device by Does not apply route 3 (three) times daily. Use to check blood sugar up to three times a day, Disp: 102 each, Rfl: 12 .  albuterol (PROVENTIL HFA;VENTOLIN HFA) 108 (90 Base) MCG/ACT inhaler, INHALE 2 PUFFS INTO THE LUNGS EVERY 4 HOURS AS NEEDED, Disp: 18 g, Rfl: PRN .  Cyanocobalamin (B-12 PO), Take by mouth., Disp: , Rfl:  .  diclofenac sodium (VOLTAREN) 1 % GEL, APPLY 2 GRAMS TOPICALLY 4 TIMES DAILY, Disp: 100 g, Rfl: PRN .  dicyclomine (BENTYL) 10 MG capsule, Take 1 capsule (10 mg total) by mouth 4 (four) times daily -  before meals and at bedtime., Disp: 180 capsule, Rfl: 0 .  esomeprazole (NEXIUM) 40 MG capsule, TAKE 1 CAPSULE BY MOUTH ONCE DAILY, Disp: 90 capsule, Rfl: 3 .  fexofenadine (ALLEGRA) 180 MG tablet, Take 180 mg by mouth as needed.  , Disp: , Rfl:  .  FLECTOR 1.3 % PTCH, APPLY 1 PATCH TO THE SKIN AS DIRECTED UPTO 2 PER DAY, Disp: 60 patch, Rfl: 5 .  fluticasone (FLONASE) 50 MCG/ACT nasal  spray, PLACE 2 SPRAYS INTO BOTH NOSTRILS DAILY, Disp: 16 g, Rfl: 11 .  glipiZIDE (GLUCOTROL) 10 MG tablet, TAKE 1 TABLET BY MOUTH ONCE A DAY BEFOREBREAKFAST, Disp: 60 tablet, Rfl: 3 .  hydrALAZINE (APRESOLINE) 10 MG tablet, TAKE 1 TABLET BY MOUTH 3 TIMES DAILY FORBLOOD PRESSURE, Disp: 90 tablet, Rfl: 5 .  hydrOXYzine (ATARAX/VISTARIL) 50 MG tablet, Take 1 tablet (50 mg total) by mouth 3 (three) times daily as needed., Disp: 90 tablet, Rfl: 3 .  levothyroxine (SYNTHROID, LEVOTHROID) 75 MCG  tablet, TAKE 1 TABLET BY MOUTH ONCE DAILY, Disp: 30 tablet, Rfl: 5 .  lovastatin (MEVACOR) 40 MG tablet, Take 1 tablet (40 mg total) by mouth at bedtime., Disp: 90 tablet, Rfl: 3 .  metFORMIN (GLUCOPHAGE) 500 MG tablet, Take 1 tablet (500 mg total) by mouth 2 (two) times daily with a meal., Disp: 180 tablet, Rfl: 3 .  metoprolol tartrate (LOPRESSOR) 50 MG tablet, TAKE 1 TABLET BY MOUTH TWICE (2) DAILY AS NEEDED FOR INCREASED HEART RATE, Disp: 60 tablet, Rfl: 5 .  nitroGLYCERIN (NITROSTAT) 0.4 MG SL tablet, DISSOLVE 1 TABLET UNDER TONGUE AS NEEDEDFOR CHEST PAIN. MAY REPEAT 5 MINUTES APART 3 TIMES IF NEEDED, Disp: 25 tablet, Rfl: 5 .  nystatin cream (MYCOSTATIN), APPLY TO AFFECTED AREAS ONCE DAILY AS NEEDED, Disp: 30 g, Rfl: PRN .  OVER THE COUNTER MEDICATION, Anti acid, Disp: , Rfl:  .  oxyCODONE (OXYCONTIN) 20 mg 12 hr tablet, Take 1 tablet (20 mg total) by mouth every 12 (twelve) hours., Disp: 60 tablet, Rfl: 0 .  PATADAY 0.2 % SOLN, PLACE 1 DROP INTO BOTH EYES EVERY DAY, Disp: 2.5 mL, Rfl: 2 .  pregabalin (LYRICA) 50 MG capsule, TAKE 1 CAPSULE BY MOUTH TWICE DAILY, Disp: 60 capsule, Rfl: 5 .  tiotropium (SPIRIVA HANDIHALER) 18 MCG inhalation capsule, INHALE THE CONTENTS OF 1 CAPSULE VIA HANDIHALER BY MOUTH ONCE DAILY, Disp: 30 capsule, Rfl: 11 .  topiramate (TOPAMAX) 100 MG tablet, TAKE 3 TABLETS BY MOUTH ONCE DAILY, Disp: 90 tablet, Rfl: 3 .  VITAMIN D PO, Take 1,000 mg by mouth., Disp: , Rfl:  .  zolpidem (AMBIEN) 10 MG tablet, TAKE 1 TABLET BY MOUTH EVERY NIGHT AT BEDTIME, Disp: 30 tablet, Rfl: 5 .  amoxicillin-clavulanate (AUGMENTIN) 875-125 MG tablet, Take 1 tablet by mouth 2 (two) times daily for 10 days., Disp: 20 tablet, Rfl: 0 .  nystatin (MYCOSTATIN) 100000 UNIT/ML suspension, TAKE 1 TEASPOONFUL BY MOUTH 4 TIMES DAILY AS DIRECTED (Patient not taking: Reported on 10/02/2018), Disp: 60 mL, Rfl: 6 .  oxyCODONE (OXYCONTIN) 20 mg 12 hr tablet, Take 1 tablet (20 mg total) by mouth every 12  (twelve) hours. (Patient not taking: Reported on 10/02/2018), Disp: 60 tablet, Rfl: 0 .  OXYCONTIN 20 MG 12 hr tablet, Take 1 tablet (20 mg total) by mouth every 12 (twelve) hours. (Patient not taking: Reported on 10/02/2018), Disp: 60 tablet, Rfl: 0 Allergies  Allergen Reactions  . Levofloxacin Hives    Assessment & Plan:   1. Chronic pansinusitis   2. Acute recurrent pansinusitis     No orders of the defined types were placed in this encounter.  Meds ordered this encounter  Medications  . amoxicillin-clavulanate (AUGMENTIN) 875-125 MG tablet    Sig: Take 1 tablet by mouth 2 (two) times daily for 10 days.    Dispense:  20 tablet    Refill:  0    Arnette Norris, MD 10/02/2018  Time spent with the patient: 22 minutes, spent in  obtaining information about her symptoms, reviewing her previous labs, evaluations, and treatments, counseling her about her condition (please see the discussed topics above), and developing a plan to further investigate it; she had a number of questions which I addressed.   [id number] physician/qualified health professional telephone evaluation 5 to 10 minutes [id number] physician/qualified help functional [place] evaluation for 11 to 20 minutes [id number] physician/qualify he will professional telephone evaluation for 21 to 30 minutes

## 2018-10-01 NOTE — Telephone Encounter (Signed)
Telephone visit has been scheduled for 10/02/2018 at 10:00 AM.

## 2018-10-02 ENCOUNTER — Ambulatory Visit (INDEPENDENT_AMBULATORY_CARE_PROVIDER_SITE_OTHER): Payer: Medicare Other | Admitting: Family Medicine

## 2018-10-02 ENCOUNTER — Encounter: Payer: Self-pay | Admitting: Family Medicine

## 2018-10-02 ENCOUNTER — Other Ambulatory Visit: Payer: Self-pay

## 2018-10-02 DIAGNOSIS — J0141 Acute recurrent pansinusitis: Secondary | ICD-10-CM

## 2018-10-02 DIAGNOSIS — J324 Chronic pansinusitis: Secondary | ICD-10-CM | POA: Diagnosis not present

## 2018-10-02 MED ORDER — AMOXICILLIN-POT CLAVULANATE 875-125 MG PO TABS: 1.0000 | ORAL_TABLET | Freq: Two times a day (BID) | ORAL | 0 refills | Status: AC

## 2018-10-02 NOTE — Assessment & Plan Note (Signed)
New- talked to pt at length- only has had symptoms for 5 days but she feels it is worsening.  She is immunocompromised but has not left the house.  Symptoms due sound more consistent with a sinus infection than covid. eRx sent for augmentin twice daily x 10 days, continue nasal sprays, otc rxs, push fluids for supportive care. Call or send my chart message prn if these symptoms worsen or fail to improve as anticipated. The patient indicates understanding of these issues and agrees with the plan.

## 2018-10-09 DIAGNOSIS — Z23 Encounter for immunization: Secondary | ICD-10-CM | POA: Diagnosis not present

## 2018-10-21 ENCOUNTER — Telehealth: Payer: Self-pay

## 2018-10-21 ENCOUNTER — Other Ambulatory Visit: Payer: Self-pay | Admitting: Family Medicine

## 2018-10-21 NOTE — Telephone Encounter (Signed)
When did she last take that medication?

## 2018-10-21 NOTE — Telephone Encounter (Signed)
Spoke with pt. Pt states that she has never taken this medication before. Pt states that she spoke with you in the past about having migraines and that she was dealing with it by using 2 cups of coffee grounds followed by water and cranberry, but she thinks that the coffee is starting to affect her ulcers and would like to try something different to help with her migraines.

## 2018-10-21 NOTE — Telephone Encounter (Signed)
Dr. Deborra Medina please advise Copied from [place] [id number]. Topic: General - Other >> Oct 20, 2018  4:47 PM Mariah Reed wrote: Reason for CRM: pt has migraines and was wondering if a prescription can be sent to [facility] for Almotriptan Malate(Asert). Please call pt to advise

## 2018-10-28 ENCOUNTER — Other Ambulatory Visit: Payer: Self-pay | Admitting: Family Medicine

## 2018-10-29 NOTE — Telephone Encounter (Signed)
TA-Pt is UTD with UDS & CSC/per [place] PMP pt is compliant without red flags/thx dmf

## 2018-10-31 ENCOUNTER — Telehealth: Payer: Self-pay

## 2018-10-31 NOTE — Telephone Encounter (Signed)
Copied from [place] [id number]. Topic: General - Inquiry >> Oct 30, 2018 10:47 AM Mathis Bud wrote: Reason for CRM: Patient is requesting a new medication for her headaches.  Patient saw medication on TV "Roselyn Meier" and would like to be on this medication for her migraines  Call back [phone]

## 2018-11-03 NOTE — Telephone Encounter (Signed)
Dr. Deborra Medina please advise looks like there is a previous message from 10/21/18 that was sent to you as well about this

## 2018-11-03 NOTE — Telephone Encounter (Signed)
That medication is outside my scope of practice.  She would need to see a neurologist.  Would she like a referral?

## 2018-11-04 NOTE — Telephone Encounter (Signed)
Dr. Deborra Medina this is just a FYI, I called and spoke with pt and she states she has tried neurology in the past and felt it did not help. She wants to hold off on a referral at this time.

## 2018-12-16 ENCOUNTER — Other Ambulatory Visit: Payer: Self-pay | Admitting: Family Medicine

## 2018-12-16 MED ORDER — OXYCODONE HCL ER 20 MG PO T12A: 20.0000 mg | EXTENDED_RELEASE_TABLET | Freq: Two times a day (BID) | ORAL | 0 refills | Status: DC

## 2018-12-16 MED ORDER — OXYCONTIN 20 MG PO T12A: 20.0000 mg | EXTENDED_RELEASE_TABLET | Freq: Two times a day (BID) | ORAL | 0 refills | Status: DC

## 2018-12-16 NOTE — Telephone Encounter (Signed)
TA-Plz see refill requests/pt is UTD with CSC & UDS/Per [place] PMP pt is compliant without red flags/prepared and pended for your approval/thx dmf

## 2018-12-30 ENCOUNTER — Ambulatory Visit: Payer: Medicare Other | Admitting: Family Medicine

## 2018-12-30 ENCOUNTER — Other Ambulatory Visit: Payer: Self-pay

## 2018-12-30 ENCOUNTER — Encounter: Payer: Self-pay | Admitting: Family Medicine

## 2019-01-13 NOTE — Progress Notes (Signed)
Virtual Visit via Video Note  I connected with patient on 01/14/19 at 10:15 AM EST by audio enabled telemedicine application and verified that I am speaking with the correct person using two identifiers.   THIS ENCOUNTER IS A VIRTUAL VISIT DUE TO COVID-19 - PATIENT WAS NOT SEEN IN THE OFFICE. PATIENT HAS CONSENTED TO VIRTUAL VISIT / TELEMEDICINE VISIT   Location of patient: home  Location of provider: office  I discussed the limitations of evaluation and management by telemedicine and the availability of in person appointments. The patient expressed understanding and agreed to proceed.   Subjective:   Mariah Reed is a 73 y.o. female who presents for Medicare Annual (Subsequent) preventive examination.  Review of Systems:    Home Safety/Smoke Alarms: Feels safe in home. Smoke alarms in place.  Lives alone in 1st floor apt.    Female:    Mammo-   declines    Dexa scan-   declines     CCS- declines    Objective:     Vitals: BP 121/71 Comment: pt reported  Pulse 71    Advanced Directives 01/14/2019  Does Patient Have a Medical Advance Directive? No  Would patient like information on creating a medical advance directive? No - Patient declined    Tobacco Social History   Tobacco Use  Smoking Status Never Smoker  Smokeless Tobacco Never Used     Counseling given: Not Answered   Clinical Intake: Pain : No/denies pain     Past Medical History:  Diagnosis Date  . Allergy   . COPD (chronic obstructive pulmonary disease) ([place])   . Diabetes mellitus   . Gout   . Hyperlipidemia   . Hypertension   . Low back pain   . Peripheral neuropathy    Past Surgical History:  Procedure Laterality Date  . ABDOMINAL HYSTERECTOMY    . POLYPECTOMY     Colon  . TUBAL LIGATION     Family History  Problem Relation Age of Onset  . Uterine cancer Mother    Social History   Socioeconomic History  . Marital status: Legally Separated    Spouse name: Allayah Egerer  .  Number of children: Not on file  . Years of education: Not on file  . Highest education level: Not on file  Occupational History    Employer: DISABLED  Social Needs  . Financial resource strain: Not on file  . Food insecurity    Worry: Not on file    Inability: Not on file  . Transportation needs    Medical: Not on file    Non-medical: Not on file  Tobacco Use  . Smoking status: Never Smoker  . Smokeless tobacco: Never Used  Substance and Sexual Activity  . Alcohol use: No  . Drug use: No  . Sexual activity: Not on file  Lifestyle  . Physical activity    Days per week: Not on file    Minutes per session: Not on file  . Stress: Not on file  Relationships  . Social Herbalist on phone: Not on file    Gets together: Not on file    Attends religious service: Not on file    Active member of club or organization: Not on file    Attends meetings of clubs or organizations: Not on file    Relationship status: Not on file  Other Topics Concern  . Not on file  Social History Narrative   Pt would like [place]  Ulice Brilliant at [phone] (nephew) to be called in case of emergency and ask him to call her daughter [name] at [phone].    Outpatient Encounter Medications as of 01/14/2019  Medication Sig  . ACCU-CHEK FASTCLIX LANCETS MISC 1 Device by Does not apply route 3 (three) times daily. Use to check blood sugar up to three times a day  . albuterol (PROVENTIL HFA;VENTOLIN HFA) 108 (90 Base) MCG/ACT inhaler INHALE 2 PUFFS INTO THE LUNGS EVERY 4 HOURS AS NEEDED  . Cyanocobalamin (B-12 PO) Take by mouth.  . diclofenac sodium (VOLTAREN) 1 % GEL APPLY 2 GRAMS TOPICALLY 4 TIMES DAILY  . dicyclomine (BENTYL) 10 MG capsule Take 1 capsule (10 mg total) by mouth 4 (four) times daily -  before meals and at bedtime.  Marland Kitchen esomeprazole (NEXIUM) 40 MG capsule TAKE 1 CAPSULE BY MOUTH ONCE DAILY  . fexofenadine (ALLEGRA) 180 MG tablet Take 180 mg by mouth as needed.    Marland Kitchen FLECTOR  1.3 % PTCH APPLY 1 PATCH TO THE SKIN AS DIRECTED UPTO 2 PER DAY  . fluticasone (FLONASE) 50 MCG/ACT nasal spray PLACE 2 SPRAYS INTO BOTH NOSTRILS DAILY  . glipiZIDE (GLUCOTROL) 10 MG tablet TAKE 1 TABLET BY MOUTH ONCE A DAY BEFOREBREAKFAST  . hydrALAZINE (APRESOLINE) 10 MG tablet TAKE 1 TABLET 3 TIMES A DAY FOR BLOOD PRESSURE  . hydrOXYzine (ATARAX/VISTARIL) 50 MG tablet Take 1 tablet (50 mg total) by mouth 3 (three) times daily as needed.  Marland Kitchen levothyroxine (SYNTHROID) 75 MCG tablet TAKE 1 TABLET BY MOUTH ONCE A DAY  . lovastatin (MEVACOR) 40 MG tablet Take 1 tablet (40 mg total) by mouth at bedtime.  . metFORMIN (GLUCOPHAGE) 500 MG tablet Take 1 tablet (500 mg total) by mouth 2 (two) times daily with a meal.  . metoprolol tartrate (LOPRESSOR) 50 MG tablet TAKE 1 TABLET BY MOUTH TWICE A DAY AS NEEDED FOR INCREASED HEART RATE  . nystatin (MYCOSTATIN) 100000 UNIT/ML suspension TAKE 1 TEASPOONFUL BY MOUTH 4 TIMES DAILY AS DIRECTED  . nystatin cream (MYCOSTATIN) APPLY TO AFFECTED AREAS ONCE DAILY AS NEEDED  . OVER THE COUNTER MEDICATION Anti acid  . OXYCONTIN 20 MG 12 hr tablet TAKE 1 TABLET BY MOUTH TWICE A DAY  . PATADAY 0.2 % SOLN PLACE 1 DROP INTO BOTH EYES EVERY DAY  . pregabalin (LYRICA) 50 MG capsule TAKE 1 CAPSULE BY MOUTH TWICE DAILY  . tiotropium (SPIRIVA HANDIHALER) 18 MCG inhalation capsule INHALE THE CONTENTS OF 1 CAPSULE VIA HANDIHALER BY MOUTH ONCE DAILY  . topiramate (TOPAMAX) 100 MG tablet TAKE 3 TABLETS BY MOUTH ONCE DAILY  . VITAMIN D PO Take 1,000 mg by mouth.  . zolpidem (AMBIEN) 10 MG tablet TAKE 1 TABLET BY MOUTH DAILY AT BEDTIME  . nitroGLYCERIN (NITROSTAT) 0.4 MG SL tablet DISSOLVE 1 TABLET UNDER TONGUE AS NEEDEDFOR CHEST PAIN. MAY REPEAT 5 MINUTES APART 3 TIMES IF NEEDED (Patient not taking: Reported on 01/14/2019)   No facility-administered encounter medications on file as of 01/14/2019.     Activities of Daily Living In your present state of health, do you have any  difficulty performing the following activities: 01/14/2019 08/26/2018  Hearing? N N  Vision? N N  Difficulty concentrating or making decisions? N N  Walking or climbing stairs? N N  Dressing or bathing? N N  Doing errands, shopping? N N  Preparing Food and eating ? N -  Using the Toilet? N -  In the past six months, have you accidently leaked urine? N -  Do you have problems with loss of bowel control? N -  Managing your Medications? N -  Managing your Finances? N -  Housekeeping or managing your Housekeeping? N -  Some recent data might be hidden    Patient Care Team: Lucille Passy, MD as PCP - General    Assessment:   This is a routine wellness examination for [place]. Physical assessment deferred to PCP.  Exercise Activities and Dietary recommendations Current Exercise Habits: The patient does not participate in regular exercise at present, Exercise limited by: Other - see comments(back pain) Diet (meal preparation, eat out, water intake, caffeinated beverages, dairy products, fruits and vegetables): in general, an "unhealthy" diet   Goals    . Increase physical activity       Fall Risk Fall Risk  01/14/2019 08/26/2018 03/20/2017  Falls in the past year? 0 0 No  Follow up Education provided;Falls prevention discussed Falls evaluation completed -    Depression Screen PHQ 2/9 Scores 01/14/2019 08/26/2018 03/20/2017 09/12/2016  PHQ - 2 Score 0 0 0 0     Cognitive Function Ad8 score reviewed for issues:  Issues making decisions:no  Less interest in hobbies / activities:no  Repeats questions, stories (family complaining):no  Trouble using ordinary gadgets (microwave, computer, phone):no  Forgets the month or year: no  Mismanaging finances: no  Remembering appts:no  Daily problems with thinking and/or memory:no Ad8 score is=0         Immunization History  Administered Date(s) Administered  . Influenza Split 12/18/2010, 11/19/2011  . Influenza Whole 11/10/2009   . Influenza,inj,Quad PF,6+ Mos 10/29/2012, 11/09/2013, 11/09/2014, 11/17/2015, 10/30/2016, 11/05/2017  . Pneumococcal Conjugate-13 03/15/2014  . Pneumococcal Polysaccharide-23 11/19/2007, 05/07/2017  . Zoster 11/10/2009    Screening Tests Health Maintenance  Topic Date Due  . OPHTHALMOLOGY EXAM  09/13/1955  . TETANUS/TDAP  09/13/1963  . FOOT EXAM  11/09/2015  . HEMOGLOBIN A1C  08/11/2018  . DEXA SCAN  02/11/2019 (Originally 09/12/2009)  . MAMMOGRAM  07/06/2024 (Originally 11/11/2011)  . COLONOSCOPY  03/12/2023  . INFLUENZA VACCINE  Completed  . Hepatitis C Screening  Completed  . PNA vac Low Risk Adult  Completed      Plan:    Please schedule your next medicare wellness visit with me in 1 yr.  Continue to eat heart healthy diet (full of fruits, vegetables, whole grains, lean protein, water--limit salt, fat, and sugar intake) and increase physical activity as tolerated.  Continue doing brain stimulating activities (puzzles, reading, adult coloring books, staying active) to keep memory sharp.   Bring a copy of your living will and/or healthcare power of attorney to your next office visit.    I have personally reviewed and noted the following in the patient's chart:   . Medical and social history . Use of alcohol, tobacco or illicit drugs  . Current medications and supplements . Functional ability and status . Nutritional status . Physical activity . Advanced directives . List of other physicians . Hospitalizations, surgeries, and ER visits in previous 12 months . Vitals . Screenings to include cognitive, depression, and falls . Referrals and appointments  In addition, I have reviewed and discussed with patient certain preventive protocols, quality metrics, and best practice recommendations. A written personalized care plan for preventive services as well as general preventive health recommendations were provided to patient.     Shela Nevin, [place]  01/14/2019

## 2019-01-14 ENCOUNTER — Encounter: Payer: Self-pay | Admitting: *Deleted

## 2019-01-14 ENCOUNTER — Ambulatory Visit (INDEPENDENT_AMBULATORY_CARE_PROVIDER_SITE_OTHER): Payer: Medicare Other | Admitting: *Deleted

## 2019-01-14 VITALS — BP 121/71 | HR 71

## 2019-01-14 DIAGNOSIS — Z0000 Encounter for general adult medical examination without abnormal findings: Secondary | ICD-10-CM | POA: Diagnosis not present

## 2019-01-14 NOTE — Patient Instructions (Signed)
Please schedule your next medicare wellness visit with me in 1 yr.  Continue to eat heart healthy diet (full of fruits, vegetables, whole grains, lean protein, water--limit salt, fat, and sugar intake) and increase physical activity as tolerated.  Continue doing brain stimulating activities (puzzles, reading, adult coloring books, staying active) to keep memory sharp.   Bring a copy of your living will and/or healthcare power of attorney to your next office visit.   [name] , Thank you for taking time to come for your Medicare Wellness Visit. I appreciate your ongoing commitment to your health goals. Please review the following plan we discussed and let me know if I can assist you in the future.   These are the goals we discussed: Goals    . Increase physical activity       This is a list of the screening recommended for you and due dates:  Health Maintenance  Topic Date Due  . Eye exam for diabetics  09/13/1954  . Tetanus Vaccine  09/13/1963  . Complete foot exam   11/09/2015  . Hemoglobin A1C  08/11/2018  . DEXA scan (bone density measurement)  02/11/2019*  . Mammogram  07/06/2024*  . Colon Cancer Screening  03/12/2023  . Flu Shot  Completed  .  Hepatitis C: One time screening is recommended by Center for Disease Control  (CDC) for  adults born from [birth year] through 1965.   Completed  . Pneumonia vaccines  Completed  *Topic was postponed. The date shown is not the original due date.    Preventive Care 49 Years and Older, Female Preventive care refers to lifestyle choices and visits with your health care provider that can promote health and wellness. This includes:  A yearly physical exam. This is also called an annual well check.  Regular dental and eye exams.  Immunizations.  Screening for certain conditions.  Healthy lifestyle choices, such as diet and exercise. What can I expect for my preventive care visit? Physical exam Your health care provider will check:   Height and weight. These may be used to calculate body mass index (BMI), which is a measurement that tells if you are at a healthy weight.  Heart rate and blood pressure.  Your skin for abnormal spots. Counseling Your health care provider may ask you questions about:  Alcohol, tobacco, and drug use.  Emotional well-being.  Home and relationship well-being.  Sexual activity.  Eating habits.  History of falls.  Memory and ability to understand (cognition).  Work and work Statistician.  Pregnancy and menstrual history. What immunizations do I need?  Influenza (flu) vaccine  This is recommended every year. Tetanus, diphtheria, and pertussis (Tdap) vaccine  You may need a Td booster every 10 years. Varicella (chickenpox) vaccine  You may need this vaccine if you have not already been vaccinated. Zoster (shingles) vaccine  You may need this after age 69. Pneumococcal conjugate (PCV13) vaccine  One dose is recommended after age 9. Pneumococcal polysaccharide (PPSV23) vaccine  One dose is recommended after age 58. Measles, mumps, and rubella (MMR) vaccine  You may need at least one dose of MMR if you were born in 1957 or later. You may also need a second dose. Meningococcal conjugate (MenACWY) vaccine  You may need this if you have certain conditions. Hepatitis A vaccine  You may need this if you have certain conditions or if you travel or work in places where you may be exposed to hepatitis A. Hepatitis B vaccine  You may  need this if you have certain conditions or if you travel or work in places where you may be exposed to hepatitis B. Haemophilus influenzae type b (Hib) vaccine  You may need this if you have certain conditions. You may receive vaccines as individual doses or as more than one vaccine together in one shot (combination vaccines). Talk with your health care provider about the risks and benefits of combination vaccines. What tests do I need? Blood  tests  Lipid and cholesterol levels. These may be checked every 5 years, or more frequently depending on your overall health.  Hepatitis C test.  Hepatitis B test. Screening  Lung cancer screening. You may have this screening every year starting at age 78 if you have a 30-pack-year history of smoking and currently smoke or have quit within the past 15 years.  Colorectal cancer screening. All adults should have this screening starting at age 11 and continuing until age 40. Your health care provider may recommend screening at age 53 if you are at increased risk. You will have tests every 1-10 years, depending on your results and the type of screening test.  Diabetes screening. This is done by checking your blood sugar (glucose) after you have not eaten for a while (fasting). You may have this done every 1-3 years.  Mammogram. This may be done every 1-2 years. Talk with your health care provider about how often you should have regular mammograms.  BRCA-related cancer screening. This may be done if you have a family history of breast, ovarian, tubal, or peritoneal cancers. Other tests  Sexually transmitted disease (STD) testing.  Bone density scan. This is done to screen for osteoporosis. You may have this done starting at age 58. Follow these instructions at home: Eating and drinking  Eat a diet that includes fresh fruits and vegetables, whole grains, lean protein, and low-fat dairy products. Limit your intake of foods with high amounts of sugar, saturated fats, and salt.  Take vitamin and mineral supplements as recommended by your health care provider.  Do not drink alcohol if your health care provider tells you not to drink.  If you drink alcohol: ? Limit how much you have to 0-1 drink a day. ? Be aware of how much alcohol is in your drink. In the [place] one drink equals one 12 oz bottle of beer (355 mL), one 5 oz glass of wine (148 mL), or one 1 oz glass of hard liquor (44 mL).  Lifestyle  Take daily care of your teeth and gums.  Stay active. Exercise for at least 30 minutes on 5 or more days each week.  Do not use any products that contain nicotine or tobacco, such as cigarettes, e-cigarettes, and chewing tobacco. If you need help quitting, ask your health care provider.  If you are sexually active, practice safe sex. Use a condom or other form of protection in order to prevent STIs (sexually transmitted infections).  Talk with your health care provider about taking a low-dose aspirin or statin. What's next?  Go to your health care provider once a year for a well check visit.  Ask your health care provider how often you should have your eyes and teeth checked.  Stay up to date on all vaccines. This information is not intended to replace advice given to you by your health care provider. Make sure you discuss any questions you have with your health care provider. Document Released: 02/18/2015 Document Revised: 01/16/2018 Document Reviewed: 01/16/2018 Elsevier Patient Education  [place].

## 2019-02-17 ENCOUNTER — Other Ambulatory Visit: Payer: Self-pay | Admitting: Family Medicine

## 2019-03-16 ENCOUNTER — Other Ambulatory Visit: Payer: Self-pay

## 2019-03-17 ENCOUNTER — Encounter: Payer: Self-pay | Admitting: Family Medicine

## 2019-03-17 ENCOUNTER — Ambulatory Visit (INDEPENDENT_AMBULATORY_CARE_PROVIDER_SITE_OTHER): Payer: Medicare Other | Admitting: Family Medicine

## 2019-03-17 VITALS — BP 136/84 | HR 73 | Temp 97.3°F | Ht 64.0 in | Wt 194.2 lb

## 2019-03-17 DIAGNOSIS — E118 Type 2 diabetes mellitus with unspecified complications: Secondary | ICD-10-CM | POA: Diagnosis not present

## 2019-03-17 DIAGNOSIS — H9203 Otalgia, bilateral: Secondary | ICD-10-CM | POA: Insufficient documentation

## 2019-03-17 DIAGNOSIS — R8290 Unspecified abnormal findings in urine: Secondary | ICD-10-CM

## 2019-03-17 DIAGNOSIS — E785 Hyperlipidemia, unspecified: Secondary | ICD-10-CM

## 2019-03-17 DIAGNOSIS — I10 Essential (primary) hypertension: Secondary | ICD-10-CM

## 2019-03-17 DIAGNOSIS — E038 Other specified hypothyroidism: Secondary | ICD-10-CM | POA: Diagnosis not present

## 2019-03-17 LAB — URINALYSIS, ROUTINE W REFLEX MICROSCOPIC
Bilirubin Urine: NEGATIVE
Ketones, ur: NEGATIVE
Nitrite: POSITIVE — AB
Specific Gravity, Urine: 1.02 (ref 1.000–1.030)
Total Protein, Urine: 30 — AB
Urine Glucose: NEGATIVE
Urobilinogen, UA: 0.2 (ref 0.0–1.0)
pH: 7 (ref 5.0–8.0)

## 2019-03-17 LAB — COMPREHENSIVE METABOLIC PANEL
ALT: 13 U/L (ref 0–35)
AST: 14 U/L (ref 0–37)
Albumin: 4.1 g/dL (ref 3.5–5.2)
Alkaline Phosphatase: 83 U/L (ref 39–117)
BUN: 11 mg/dL (ref 6–23)
CO2: 28 mEq/L (ref 19–32)
Calcium: 9.5 mg/dL (ref 8.4–10.5)
Chloride: 104 mEq/L (ref 96–112)
Creatinine, Ser: 0.78 mg/dL (ref 0.40–1.20)
GFR: 72.1 mL/min (ref >60.00)
Glucose, Bld: 198 mg/dL — ABNORMAL HIGH (ref 70–99)
Potassium: 4 mEq/L (ref 3.5–5.1)
Sodium: 137 mEq/L (ref 135–145)
Total Bilirubin: 0.3 mg/dL (ref 0.2–1.2)
Total Protein: 6.9 g/dL (ref 6.0–8.3)

## 2019-03-17 LAB — MICROALBUMIN / CREATININE URINE RATIO
Creatinine,U: 141 mg/dL
Microalb Creat Ratio: 24.5 mg/g (ref 0.0–30.0)
Microalb, Ur: 34.6 mg/dL — ABNORMAL HIGH (ref 0.0–1.9)

## 2019-03-17 LAB — CBC
HCT: 43 % (ref 36.0–46.0)
Hemoglobin: 13.8 g/dL (ref 12.0–15.0)
MCHC: 32.2 g/dL (ref 30.0–36.0)
MCV: 86.4 fl (ref 78.0–100.0)
Platelets: 184 10*3/uL (ref 150.0–400.0)
RBC: 4.98 Mil/uL (ref 3.87–5.11)
RDW: 14.6 % (ref 11.5–15.5)
WBC: 7.8 10*3/uL (ref 4.0–10.5)

## 2019-03-17 LAB — HEMOGLOBIN A1C: Hgb A1c MFr Bld: 9 % — ABNORMAL HIGH (ref 4.6–6.5)

## 2019-03-17 LAB — TSH: TSH: 2.88 u[IU]/mL (ref 0.35–4.50)

## 2019-03-17 LAB — LDL CHOLESTEROL, DIRECT: Direct LDL: 71 mg/dL

## 2019-03-17 NOTE — Progress Notes (Addendum)
Established Patient Office Visit  Subjective:  Patient ID: EHRIN SHAHEED, female    DOB: 12-20-1945  Age: 74 y.o. MRN: [id number]  CC:  Chief Complaint  Patient presents with  . Establish Care    new pt, c/o sores in both ears x 1 month little itchy and sometimes painful.     HPI Mariah Reed presents for establishment of care by way of transfer.  Being seen today for her hypertension that is been controlled with metoprolol, diabetes under fair control with glipizide, Metformin 3 times daily and elevated cholesterol treated with Mevacor.  She is nonfasting today because she says it is difficult to fast due to her diabetes.  She also would like me to take a look at her ears.  She feels as though there are sores inside of her ear canals.  Patient lives alone.  She has no close family.  Dr. Marjory Lies has been treating her chronic back pain with OxyContin 20 mg every 12 and her insomnia with Ambien 10 mg nightly.  She has been diagnosed with opioid abuse and has signed a pain contract.  Patient denies sleep aberration.  She tells me that she does take the Ambien nightly.  Hypothyroidism is being treated with Synthroid.  Past Medical History:  Diagnosis Date  . Allergy   . COPD (chronic obstructive pulmonary disease) ([place])   . Diabetes mellitus   . Gout   . Hyperlipidemia   . Hypertension   . Low back pain   . Peripheral neuropathy     Past Surgical History:  Procedure Laterality Date  . ABDOMINAL HYSTERECTOMY    . POLYPECTOMY     Colon  . TUBAL LIGATION      Family History  Problem Relation Age of Onset  . Uterine cancer Mother     Social History   Socioeconomic History  . Marital status: Legally Separated    Spouse name: Zyaria Mangiapane  . Number of children: Not on file  . Years of education: Not on file  . Highest education level: Not on file  Occupational History    Employer: DISABLED  Tobacco Use  . Smoking status: Never Smoker  . Smokeless tobacco: Never Used    Substance and Sexual Activity  . Alcohol use: No  . Drug use: No  . Sexual activity: Not on file  Other Topics Concern  . Not on file  Social History Narrative   Pt would like [name] at [phone] (nephew) to be called in case of emergency and ask him to call her daughter [name] at [phone].   Social Determinants of Health   Financial Resource Strain:   . Difficulty of Paying Living Expenses: Not on file  Food Insecurity:   . Worried About Charity fundraiser in the Last Year: Not on file  . Ran Out of Food in the Last Year: Not on file  Transportation Needs:   . Lack of Transportation (Medical): Not on file  . Lack of Transportation (Non-Medical): Not on file  Physical Activity:   . Days of Exercise per Week: Not on file  . Minutes of Exercise per Session: Not on file  Stress:   . Feeling of Stress : Not on file  Social Connections:   . Frequency of Communication with Friends and Family: Not on file  . Frequency of Social Gatherings with Friends and Family: Not on file  . Attends Religious Services: Not on file  . Active Member of  Clubs or Organizations: Not on file  . Attends Archivist Meetings: Not on file  . Marital Status: Not on file  Intimate Partner Violence:   . Fear of Current or Ex-Partner: Not on file  . Emotionally Abused: Not on file  . Physically Abused: Not on file  . Sexually Abused: Not on file    Outpatient Medications Prior to Visit  Medication Sig Dispense Refill  . ACCU-CHEK FASTCLIX LANCETS MISC 1 Device by Does not apply route 3 (three) times daily. Use to check blood sugar up to three times a day 102 each 12  . albuterol (PROVENTIL HFA;VENTOLIN HFA) 108 (90 Base) MCG/ACT inhaler INHALE 2 PUFFS INTO THE LUNGS EVERY 4 HOURS AS NEEDED 18 g PRN  . Cyanocobalamin (B-12 PO) Take by mouth.    . diclofenac sodium (VOLTAREN) 1 % GEL APPLY 2 GRAMS TOPICALLY 4 TIMES DAILY 100 g PRN  . dicyclomine (BENTYL) 10 MG capsule  Take 1 capsule (10 mg total) by mouth 4 (four) times daily -  before meals and at bedtime. 180 capsule 0  . esomeprazole (NEXIUM) 40 MG capsule TAKE 1 CAPSULE BY MOUTH ONCE DAILY 90 capsule 3  . fexofenadine (ALLEGRA) 180 MG tablet Take 180 mg by mouth as needed.      Marland Kitchen FLECTOR 1.3 % PTCH APPLY 1 PATCH TO THE SKIN AS DIRECTED UPTO 2 PER DAY 60 patch 5  . fluticasone (FLONASE) 50 MCG/ACT nasal spray PLACE 2 SPRAYS INTO BOTH NOSTRILS DAILY 16 g 11  . glipiZIDE (GLUCOTROL) 10 MG tablet TAKE 1 TABLET BY MOUTH ONCE A DAY BEFOREBREAKFAST 60 tablet 5  . hydrALAZINE (APRESOLINE) 10 MG tablet TAKE 1 TABLET 3 TIMES A DAY FOR BLOOD PRESSURE 90 tablet 5  . hydrOXYzine (ATARAX/VISTARIL) 50 MG tablet Take 1 tablet (50 mg total) by mouth 3 (three) times daily as needed. 90 tablet 3  . levothyroxine (SYNTHROID) 75 MCG tablet TAKE 1 TABLET BY MOUTH ONCE A DAY 30 tablet 5  . lovastatin (MEVACOR) 40 MG tablet Take 1 tablet (40 mg total) by mouth at bedtime. 90 tablet 3  . metoprolol tartrate (LOPRESSOR) 50 MG tablet TAKE 1 TABLET BY MOUTH TWICE A DAY AS NEEDED FOR INCREASED HEART RATE 60 tablet 5  . nitroGLYCERIN (NITROSTAT) 0.4 MG SL tablet DISSOLVE 1 TABLET UNDER TONGUE AS NEEDEDFOR CHEST PAIN. MAY REPEAT 5 MINUTES APART 3 TIMES IF NEEDED 25 tablet 5  . nystatin (MYCOSTATIN) 100000 UNIT/ML suspension TAKE 1 TEASPOONFUL BY MOUTH 4 TIMES DAILY AS DIRECTED 60 mL 6  . nystatin cream (MYCOSTATIN) APPLY TO AFFECTED AREAS ONCE DAILY AS NEEDED 30 g PRN  . OVER THE COUNTER MEDICATION Anti acid    . OXYCONTIN 20 MG 12 hr tablet TAKE 1 TABLET BY MOUTH TWICE A DAY 60 tablet 0  . PATADAY 0.2 % SOLN PLACE 1 DROP INTO BOTH EYES EVERY DAY 2.5 mL 2  . pregabalin (LYRICA) 50 MG capsule TAKE 1 CAPSULE BY MOUTH TWICE DAILY 60 capsule 2  . tiotropium (SPIRIVA HANDIHALER) 18 MCG inhalation capsule INHALE THE CONTENTS OF 1 CAPSULE VIA HANDIHALER BY MOUTH ONCE DAILY 30 capsule 11  . topiramate (TOPAMAX) 100 MG tablet TAKE 3 TABLETS BY  MOUTH ONCE DAILY 90 tablet 5  . VITAMIN D PO Take 1,000 mg by mouth.    . zolpidem (AMBIEN) 10 MG tablet TAKE 1 TABLET BY MOUTH AT BEDTIME 30 tablet 0  . metFORMIN (GLUCOPHAGE) 500 MG tablet Take 1 tablet (500 mg total) by mouth  2 (two) times daily with a meal. 180 tablet 3   No facility-administered medications prior to visit.    Allergies  Allergen Reactions  . Levofloxacin Hives    ROS Review of Systems  Constitutional: Negative.   HENT: Negative.   Respiratory: Negative.   Cardiovascular: Negative.   Gastrointestinal: Negative.   Endocrine: Negative for polyphagia and polyuria.  Genitourinary: Negative.   Musculoskeletal: Positive for arthralgias and back pain.  Neurological: Negative for seizures and speech difficulty.  Hematological: Negative.   Psychiatric/Behavioral: Positive for sleep disturbance. Negative for dysphoric mood.   Depression screen [facility] 2/9 03/17/2019 01/14/2019 08/26/2018  Decreased Interest 0 0 0  Down, Depressed, Hopeless 0 0 0  PHQ - 2 Score 0 0 0  Some recent data might be hidden      Objective:    Physical Exam  Constitutional: She is oriented to person, place, and time. She appears well-developed and well-nourished. No distress.  HENT:  Head: Normocephalic and atraumatic.  Right Ear: Tympanic membrane, external ear and ear canal normal.  Left Ear: Tympanic membrane, external ear and ear canal normal.  Eyes: Pupils are equal, round, and reactive to light. Conjunctivae are normal. Right eye exhibits no discharge. Left eye exhibits no discharge. No scleral icterus.  Neck: No JVD present. No tracheal deviation present. No thyromegaly present.  Cardiovascular: Normal rate, regular rhythm and normal heart sounds.  Pulses:      Dorsalis pedis pulses are 2+ on the right side and 1+ on the left side.       Posterior tibial pulses are 1+ on the right side and 1+ on the left side.  Pulmonary/Chest: Effort normal and breath sounds normal. No stridor.    Lymphadenopathy:    She has no cervical adenopathy.  Neurological: She is alert and oriented to person, place, and time.  Skin: Skin is warm and dry. She is not diaphoretic.  Psychiatric: She has a normal mood and affect. Her behavior is normal.    BP 136/84   Pulse 73   Temp (!) 97.3 F (36.3 C) (Tympanic)   Ht 5\' 4"  (1.626 m)   Wt 194 lb 3.2 oz (88.1 kg)   SpO2 96%   BMI 33.33 kg/m  Wt Readings from Last 3 Encounters:  03/17/19 194 lb 3.2 oz (88.1 kg)  12/30/18 209 lb (94.8 kg)  02/10/18 209 lb (94.8 kg)     Health Maintenance Due  Topic Date Due  . OPHTHALMOLOGY EXAM  09/13/1954  . TETANUS/TDAP  09/13/1963  . DEXA SCAN  09/12/2009    There are no preventive care reminders to display for this patient.  Lab Results  Component Value Date   TSH 2.88 03/17/2019   Lab Results  Component Value Date   WBC 7.8 03/17/2019   HGB 13.8 03/17/2019   HCT 43.0 03/17/2019   MCV 86.4 03/17/2019   PLT 184.0 03/17/2019   Lab Results  Component Value Date   NA 137 03/17/2019   K 4.0 03/17/2019   CO2 28 03/17/2019   GLUCOSE 198 (H) 03/17/2019   BUN 11 03/17/2019   CREATININE 0.78 03/17/2019   BILITOT 0.3 03/17/2019   ALKPHOS 83 03/17/2019   AST 14 03/17/2019   ALT 13 03/17/2019   PROT 6.9 03/17/2019   ALBUMIN 4.1 03/17/2019   CALCIUM 9.5 03/17/2019   GFR 72.10 03/17/2019   Lab Results  Component Value Date   CHOL 199 02/10/2018   CHOL 196 02/10/2018   Lab Results  Component  Value Date   HDL 51.90 02/10/2018   HDL 54 02/10/2018   Lab Results  Component Value Date   LDLCALC 112 (H) 02/10/2018   LDLCALC 113 (H) 02/10/2018   Lab Results  Component Value Date   TRIG 172.0 (H) 02/10/2018   TRIG 169 (H) 02/10/2018   Lab Results  Component Value Date   CHOLHDL 4 02/10/2018   CHOLHDL 3.6 02/10/2018   Lab Results  Component Value Date   HGBA1C 9.0 (H) 03/17/2019      Assessment & Plan:   Problem List Items Addressed This Visit      Cardiovascular  and Mediastinum   Essential hypertension - Primary   Relevant Orders   CBC (Completed)   Comprehensive metabolic panel (Completed)   Urinalysis, Routine w reflex microscopic (Completed)   Microalbumin / creatinine urine ratio (Completed)     Endocrine   Diabetes mellitus type 2 with complications (HCC)   Relevant Medications   metFORMIN (GLUCOPHAGE) 500 MG tablet   Other Relevant Orders   CBC (Completed)   Comprehensive metabolic panel (Completed)   Hemoglobin A1c (Completed)   Urinalysis, Routine w reflex microscopic (Completed)   Microalbumin / creatinine urine ratio (Completed)   Ambulatory referral to Ophthalmology   Hypothyroidism   Relevant Orders   TSH (Completed)     Other   HLD (hyperlipidemia)   Relevant Orders   Comprehensive metabolic panel (Completed)   LDL cholesterol, direct (Completed)   Otalgia of both ears   Abnormal urinalysis   Relevant Orders   Urine Culture   Urinalysis, Routine w reflex microscopic      Meds ordered this encounter  Medications  . metFORMIN (GLUCOPHAGE) 500 MG tablet    Sig: Take 2 tablets (1,000 mg total) by mouth 2 (two) times daily with a meal.    Dispense:  360 tablet    Refill:  3    Follow-up: Return in about 3 months (around 06/14/2019).  See no problem with her ears.  Offered her referral to ENT for a second opinion and she said that she is reluctant to go out at this time.  Patient lives alone currently but tells me that her daughter who lives away is planning on coming to stay with her.  She is aware that refills of her pain medicine will require separate visits.  Patient will need close follow-up.  Libby Maw, MD   2/11 addendum: Diabetes under poor control increasing Glucophage to 1000 mg twice daily.  Grossly abnormal urinalysis.  Asking patient to return for repeat UA with culture.

## 2019-03-18 ENCOUNTER — Ambulatory Visit: Payer: Medicare Other | Admitting: Family Medicine

## 2019-03-18 DIAGNOSIS — R8290 Unspecified abnormal findings in urine: Secondary | ICD-10-CM | POA: Insufficient documentation

## 2019-03-18 NOTE — Addendum Note (Signed)
Addended by: Jon Billings on: 03/18/2019 11:33 AM   Modules accepted: Orders

## 2019-03-19 ENCOUNTER — Other Ambulatory Visit: Payer: Self-pay | Admitting: Family Medicine

## 2019-03-19 ENCOUNTER — Encounter: Payer: Medicare Other | Admitting: Family Medicine

## 2019-03-19 MED ORDER — METFORMIN HCL 500 MG PO TABS: 1000.0000 mg | ORAL_TABLET | Freq: Two times a day (BID) | ORAL | 3 refills | Status: DC

## 2019-03-19 NOTE — Addendum Note (Signed)
Addended by: Jon Billings on: 03/19/2019 09:01 AM   Modules accepted: Orders

## 2019-03-19 NOTE — Telephone Encounter (Signed)
WK-Plz see refill req/thx dmf °

## 2019-03-20 ENCOUNTER — Telehealth: Payer: Self-pay | Admitting: Family Medicine

## 2019-03-20 NOTE — Telephone Encounter (Signed)
Mariah Reed is calling and requesting a refill for Ambien sent to [facility]. CB [phone]

## 2019-03-23 ENCOUNTER — Telehealth: Payer: Self-pay | Admitting: Family Medicine

## 2019-03-23 ENCOUNTER — Other Ambulatory Visit: Payer: Self-pay

## 2019-03-23 NOTE — Telephone Encounter (Signed)
Refill request for Zolpidem tartrate 10mg . Last office visit 03/17/19 medications pending for your approval/denial. Please advise.

## 2019-03-23 NOTE — Telephone Encounter (Signed)
Per Dr. Ethelene Hal pt needing a visit for evaluation. Appointment scheduled.

## 2019-03-23 NOTE — Telephone Encounter (Signed)
Refill request on Zolpidem Tartrate 10 mg.

## 2019-03-23 NOTE — Telephone Encounter (Signed)
Patient is calling and wanted to speak to someone regarding her recent urinalysis. CB is [phone]

## 2019-03-24 ENCOUNTER — Other Ambulatory Visit: Payer: Self-pay | Admitting: Family Medicine

## 2019-03-24 NOTE — Telephone Encounter (Signed)
Last OV 03/17/19 Last fill 02/17/19  #30/0

## 2019-03-24 NOTE — Telephone Encounter (Signed)
Needs virtual visit 

## 2019-03-25 ENCOUNTER — Encounter: Payer: Self-pay | Admitting: Family Medicine

## 2019-03-25 ENCOUNTER — Ambulatory Visit (INDEPENDENT_AMBULATORY_CARE_PROVIDER_SITE_OTHER): Payer: Medicare Other | Admitting: Family Medicine

## 2019-03-25 VITALS — Ht 64.0 in | Wt 194.0 lb

## 2019-03-25 DIAGNOSIS — G4701 Insomnia due to medical condition: Secondary | ICD-10-CM

## 2019-03-25 MED ORDER — ZOLPIDEM TARTRATE 5 MG PO TABS: 5.0000 mg | ORAL_TABLET | Freq: Every evening | ORAL | 0 refills | Status: DC | PRN

## 2019-03-25 NOTE — Progress Notes (Signed)
Established Patient Office Visit  Subjective:  Patient ID: Mariah Reed, female    DOB: [birth year]  Age: 74 y.o. MRN: [id number]  CC:  Chief Complaint  Patient presents with  . Follow-up    refill on Ambien 10mg     HPI Mariah Reed presents for her insomnia. She is taking high dose Ambien.  She has tried no other medications she tells me.  Is alone.  She has chronic back pain.  She takes OxyContin 20 mg twice daily and Lyrica 50 mg twice daily as well.  She denies sleep aberration.  I expressed my concern about her current medical regimen.  She tells me that she usually just takes 5 mg of Ambien.  Despite dosing with extended release OxyContin and Lyrica patient says that her back pain prevents her from sleeping.  She has muscle spasms as well open her upper back.  Tells me that muscle relaxants in the past have not worked for her.   Past Medical History:  Diagnosis Date  . Allergy   . COPD (chronic obstructive pulmonary disease) ([place])   . Diabetes mellitus   . Gout   . Hyperlipidemia   . Hypertension   . Low back pain   . Peripheral neuropathy     Past Surgical History:  Procedure Laterality Date  . ABDOMINAL HYSTERECTOMY    . POLYPECTOMY     Colon  . TUBAL LIGATION      Family History  Problem Relation Age of Onset  . Uterine cancer Mother     Social History   Socioeconomic History  . Marital status: Legally Separated    Spouse name: Mariah Reed  . Number of children: Not on file  . Years of education: Not on file  . Highest education level: Not on file  Occupational History    Employer: DISABLED  Tobacco Use  . Smoking status: Never Smoker  . Smokeless tobacco: Never Used  Substance and Sexual Activity  . Alcohol use: No  . Drug use: No  . Sexual activity: Not on file  Other Topics Concern  . Not on file  Social History Narrative   Pt would like [name] at [phone] (nephew) to be called in case of emergency and ask him to call  her daughter Mariah Reed at [phone].   Social Determinants of Health   Financial Resource Strain:   . Difficulty of Paying Living Expenses: Not on file  Food Insecurity:   . Worried About Charity fundraiser in the Last Year: Not on file  . Ran Out of Food in the Last Year: Not on file  Transportation Needs:   . Lack of Transportation (Medical): Not on file  . Lack of Transportation (Non-Medical): Not on file  Physical Activity:   . Days of Exercise per Week: Not on file  . Minutes of Exercise per Session: Not on file  Stress:   . Feeling of Stress : Not on file  Social Connections:   . Frequency of Communication with Friends and Family: Not on file  . Frequency of Social Gatherings with Friends and Family: Not on file  . Attends Religious Services: Not on file  . Active Member of Clubs or Organizations: Not on file  . Attends Archivist Meetings: Not on file  . Marital Status: Not on file  Intimate Partner Violence:   . Fear of Current or Ex-Partner: Not on file  . Emotionally Abused: Not on file  . Physically  Abused: Not on file  . Sexually Abused: Not on file    Outpatient Medications Prior to Visit  Medication Sig Dispense Refill  . ACCU-CHEK FASTCLIX LANCETS MISC 1 Device by Does not apply route 3 (three) times daily. Use to check blood sugar up to three times a day 102 each 12  . albuterol (PROVENTIL HFA;VENTOLIN HFA) 108 (90 Base) MCG/ACT inhaler INHALE 2 PUFFS INTO THE LUNGS EVERY 4 HOURS AS NEEDED 18 g PRN  . Cyanocobalamin (B-12 PO) Take by mouth.    . diclofenac sodium (VOLTAREN) 1 % GEL APPLY 2 GRAMS TOPICALLY 4 TIMES DAILY 100 g PRN  . esomeprazole (NEXIUM) 40 MG capsule TAKE 1 CAPSULE BY MOUTH ONCE DAILY 90 capsule 3  . fexofenadine (ALLEGRA) 180 MG tablet Take 180 mg by mouth as needed.      Marland Kitchen FLECTOR 1.3 % PTCH APPLY 1 PATCH TO THE SKIN AS DIRECTED UPTO 2 PER DAY 60 patch 5  . fluticasone (FLONASE) 50 MCG/ACT nasal spray PLACE 2 SPRAYS INTO  BOTH NOSTRILS DAILY 16 g 11  . hydrALAZINE (APRESOLINE) 10 MG tablet TAKE 1 TABLET 3 TIMES A DAY FOR BLOOD PRESSURE 90 tablet 5  . levothyroxine (SYNTHROID) 75 MCG tablet TAKE 1 TABLET BY MOUTH ONCE A DAY 30 tablet 5  . lovastatin (MEVACOR) 40 MG tablet Take 1 tablet (40 mg total) by mouth at bedtime. 90 tablet 3  . metFORMIN (GLUCOPHAGE) 500 MG tablet Take 2 tablets (1,000 mg total) by mouth 2 (two) times daily with a meal. 360 tablet 3  . metoprolol tartrate (LOPRESSOR) 50 MG tablet TAKE 1 TABLET BY MOUTH TWICE A DAY AS NEEDED FOR INCREASED HEART RATE 60 tablet 5  . nitroGLYCERIN (NITROSTAT) 0.4 MG SL tablet DISSOLVE 1 TABLET UNDER TONGUE AS NEEDEDFOR CHEST PAIN. MAY REPEAT 5 MINUTES APART 3 TIMES IF NEEDED 25 tablet 5  . nystatin (MYCOSTATIN) 100000 UNIT/ML suspension TAKE 1 TEASPOONFUL BY MOUTH 4 TIMES DAILY AS DIRECTED 60 mL 6  . nystatin cream (MYCOSTATIN) APPLY TO AFFECTED AREAS ONCE DAILY AS NEEDED 30 g PRN  . OVER THE COUNTER MEDICATION Anti acid    . OXYCONTIN 20 MG 12 hr tablet TAKE 1 TABLET BY MOUTH TWICE A DAY 60 tablet 0  . PATADAY 0.2 % SOLN PLACE 1 DROP INTO BOTH EYES EVERY DAY 2.5 mL 2  . pregabalin (LYRICA) 50 MG capsule TAKE 1 CAPSULE BY MOUTH TWICE DAILY 60 capsule 2  . tiotropium (SPIRIVA HANDIHALER) 18 MCG inhalation capsule INHALE THE CONTENTS OF 1 CAPSULE VIA HANDIHALER BY MOUTH ONCE DAILY 30 capsule 11  . topiramate (TOPAMAX) 100 MG tablet TAKE 3 TABLETS BY MOUTH ONCE DAILY 90 tablet 5  . VITAMIN D PO Take 1,000 mg by mouth.    . zolpidem (AMBIEN) 10 MG tablet TAKE 1 TABLET BY MOUTH AT BEDTIME 30 tablet 0  . dicyclomine (BENTYL) 10 MG capsule Take 1 capsule (10 mg total) by mouth 4 (four) times daily -  before meals and at bedtime. (Patient not taking: Reported on 03/25/2019) 180 capsule 0  . glipiZIDE (GLUCOTROL) 10 MG tablet TAKE 1 TABLET BY MOUTH ONCE A DAY BEFOREBREAKFAST (Patient not taking: Reported on 03/25/2019) 60 tablet 5  . hydrOXYzine (ATARAX/VISTARIL) 50 MG  tablet Take 1 tablet (50 mg total) by mouth 3 (three) times daily as needed. (Patient not taking: Reported on 03/25/2019) 90 tablet 3   No facility-administered medications prior to visit.    Allergies  Allergen Reactions  . Levofloxacin Hives  ROS Review of Systems  Constitutional: Negative.   Respiratory: Negative.   Cardiovascular: Negative.   Gastrointestinal: Negative.   Musculoskeletal: Positive for back pain and myalgias.      Objective:    Physical Exam  Constitutional: She is oriented to person, place, and time. No distress.  Pulmonary/Chest: Effort normal.  Neurological: She is alert and oriented to person, place, and time.  Psychiatric: She has a normal mood and affect. Her behavior is normal.    Ht 5\' 4"  (1.626 m)   Wt 194 lb (88 kg) Comment: per pt  BMI 33.30 kg/m  Wt Readings from Last 3 Encounters:  03/25/19 194 lb (88 kg)  03/17/19 194 lb 3.2 oz (88.1 kg)  12/30/18 209 lb (94.8 kg)     Health Maintenance Due  Topic Date Due  . OPHTHALMOLOGY EXAM  09/13/1954  . TETANUS/TDAP  09/13/1963  . DEXA SCAN  09/12/2009    There are no preventive care reminders to display for this patient.  Lab Results  Component Value Date   TSH 2.88 03/17/2019   Lab Results  Component Value Date   WBC 7.8 03/17/2019   HGB 13.8 03/17/2019   HCT 43.0 03/17/2019   MCV 86.4 03/17/2019   PLT 184.0 03/17/2019   Lab Results  Component Value Date   NA 137 03/17/2019   K 4.0 03/17/2019   CO2 28 03/17/2019   GLUCOSE 198 (H) 03/17/2019   BUN 11 03/17/2019   CREATININE 0.78 03/17/2019   BILITOT 0.3 03/17/2019   ALKPHOS 83 03/17/2019   AST 14 03/17/2019   ALT 13 03/17/2019   PROT 6.9 03/17/2019   ALBUMIN 4.1 03/17/2019   CALCIUM 9.5 03/17/2019   GFR 72.10 03/17/2019   Lab Results  Component Value Date   CHOL 199 02/10/2018   CHOL 196 02/10/2018   Lab Results  Component Value Date   HDL 51.90 02/10/2018   HDL 54 02/10/2018   Lab Results  Component  Value Date   LDLCALC 112 (H) 02/10/2018   LDLCALC 113 (H) 02/10/2018   Lab Results  Component Value Date   TRIG 172.0 (H) 02/10/2018   TRIG 169 (H) 02/10/2018   Lab Results  Component Value Date   CHOLHDL 4 02/10/2018   CHOLHDL 3.6 02/10/2018   Lab Results  Component Value Date   HGBA1C 9.0 (H) 03/17/2019      Assessment & Plan:   Problem List Items Addressed This Visit    None    Visit Diagnoses    Insomnia due to medical condition    -  Primary   Relevant Medications   zolpidem (AMBIEN) 5 MG tablet      Meds ordered this encounter  Medications  . zolpidem (AMBIEN) 5 MG tablet    Sig: Take 1 tablet (5 mg total) by mouth at bedtime as needed for sleep.    Dispense:  90 tablet    Refill:  0    Follow-up: Return in about 3 months (around 06/22/2019).   Again, expressed concern about patient's current medical regimen.  Agreed to meet her in the middle we like to play with the decreased dose of Ambien to 5 mg nightly as needed for sleep.  At first patient had said that she had tried no other medicines but then believes that she had tried other medicines for sleep.  Patient is aware that future refills for OxyContin will be with a dedicated in person office visit.   Libby Maw, MD  Virtual Visit via Video Note  I connected with ELLYCE VESPER on 03/25/19 at  9:30 AM EST by a video enabled telemedicine application and verified that I am speaking with the correct person using two identifiers.  Location: Patient: home alone Provider:   I discussed the limitations of evaluation and management by telemedicine and the availability of in person appointments. The patient expressed understanding and agreed to proceed.  History of Present Illness:    Observations/Objective:   Assessment and Plan:   Follow Up Instructions:    I discussed the assessment and treatment plan with the patient. The patient was provided an opportunity to ask questions and all  were answered. The patient agreed with the plan and demonstrated an understanding of the instructions.  Interactive video and audio telecommunications were attempted between myself and the patient. However they failed due to the patient having technical difficulties or not having access to video capability. We continued and completed with audio only.   The patient was advised to call back or seek an in-person evaluation if the symptoms worsen or if the condition fails to improve as anticipated.  I provided 25 minutes of non-face-to-face time during this encounter.   Libby Maw, MD   Interactive video and audio telecommunications were attempted between myself and the patient. However they failed due to the patient having technical difficulties or not having access to video capability. We continued and completed with audio only.Interactive video and audio telecommunications were attempted between myself and the patient. However they failed due to the patient having technical difficulties or not having access to video capability. We continued and completed with audio only.Interactive video and audio telecommunications were attempted between myself and the patient. However they failed due to the patient having technical difficulties or not having access to video capability. We continued and completed with audio only.

## 2019-03-25 NOTE — Telephone Encounter (Signed)
Spoke with patient who wanted to let Dr. Ethelene Hal know that her urinalysis results were abnormal because she usually will drink a lot of coffee to help with bad headaches and she forgot to drink her cranberry juice prior to coming in to give the urine sample. Per patient her urine will be much improved at her next visit.

## 2019-03-25 NOTE — Telephone Encounter (Signed)
Pt had virtual visit today. 

## 2019-03-30 NOTE — Telephone Encounter (Signed)
Pt had a VV with Dr. Ethelene Hal.

## 2019-04-01 ENCOUNTER — Telehealth: Payer: Self-pay

## 2019-04-01 NOTE — Telephone Encounter (Signed)
Not taking new patients.

## 2019-04-01 NOTE — Telephone Encounter (Signed)
Please see recent notes from her current primary provider dated 03/25/19   Copied from [phone]. Topic: Appointment Scheduling - Scheduling Inquiry for Clinic >> Apr 01, 2019  3:43 PM Erick Blinks wrote: Reason for CRM: Pt called and wants to establish with PCP Dr. Rosanna Randy, [place] contact: [phone], pt takes the bus to appt currently miles away from her home and she states that the bumpy ride is extremely painful for her spine condition.

## 2019-04-02 NOTE — Telephone Encounter (Signed)
Advised 

## 2019-04-02 NOTE — Telephone Encounter (Signed)
LMTCB

## 2019-04-17 ENCOUNTER — Other Ambulatory Visit: Payer: Self-pay

## 2019-04-17 MED ORDER — LOVASTATIN 40 MG PO TABS: 40.0000 mg | ORAL_TABLET | Freq: Every day | ORAL | 3 refills | Status: DC

## 2019-04-17 MED ORDER — GLIPIZIDE 10 MG PO TABS: ORAL_TABLET | ORAL | 1 refills | Status: DC

## 2019-04-20 ENCOUNTER — Other Ambulatory Visit: Payer: Self-pay

## 2019-04-20 MED ORDER — HYDROXYZINE HCL 50 MG PO TABS: 50.0000 mg | ORAL_TABLET | Freq: Three times a day (TID) | ORAL | 1 refills | Status: DC | PRN

## 2019-04-20 MED ORDER — LEVOTHYROXINE SODIUM 75 MCG PO TABS: 75.0000 ug | ORAL_TABLET | Freq: Every day | ORAL | 1 refills | Status: DC

## 2019-04-21 ENCOUNTER — Other Ambulatory Visit: Payer: Self-pay | Admitting: Family Medicine

## 2019-04-22 ENCOUNTER — Other Ambulatory Visit: Payer: Self-pay | Admitting: Family Medicine

## 2019-04-22 NOTE — Telephone Encounter (Signed)
Refill request last office visit 03/25/19. Please advise

## 2019-05-20 ENCOUNTER — Other Ambulatory Visit: Payer: Self-pay | Admitting: Family Medicine

## 2019-05-20 NOTE — Telephone Encounter (Signed)
Refill request for pending medication last OV Feb. 2021

## 2019-05-22 ENCOUNTER — Other Ambulatory Visit: Payer: Self-pay

## 2019-05-22 MED ORDER — ALBUTEROL SULFATE HFA 108 (90 BASE) MCG/ACT IN AERS: INHALATION_SPRAY | RESPIRATORY_TRACT | 11 refills | Status: DC

## 2019-06-02 DIAGNOSIS — J0101 Acute recurrent maxillary sinusitis: Secondary | ICD-10-CM | POA: Diagnosis not present

## 2019-06-17 ENCOUNTER — Other Ambulatory Visit: Payer: Self-pay | Admitting: Family Medicine

## 2019-06-17 DIAGNOSIS — G4701 Insomnia due to medical condition: Secondary | ICD-10-CM

## 2019-06-18 ENCOUNTER — Other Ambulatory Visit: Payer: Self-pay | Admitting: Family Medicine

## 2019-06-19 ENCOUNTER — Other Ambulatory Visit: Payer: Self-pay | Admitting: Family Medicine

## 2019-07-13 DIAGNOSIS — Z23 Encounter for immunization: Secondary | ICD-10-CM | POA: Diagnosis not present

## 2019-07-15 ENCOUNTER — Other Ambulatory Visit: Payer: Self-pay

## 2019-07-16 ENCOUNTER — Encounter: Payer: Self-pay | Admitting: Family Medicine

## 2019-07-16 ENCOUNTER — Ambulatory Visit (INDEPENDENT_AMBULATORY_CARE_PROVIDER_SITE_OTHER): Payer: Medicare Other | Admitting: Family Medicine

## 2019-07-16 VITALS — BP 162/80 | HR 79 | Temp 97.4°F | Ht 64.0 in | Wt 194.2 lb

## 2019-07-16 DIAGNOSIS — F1110 Opioid abuse, uncomplicated: Secondary | ICD-10-CM

## 2019-07-16 DIAGNOSIS — I10 Essential (primary) hypertension: Secondary | ICD-10-CM

## 2019-07-16 DIAGNOSIS — Z79899 Other long term (current) drug therapy: Secondary | ICD-10-CM

## 2019-07-16 DIAGNOSIS — M545 Low back pain, unspecified: Secondary | ICD-10-CM

## 2019-07-16 DIAGNOSIS — G4701 Insomnia due to medical condition: Secondary | ICD-10-CM | POA: Diagnosis not present

## 2019-07-16 MED ORDER — PREGABALIN 25 MG PO CAPS: 25.0000 mg | ORAL_CAPSULE | Freq: Two times a day (BID) | ORAL | 0 refills | Status: DC

## 2019-07-16 MED ORDER — LISINOPRIL 20 MG PO TABS: 20.0000 mg | ORAL_TABLET | Freq: Every day | ORAL | 0 refills | Status: DC

## 2019-07-16 MED ORDER — OXYCONTIN 20 MG PO T12A: 20.0000 mg | EXTENDED_RELEASE_TABLET | Freq: Two times a day (BID) | ORAL | 0 refills | Status: DC

## 2019-07-16 MED ORDER — OXYCODONE HCL ER 20 MG PO T12A: 20.0000 mg | EXTENDED_RELEASE_TABLET | Freq: Two times a day (BID) | ORAL | 0 refills | Status: DC

## 2019-07-16 NOTE — Progress Notes (Signed)
Established Patient Office Visit  Subjective:  Patient ID: Mariah Reed, female    DOB: 01/23/1946  Age: 74 y.o. MRN: [id number]  CC:  Chief Complaint  Patient presents with  . Insomnia    Patient is here today for a 58-month-F/U.  At 2.17.21 visit her Ambien was decreased to 5mg  1qhs. She states that she got her 1st Covid vaccine on Monday.  She states that the decreased dosage of Ambien is working well for her.  . Pain    Patient is also here today to F/U with chronic pain.  She currently takes Oxycontin 20mg  1bid and Lyrica 50mg  1bid. She does not want to decrease that. She has had to decrease the Lyrica to 1qd because when she takes the 2nd dosage it causes her to be weak. She says that she did not respond at all to Gabapentin. I asked if she had tried Horizon (ER form of Gabapentin)    HPI NASHIKA COKER presents for follow-up of her chronic back pain and peripheral neuropathy.  She has been having difficulty tolerating the Lyrica.  She is only tolerating 1 dose the second dose makes her weak but she continues to have burning paresthesias that run from her feet up to her spine into her head.  Continues to take extended release oxycodone twice daily.  Blood pressure has been running higher at home.  Past Medical History:  Diagnosis Date  . Allergy   . COPD (chronic obstructive pulmonary disease) ([place])   . Diabetes mellitus   . Gout   . Hyperlipidemia   . Hypertension   . Low back pain   . Peripheral neuropathy     Past Surgical History:  Procedure Laterality Date  . ABDOMINAL HYSTERECTOMY    . POLYPECTOMY     Colon  . TUBAL LIGATION      Family History  Problem Relation Age of Onset  . Uterine cancer Mother     Social History   Socioeconomic History  . Marital status: Legally Separated    Spouse name: Simone Rodenbeck  . Number of children: Not on file  . Years of education: Not on file  . Highest education level: Not on file  Occupational History    Employer:  DISABLED  Tobacco Use  . Smoking status: Never Smoker  . Smokeless tobacco: Never Used  Substance and Sexual Activity  . Alcohol use: No  . Drug use: No  . Sexual activity: Not on file  Other Topics Concern  . Not on file  Social History Narrative   Pt would like [name] at [phone] (nephew) to be called in case of emergency and ask him to call her daughter [name] at [phone].   Social Determinants of Health   Financial Resource Strain:   . Difficulty of Paying Living Expenses:   Food Insecurity:   . Worried About Charity fundraiser in the Last Year:   . Arboriculturist in the Last Year:   Transportation Needs:   . Film/video editor (Medical):   Marland Kitchen Lack of Transportation (Non-Medical):   Physical Activity:   . Days of Exercise per Week:   . Minutes of Exercise per Session:   Stress:   . Feeling of Stress :   Social Connections:   . Frequency of Communication with Friends and Family:   . Frequency of Social Gatherings with Friends and Family:   . Attends Religious Services:   . Active Member of Clubs  or Organizations:   . Attends Archivist Meetings:   Marland Kitchen Marital Status:   Intimate Partner Violence:   . Fear of Current or Ex-Partner:   . Emotionally Abused:   Marland Kitchen Physically Abused:   . Sexually Abused:     Outpatient Medications Prior to Visit  Medication Sig Dispense Refill  . ACCU-CHEK FASTCLIX LANCETS MISC 1 Device by Does not apply route 3 (three) times daily. Use to check blood sugar up to three times a day 102 each 12  . albuterol (VENTOLIN HFA) 108 (90 Base) MCG/ACT inhaler INHALE 2 PUFFS INTO THE LUNGS EVERY 4 HOURS AS NEEDED 18 g 11  . Cyanocobalamin (B-12 PO) Take by mouth.    . diclofenac sodium (VOLTAREN) 1 % GEL APPLY 2 GRAMS TOPICALLY 4 TIMES DAILY 100 g PRN  . esomeprazole (NEXIUM) 40 MG capsule TAKE 1 CAPSULE BY MOUTH ONCE DAILY 90 capsule 3  . fexofenadine (ALLEGRA) 180 MG tablet Take 180 mg by mouth as needed.       Marland Kitchen FLECTOR 1.3 % PTCH APPLY 1 PATCH TO THE SKIN AS DIRECTED UPTO 2 PER DAY 60 patch 5  . fluticasone (FLONASE) 50 MCG/ACT nasal spray PLACE 2 SPRAYS INTO BOTH NOSTRILS DAILY 16 g 11  . glipiZIDE (GLUCOTROL) 10 MG tablet Take 1bid (changed at OV on 7.21.2020 due to A1C @ 9.3) 180 tablet 1  . hydrALAZINE (APRESOLINE) 10 MG tablet TAKE 1 TABLET BY MOUTH 3 TIMES DAILY FORBLOOD PRESSURE 90 tablet 5  . hydrOXYzine (ATARAX/VISTARIL) 50 MG tablet Take 1 tablet (50 mg total) by mouth 3 (three) times daily as needed. 270 tablet 1  . levothyroxine (SYNTHROID) 75 MCG tablet Take 1 tablet (75 mcg total) by mouth daily. 90 tablet 1  . lovastatin (MEVACOR) 40 MG tablet Take 1 tablet (40 mg total) by mouth at bedtime. 90 tablet 3  . metFORMIN (GLUCOPHAGE) 500 MG tablet Take 2 tablets (1,000 mg total) by mouth 2 (two) times daily with a meal. 360 tablet 3  . metoprolol tartrate (LOPRESSOR) 50 MG tablet TAKE 1 TABLET BY MOUTH TWICE A DAY AS NEEDED FOR INCREASED HEART RATE 60 tablet 5  . nitroGLYCERIN (NITROSTAT) 0.4 MG SL tablet DISSOLVE 1 TABLET UNDER TONGUE AS NEEDEDFOR CHEST PAIN. MAY REPEAT 5 MINUTES APART 3 TIMES IF NEEDED 25 tablet 5  . nystatin (MYCOSTATIN) 100000 UNIT/ML suspension TAKE 1 TEASPOONFUL BY MOUTH 4 TIMES DAILY AS DIRECTED 60 mL 6  . nystatin cream (MYCOSTATIN) APPLY TO AFFECTED AREAS ONCE DAILY AS NEEDED 30 g PRN  . PATADAY 0.2 % SOLN PLACE 1 DROP INTO BOTH EYES EVERY DAY 2.5 mL 2  . tiotropium (SPIRIVA HANDIHALER) 18 MCG inhalation capsule INHALE THE CONTENTS OF 1 CAPSUOLE VIA HANDIHALER ONCE DAILY 30 capsule 11  . topiramate (TOPAMAX) 100 MG tablet TAKE 3 TABLETS BY MOUTH ONCE DAILY 90 tablet 5  . VITAMIN D PO Take 1,000 mg by mouth.    . zolpidem (AMBIEN) 5 MG tablet TAKE 1 TABLET BY MOUTH AT BEDTIME AS NEEDED FOR SLEEP 90 tablet 0  . OXYCONTIN 20 MG 12 hr tablet TAKE 1 TABLET BY MOUTH EVERY 12 HOURS 60 tablet 0  . pregabalin (LYRICA) 50 MG capsule TAKE 1 CAPSULE BY MOUTH TWICE DAILY  (Patient taking differently: Take 50 mg by mouth daily. ) 60 capsule 2  . dicyclomine (BENTYL) 10 MG capsule Take 1 capsule (10 mg total) by mouth 4 (four) times daily -  before meals and at bedtime. (Patient not taking:  Reported on 03/25/2019) 180 capsule 0  . FLECTOR 1.3 % PTCH APPLY 1 PATCH TO THE SKIN AS DIRECTED UPTO 2 PER DAY 30 patch 2  . OVER THE COUNTER MEDICATION Anti acid     No facility-administered medications prior to visit.    Allergies  Allergen Reactions  . Levofloxacin Hives    ROS Review of Systems  Constitutional: Negative.   HENT: Negative.   Respiratory: Negative.   Cardiovascular: Negative.   Gastrointestinal: Negative.   Genitourinary: Negative.   Musculoskeletal: Positive for back pain and myalgias.  Neurological: Positive for numbness. Negative for weakness.      Objective:    Physical Exam Vitals and nursing note reviewed.  Constitutional:      General: She is not in acute distress.    Appearance: Normal appearance. She is obese. She is not ill-appearing, toxic-appearing or diaphoretic.  HENT:     Head: Normocephalic and atraumatic.     Right Ear: External ear normal.     Left Ear: External ear normal.  Eyes:     Extraocular Movements: Extraocular movements intact.     Conjunctiva/sclera: Conjunctivae normal.     Pupils: Pupils are equal, round, and reactive to light.  Cardiovascular:     Rate and Rhythm: Normal rate and regular rhythm.  Pulmonary:     Effort: Pulmonary effort is normal.     Breath sounds: Normal breath sounds.  Abdominal:     General: Abdomen is flat. Bowel sounds are normal.     Palpations: Abdomen is soft.  Musculoskeletal:     Right lower leg: No edema.     Left lower leg: No edema.  Neurological:     Mental Status: She is alert and oriented to person, place, and time.  Psychiatric:        Mood and Affect: Mood normal.        Behavior: Behavior normal.     BP (!) 162/80 (BP Location: Right Arm, Patient  Position: Sitting, Cuff Size: Normal) Comment: Took her medication here  Pulse 79   Temp (!) 97.4 F (36.3 C) (Temporal)   Ht 5\' 4"  (1.626 m)   Wt 194 lb 3.2 oz (88.1 kg)   SpO2 97%   BMI 33.33 kg/m  Wt Readings from Last 3 Encounters:  07/16/19 194 lb 3.2 oz (88.1 kg)  03/25/19 194 lb (88 kg)  03/17/19 194 lb 3.2 oz (88.1 kg)     Health Maintenance Due  Topic Date Due  . OPHTHALMOLOGY EXAM  Never done  . COVID-19 Vaccine (1) Never done  . TETANUS/TDAP  Never done  . DEXA SCAN  Never done    There are no preventive care reminders to display for this patient.  Lab Results  Component Value Date   TSH 2.88 03/17/2019   Lab Results  Component Value Date   WBC 7.8 03/17/2019   HGB 13.8 03/17/2019   HCT 43.0 03/17/2019   MCV 86.4 03/17/2019   PLT 184.0 03/17/2019   Lab Results  Component Value Date   NA 137 03/17/2019   K 4.0 03/17/2019   CO2 28 03/17/2019   GLUCOSE 198 (H) 03/17/2019   BUN 11 03/17/2019   CREATININE 0.78 03/17/2019   BILITOT 0.3 03/17/2019   ALKPHOS 83 03/17/2019   AST 14 03/17/2019   ALT 13 03/17/2019   PROT 6.9 03/17/2019   ALBUMIN 4.1 03/17/2019   CALCIUM 9.5 03/17/2019   GFR 72.10 03/17/2019   Lab Results  Component Value Date  CHOL 199 02/10/2018   CHOL 196 02/10/2018   Lab Results  Component Value Date   HDL 51.90 02/10/2018   HDL 54 02/10/2018   Lab Results  Component Value Date   LDLCALC 112 (H) 02/10/2018   LDLCALC 113 (H) 02/10/2018   Lab Results  Component Value Date   TRIG 172.0 (H) 02/10/2018   TRIG 169 (H) 02/10/2018   Lab Results  Component Value Date   CHOLHDL 4 02/10/2018   CHOLHDL 3.6 02/10/2018   Lab Results  Component Value Date   HGBA1C 9.0 (H) 03/17/2019      Assessment & Plan:   Problem List Items Addressed This Visit      Cardiovascular and Mediastinum   Essential hypertension   Relevant Medications   lisinopril (ZESTRIL) 20 MG tablet     Other   Acute bilateral low back pain    Relevant Medications   OXYCONTIN 20 MG 12 hr tablet   oxyCODONE (OXYCONTIN) 20 mg 12 hr tablet   oxyCODONE (OXYCONTIN) 20 mg 12 hr tablet   pregabalin (LYRICA) 25 MG capsule   Opioid abuse, daily use ([facility]CC) - Primary   Relevant Orders   DRUG MONITORING, PANEL 8 WITH CONFIRMATION, URINE   Encounter for long-term (current) use of high-risk medication   Relevant Orders   DRUG MONITORING, PANEL 8 WITH CONFIRMATION, URINE   Insomnia due to medical condition      Meds ordered this encounter  Medications  . OXYCONTIN 20 MG 12 hr tablet    Sig: Take 1 tablet (20 mg total) by mouth every 12 (twelve) hours.    Dispense:  60 tablet    Refill:  0    June  . oxyCODONE (OXYCONTIN) 20 mg 12 hr tablet    Sig: Take 1 tablet (20 mg total) by mouth every 12 (twelve) hours.    Dispense:  60 tablet    Refill:  0    June  . oxyCODONE (OXYCONTIN) 20 mg 12 hr tablet    Sig: Take 1 tablet (20 mg total) by mouth every 12 (twelve) hours.    Dispense:  60 tablet    Refill:  0    August  . lisinopril (ZESTRIL) 20 MG tablet    Sig: Take 1 tablet (20 mg total) by mouth daily.    Dispense:  90 tablet    Refill:  0  . pregabalin (LYRICA) 25 MG capsule    Sig: Take 1 capsule (25 mg total) by mouth 2 (two) times daily.    Dispense:  180 capsule    Refill:  0    Follow-up: Return needs to return in the next month or so for fu of her other issues.Libby Maw, MD

## 2019-07-21 ENCOUNTER — Other Ambulatory Visit: Payer: Self-pay

## 2019-07-21 DIAGNOSIS — E118 Type 2 diabetes mellitus with unspecified complications: Secondary | ICD-10-CM

## 2019-07-21 MED ORDER — NITROGLYCERIN 0.4 MG SL SUBL: SUBLINGUAL_TABLET | SUBLINGUAL | 5 refills | Status: AC

## 2019-07-21 MED ORDER — GLIPIZIDE 10 MG PO TABS: ORAL_TABLET | ORAL | 3 refills | Status: DC

## 2019-07-21 MED ORDER — FLUTICASONE PROPIONATE 50 MCG/ACT NA SUSP: 2.0000 | Freq: Every day | NASAL | 11 refills | Status: DC

## 2019-07-21 MED ORDER — METFORMIN HCL 500 MG PO TABS: 1000.0000 mg | ORAL_TABLET | Freq: Two times a day (BID) | ORAL | 3 refills | Status: DC

## 2019-07-21 MED ORDER — METOPROLOL TARTRATE 50 MG PO TABS: ORAL_TABLET | ORAL | 5 refills | Status: DC

## 2019-07-21 MED ORDER — LOVASTATIN 40 MG PO TABS: 40.0000 mg | ORAL_TABLET | Freq: Every day | ORAL | 3 refills | Status: DC

## 2019-07-21 MED ORDER — ESOMEPRAZOLE MAGNESIUM 40 MG PO CPDR: DELAYED_RELEASE_CAPSULE | ORAL | 3 refills | Status: DC

## 2019-07-21 NOTE — Telephone Encounter (Signed)
Last OV 07/16/2019

## 2019-07-23 LAB — DRUG MONITORING, PANEL 8 WITH CONFIRMATION, URINE
6 Acetylmorphine: NEGATIVE ng/mL (ref <10)
Alcohol Metabolites: NEGATIVE ng/mL
Amphetamines: NEGATIVE ng/mL (ref <500)
Benzodiazepines: NEGATIVE ng/mL (ref <100)
Buprenorphine, Urine: NEGATIVE ng/mL (ref <5)
Cocaine Metabolite: NEGATIVE ng/mL (ref <150)
Creatinine: 206.3 mg/dL
Ethyl Glucuronide (ETG): NEGATIVE ng/mL (ref <500)
Ethyl Sulfate (ETS): NEGATIVE ng/mL (ref <100)
MDMA: NEGATIVE ng/mL (ref <500)
Marijuana Metabolite: NEGATIVE ng/mL (ref <20)
Oxidant: NEGATIVE ug/mL
pH: 8.9 (ref 4.5–9.0)

## 2019-07-23 LAB — DM TEMPLATE

## 2019-07-30 ENCOUNTER — Other Ambulatory Visit: Payer: Self-pay | Admitting: Family Medicine

## 2019-08-10 DIAGNOSIS — Z23 Encounter for immunization: Secondary | ICD-10-CM | POA: Diagnosis not present

## 2019-08-20 ENCOUNTER — Other Ambulatory Visit: Payer: Self-pay | Admitting: Family Medicine

## 2019-08-20 DIAGNOSIS — M545 Low back pain, unspecified: Secondary | ICD-10-CM

## 2019-08-24 ENCOUNTER — Other Ambulatory Visit: Payer: Self-pay | Admitting: Family Medicine

## 2019-08-26 ENCOUNTER — Other Ambulatory Visit: Payer: Self-pay

## 2019-08-27 ENCOUNTER — Ambulatory Visit (INDEPENDENT_AMBULATORY_CARE_PROVIDER_SITE_OTHER): Payer: Medicare Other | Admitting: Family Medicine

## 2019-08-27 ENCOUNTER — Encounter: Payer: Self-pay | Admitting: Family Medicine

## 2019-08-27 VITALS — BP 144/80 | HR 84 | Temp 99.6°F | Ht 64.0 in | Wt 192.0 lb

## 2019-08-27 DIAGNOSIS — I10 Essential (primary) hypertension: Secondary | ICD-10-CM | POA: Diagnosis not present

## 2019-08-27 DIAGNOSIS — Z9119 Patient's noncompliance with other medical treatment and regimen: Secondary | ICD-10-CM

## 2019-08-27 DIAGNOSIS — R4689 Other symptoms and signs involving appearance and behavior: Secondary | ICD-10-CM | POA: Diagnosis not present

## 2019-08-27 DIAGNOSIS — Z91199 Patient's noncompliance with other medical treatment and regimen due to unspecified reason: Secondary | ICD-10-CM

## 2019-08-27 NOTE — Progress Notes (Addendum)
Established Patient Office Visit  Subjective:  Patient ID: Mariah Reed, female    DOB: [birth year]  Age: 74 y.o. MRN: [id number]  CC: No chief complaint on file.   HPI Mariah Reed presents for follow-up of her blood pressure.  We had added lisinopril 20 mg daily to her standing hydralazine 10 mg 3 times daily and metoprolol 50 mg twice daily.  She decided that the lisinopril was not working and discontinued it.  She only takes the metoprolol as needed increased heart rate.  When I asked her why she stopped it she said she did it because she was it was not working.  Past Medical History:  Diagnosis Date  . Allergy   . COPD (chronic obstructive pulmonary disease) ([place])   . Diabetes mellitus   . Gout   . Hyperlipidemia   . Hypertension   . Low back pain   . Peripheral neuropathy     Past Surgical History:  Procedure Laterality Date  . ABDOMINAL HYSTERECTOMY    . CHOLECYSTECTOMY  unknown   Patient stated she does not remember when it was removed. (possibly 1997)  . POLYPECTOMY     Colon  . TUBAL LIGATION      Family History  Problem Relation Age of Onset  . Uterine cancer Mother     Social History   Socioeconomic History  . Marital status: Legally Separated    Spouse name: Coriann Brouhard  . Number of children: Not on file  . Years of education: Not on file  . Highest education level: Not on file  Occupational History    Employer: DISABLED  Tobacco Use  . Smoking status: Never Smoker  . Smokeless tobacco: Never Used  Substance and Sexual Activity  . Alcohol use: No  . Drug use: No  . Sexual activity: Not on file  Other Topics Concern  . Not on file  Social History Narrative   Pt would like [name] at [phone] (nephew) to be called in case of emergency and ask him to call her daughter [name] at [phone].   Social Determinants of Health   Financial Resource Strain:   . Difficulty of Paying Living Expenses:   Food Insecurity:     . Worried About Charity fundraiser in the Last Year:   . Arboriculturist in the Last Year:   Transportation Needs:   . Film/video editor (Medical):   Marland Kitchen Lack of Transportation (Non-Medical):   Physical Activity:   . Days of Exercise per Week:   . Minutes of Exercise per Session:   Stress:   . Feeling of Stress :   Social Connections:   . Frequency of Communication with Friends and Family:   . Frequency of Social Gatherings with Friends and Family:   . Attends Religious Services:   . Active Member of Clubs or Organizations:   . Attends Archivist Meetings:   Marland Kitchen Marital Status:   Intimate Partner Violence:   . Fear of Current or Ex-Partner:   . Emotionally Abused:   Marland Kitchen Physically Abused:   . Sexually Abused:     Outpatient Medications Prior to Visit  Medication Sig Dispense Refill  . ACCU-CHEK FASTCLIX LANCETS MISC 1 Device by Does not apply route 3 (three) times daily. Use to check blood sugar up to three times a day 102 each 12  . albuterol (VENTOLIN HFA) 108 (90 Base) MCG/ACT inhaler INHALE 2 PUFFS INTO THE LUNGS EVERY 4  HOURS AS NEEDED 18 g 11  . Cyanocobalamin (B-12 PO) Take by mouth.    . diclofenac sodium (VOLTAREN) 1 % GEL APPLY 2 GRAMS TOPICALLY 4 TIMES DAILY 100 g PRN  . esomeprazole (NEXIUM) 40 MG capsule TAKE 1 CAPSULE BY MOUTH ONCE DAILY 90 capsule 3  . fexofenadine (ALLEGRA) 180 MG tablet Take 180 mg by mouth as needed.      Marland Kitchen FLECTOR 1.3 % PTCH APPLY 1 PATCH TO THE SKIN AS DIRECTED UPTO 2 PER DAY 60 patch 5  . fluticasone (FLONASE) 50 MCG/ACT nasal spray Place 2 sprays into both nostrils daily. 16 g 11  . glipiZIDE (GLUCOTROL) 10 MG tablet Take 1bid (changed at OV on 7.21.2020 due to A1C @ 9.3) 90 tablet 3  . hydrALAZINE (APRESOLINE) 10 MG tablet TAKE 1 TABLET BY MOUTH 3 TIMES DAILY FORBLOOD PRESSURE 90 tablet 5  . hydrOXYzine (ATARAX/VISTARIL) 50 MG tablet Take 1 tablet (50 mg total) by mouth 3 (three) times daily as needed. 270 tablet 1  .  levothyroxine (SYNTHROID) 75 MCG tablet Take 1 tablet (75 mcg total) by mouth daily. 90 tablet 1  . lisinopril (ZESTRIL) 20 MG tablet Take 1 tablet (20 mg total) by mouth daily. 90 tablet 0  . lovastatin (MEVACOR) 40 MG tablet Take 1 tablet (40 mg total) by mouth at bedtime. 90 tablet 3  . metFORMIN (GLUCOPHAGE) 500 MG tablet Take 2 tablets (1,000 mg total) by mouth 2 (two) times daily with a meal. 360 tablet 3  . metoprolol tartrate (LOPRESSOR) 50 MG tablet TAKE 1 TABLET BY MOUTH TWICE A DAY AS NEEDED FOR INCREASED HEART RATE 60 tablet 5  . nitroGLYCERIN (NITROSTAT) 0.4 MG SL tablet Take SL prn chest pain/May repeat 5 min apart up to 3 times prn 25 tablet 5  . nystatin cream (MYCOSTATIN) APPLY TO AFFECTED AREAS ONCE DAILY AS NEEDED 30 g PRN  . oxyCODONE (OXYCONTIN) 20 mg 12 hr tablet Take 1 tablet (20 mg total) by mouth every 12 (twelve) hours. 60 tablet 0  . oxyCODONE (OXYCONTIN) 20 mg 12 hr tablet Take 1 tablet (20 mg total) by mouth every 12 (twelve) hours. 60 tablet 0  . OXYCONTIN 20 MG 12 hr tablet Take 1 tablet (20 mg total) by mouth every 12 (twelve) hours. 60 tablet 0  . PATADAY 0.2 % SOLN PLACE 1 DROP INTO BOTH EYES EVERY DAY 2.5 mL 2  . pregabalin (LYRICA) 25 MG capsule TAKE 1 CAPSULE BY MOUTH TWICE DAILY 180 capsule 1  . tiotropium (SPIRIVA HANDIHALER) 18 MCG inhalation capsule INHALE THE CONTENTS OF 1 CAPSUOLE VIA HANDIHALER ONCE DAILY 30 capsule 11  . topiramate (TOPAMAX) 100 MG tablet TAKE 3 TABLETS BY MOUTH ONCE DAILY 90 tablet 5  . VITAMIN D PO Take 1,000 mg by mouth.    . zolpidem (AMBIEN) 5 MG tablet TAKE 1 TABLET BY MOUTH AT BEDTIME AS NEEDED FOR SLEEP 90 tablet 0  . nystatin (MYCOSTATIN) 100000 UNIT/ML suspension TAKE 1 TEASPOONFUL BY MOUTH 4 TIMES DAILY AS DIRECTED 60 mL 6   No facility-administered medications prior to visit.    Allergies  Allergen Reactions  . Levofloxacin Hives    ROS Review of Systems  Constitutional: Negative.   Respiratory: Negative.     Cardiovascular: Negative.   Gastrointestinal: Negative.   Musculoskeletal: Positive for arthralgias and back pain.      Objective:    Physical Exam Vitals and nursing note reviewed.  Constitutional:      Appearance: Normal appearance.  HENT:     Head: Normocephalic and atraumatic.     Right Ear: External ear normal.     Left Ear: External ear normal.  Eyes:     General: No scleral icterus.       Right eye: No discharge.        Left eye: No discharge.     Conjunctiva/sclera: Conjunctivae normal.  Pulmonary:     Effort: Pulmonary effort is normal.     Breath sounds: Normal breath sounds.  Neurological:     Mental Status: She is alert. Mental status is at baseline.  Psychiatric:        Mood and Affect: Mood normal.        Behavior: Behavior normal.     BP (!) 144/80 (BP Location: Left Arm, Patient Position: Sitting, Cuff Size: Normal)   Pulse 84   Temp 99.6 F (37.6 C) (Oral)   Ht 5\' 4"  (1.626 m)   Wt 192 lb (87.1 kg)   SpO2 96%   BMI 32.96 kg/m  Wt Readings from Last 3 Encounters:  08/27/19 192 lb (87.1 kg)  07/16/19 194 lb 3.2 oz (88.1 kg)  03/25/19 194 lb (88 kg)     Health Maintenance Due  Topic Date Due  . OPHTHALMOLOGY EXAM  Never done  . TETANUS/TDAP  Never done  . DEXA SCAN  Never done    There are no preventive care reminders to display for this patient.  Lab Results  Component Value Date   TSH 2.88 03/17/2019   Lab Results  Component Value Date   WBC 7.8 03/17/2019   HGB 13.8 03/17/2019   HCT 43.0 03/17/2019   MCV 86.4 03/17/2019   PLT 184.0 03/17/2019   Lab Results  Component Value Date   NA 137 03/17/2019   K 4.0 03/17/2019   CO2 28 03/17/2019   GLUCOSE 198 (H) 03/17/2019   BUN 11 03/17/2019   CREATININE 0.78 03/17/2019   BILITOT 0.3 03/17/2019   ALKPHOS 83 03/17/2019   AST 14 03/17/2019   ALT 13 03/17/2019   PROT 6.9 03/17/2019   ALBUMIN 4.1 03/17/2019   CALCIUM 9.5 03/17/2019   GFR 72.10 03/17/2019   Lab Results   Component Value Date   CHOL 199 02/10/2018   CHOL 196 02/10/2018   Lab Results  Component Value Date   HDL 51.90 02/10/2018   HDL 54 02/10/2018   Lab Results  Component Value Date   LDLCALC 112 (H) 02/10/2018   LDLCALC 113 (H) 02/10/2018   Lab Results  Component Value Date   TRIG 172.0 (H) 02/10/2018   TRIG 169 (H) 02/10/2018   Lab Results  Component Value Date   CHOLHDL 4 02/10/2018   CHOLHDL 3.6 02/10/2018   Lab Results  Component Value Date   HGBA1C 9.0 (H) 03/17/2019      Assessment & Plan:   Problem List Items Addressed This Visit      Cardiovascular and Mediastinum   Essential hypertension - Primary     Other   Threatening behavior   Non-compliance      No orders of the defined types were placed in this encounter.   Follow-up: Return Patient will follow up with her new provider..  I confronted the patient about stopping the additional blood pressure medicine because she thought that it was not working.  She then said that she did not want to have a heart attack or stroke.  I could not make her understand that the entire reason for  adding a blood pressure medicine to lower her blood pressure was to prevent those 2 things.  I told her that this qualifies as noncompliance.  She then said that she would have her wealthy daughter tend to me. I have already approved her transfer of care to another provider. She will follow up there.   Libby Maw, MD

## 2019-09-23 ENCOUNTER — Telehealth: Payer: Self-pay | Admitting: Family Medicine

## 2019-09-23 ENCOUNTER — Other Ambulatory Visit: Payer: Self-pay

## 2019-09-23 DIAGNOSIS — G4701 Insomnia due to medical condition: Secondary | ICD-10-CM

## 2019-09-23 NOTE — Telephone Encounter (Signed)
Patient calling for refill on pending medications last OV 08/27/19 Patient also requesting refill on Flector patch. Please advise.

## 2019-09-23 NOTE — Telephone Encounter (Signed)
Patient needs refills and has a new pharmacy. Levothyroxine 75mg , Flector patch and zolpidem 5mg . Please send these and all future refills to patients new pharmacy: [facility] [place], phone [phone].

## 2019-09-24 MED ORDER — ZOLPIDEM TARTRATE 5 MG PO TABS: 5.0000 mg | ORAL_TABLET | Freq: Every evening | ORAL | 1 refills | Status: DC | PRN

## 2019-09-24 MED ORDER — LEVOTHYROXINE SODIUM 75 MCG PO TABS: 75.0000 ug | ORAL_TABLET | Freq: Every day | ORAL | 1 refills | Status: DC

## 2019-10-23 ENCOUNTER — Telehealth: Payer: Self-pay | Admitting: Family Medicine

## 2019-10-23 NOTE — Progress Notes (Signed)
  Chronic Care Management   Outreach Note  10/23/2019 Name: Mariah Reed MRN: [id number] DOB: 07-Jan-1946  Referred by: Libby Maw, MD Reason for referral : No chief complaint on file.   An unsuccessful telephone outreach was attempted today. The patient was referred to the pharmacist for assistance with care management and care coordination.   Follow Up Plan:   Carley Perdue [employer] Scheduler

## 2019-10-27 ENCOUNTER — Encounter: Payer: Self-pay | Admitting: Family Medicine

## 2019-10-27 ENCOUNTER — Ambulatory Visit (INDEPENDENT_AMBULATORY_CARE_PROVIDER_SITE_OTHER): Payer: Medicare Other | Admitting: Family Medicine

## 2019-10-27 ENCOUNTER — Other Ambulatory Visit: Payer: Self-pay

## 2019-10-27 VITALS — BP 164/96 | HR 75 | Temp 99.4°F | Wt 189.0 lb

## 2019-10-27 DIAGNOSIS — K529 Noninfective gastroenteritis and colitis, unspecified: Secondary | ICD-10-CM | POA: Insufficient documentation

## 2019-10-27 DIAGNOSIS — J301 Allergic rhinitis due to pollen: Secondary | ICD-10-CM

## 2019-10-27 DIAGNOSIS — I10 Essential (primary) hypertension: Secondary | ICD-10-CM | POA: Diagnosis not present

## 2019-10-27 DIAGNOSIS — E559 Vitamin D deficiency, unspecified: Secondary | ICD-10-CM | POA: Diagnosis not present

## 2019-10-27 DIAGNOSIS — J411 Mucopurulent chronic bronchitis: Secondary | ICD-10-CM

## 2019-10-27 DIAGNOSIS — Z23 Encounter for immunization: Secondary | ICD-10-CM | POA: Diagnosis not present

## 2019-10-27 DIAGNOSIS — G609 Hereditary and idiopathic neuropathy, unspecified: Secondary | ICD-10-CM

## 2019-10-27 DIAGNOSIS — K219 Gastro-esophageal reflux disease without esophagitis: Secondary | ICD-10-CM | POA: Diagnosis not present

## 2019-10-27 DIAGNOSIS — R519 Headache, unspecified: Secondary | ICD-10-CM

## 2019-10-27 DIAGNOSIS — E1159 Type 2 diabetes mellitus with other circulatory complications: Secondary | ICD-10-CM

## 2019-10-27 DIAGNOSIS — E039 Hypothyroidism, unspecified: Secondary | ICD-10-CM

## 2019-10-27 DIAGNOSIS — Z9119 Patient's noncompliance with other medical treatment and regimen: Secondary | ICD-10-CM

## 2019-10-27 DIAGNOSIS — M545 Low back pain, unspecified: Secondary | ICD-10-CM

## 2019-10-27 DIAGNOSIS — G8929 Other chronic pain: Secondary | ICD-10-CM

## 2019-10-27 DIAGNOSIS — E1169 Type 2 diabetes mellitus with other specified complication: Secondary | ICD-10-CM

## 2019-10-27 DIAGNOSIS — M8949 Other hypertrophic osteoarthropathy, multiple sites: Secondary | ICD-10-CM

## 2019-10-27 DIAGNOSIS — M159 Polyosteoarthritis, unspecified: Secondary | ICD-10-CM

## 2019-10-27 DIAGNOSIS — I252 Old myocardial infarction: Secondary | ICD-10-CM | POA: Diagnosis not present

## 2019-10-27 DIAGNOSIS — R4689 Other symptoms and signs involving appearance and behavior: Secondary | ICD-10-CM

## 2019-10-27 DIAGNOSIS — E785 Hyperlipidemia, unspecified: Secondary | ICD-10-CM

## 2019-10-27 DIAGNOSIS — Z91199 Patient's noncompliance with other medical treatment and regimen due to unspecified reason: Secondary | ICD-10-CM

## 2019-10-27 DIAGNOSIS — G4701 Insomnia due to medical condition: Secondary | ICD-10-CM

## 2019-10-27 DIAGNOSIS — R197 Diarrhea, unspecified: Secondary | ICD-10-CM

## 2019-10-27 MED ORDER — LOVASTATIN 40 MG PO TABS
40.0000 mg | ORAL_TABLET | Freq: Every day | ORAL | 3 refills | Status: DC
Start: 2019-10-27 — End: 2021-01-29

## 2019-10-27 MED ORDER — PREGABALIN 25 MG PO CAPS: 25.0000 mg | ORAL_CAPSULE | Freq: Two times a day (BID) | ORAL | 1 refills | Status: DC

## 2019-10-27 MED ORDER — SPIRIVA HANDIHALER 18 MCG IN CAPS
ORAL_CAPSULE | RESPIRATORY_TRACT | 11 refills | Status: DC
Start: 2019-10-27 — End: 2022-03-08

## 2019-10-27 MED ORDER — GLIPIZIDE 10 MG PO TABS
10.0000 mg | ORAL_TABLET | Freq: Two times a day (BID) | ORAL | 3 refills | Status: DC
Start: 2019-10-27 — End: 2022-03-08

## 2019-10-27 MED ORDER — LISINOPRIL 20 MG PO TABS: 20.0000 mg | ORAL_TABLET | Freq: Every day | ORAL | 0 refills | Status: DC

## 2019-10-27 MED ORDER — TOPIRAMATE 100 MG PO TABS
300.0000 mg | ORAL_TABLET | Freq: Every day | ORAL | 5 refills | Status: DC
Start: 2019-10-27 — End: 2021-12-08

## 2019-10-27 MED ORDER — LEVOTHYROXINE SODIUM 75 MCG PO TABS
75.0000 ug | ORAL_TABLET | Freq: Every day | ORAL | 1 refills | Status: DC
Start: 2019-10-27 — End: 2022-03-13

## 2019-10-27 MED ORDER — HYDRALAZINE HCL 10 MG PO TABS
10.0000 mg | ORAL_TABLET | Freq: Three times a day (TID) | ORAL | 5 refills | Status: DC
Start: 2019-10-27 — End: 2019-11-26

## 2019-10-27 NOTE — Assessment & Plan Note (Addendum)
Longstanding issue Has been on Ambien for many decades per patient report As above, with patient becoming angry and argumentative and threatening over her opioids, this controlled substance was not prescribed today either Did offer trazodone and patient declined

## 2019-10-27 NOTE — Assessment & Plan Note (Signed)
Refilled Spiriva Chronic and stable Denies any recent exacerbations Continue Mucinex as needed Encouraged ongoing tobacco cessation

## 2019-10-27 NOTE — Assessment & Plan Note (Signed)
As above, discussed the importance of taking medications as prescribed and not switching her doses independently based on single readings of blood pressure blood sugar May benefit from CCM services

## 2019-10-27 NOTE — Assessment & Plan Note (Signed)
Reviewed last lipid panel Goal LDL less than 70 in the setting of diabetes and history of MI Continue current statin, but may benefit from higher potency statin Recheck FLP and CMP

## 2019-10-27 NOTE — Assessment & Plan Note (Signed)
Longstanding issue Suspect Metformin is contributing Given the watery and explosive nature in addition to the incontinence, concern for possible infectious causes as well We will stop Metformin as above GI pathogen panel and C. difficile PCR ordered today

## 2019-10-27 NOTE — Assessment & Plan Note (Signed)
As above, when discussing pain management and opioid therapy, patient becomes argumentative and threatening She states " my daughter will deal with you" and "you better look out" She also raised her voice Interestingly, her demeanor changes after I left the room and my CMA reentered the room to give her flu shot acting as if nothing had happened It is documented in her chart from last PCP that she exhibited threatening behavior there as well She spends a long time during the visit today listing her concerns and disdain for her previous PCP office Due to the threatening behavior, which is a repeat occurrence, patient will be dismissed from our practice Letter will be sent to patient detailing this and the guidelines around providing care for the next 30 days while she has time to find a new provider I did not tell the patient in person that she is being dismissed as I did not want her to cause a larger seen or threaten anyone else on her way out of the office today

## 2019-10-27 NOTE — Assessment & Plan Note (Addendum)
Previously uncontrolled with hyperglycemia Again discussed the importance of not changing her medications without provider input I suspect that her diarrhea is related to her Metformin use as it worsened when her dose was increased It has not resolved with decreasing her dose on her own of Metformin, so we will discontinue Metformin See further work-up for diarrhea as below Need to update screenings and vaccines at next visit Recheck A1c On statin Resuming ACE inhibitor as above ROI sent for eye exam Cautioned about max dose of glipizide and risk for hypoglycemia Discussed that doses over 20 mg cumulatively per day of glipizide risk hypoglycemia and do not improve glycemic control Decrease glipizide to 10 mg twice daily Pending A1c, consider addition of SGLT2 versus GLP-1

## 2019-10-27 NOTE — Assessment & Plan Note (Signed)
Chronic low back pain She has tried baclofen previously She is on low-dose Lyrica, which will be continued She reports she is only had about 1 pill of her OxyContin in the last 4 days Her last refill was 6 weeks ago for a 30-day supply We discussed the risks of chronic opioids and that there are often alternatives including interventions that may help with chronic pain and carry less risk than chronic opioids Offered referral to pain management, and patient became angry and argumentative She states that no one will help her a pain management, that she has been told that she does not need opioids and she is not interested in that opinion When I mentioned that having an expert opinion on her pain management and looking into alternative ways to treat her pain would be a good idea, she became angry and threatened me that " my daughter will deal with you" and " you better look out" No opioid prescription was prescribed today Feels though patient would benefit from pain management and pain psychology, especially given the red flags I witnessed today

## 2019-10-27 NOTE — Assessment & Plan Note (Signed)
Continue Lyrica 25 mg twice daily Feels the 50 mg dose was too much for her

## 2019-10-27 NOTE — Assessment & Plan Note (Signed)
Chronic with uncontrolled chronic pain Multiple joints involved, but primary focus is very low back as below Can continue Flector patches Cannot tolerate oral NSAIDs See discussion below regarding her chronic pain

## 2019-10-27 NOTE — Assessment & Plan Note (Signed)
Discussed importance of good blood pressure and cholesterol control as well as other risk factors including maintaining smoking cessation
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
## 2019-10-27 NOTE — Assessment & Plan Note (Signed)
Chronic and fairly well controlled Continue PPI Discussed lifestyle dietary changes

## 2019-10-27 NOTE — Assessment & Plan Note (Signed)
Chronic and stable Continue Allegra, Flonase Continue hydroxyzine as needed for itching/swelling

## 2019-10-27 NOTE — Assessment & Plan Note (Signed)
Continue OTC supplement Recheck level

## 2019-10-27 NOTE — Assessment & Plan Note (Signed)
Elevated today Patient not taking her medications as prescribed Discussed the importance of taking medications regularly and not changing doses regularly without provider input Will continue hydralazine 10mg  TID Add lisinopril 20mg  daily back to regimen Recheck metabolic panel

## 2019-10-27 NOTE — Assessment & Plan Note (Signed)
Previously well controlled Continue Synthroid at current dose  Recheck TSH and adjust Synthroid as indicated   

## 2019-10-27 NOTE — Assessment & Plan Note (Signed)
Longstanding and fairly well controlled Continue Topamax at current dose Could consider dose titration in the future

## 2019-10-28 ENCOUNTER — Other Ambulatory Visit: Payer: Self-pay | Admitting: Family Medicine

## 2019-10-28 ENCOUNTER — Telehealth: Payer: Self-pay | Admitting: Family Medicine

## 2019-10-28 DIAGNOSIS — R197 Diarrhea, unspecified: Secondary | ICD-10-CM | POA: Diagnosis not present

## 2019-10-28 LAB — COMPREHENSIVE METABOLIC PANEL
ALT: 21 IU/L (ref 0–32)
AST: 27 IU/L (ref 0–40)
Albumin/Globulin Ratio: 1.9 (ref 1.2–2.2)
Albumin: 4.5 g/dL (ref 3.7–4.7)
Alkaline Phosphatase: 120 IU/L (ref 44–121)
BUN/Creatinine Ratio: 8 — ABNORMAL LOW (ref 12–28)
BUN: 7 mg/dL — ABNORMAL LOW (ref 8–27)
Bilirubin Total: 0.3 mg/dL (ref 0.0–1.2)
CO2: 22 mmol/L (ref 20–29)
Calcium: 9.3 mg/dL (ref 8.7–10.3)
Chloride: 103 mmol/L (ref 96–106)
Creatinine, Ser: 0.85 mg/dL (ref 0.57–1.00)
GFR calc Af Amer: 78 mL/min/{1.73_m2} (ref >59)
GFR calc non Af Amer: 67 mL/min/{1.73_m2} (ref >59)
Globulin, Total: 2.4 g/dL (ref 1.5–4.5)
Glucose: 169 mg/dL — ABNORMAL HIGH (ref 65–99)
Potassium: 4.2 mmol/L (ref 3.5–5.2)
Sodium: 139 mmol/L (ref 134–144)
Total Protein: 6.9 g/dL (ref 6.0–8.5)

## 2019-10-28 LAB — LIPID PANEL
Chol/HDL Ratio: 3.8 ratio (ref 0.0–4.4)
Cholesterol, Total: 170 mg/dL (ref 100–199)
HDL: 45 mg/dL (ref >39)
LDL Chol Calc (NIH): 95 mg/dL (ref 0–99)
Triglycerides: 174 mg/dL — ABNORMAL HIGH (ref 0–149)
VLDL Cholesterol Cal: 30 mg/dL (ref 5–40)

## 2019-10-28 LAB — HEMOGLOBIN A1C
Est. average glucose Bld gHb Est-mCnc: 235 mg/dL
Hgb A1c MFr Bld: 9.8 % — ABNORMAL HIGH (ref 4.8–5.6)

## 2019-10-28 LAB — TSH: TSH: 4.5 u[IU]/mL (ref 0.450–4.500)

## 2019-10-28 LAB — VITAMIN D 25 HYDROXY (VIT D DEFICIENCY, FRACTURES): Vit D, 25-Hydroxy: 25 ng/mL — ABNORMAL LOW (ref 30.0–100.0)

## 2019-10-28 LAB — VITAMIN B12: Vitamin B-12: 468 pg/mL (ref 232–1245)

## 2019-10-28 NOTE — Telephone Encounter (Signed)
Patient came in the office to bring a specimen to the lab and states that you forgot to send in a new medication for sleep since "you took her Ambien away" and wanted me to send you a message.  I looked in her chart and saw that it says she declined it.  I told the patient that it was not sent in because she declined it and she states that you just misunderstood her and that she did not decline the medication.  She would like to try trazodone.  She uses [place].

## 2019-10-29 NOTE — Telephone Encounter (Signed)
Ok to send in Trazodone 50mg  qhs prn #30 r0.  Do not take with hydroxyzine, ambien, or other sedating medicaitons.

## 2019-10-30 NOTE — Telephone Encounter (Signed)
-----   Message from Virginia Crews, MD sent at 10/28/2019  1:33 PM EDT ----- Normal labs, except high cholesterol, a1c, and low vitamin D. Recommend OTC vit D3 1000-2000 units daily. Continue glucose 10 mg BID and add Jardiance 10mg  daily. Stop Metformin as discussed to help with diarrhea. Need to ensure taking station regularly. May need to consider higher intensity station in the future. Do not want to make too many changes at once.

## 2019-10-30 NOTE — Telephone Encounter (Signed)
LMTCB 10/30/2019.  PEC please advise pt as below.   Thanks,   -Mickel Baas

## 2019-10-31 LAB — SPECIMEN STATUS REPORT

## 2019-11-02 LAB — CLOSTRIDIUM DIFFICILE BY PCR

## 2019-11-02 LAB — STOOL CULTURE: E coli, Shiga toxin Assay: NEGATIVE

## 2019-11-03 ENCOUNTER — Telehealth: Payer: Self-pay | Admitting: Family Medicine

## 2019-11-03 MED ORDER — EMPAGLIFLOZIN 10 MG PO TABS: 10.0000 mg | ORAL_TABLET | Freq: Every day | ORAL | 0 refills | Status: DC

## 2019-11-03 NOTE — Telephone Encounter (Signed)
-----   Message from Virginia Crews, MD sent at 11/02/2019  2:27 PM EDT ----- Normal stool studies

## 2019-11-03 NOTE — Telephone Encounter (Signed)
Pt advised.  Jardiance sent to [facility]   Thanks,   -Mickel Baas

## 2019-11-03 NOTE — Telephone Encounter (Signed)
Patient called and was transferred to office for clarification of lab note by Dr Brita Romp.

## 2019-11-05 ENCOUNTER — Telehealth: Payer: Self-pay

## 2019-11-05 NOTE — Telephone Encounter (Signed)
I just wanted to make sure; she needs to take Glipizide 10mg  BID?  Thanks,   -Mickel Baas

## 2019-11-05 NOTE — Telephone Encounter (Signed)
Yes. Glipizide 10mg  BID, not 20mg  BID

## 2019-11-05 NOTE — Telephone Encounter (Signed)
Copied from [place] [id number]. Topic: General - Other >> Nov 05, 2019  1:03 PM Hinda Lenis D wrote: PT asking if she need to keep taking this medication glipiZIDE (GLUCOTROL) 10 MG tablet [[id number]] / please advise

## 2019-11-06 NOTE — Telephone Encounter (Signed)
Pt advised.   Thanks,   -Adley Mazurowski  

## 2019-11-09 ENCOUNTER — Telehealth: Payer: Self-pay

## 2019-11-09 NOTE — Telephone Encounter (Signed)
Copied from [place] [id number]. Topic: General - Inquiry >> Nov 09, 2019  1:37 PM Scherrie Gerlach wrote: Reason for CRM:  pt wants to apologize to Dr B if she said anything wrong or out of the way.  She is sorry if she offended her, she did not mean to.  She apologizes for her actions if she said or did anything offensive. Pt states she is under a lot of stress with her health issues.

## 2019-11-13 ENCOUNTER — Telehealth: Payer: Self-pay | Admitting: Family Medicine

## 2019-11-13 NOTE — Progress Notes (Signed)
  Chronic Care Management   Note  11/13/2019 Name: STORMY CONNON MRN: [id number] DOB: January 11, 1946  DENESHIA ZUCKER is a 74 y.o. year old female who is a primary care patient of Brita Romp, Dionne Bucy, MD. I reached out to Essie Christine by phone today in response to a referral sent by Ms. Elnita Maxwell Caravello's PCP, Brita Romp, Dionne Bucy, MD.   Ms. Economou was given information about Chronic Care Management services today including:  1. CCM service includes personalized support from designated clinical staff supervised by her physician, including individualized plan of care and coordination with other care providers 2. 24/7 contact phone numbers for assistance for urgent and routine care needs. 3. Service will only be billed when office clinical staff spend 20 minutes or more in a month to coordinate care. 4. Only one practitioner may furnish and bill the service in a calendar month. 5. The patient may stop CCM services at any time (effective at the end of the month) by phone call to the office staff.   Patient wishes to consider information provided and/or speak with a member of the care team before deciding about enrollment in care management services.   Follow up plan:   Carley Perdue [employer] Scheduler

## 2019-11-16 NOTE — Telephone Encounter (Signed)
Noted and appreciate the apology.

## 2019-11-19 ENCOUNTER — Telehealth: Payer: Self-pay | Admitting: Family Medicine

## 2019-11-19 DIAGNOSIS — M545 Low back pain, unspecified: Secondary | ICD-10-CM

## 2019-11-19 DIAGNOSIS — G4701 Insomnia due to medical condition: Secondary | ICD-10-CM

## 2019-11-19 NOTE — Telephone Encounter (Signed)
Per initial encounter, "[place] [facility] Merrily Pew is calling and the pt needs new rx hydralazine 10 mg #90 pt takes med 3 times a day, flector 1.3% patches # 60. Pt does 2 patches a day, generic lyrica 50 mg #90 and generic ambien 5 mg #30. Pt is moving her rxs back to [place]"; will route to office for final disposition; the pt is seen by Dr Brita Romp, [facility].

## 2019-11-19 NOTE — Telephone Encounter (Signed)
[place] pharm josh is calling and the pt needs new rx hydralazine 10 mg #90 pt takes med 3 times a day, flector 1.3% patches # 60. Pt does 2 patches a day, generic lyrica 50 mg #90 and generic ambien 5 mg #30. Pt is moving her rxs back to [place]

## 2019-11-24 NOTE — Telephone Encounter (Signed)
Patient has been dismissed from this practice - see letter dated 9/21.  We can refill one month supply of each of these medications, but she needs to find a new PCP within that time to get further refills.  Let me know if she wants 1 month supply sent in.

## 2019-11-25 NOTE — Addendum Note (Signed)
Addended by: Ashley Royalty E on: 11/25/2019 11:37 AM   Modules accepted: Orders

## 2019-11-25 NOTE — Telephone Encounter (Signed)
Pt advised.  She would like a one month supply sent to [facility].  Her new PCP will take over once she establishes care with them.  Thanks,    -Mickel Baas

## 2019-11-26 ENCOUNTER — Other Ambulatory Visit: Payer: Self-pay | Admitting: Family Medicine

## 2019-11-26 MED ORDER — DICLOFENAC EPOLAMINE 1.3 % EX PTCH: 1.0000 | MEDICATED_PATCH | Freq: Two times a day (BID) | CUTANEOUS | 0 refills | Status: DC

## 2019-11-26 MED ORDER — PREGABALIN 25 MG PO CAPS: 25.0000 mg | ORAL_CAPSULE | Freq: Two times a day (BID) | ORAL | 0 refills | Status: AC

## 2019-11-26 MED ORDER — HYDRALAZINE HCL 10 MG PO TABS
10.0000 mg | ORAL_TABLET | Freq: Three times a day (TID) | ORAL | 0 refills | Status: DC
Start: 2019-11-26 — End: 2021-01-20

## 2019-11-26 MED ORDER — ZOLPIDEM TARTRATE 5 MG PO TABS: 5.0000 mg | ORAL_TABLET | Freq: Every evening | ORAL | 0 refills | Status: AC | PRN

## 2019-11-26 NOTE — Telephone Encounter (Signed)
Please review. Patient has been dismissed from this practice.

## 2019-11-26 NOTE — Telephone Encounter (Signed)
   Notes to clinic:  review for refill Looks like patient maybe establishing with a new provider    Requested Prescriptions  Pending Prescriptions Disp Refills   esomeprazole (NEXIUM) 40 MG capsule [Pharmacy Med Name: ESOMEPRAZOLE MAGNESIUM 40 MG CAP] 90 capsule 3    Sig: TAKE 1 CAPSULE BY MOUTH ONCE DAILY      There is no refill protocol information for this order

## 2019-11-26 NOTE — Addendum Note (Signed)
Addended by: Virginia Crews on: 11/26/2019 08:08 AM   Modules accepted: Orders

## 2019-11-30 DIAGNOSIS — I10 Essential (primary) hypertension: Secondary | ICD-10-CM | POA: Diagnosis not present

## 2019-11-30 DIAGNOSIS — E119 Type 2 diabetes mellitus without complications: Secondary | ICD-10-CM | POA: Diagnosis not present

## 2019-11-30 DIAGNOSIS — M5010 Cervical disc disorder with radiculopathy, unspecified cervical region: Secondary | ICD-10-CM | POA: Diagnosis not present

## 2019-11-30 DIAGNOSIS — E118 Type 2 diabetes mellitus with unspecified complications: Secondary | ICD-10-CM | POA: Diagnosis not present

## 2019-11-30 DIAGNOSIS — Z794 Long term (current) use of insulin: Secondary | ICD-10-CM | POA: Diagnosis not present

## 2019-11-30 DIAGNOSIS — E039 Hypothyroidism, unspecified: Secondary | ICD-10-CM | POA: Diagnosis not present

## 2020-02-09 ENCOUNTER — Other Ambulatory Visit: Payer: Self-pay | Admitting: Family Medicine

## 2020-02-12 DIAGNOSIS — Z23 Encounter for immunization: Secondary | ICD-10-CM | POA: Diagnosis not present

## 2020-03-01 DIAGNOSIS — M5010 Cervical disc disorder with radiculopathy, unspecified cervical region: Secondary | ICD-10-CM | POA: Diagnosis not present

## 2020-03-01 DIAGNOSIS — E039 Hypothyroidism, unspecified: Secondary | ICD-10-CM | POA: Diagnosis not present

## 2020-03-01 DIAGNOSIS — E1140 Type 2 diabetes mellitus with diabetic neuropathy, unspecified: Secondary | ICD-10-CM | POA: Diagnosis not present

## 2020-03-01 DIAGNOSIS — E119 Type 2 diabetes mellitus without complications: Secondary | ICD-10-CM | POA: Diagnosis not present

## 2020-03-01 DIAGNOSIS — M8588 Other specified disorders of bone density and structure, other site: Secondary | ICD-10-CM | POA: Diagnosis not present

## 2020-03-01 DIAGNOSIS — Z780 Asymptomatic menopausal state: Secondary | ICD-10-CM | POA: Diagnosis not present

## 2020-03-01 DIAGNOSIS — Z794 Long term (current) use of insulin: Secondary | ICD-10-CM | POA: Diagnosis not present

## 2020-04-08 ENCOUNTER — Other Ambulatory Visit: Payer: Self-pay | Admitting: Family Medicine

## 2020-04-08 NOTE — Telephone Encounter (Signed)
Please advise on refill request

## 2020-05-09 ENCOUNTER — Other Ambulatory Visit: Payer: Self-pay | Admitting: Family Medicine

## 2020-05-12 DIAGNOSIS — R197 Diarrhea, unspecified: Secondary | ICD-10-CM | POA: Diagnosis not present

## 2020-05-17 DIAGNOSIS — R197 Diarrhea, unspecified: Secondary | ICD-10-CM | POA: Diagnosis not present

## 2020-05-17 DIAGNOSIS — Z23 Encounter for immunization: Secondary | ICD-10-CM | POA: Diagnosis not present

## 2020-05-31 ENCOUNTER — Other Ambulatory Visit: Payer: Self-pay | Admitting: Internal Medicine

## 2020-05-31 DIAGNOSIS — K591 Functional diarrhea: Secondary | ICD-10-CM | POA: Diagnosis not present

## 2020-05-31 DIAGNOSIS — G4701 Insomnia due to medical condition: Secondary | ICD-10-CM | POA: Diagnosis not present

## 2020-05-31 DIAGNOSIS — M7989 Other specified soft tissue disorders: Secondary | ICD-10-CM

## 2020-05-31 DIAGNOSIS — E039 Hypothyroidism, unspecified: Secondary | ICD-10-CM | POA: Diagnosis not present

## 2020-05-31 DIAGNOSIS — Z794 Long term (current) use of insulin: Secondary | ICD-10-CM | POA: Diagnosis not present

## 2020-05-31 DIAGNOSIS — R202 Paresthesia of skin: Secondary | ICD-10-CM | POA: Diagnosis not present

## 2020-05-31 DIAGNOSIS — E119 Type 2 diabetes mellitus without complications: Secondary | ICD-10-CM | POA: Diagnosis not present

## 2020-06-01 DIAGNOSIS — K591 Functional diarrhea: Secondary | ICD-10-CM | POA: Diagnosis not present

## 2020-06-01 DIAGNOSIS — R1084 Generalized abdominal pain: Secondary | ICD-10-CM | POA: Diagnosis not present

## 2020-06-07 ENCOUNTER — Other Ambulatory Visit: Payer: Self-pay | Admitting: Family Medicine

## 2020-06-20 DIAGNOSIS — E118 Type 2 diabetes mellitus with unspecified complications: Secondary | ICD-10-CM | POA: Diagnosis not present

## 2020-06-20 DIAGNOSIS — R194 Change in bowel habit: Secondary | ICD-10-CM | POA: Diagnosis not present

## 2020-06-20 DIAGNOSIS — Z9049 Acquired absence of other specified parts of digestive tract: Secondary | ICD-10-CM | POA: Diagnosis not present

## 2020-06-20 DIAGNOSIS — Z8719 Personal history of other diseases of the digestive system: Secondary | ICD-10-CM | POA: Diagnosis not present

## 2020-06-20 DIAGNOSIS — R197 Diarrhea, unspecified: Secondary | ICD-10-CM | POA: Diagnosis not present

## 2020-06-21 ENCOUNTER — Ambulatory Visit: Payer: Medicare Other

## 2020-06-27 DIAGNOSIS — Z8719 Personal history of other diseases of the digestive system: Secondary | ICD-10-CM | POA: Diagnosis not present

## 2020-06-27 DIAGNOSIS — R194 Change in bowel habit: Secondary | ICD-10-CM | POA: Diagnosis not present

## 2020-06-27 DIAGNOSIS — R197 Diarrhea, unspecified: Secondary | ICD-10-CM | POA: Diagnosis not present

## 2020-06-30 ENCOUNTER — Other Ambulatory Visit: Payer: Self-pay

## 2020-07-06 ENCOUNTER — Other Ambulatory Visit: Payer: Self-pay | Admitting: Gastroenterology

## 2020-07-06 ENCOUNTER — Other Ambulatory Visit (HOSPITAL_COMMUNITY): Payer: Self-pay | Admitting: Gastroenterology

## 2020-07-06 DIAGNOSIS — R197 Diarrhea, unspecified: Secondary | ICD-10-CM

## 2020-07-06 DIAGNOSIS — R194 Change in bowel habit: Secondary | ICD-10-CM

## 2020-07-06 DIAGNOSIS — Z8719 Personal history of other diseases of the digestive system: Secondary | ICD-10-CM

## 2020-07-07 ENCOUNTER — Other Ambulatory Visit: Payer: Self-pay | Admitting: Gastroenterology

## 2020-07-07 DIAGNOSIS — R197 Diarrhea, unspecified: Secondary | ICD-10-CM

## 2020-07-07 DIAGNOSIS — R194 Change in bowel habit: Secondary | ICD-10-CM

## 2020-07-07 DIAGNOSIS — Z8719 Personal history of other diseases of the digestive system: Secondary | ICD-10-CM

## 2020-07-07 DIAGNOSIS — K8689 Other specified diseases of pancreas: Secondary | ICD-10-CM

## 2020-07-13 DIAGNOSIS — R600 Localized edema: Secondary | ICD-10-CM | POA: Diagnosis not present

## 2020-07-28 ENCOUNTER — Ambulatory Visit
Admission: RE | Admit: 2020-07-28 | Discharge: 2020-07-28 | Disposition: A | Discharge status: Home / Self Care | Payer: Medicare Other | Source: Ambulatory Visit | Attending: Gastroenterology | Admitting: Gastroenterology

## 2020-07-28 ENCOUNTER — Other Ambulatory Visit: Payer: Self-pay

## 2020-07-28 DIAGNOSIS — R194 Change in bowel habit: Secondary | ICD-10-CM | POA: Insufficient documentation

## 2020-07-28 DIAGNOSIS — K8689 Other specified diseases of pancreas: Secondary | ICD-10-CM | POA: Diagnosis not present

## 2020-07-28 DIAGNOSIS — N200 Calculus of kidney: Secondary | ICD-10-CM | POA: Diagnosis not present

## 2020-07-28 DIAGNOSIS — Z8719 Personal history of other diseases of the digestive system: Secondary | ICD-10-CM | POA: Insufficient documentation

## 2020-07-28 DIAGNOSIS — R197 Diarrhea, unspecified: Secondary | ICD-10-CM | POA: Insufficient documentation

## 2020-07-28 DIAGNOSIS — I700 Atherosclerosis of aorta: Secondary | ICD-10-CM | POA: Diagnosis not present

## 2020-08-04 ENCOUNTER — Other Ambulatory Visit: Payer: Self-pay | Admitting: Family Medicine

## 2020-08-16 DIAGNOSIS — L309 Dermatitis, unspecified: Secondary | ICD-10-CM | POA: Diagnosis not present

## 2020-08-16 DIAGNOSIS — Z794 Long term (current) use of insulin: Secondary | ICD-10-CM | POA: Diagnosis not present

## 2020-08-16 DIAGNOSIS — K529 Noninfective gastroenteritis and colitis, unspecified: Secondary | ICD-10-CM | POA: Diagnosis not present

## 2020-08-16 DIAGNOSIS — I10 Essential (primary) hypertension: Secondary | ICD-10-CM | POA: Diagnosis not present

## 2020-08-16 DIAGNOSIS — E1140 Type 2 diabetes mellitus with diabetic neuropathy, unspecified: Secondary | ICD-10-CM | POA: Diagnosis not present

## 2020-09-02 DIAGNOSIS — R300 Dysuria: Secondary | ICD-10-CM | POA: Diagnosis not present

## 2020-09-02 DIAGNOSIS — Z794 Long term (current) use of insulin: Secondary | ICD-10-CM | POA: Diagnosis not present

## 2020-09-02 DIAGNOSIS — M5442 Lumbago with sciatica, left side: Secondary | ICD-10-CM | POA: Diagnosis not present

## 2020-09-02 DIAGNOSIS — I10 Essential (primary) hypertension: Secondary | ICD-10-CM | POA: Diagnosis not present

## 2020-09-02 DIAGNOSIS — M7989 Other specified soft tissue disorders: Secondary | ICD-10-CM | POA: Diagnosis not present

## 2020-09-02 DIAGNOSIS — E119 Type 2 diabetes mellitus without complications: Secondary | ICD-10-CM | POA: Diagnosis not present

## 2020-09-05 ENCOUNTER — Other Ambulatory Visit: Payer: Self-pay | Admitting: Internal Medicine

## 2020-09-05 DIAGNOSIS — M7989 Other specified soft tissue disorders: Secondary | ICD-10-CM

## 2020-11-16 DIAGNOSIS — E119 Type 2 diabetes mellitus without complications: Secondary | ICD-10-CM | POA: Diagnosis not present

## 2020-11-16 DIAGNOSIS — Z23 Encounter for immunization: Secondary | ICD-10-CM | POA: Diagnosis not present

## 2020-11-16 DIAGNOSIS — Z780 Asymptomatic menopausal state: Secondary | ICD-10-CM | POA: Diagnosis not present

## 2020-11-16 DIAGNOSIS — I10 Essential (primary) hypertension: Secondary | ICD-10-CM | POA: Diagnosis not present

## 2021-01-20 ENCOUNTER — Other Ambulatory Visit: Payer: Self-pay

## 2021-01-20 ENCOUNTER — Encounter (HOSPITAL_COMMUNITY): Payer: Self-pay | Admitting: Internal Medicine

## 2021-01-20 ENCOUNTER — Emergency Department (HOSPITAL_COMMUNITY): Payer: Medicare Other

## 2021-01-20 ENCOUNTER — Inpatient Hospital Stay (HOSPITAL_COMMUNITY)
Admission: EM | Admit: 2021-01-20 | Discharge: 2021-01-29 | DRG: 853 | Disposition: A | Discharge status: Home / Self Care | Payer: Medicare Other | Attending: Internal Medicine | Admitting: Internal Medicine

## 2021-01-20 ENCOUNTER — Inpatient Hospital Stay (HOSPITAL_COMMUNITY): Payer: Medicare Other | Admitting: Certified Registered Nurse Anesthetist

## 2021-01-20 ENCOUNTER — Encounter (HOSPITAL_COMMUNITY)
Admission: EM | Disposition: A | Discharge status: Home / Self Care | Payer: Self-pay | Source: Home / Self Care | Attending: Internal Medicine

## 2021-01-20 ENCOUNTER — Inpatient Hospital Stay (HOSPITAL_COMMUNITY): Payer: Medicare Other

## 2021-01-20 DIAGNOSIS — I5021 Acute systolic (congestive) heart failure: Secondary | ICD-10-CM | POA: Diagnosis not present

## 2021-01-20 DIAGNOSIS — N179 Acute kidney failure, unspecified: Secondary | ICD-10-CM | POA: Diagnosis present

## 2021-01-20 DIAGNOSIS — E872 Acidosis, unspecified: Secondary | ICD-10-CM | POA: Diagnosis present

## 2021-01-20 DIAGNOSIS — E039 Hypothyroidism, unspecified: Secondary | ICD-10-CM | POA: Diagnosis present

## 2021-01-20 DIAGNOSIS — K219 Gastro-esophageal reflux disease without esophagitis: Secondary | ICD-10-CM | POA: Diagnosis present

## 2021-01-20 DIAGNOSIS — J449 Chronic obstructive pulmonary disease, unspecified: Secondary | ICD-10-CM | POA: Diagnosis not present

## 2021-01-20 DIAGNOSIS — Z1611 Resistance to penicillins: Secondary | ICD-10-CM | POA: Diagnosis not present

## 2021-01-20 DIAGNOSIS — K8689 Other specified diseases of pancreas: Secondary | ICD-10-CM | POA: Diagnosis not present

## 2021-01-20 DIAGNOSIS — E785 Hyperlipidemia, unspecified: Secondary | ICD-10-CM | POA: Diagnosis not present

## 2021-01-20 DIAGNOSIS — A419 Sepsis, unspecified organism: Principal | ICD-10-CM | POA: Diagnosis present

## 2021-01-20 DIAGNOSIS — Z79890 Hormone replacement therapy (postmenopausal): Secondary | ICD-10-CM

## 2021-01-20 DIAGNOSIS — I110 Hypertensive heart disease with heart failure: Secondary | ICD-10-CM | POA: Diagnosis not present

## 2021-01-20 DIAGNOSIS — I700 Atherosclerosis of aorta: Secondary | ICD-10-CM | POA: Diagnosis not present

## 2021-01-20 DIAGNOSIS — I2510 Atherosclerotic heart disease of native coronary artery without angina pectoris: Secondary | ICD-10-CM | POA: Diagnosis present

## 2021-01-20 DIAGNOSIS — K838 Other specified diseases of biliary tract: Secondary | ICD-10-CM | POA: Diagnosis not present

## 2021-01-20 DIAGNOSIS — N136 Pyonephrosis: Secondary | ICD-10-CM | POA: Diagnosis not present

## 2021-01-20 DIAGNOSIS — Z20822 Contact with and (suspected) exposure to covid-19: Secondary | ICD-10-CM | POA: Diagnosis not present

## 2021-01-20 DIAGNOSIS — R9439 Abnormal result of other cardiovascular function study: Secondary | ICD-10-CM | POA: Diagnosis not present

## 2021-01-20 DIAGNOSIS — Z9049 Acquired absence of other specified parts of digestive tract: Secondary | ICD-10-CM

## 2021-01-20 DIAGNOSIS — B9620 Unspecified Escherichia coli [E. coli] as the cause of diseases classified elsewhere: Secondary | ICD-10-CM | POA: Diagnosis present

## 2021-01-20 DIAGNOSIS — G8929 Other chronic pain: Secondary | ICD-10-CM | POA: Diagnosis present

## 2021-01-20 DIAGNOSIS — M2550 Pain in unspecified joint: Secondary | ICD-10-CM | POA: Diagnosis not present

## 2021-01-20 DIAGNOSIS — N134 Hydroureter: Secondary | ICD-10-CM | POA: Diagnosis present

## 2021-01-20 DIAGNOSIS — B964 Proteus (mirabilis) (morganii) as the cause of diseases classified elsewhere: Secondary | ICD-10-CM | POA: Diagnosis present

## 2021-01-20 DIAGNOSIS — Z79891 Long term (current) use of opiate analgesic: Secondary | ICD-10-CM

## 2021-01-20 DIAGNOSIS — E1165 Type 2 diabetes mellitus with hyperglycemia: Secondary | ICD-10-CM | POA: Diagnosis not present

## 2021-01-20 DIAGNOSIS — R079 Chest pain, unspecified: Secondary | ICD-10-CM | POA: Diagnosis not present

## 2021-01-20 DIAGNOSIS — N138 Other obstructive and reflux uropathy: Secondary | ICD-10-CM | POA: Diagnosis present

## 2021-01-20 DIAGNOSIS — M109 Gout, unspecified: Secondary | ICD-10-CM | POA: Diagnosis present

## 2021-01-20 DIAGNOSIS — E1142 Type 2 diabetes mellitus with diabetic polyneuropathy: Secondary | ICD-10-CM | POA: Diagnosis present

## 2021-01-20 DIAGNOSIS — R778 Other specified abnormalities of plasma proteins: Secondary | ICD-10-CM | POA: Diagnosis present

## 2021-01-20 DIAGNOSIS — J302 Other seasonal allergic rhinitis: Secondary | ICD-10-CM | POA: Diagnosis present

## 2021-01-20 DIAGNOSIS — J431 Panlobular emphysema: Secondary | ICD-10-CM | POA: Diagnosis not present

## 2021-01-20 DIAGNOSIS — R6520 Severe sepsis without septic shock: Secondary | ICD-10-CM | POA: Diagnosis present

## 2021-01-20 DIAGNOSIS — I10 Essential (primary) hypertension: Secondary | ICD-10-CM | POA: Diagnosis not present

## 2021-01-20 DIAGNOSIS — Z90710 Acquired absence of both cervix and uterus: Secondary | ICD-10-CM

## 2021-01-20 DIAGNOSIS — E118 Type 2 diabetes mellitus with unspecified complications: Secondary | ICD-10-CM | POA: Diagnosis not present

## 2021-01-20 DIAGNOSIS — R531 Weakness: Secondary | ICD-10-CM | POA: Diagnosis not present

## 2021-01-20 DIAGNOSIS — E7800 Pure hypercholesterolemia, unspecified: Secondary | ICD-10-CM | POA: Diagnosis not present

## 2021-01-20 DIAGNOSIS — E782 Mixed hyperlipidemia: Secondary | ICD-10-CM | POA: Diagnosis present

## 2021-01-20 DIAGNOSIS — N2884 Pyelitis cystica: Secondary | ICD-10-CM | POA: Diagnosis not present

## 2021-01-20 DIAGNOSIS — N200 Calculus of kidney: Secondary | ICD-10-CM | POA: Diagnosis not present

## 2021-01-20 DIAGNOSIS — N281 Cyst of kidney, acquired: Secondary | ICD-10-CM | POA: Diagnosis not present

## 2021-01-20 DIAGNOSIS — N1330 Unspecified hydronephrosis: Secondary | ICD-10-CM | POA: Diagnosis not present

## 2021-01-20 DIAGNOSIS — R109 Unspecified abdominal pain: Secondary | ICD-10-CM

## 2021-01-20 DIAGNOSIS — Q8909 Congenital malformations of spleen: Secondary | ICD-10-CM | POA: Diagnosis not present

## 2021-01-20 DIAGNOSIS — Z7401 Bed confinement status: Secondary | ICD-10-CM | POA: Diagnosis not present

## 2021-01-20 DIAGNOSIS — M6282 Rhabdomyolysis: Secondary | ICD-10-CM | POA: Diagnosis not present

## 2021-01-20 DIAGNOSIS — I5189 Other ill-defined heart diseases: Secondary | ICD-10-CM | POA: Diagnosis not present

## 2021-01-20 DIAGNOSIS — Z7984 Long term (current) use of oral hypoglycemic drugs: Secondary | ICD-10-CM

## 2021-01-20 DIAGNOSIS — W1830XA Fall on same level, unspecified, initial encounter: Secondary | ICD-10-CM | POA: Diagnosis present

## 2021-01-20 DIAGNOSIS — J9601 Acute respiratory failure with hypoxia: Secondary | ICD-10-CM | POA: Diagnosis not present

## 2021-01-20 DIAGNOSIS — R1032 Left lower quadrant pain: Secondary | ICD-10-CM | POA: Diagnosis not present

## 2021-01-20 DIAGNOSIS — R0789 Other chest pain: Secondary | ICD-10-CM | POA: Diagnosis not present

## 2021-01-20 DIAGNOSIS — R072 Precordial pain: Secondary | ICD-10-CM | POA: Diagnosis not present

## 2021-01-20 DIAGNOSIS — G249 Dystonia, unspecified: Secondary | ICD-10-CM | POA: Diagnosis not present

## 2021-01-20 DIAGNOSIS — M7989 Other specified soft tissue disorders: Secondary | ICD-10-CM | POA: Diagnosis not present

## 2021-01-20 DIAGNOSIS — E8721 Acute metabolic acidosis: Secondary | ICD-10-CM | POA: Diagnosis not present

## 2021-01-20 DIAGNOSIS — K5730 Diverticulosis of large intestine without perforation or abscess without bleeding: Secondary | ICD-10-CM | POA: Diagnosis not present

## 2021-01-20 DIAGNOSIS — R000 Tachycardia, unspecified: Secondary | ICD-10-CM | POA: Diagnosis not present

## 2021-01-20 DIAGNOSIS — I248 Other forms of acute ischemic heart disease: Secondary | ICD-10-CM | POA: Diagnosis not present

## 2021-01-20 DIAGNOSIS — N12 Tubulo-interstitial nephritis, not specified as acute or chronic: Secondary | ICD-10-CM | POA: Diagnosis not present

## 2021-01-20 DIAGNOSIS — E860 Dehydration: Secondary | ICD-10-CM | POA: Diagnosis not present

## 2021-01-20 DIAGNOSIS — Z881 Allergy status to other antibiotic agents status: Secondary | ICD-10-CM

## 2021-01-20 DIAGNOSIS — R0602 Shortness of breath: Secondary | ICD-10-CM | POA: Diagnosis not present

## 2021-01-20 DIAGNOSIS — W19XXXA Unspecified fall, initial encounter: Secondary | ICD-10-CM | POA: Diagnosis not present

## 2021-01-20 DIAGNOSIS — Z79899 Other long term (current) drug therapy: Secondary | ICD-10-CM

## 2021-01-20 DIAGNOSIS — I447 Left bundle-branch block, unspecified: Secondary | ICD-10-CM | POA: Diagnosis present

## 2021-01-20 DIAGNOSIS — J90 Pleural effusion, not elsewhere classified: Secondary | ICD-10-CM | POA: Diagnosis not present

## 2021-01-20 DIAGNOSIS — Z87891 Personal history of nicotine dependence: Secondary | ICD-10-CM

## 2021-01-20 DIAGNOSIS — N132 Hydronephrosis with renal and ureteral calculous obstruction: Secondary | ICD-10-CM | POA: Diagnosis not present

## 2021-01-20 HISTORY — DX: Sepsis, unspecified organism: A41.9

## 2021-01-20 HISTORY — PX: CYSTOSCOPY WITH RETROGRADE PYELOGRAM, URETEROSCOPY AND STENT PLACEMENT: SHX5789

## 2021-01-20 LAB — CBC WITH DIFFERENTIAL/PLATELET
Abs Immature Granulocytes: 0.2 10*3/uL — ABNORMAL HIGH (ref 0.00–0.07)
Basophils Absolute: 0.1 10*3/uL (ref 0.0–0.1)
Basophils Relative: 0 %
Eosinophils Absolute: 0.5 10*3/uL (ref 0.0–0.5)
Eosinophils Relative: 3 %
HCT: 45.5 % (ref 36.0–46.0)
Hemoglobin: 14.6 g/dL (ref 12.0–15.0)
Immature Granulocytes: 1 %
Lymphocytes Relative: 2 %
Lymphs Abs: 0.3 10*3/uL — ABNORMAL LOW (ref 0.7–4.0)
MCH: 28.7 pg (ref 26.0–34.0)
MCHC: 32.1 g/dL (ref 30.0–36.0)
MCV: 89.4 fL (ref 80.0–100.0)
Monocytes Absolute: 0.3 10*3/uL (ref 0.1–1.0)
Monocytes Relative: 2 %
Neutro Abs: 16.6 10*3/uL — ABNORMAL HIGH (ref 1.7–7.7)
Neutrophils Relative %: 92 %
Platelets: 130 10*3/uL — ABNORMAL LOW (ref 150–400)
RBC: 5.09 MIL/uL (ref 3.87–5.11)
RDW: 14.2 % (ref 11.5–15.5)
WBC Morphology: INCREASED
WBC: 17.9 10*3/uL — ABNORMAL HIGH (ref 4.0–10.5)
nRBC: 0 % (ref 0.0–0.2)

## 2021-01-20 LAB — I-STAT CHEM 8, ED
BUN: 22 mg/dL (ref 8–23)
Calcium, Ion: 1.13 mmol/L — ABNORMAL LOW (ref 1.15–1.40)
Chloride: 105 mmol/L (ref 98–111)
Creatinine, Ser: 1.7 mg/dL — ABNORMAL HIGH (ref 0.44–1.00)
Glucose, Bld: 377 mg/dL — ABNORMAL HIGH (ref 70–99)
HCT: 51 % — ABNORMAL HIGH (ref 36.0–46.0)
Hemoglobin: 17.3 g/dL — ABNORMAL HIGH (ref 12.0–15.0)
Potassium: 3.6 mmol/L (ref 3.5–5.1)
Sodium: 136 mmol/L (ref 135–145)
TCO2: 16 mmol/L — ABNORMAL LOW (ref 22–32)

## 2021-01-20 LAB — COMPREHENSIVE METABOLIC PANEL
ALT: 43 U/L (ref 0–44)
AST: 58 U/L — ABNORMAL HIGH (ref 15–41)
Albumin: 3.1 g/dL — ABNORMAL LOW (ref 3.5–5.0)
Alkaline Phosphatase: 130 U/L — ABNORMAL HIGH (ref 38–126)
Anion gap: 11 (ref 5–15)
BUN: 20 mg/dL (ref 8–23)
CO2: 17 mmol/L — ABNORMAL LOW (ref 22–32)
Calcium: 8.1 mg/dL — ABNORMAL LOW (ref 8.9–10.3)
Chloride: 106 mmol/L (ref 98–111)
Creatinine, Ser: 1.73 mg/dL — ABNORMAL HIGH (ref 0.44–1.00)
GFR, Estimated: 30 mL/min — ABNORMAL LOW (ref >60)
Glucose, Bld: 327 mg/dL — ABNORMAL HIGH (ref 70–99)
Potassium: 3.2 mmol/L — ABNORMAL LOW (ref 3.5–5.1)
Sodium: 134 mmol/L — ABNORMAL LOW (ref 135–145)
Total Bilirubin: 0.6 mg/dL (ref 0.3–1.2)
Total Protein: 5.9 g/dL — ABNORMAL LOW (ref 6.5–8.1)

## 2021-01-20 LAB — URINALYSIS, ROUTINE W REFLEX MICROSCOPIC
Bacteria, UA: NONE SEEN
Bilirubin Urine: NEGATIVE
Glucose, UA: >=500 mg/dL — AB
Ketones, ur: NEGATIVE mg/dL
Nitrite: NEGATIVE
Protein, ur: NEGATIVE mg/dL
Specific Gravity, Urine: 1.005 (ref 1.005–1.030)
pH: 6 (ref 5.0–8.0)

## 2021-01-20 LAB — BLOOD GAS, VENOUS
Acid-base deficit: 6.9 mmol/L — ABNORMAL HIGH (ref 0.0–2.0)
Bicarbonate: 19.7 mmol/L — ABNORMAL LOW (ref 20.0–28.0)
O2 Saturation: 63.2 %
Patient temperature: 98.6
pCO2, Ven: 45.2 mmHg (ref 44.0–60.0)
pH, Ven: 7.262 (ref 7.250–7.430)
pO2, Ven: 35.9 mmHg (ref 32.0–45.0)

## 2021-01-20 LAB — PROTIME-INR
INR: 1.2 (ref 0.8–1.2)
Prothrombin Time: 14.8 seconds (ref 11.4–15.2)

## 2021-01-20 LAB — APTT: aPTT: 32 seconds (ref 24–36)

## 2021-01-20 LAB — GLUCOSE, CAPILLARY
Glucose-Capillary: 356 mg/dL — ABNORMAL HIGH (ref 70–99)
Glucose-Capillary: 319 mg/dL — ABNORMAL HIGH (ref 70–99)
Glucose-Capillary: 243 mg/dL — ABNORMAL HIGH (ref 70–99)
Glucose-Capillary: 179 mg/dL — ABNORMAL HIGH (ref 70–99)
Glucose-Capillary: 189 mg/dL — ABNORMAL HIGH (ref 70–99)

## 2021-01-20 LAB — RESP PANEL BY RT-PCR (FLU A&B, COVID) ARPGX2
Influenza A by PCR: NEGATIVE
Influenza B by PCR: NEGATIVE
SARS Coronavirus 2 by RT PCR: NEGATIVE

## 2021-01-20 LAB — LIPASE, BLOOD: Lipase: 18 U/L (ref 11–51)

## 2021-01-20 LAB — MRSA NEXT GEN BY PCR, NASAL: MRSA by PCR Next Gen: NOT DETECTED

## 2021-01-20 LAB — CK: Total CK: 1774 U/L — ABNORMAL HIGH (ref 38–234)

## 2021-01-20 LAB — LACTIC ACID, PLASMA
Lactic Acid, Venous: 8.1 mmol/L (ref 0.5–1.9)
Lactic Acid, Venous: 4.1 mmol/L (ref 0.5–1.9)

## 2021-01-20 LAB — CBG MONITORING, ED: Glucose-Capillary: 354 mg/dL — ABNORMAL HIGH (ref 70–99)

## 2021-01-20 SURGERY — CYSTOURETEROSCOPY, WITH RETROGRADE PYELOGRAM AND STENT INSERTION
Anesthesia: General | Laterality: Right

## 2021-01-20 MED ORDER — LACTATED RINGERS IV BOLUS
1000.0000 mL | Freq: Once | INTRAVENOUS | Status: AC
  Administered 2021-01-20: 1000 mL via INTRAVENOUS

## 2021-01-20 MED ORDER — UMECLIDINIUM BROMIDE 62.5 MCG/ACT IN AEPB
1.0000 | INHALATION_SPRAY | Freq: Every day | RESPIRATORY_TRACT | Status: DC
Start: 2021-01-21 — End: 2021-01-30
  Administered 2021-01-21: 1 via RESPIRATORY_TRACT
  Filled 2021-01-20 (×2): qty 7

## 2021-01-20 MED ORDER — LACTATED RINGERS IV BOLUS
500.0000 mL | Freq: Once | INTRAVENOUS | Status: AC
  Administered 2021-01-20: 500 mL via INTRAVENOUS

## 2021-01-20 MED ORDER — OXYCODONE HCL ER 10 MG PO T12A: 10.0000 mg | EXTENDED_RELEASE_TABLET | Freq: Every day | ORAL | Status: DC | PRN

## 2021-01-20 MED ORDER — SPIRONOLACTONE 25 MG PO TABS: 50.0000 mg | ORAL_TABLET | Freq: Every day | ORAL | Status: DC | PRN

## 2021-01-20 MED ORDER — SODIUM CHLORIDE 0.9 % IR SOLN
Status: DC | PRN
  Administered 2021-01-20: 3000 mL via INTRAVESICAL

## 2021-01-20 MED ORDER — FENTANYL CITRATE PF 50 MCG/ML IJ SOSY
50.0000 ug | PREFILLED_SYRINGE | Freq: Once | INTRAMUSCULAR | Status: AC
  Administered 2021-01-20: 50 ug via INTRAVENOUS
  Filled 2021-01-20: qty 1

## 2021-01-20 MED ORDER — LIDOCAINE 2% (20 MG/ML) 5 ML SYRINGE
INTRAMUSCULAR | Status: DC | PRN
  Administered 2021-01-20: 40 mg via INTRAVENOUS

## 2021-01-20 MED ORDER — ALBUTEROL SULFATE (2.5 MG/3ML) 0.083% IN NEBU
3.0000 mL | INHALATION_SOLUTION | RESPIRATORY_TRACT | Status: DC | PRN
  Administered 2021-01-23: 11:00:00 3 mL via RESPIRATORY_TRACT
  Filled 2021-01-20: qty 3

## 2021-01-20 MED ORDER — CHLORHEXIDINE GLUCONATE CLOTH 2 % EX PADS
6.0000 | MEDICATED_PAD | Freq: Every day | CUTANEOUS | Status: DC
  Administered 2021-01-20 – 2021-01-24 (×5): 6 via TOPICAL

## 2021-01-20 MED ORDER — SODIUM CHLORIDE 0.9 % IV BOLUS
1000.0000 mL | Freq: Once | INTRAVENOUS | Status: AC
  Administered 2021-01-20: 1000 mL via INTRAVENOUS

## 2021-01-20 MED ORDER — ACETAMINOPHEN 325 MG PO TABS
650.0000 mg | ORAL_TABLET | Freq: Four times a day (QID) | ORAL | Status: DC | PRN
  Filled 2021-01-20 (×2): qty 2

## 2021-01-20 MED ORDER — NITROGLYCERIN 0.4 MG SL SUBL
0.4000 mg | SUBLINGUAL_TABLET | SUBLINGUAL | Status: DC | PRN
  Administered 2021-01-26 (×2): 0.4 mg via SUBLINGUAL
  Filled 2021-01-20: qty 25
  Filled 2021-01-20 (×3): qty 1

## 2021-01-20 MED ORDER — ONDANSETRON HCL 4 MG/2ML IJ SOLN
4.0000 mg | Freq: Once | INTRAMUSCULAR | Status: AC
  Administered 2021-01-20: 4 mg via INTRAVENOUS
  Filled 2021-01-20: qty 2

## 2021-01-20 MED ORDER — POTASSIUM CHLORIDE 10 MEQ/100ML IV SOLN
10.0000 meq | INTRAVENOUS | Status: AC
  Filled 2021-01-20: qty 100

## 2021-01-20 MED ORDER — DIPHENOXYLATE-ATROPINE 2.5-0.025 MG PO TABS
2.0000 | ORAL_TABLET | Freq: Two times a day (BID) | ORAL | Status: DC
  Administered 2021-01-20 – 2021-01-29 (×19): 2 via ORAL
  Filled 2021-01-20 (×19): qty 2

## 2021-01-20 MED ORDER — PIPERACILLIN-TAZOBACTAM 3.375 G IVPB
3.3750 g | Freq: Once | INTRAVENOUS | Status: AC
  Administered 2021-01-20: 3.375 g via INTRAVENOUS
  Filled 2021-01-20: qty 50

## 2021-01-20 MED ORDER — PROPOFOL 10 MG/ML IV BOLUS
INTRAVENOUS | Status: AC
  Filled 2021-01-20: qty 20

## 2021-01-20 MED ORDER — ALUM & MAG HYDROXIDE-SIMETH 200-200-20 MG/5ML PO SUSP
30.0000 mL | Freq: Two times a day (BID) | ORAL | Status: DC
  Administered 2021-01-20 – 2021-01-29 (×17): 30 mL via ORAL
  Filled 2021-01-20 (×20): qty 30

## 2021-01-20 MED ORDER — GLIPIZIDE 10 MG PO TABS: 10.0000 mg | ORAL_TABLET | Freq: Two times a day (BID) | ORAL | Status: DC

## 2021-01-20 MED ORDER — PREGABALIN 25 MG PO CAPS
25.0000 mg | ORAL_CAPSULE | Freq: Two times a day (BID) | ORAL | Status: DC
  Administered 2021-01-20 – 2021-01-29 (×19): 25 mg via ORAL
  Filled 2021-01-20 (×18): qty 1

## 2021-01-20 MED ORDER — ONDANSETRON HCL 4 MG/2ML IJ SOLN
INTRAMUSCULAR | Status: DC | PRN
  Administered 2021-01-20: 4 mg via INTRAVENOUS

## 2021-01-20 MED ORDER — LOPERAMIDE HCL 2 MG PO CAPS
4.0000 mg | ORAL_CAPSULE | ORAL | Status: DC | PRN
  Administered 2021-01-21 – 2021-01-29 (×4): 4 mg via ORAL
  Filled 2021-01-20 (×4): qty 2

## 2021-01-20 MED ORDER — LORAZEPAM 1 MG PO TABS
1.0000 mg | ORAL_TABLET | Freq: Every evening | ORAL | Status: DC | PRN
  Administered 2021-01-20 – 2021-01-28 (×7): 1 mg via ORAL
  Filled 2021-01-20 (×7): qty 1

## 2021-01-20 MED ORDER — LIDOCAINE HCL (PF) 2 % IJ SOLN
INTRAMUSCULAR | Status: AC
  Filled 2021-01-20: qty 10

## 2021-01-20 MED ORDER — TOPIRAMATE 100 MG PO TABS
100.0000 mg | ORAL_TABLET | Freq: Every morning | ORAL | Status: DC
  Administered 2021-01-21 – 2021-01-29 (×9): 100 mg via ORAL
  Filled 2021-01-20 (×9): qty 1

## 2021-01-20 MED ORDER — PREGABALIN 25 MG PO CAPS
25.0000 mg | ORAL_CAPSULE | Freq: Once | ORAL | Status: DC
  Filled 2021-01-20: qty 1

## 2021-01-20 MED ORDER — MAGNESIUM SULFATE 2 GM/50ML IV SOLN
2.0000 g | Freq: Once | INTRAVENOUS | Status: AC
  Administered 2021-01-20: 2 g via INTRAVENOUS
  Filled 2021-01-20 (×2): qty 50

## 2021-01-20 MED ORDER — ACETAMINOPHEN 650 MG RE SUPP: 650.0000 mg | Freq: Four times a day (QID) | RECTAL | Status: DC | PRN

## 2021-01-20 MED ORDER — LACTATED RINGERS IV SOLN: INTRAVENOUS | Status: DC | PRN

## 2021-01-20 MED ORDER — LEVOTHYROXINE SODIUM 75 MCG PO TABS
75.0000 ug | ORAL_TABLET | Freq: Every day | ORAL | Status: DC
  Administered 2021-01-21 – 2021-01-26 (×6): 75 ug via ORAL
  Filled 2021-01-20 (×7): qty 1

## 2021-01-20 MED ORDER — TIZANIDINE HCL 4 MG PO TABS
4.0000 mg | ORAL_TABLET | Freq: Every day | ORAL | Status: DC
  Administered 2021-01-20 – 2021-01-29 (×10): 4 mg via ORAL
  Filled 2021-01-20 (×10): qty 1

## 2021-01-20 MED ORDER — HYDRALAZINE HCL 25 MG PO TABS: 25.0000 mg | ORAL_TABLET | Freq: Three times a day (TID) | ORAL | Status: DC | PRN

## 2021-01-20 MED ORDER — FLUTICASONE PROPIONATE 50 MCG/ACT NA SUSP: 2.0000 | Freq: Every day | NASAL | Status: DC | PRN

## 2021-01-20 MED ORDER — LACTATED RINGERS IV SOLN: INTRAVENOUS | Status: DC

## 2021-01-20 MED ORDER — PIPERACILLIN-TAZOBACTAM 3.375 G IVPB
3.3750 g | INTRAVENOUS | Status: AC
  Administered 2021-01-20: 3.375 g via INTRAVENOUS
  Filled 2021-01-20: qty 50

## 2021-01-20 MED ORDER — PANCRELIPASE (LIP-PROT-AMYL) 36000-114000 UNITS PO CPEP
72000.0000 [IU] | ORAL_CAPSULE | Freq: Three times a day (TID) | ORAL | Status: DC
  Administered 2021-01-21 – 2021-01-29 (×21): 72000 [IU] via ORAL
  Filled 2021-01-20 (×14): qty 2
  Filled 2021-01-20: qty 6
  Filled 2021-01-20 (×10): qty 2

## 2021-01-20 MED ORDER — DICLOFENAC EPOLAMINE 1.3 % EX PTCH
2.0000 | MEDICATED_PATCH | Freq: Two times a day (BID) | CUTANEOUS | Status: DC
  Administered 2021-01-20 – 2021-01-24 (×7): 2 via TRANSDERMAL
  Administered 2021-01-24: 10:00:00 1 via TRANSDERMAL
  Administered 2021-01-25 (×2): 2 via TRANSDERMAL
  Administered 2021-01-26: 09:00:00 1 via TRANSDERMAL
  Administered 2021-01-26 – 2021-01-29 (×6): 2 via TRANSDERMAL
  Filled 2021-01-20 (×20): qty 2

## 2021-01-20 MED ORDER — FENTANYL CITRATE (PF) 100 MCG/2ML IJ SOLN
INTRAMUSCULAR | Status: AC
  Filled 2021-01-20: qty 2

## 2021-01-20 MED ORDER — MORPHINE SULFATE (PF) 4 MG/ML IV SOLN
4.0000 mg | Freq: Once | INTRAVENOUS | Status: AC
  Administered 2021-01-20: 4 mg via INTRAVENOUS
  Filled 2021-01-20: qty 1

## 2021-01-20 MED ORDER — POTASSIUM CHLORIDE 10 MEQ/100ML IV SOLN
10.0000 meq | INTRAVENOUS | Status: DC
  Administered 2021-01-20: 10 meq via INTRAVENOUS
  Filled 2021-01-20: qty 100

## 2021-01-20 MED ORDER — INSULIN ASPART 100 UNIT/ML IJ SOLN
10.0000 [IU] | Freq: Once | INTRAMUSCULAR | Status: AC
  Administered 2021-01-20: 10 [IU] via SUBCUTANEOUS
  Filled 2021-01-20: qty 1

## 2021-01-20 MED ORDER — INSULIN ASPART 100 UNIT/ML IJ SOLN
0.0000 [IU] | Freq: Three times a day (TID) | INTRAMUSCULAR | Status: DC
  Administered 2021-01-20: 3 [IU] via SUBCUTANEOUS
  Administered 2021-01-21: 5 [IU] via SUBCUTANEOUS
  Administered 2021-01-21: 2 [IU] via SUBCUTANEOUS
  Administered 2021-01-22: 17:00:00 5 [IU] via SUBCUTANEOUS
  Administered 2021-01-22 (×2): 3 [IU] via SUBCUTANEOUS
  Administered 2021-01-23 (×2): 5 [IU] via SUBCUTANEOUS
  Administered 2021-01-23: 13:00:00 8 [IU] via SUBCUTANEOUS
  Administered 2021-01-24 (×2): 3 [IU] via SUBCUTANEOUS
  Administered 2021-01-25: 18:00:00 2 [IU] via SUBCUTANEOUS
  Administered 2021-01-25: 09:00:00 3 [IU] via SUBCUTANEOUS
  Administered 2021-01-25: 13:00:00 8 [IU] via SUBCUTANEOUS
  Administered 2021-01-26: 18:00:00 3 [IU] via SUBCUTANEOUS
  Administered 2021-01-26: 09:00:00 2 [IU] via SUBCUTANEOUS
  Administered 2021-01-26: 12:00:00 5 [IU] via SUBCUTANEOUS
  Administered 2021-01-27: 08:00:00 2 [IU] via SUBCUTANEOUS
  Administered 2021-01-27: 17:00:00 3 [IU] via SUBCUTANEOUS

## 2021-01-20 MED ORDER — PANTOPRAZOLE SODIUM 40 MG PO TBEC
40.0000 mg | DELAYED_RELEASE_TABLET | Freq: Every day | ORAL | Status: DC
  Administered 2021-01-20 – 2021-01-29 (×10): 40 mg via ORAL
  Filled 2021-01-20 (×10): qty 1

## 2021-01-20 MED ORDER — POTASSIUM CHLORIDE IN NACL 20-0.9 MEQ/L-% IV SOLN
INTRAVENOUS | Status: DC
  Filled 2021-01-20 (×6): qty 1000

## 2021-01-20 MED ORDER — PROPOFOL 10 MG/ML IV BOLUS
INTRAVENOUS | Status: DC | PRN
  Administered 2021-01-20: 140 mg via INTRAVENOUS

## 2021-01-20 MED ORDER — ZOLPIDEM TARTRATE 5 MG PO TABS
5.0000 mg | ORAL_TABLET | Freq: Every evening | ORAL | Status: DC | PRN
  Administered 2021-01-26 – 2021-01-28 (×3): 5 mg via ORAL
  Filled 2021-01-20 (×3): qty 1

## 2021-01-20 MED ORDER — ONDANSETRON HCL 4 MG/2ML IJ SOLN
4.0000 mg | Freq: Four times a day (QID) | INTRAMUSCULAR | Status: DC | PRN
  Administered 2021-01-25 – 2021-01-26 (×2): 4 mg via INTRAVENOUS
  Filled 2021-01-20 (×2): qty 2

## 2021-01-20 MED ORDER — IOHEXOL 300 MG/ML  SOLN
INTRAMUSCULAR | Status: DC | PRN
  Administered 2021-01-20: 20 mL

## 2021-01-20 MED ORDER — ONDANSETRON HCL 4 MG PO TABS
4.0000 mg | ORAL_TABLET | Freq: Four times a day (QID) | ORAL | Status: DC | PRN
  Administered 2021-01-24: 15:00:00 4 mg via ORAL
  Filled 2021-01-20: qty 1

## 2021-01-20 MED ORDER — PHENOL 1.4 % MT LIQD
1.0000 | OROMUCOSAL | Status: DC | PRN
  Administered 2021-01-21 (×3): 1 via OROMUCOSAL
  Filled 2021-01-20: qty 177

## 2021-01-20 MED ORDER — SODIUM CHLORIDE 0.9 % IV BOLUS: 500.0000 mL | Freq: Once | INTRAVENOUS | Status: DC

## 2021-01-20 MED ORDER — PIPERACILLIN-TAZOBACTAM 3.375 G IVPB 30 MIN
3.3750 g | Freq: Three times a day (TID) | INTRAVENOUS | Status: DC
  Administered 2021-01-20 – 2021-01-25 (×14): 3.375 g via INTRAVENOUS
  Filled 2021-01-20 (×29): qty 50

## 2021-01-20 MED ORDER — FENTANYL CITRATE (PF) 100 MCG/2ML IJ SOLN
INTRAMUSCULAR | Status: DC | PRN
  Administered 2021-01-20: 50 ug via INTRAVENOUS

## 2021-01-20 MED ORDER — HYDROXYZINE HCL 25 MG PO TABS
50.0000 mg | ORAL_TABLET | Freq: Every day | ORAL | Status: DC | PRN
  Administered 2021-01-23 – 2021-01-27 (×3): 50 mg via ORAL
  Filled 2021-01-20 (×4): qty 2

## 2021-01-20 MED ORDER — PHENYLEPHRINE 40 MCG/ML (10ML) SYRINGE FOR IV PUSH (FOR BLOOD PRESSURE SUPPORT)
PREFILLED_SYRINGE | INTRAVENOUS | Status: DC | PRN
  Administered 2021-01-20: 80 ug via INTRAVENOUS
  Administered 2021-01-20: 120 ug via INTRAVENOUS
  Administered 2021-01-20: 80 ug via INTRAVENOUS

## 2021-01-20 MED ORDER — NIFEDIPINE ER OSMOTIC RELEASE 60 MG PO TB24
60.0000 mg | ORAL_TABLET | Freq: Every day | ORAL | Status: DC
  Administered 2021-01-21 – 2021-01-23 (×3): 60 mg via ORAL
  Filled 2021-01-20 (×4): qty 1

## 2021-01-20 SURGICAL SUPPLY — 23 items
BAG URO CATCHER STRL LF (MISCELLANEOUS) ×3 IMPLANT
CATH URETL OPEN END 6FR 70 (CATHETERS) IMPLANT
CLOTH BEACON ORANGE TIMEOUT ST (SAFETY) ×3 IMPLANT
EXTRACTOR STONE NITINOL NGAGE (UROLOGICAL SUPPLIES) IMPLANT
GLOVE SURG ENC TEXT LTX SZ7 (GLOVE) ×3 IMPLANT
GOWN STRL REUS W/TWL LRG LVL3 (GOWN DISPOSABLE) ×3 IMPLANT
GUIDEWIRE ANG ZIPWIRE 038X150 (WIRE) ×2 IMPLANT
GUIDEWIRE STR DUAL SENSOR (WIRE) ×6 IMPLANT
IV NS 1000ML (IV SOLUTION) ×3
IV NS 1000ML BAXH (IV SOLUTION) ×1 IMPLANT
LASER FIB FLEXIVA PULSE ID 365 (Laser) IMPLANT
MANIFOLD NEPTUNE II (INSTRUMENTS) ×3 IMPLANT
PACK CYSTO (CUSTOM PROCEDURE TRAY) ×3 IMPLANT
SHEATH NAVIGATOR HD 11/13X28 (SHEATH) IMPLANT
SHEATH NAVIGATOR HD 11/13X36 (SHEATH) IMPLANT
SHEATH NAVIGATOR HD 12/14X46 (SHEATH) IMPLANT
STENT CONTOUR 7FR X 28 (STENTS) ×2 IMPLANT
TRACTIP FLEXIVA PULS ID 200XHI (Laser) IMPLANT
TRACTIP FLEXIVA PULSE ID 200 (Laser)
TRAY FOLEY MTR SLVR 16FR STAT (SET/KITS/TRAYS/PACK) ×2 IMPLANT
TUBING CONNECTING 10 (TUBING) ×2 IMPLANT
TUBING CONNECTING 10' (TUBING) ×1
TUBING UROLOGY SET (TUBING) ×3 IMPLANT

## 2021-01-20 NOTE — H&P (Signed)
Urology Consult   Physician requesting consult: Quintella Reichert, MD  Reason for consult: Right distal ureteral stone with obstruction, infection, pyonephrosis and emphysematous pyelitis.  History of Present Illness: Mariah Reed is a 75 y.o. who presented to the ED today with 2-day history of acute right flank pain.  She also had episodes of nausea and emesis that has resolved.  She denies fevers or chills.  She denies dysuria.  She denies gross hematuria.  She denies a prior history of urolithiasis.  She denies passing any stones.  She denies surgical intervention for any renal stones.  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  Her pain is present well controlled.  She has been afebrile.  She has a leukocytosis of 17.9, creatinine is 1.73 from baseline of 1.  Urinalysis demonstrates many bacteria, positive nitrite, moderate leukocyte esterase.  She has past medical history of COPD, diabetes, hypertension, hyperlipidemia.  She has had a hysterectomy and cholecystectomy.  Past Medical History:  Diagnosis Date   Allergy    COPD (chronic obstructive pulmonary disease) ([place])    Diabetes mellitus    Gout    Hyperlipidemia    Hypertension    Low back pain    Peripheral neuropathy     Past Surgical History:  Procedure Laterality Date   ABDOMINAL HYSTERECTOMY     CHOLECYSTECTOMY  unknown   Patient stated she does not remember when it was removed. (possibly 1997)   [facility] Medications:  Home meds:  No current facility-administered medications on file prior to encounter.   Current Outpatient Medications on File  Prior to Encounter  Medication Sig Dispense Refill   albuterol (VENTOLIN HFA) 108 (90 Base) MCG/ACT inhaler INHALE 2 PUFFS INTO THE LUNGS EVERY 4 HOURS AS NEEDED 18 g 11   Cyanocobalamin (B-12 PO) Take by mouth.     diclofenac (FLECTOR) 1.3 % PTCH Place 1 patch onto the skin 2 (two) times daily. 60 patch 0   diclofenac sodium (VOLTAREN) 1 % GEL APPLY 2 GRAMS TOPICALLY 4 TIMES DAILY 100 g PRN   empagliflozin (JARDIANCE) 10 MG TABS tablet Take 1 tablet (10 mg total) by mouth daily before breakfast. 30 tablet 0   esomeprazole (NEXIUM) 40 MG capsule TAKE 1 CAPSULE BY MOUTH ONCE DAILY 30 capsule 0   fexofenadine (ALLEGRA) 180 MG tablet Take 180 mg by mouth as needed.       fluticasone (FLONASE) 50 MCG/ACT nasal spray Place 2 sprays into both nostrils daily. 16 g 11   glipiZIDE (GLUCOTROL) 10 MG tablet Take 1 tablet (10 mg total) by mouth 2 (two) times daily before a meal. 60 tablet 3   hydrALAZINE (APRESOLINE) 10 MG tablet Take 1 tablet (10 mg total) by mouth 3 (three) times daily. 90 tablet 0   levothyroxine (SYNTHROID) 75 MCG tablet Take 1 tablet (75 mcg total) by mouth daily. 90 tablet 1   lisinopril (ZESTRIL) 20 MG tablet Take 1 tablet (20 mg total) by mouth daily. 90 tablet 0   lovastatin (MEVACOR) 40 MG tablet Take 1 tablet (40 mg total) by mouth at bedtime. [place]  tablet 3   metoprolol tartrate (LOPRESSOR) 50 MG tablet TAKE 1 TABLET BY MOUTH TWICE A DAY AS NEEDED FOR INCREASED HEART RATE 60 tablet 5   NIFEdipine (PROCARDIA) 10 MG capsule Take by mouth. Not sure of dosage     nitroGLYCERIN (NITROSTAT) 0.4 MG SL tablet Take SL prn chest pain/May repeat 5 min apart up to 3 times prn 25 tablet 5   oxyCODONE (OXYCONTIN) 20 mg 12 hr tablet Take 1 tablet (20 mg total) by mouth every 12 (twelve) hours. 60 tablet 0   PATADAY 0.2 % SOLN PLACE 1 DROP INTO BOTH EYES EVERY DAY 2.5 mL 2   pregabalin (LYRICA) 25 MG capsule Take 1 capsule (25 mg total) by mouth 2 (two) times daily. 60 capsule 0   tiotropium  (SPIRIVA HANDIHALER) 18 MCG inhalation capsule INHALE THE CONTENTS OF 1 CAPSUOLE VIA HANDIHALER ONCE DAILY 30 capsule 11   topiramate (TOPAMAX) 100 MG tablet Take 3 tablets (300 mg total) by mouth daily. 90 tablet 5   VITAMIN D PO Take 1,000 mg by mouth.     zolpidem (AMBIEN) 5 MG tablet Take 1 tablet (5 mg total) by mouth at bedtime as needed. for sleep 30 tablet 0   ACCU-CHEK FASTCLIX LANCETS MISC 1 Device by Does not apply route 3 (three) times daily. Use to check blood sugar up to three times a day 102 each 12   hydrOXYzine (ATARAX/VISTARIL) 50 MG tablet Take 1 tablet (50 mg total) by mouth 3 (three) times daily as needed. 270 tablet 1   nystatin cream (MYCOSTATIN) APPLY TO AFFECTED AREAS ONCE DAILY AS NEEDED 30 g PRN   oxyCODONE (OXYCONTIN) 20 mg 12 hr tablet Take 1 tablet (20 mg total) by mouth every 12 (twelve) hours. 60 tablet 0   OXYCONTIN 20 MG 12 hr tablet Take 1 tablet (20 mg total) by mouth every 12 (twelve) hours. 60 tablet 0     Scheduled Meds: Continuous Infusions:  0.9 % NaCl with KCl 20 mEq / L     [MAR Hold] magnesium sulfate bolus IVPB     [MAR Hold] piperacillin-tazobactam (ZOSYN)  IV 3.375 g (01/20/21 1158)   PRN Meds:.  Allergies:  Allergies  Allergen Reactions   Levofloxacin Hives    Family History  Problem Relation Age of Onset   Uterine cancer Mother     Social History:  reports that she has quit smoking. Her smoking use included cigarettes. She has a 25.00 pack-year smoking history. She has never used smokeless tobacco. She reports that she does not drink alcohol and does not use drugs.  ROS: A complete review of systems was performed.  All systems are negative except for pertinent findings as noted.  Physical Exam:  Vital signs in last 24 hours: Temp:  [98.4 F (36.9 C)-98.6 F (37 C)] 98.4 F (36.9 C) (12/16 1021) Pulse Rate:  [102-128] 102 (12/16 1021) Resp:  [16-24] 19 (12/16 1021) BP: (97-120)/(55-84) 97/59 (12/16 1021) SpO2:  [91 %-95 %] 94  % (12/16 1021) Weight:  [77.1 kg-79.4 kg] 77.1 kg (12/16 1151) Constitutional:  Alert and oriented, No acute distress Cardiovascular: Regular rate and rhythm Respiratory: Normal respiratory effort, Lungs clear bilaterally GI: Abdomen is soft, nontender, nondistended, no abdominal masses GU: No CVA tendernes Neurologic: Grossly intact, no focal deficits Psychiatric: Normal mood and affect  Laboratory Data:  Recent Labs    01/20/21 0655 01/20/21 0830  WBC  --  17.9*  HGB 17.3* 14.6  HCT 51.0* 45.5  PLT  --  130*    Recent Labs    01/20/21 0655 01/20/21 0830  NA 136 134*  K 3.6 3.2*  CL 105 106  GLUCOSE 377* 327*  BUN 22 20  CALCIUM  --  8.1*  CREATININE 1.70* 1.73*     Results for orders placed or performed during the hospital encounter of 01/20/21 (from the past 24 hour(s))  Lactic acid, plasma     Status: Abnormal   Collection Time: 01/20/21  6:35 AM  Result Value Ref Range   Lactic Acid, Venous 8.1 (HH) 0.5 - 1.9 mmol/L  I-stat chem 8, ED     Status: Abnormal   Collection Time: 01/20/21  6:55 AM  Result Value Ref Range   Sodium 136 135 - 145 mmol/L   Potassium 3.6 3.5 - 5.1 mmol/L   Chloride 105 98 - 111 mmol/L   BUN 22 8 - 23 mg/dL   Creatinine, Ser 1.70 (H) 0.44 - 1.00 mg/dL   Glucose, Bld 377 (H) 70 - 99 mg/dL   Calcium, Ion 1.13 (L) 1.15 - 1.40 mmol/L   TCO2 16 (L) 22 - 32 mmol/L   Hemoglobin 17.3 (H) 12.0 - 15.0 g/dL   HCT 51.0 (H) 36.0 - 46.0 %  CBG monitoring, ED     Status: Abnormal   Collection Time: 01/20/21  6:55 AM  Result Value Ref Range   Glucose-Capillary 354 (H) 70 - 99 mg/dL  Resp Panel by RT-PCR (Flu A&B, Covid) Nasopharyngeal Swab     Status: None   Collection Time: 01/20/21  8:14 AM   Specimen: Nasopharyngeal Swab; Nasopharyngeal(NP) swabs in vial transport medium  Result Value Ref Range   SARS Coronavirus 2 by RT PCR NEGATIVE NEGATIVE   Influenza A by PCR NEGATIVE NEGATIVE   Influenza B by PCR NEGATIVE NEGATIVE  Comprehensive  metabolic panel     Status: Abnormal   Collection Time: 01/20/21  8:30 AM  Result Value Ref Range   Sodium 134 (L) 135 - 145 mmol/L   Potassium 3.2 (L) 3.5 - 5.1 mmol/L   Chloride 106 98 - 111 mmol/L   CO2 17 (L) 22 - 32 mmol/L   Glucose, Bld 327 (H) 70 - 99 mg/dL   BUN 20 8 - 23 mg/dL   Creatinine, Ser 1.73 (H) 0.44 - 1.00 mg/dL   Calcium 8.1 (L) 8.9 - 10.3 mg/dL   Total Protein 5.9 (L) 6.5 - 8.1 g/dL   Albumin 3.1 (L) 3.5 - 5.0 g/dL   AST 58 (H) 15 - 41 U/L   ALT 43 0 - 44 U/L   Alkaline Phosphatase 130 (H) 38 - 126 U/L   Total Bilirubin 0.6 0.3 - 1.2 mg/dL   GFR, Estimated 30 (L) >60 mL/min   Anion gap 11 5 - 15  CBC with Differential     Status: Abnormal   Collection Time: 01/20/21  8:30 AM  Result Value Ref Range   WBC 17.9 (H) 4.0 - 10.5 K/uL   RBC 5.09 3.87 - 5.11 MIL/uL   Hemoglobin 14.6 12.0 - 15.0 g/dL   HCT 45.5 36.0 - 46.0 %   MCV 89.4 80.0 - 100.0 fL   MCH 28.7 26.0 - 34.0 pg   MCHC 32.1 30.0 - 36.0 g/dL   RDW 14.2 11.5 - 15.5 %   Platelets 130 (L) 150 - 400 K/uL   nRBC 0.0 0.0 - 0.2 %   Neutrophils Relative % 92 %   Neutro Abs 16.6 (H) 1.7 - 7.7 K/uL  Lymphocytes Relative 2 %   Lymphs Abs 0.3 (L) 0.7 - 4.0 K/uL   Monocytes Relative 2 %   Monocytes Absolute 0.3 0.1 - 1.0 K/uL   Eosinophils Relative 3 %   Eosinophils Absolute 0.5 0.0 - 0.5 K/uL   Basophils Relative 0 %   Basophils Absolute 0.1 0.0 - 0.1 K/uL   WBC Morphology INCREASED BANDS (>20% BANDS)    RBC Morphology MORPHOLOGY UNREMARKABLE    Smear Review MORPHOLOGY UNREMARKABLE    Immature Granulocytes 1 %   Abs Immature Granulocytes 0.20 (H) 0.00 - 0.07 K/uL  Lactic acid, plasma     Status: Abnormal   Collection Time: 01/20/21  8:30 AM  Result Value Ref Range   Lactic Acid, Venous 4.1 (HH) 0.5 - 1.9 mmol/L  Lipase, blood     Status: None   Collection Time: 01/20/21  8:30 AM  Result Value Ref Range   Lipase 18 11 - 51 U/L  Protime-INR     Status: None   Collection Time: 01/20/21  8:30 AM   Result Value Ref Range   Prothrombin Time 14.8 11.4 - 15.2 seconds   INR 1.2 0.8 - 1.2  APTT     Status: None   Collection Time: 01/20/21  8:30 AM  Result Value Ref Range   aPTT 32 24 - 36 seconds  CK     Status: Abnormal   Collection Time: 01/20/21  8:30 AM  Result Value Ref Range   Total CK 1,774 (H) 38 - 234 U/L  Blood gas, venous (at [facility] and [facility], not at [facility])     Status: Abnormal   Collection Time: 01/20/21 10:50 AM  Result Value Ref Range   pH, Ven 7.262 7.250 - 7.430   pCO2, Ven 45.2 44.0 - 60.0 mmHg   pO2, Ven 35.9 32.0 - 45.0 mmHg   Bicarbonate 19.7 (L) 20.0 - 28.0 mmol/L   Acid-base deficit 6.9 (H) 0.0 - 2.0 mmol/L   O2 Saturation 63.2 %   Patient temperature 98.6   Glucose, capillary     Status: Abnormal   Collection Time: 01/20/21 11:44 AM  Result Value Ref Range   Glucose-Capillary 356 (H) 70 - 99 mg/dL   Comment 1 Notify RN    Recent Results (from the past 240 hour(s))  Resp Panel by RT-PCR (Flu A&B, Covid) Nasopharyngeal Swab     Status: None   Collection Time: 01/20/21  8:14 AM   Specimen: Nasopharyngeal Swab; Nasopharyngeal(NP) swabs in vial transport medium  Result Value Ref Range Status   SARS Coronavirus 2 by RT PCR NEGATIVE NEGATIVE Final    Comment: (NOTE) SARS-CoV-2 target nucleic acids are NOT DETECTED.  The SARS-CoV-2 RNA is generally detectable in upper respiratory specimens during the acute phase of infection. The lowest concentration of SARS-CoV-2 viral copies this assay can detect is 138 copies/mL. A negative result does not preclude SARS-Cov-2 infection and should not be used as the sole basis for treatment or other patient management decisions. A negative result may occur with  improper specimen collection/handling, submission of specimen other than nasopharyngeal swab, presence of viral mutation(s) within the areas targeted by this assay, and inadequate number of viral copies(<138 copies/mL). A negative result must be combined with clinical  observations, patient history, and epidemiological information. The expected result is Negative.  Fact Sheet for Patients:  EntrepreneurPulse.com.au  Fact Sheet for Healthcare Providers:  IncredibleEmployment.be  This test is no t yet approved or cleared by the [place] FDA and  has  been authorized for detection and/or diagnosis of SARS-CoV-2 by FDA under an Emergency Use Authorization (EUA). This EUA will remain  in effect (meaning this test can be used) for the duration of the COVID-19 declaration under Section 564(b)(1) of the Act, 21 U.S.C.section 360bbb-3(b)(1), unless the authorization is terminated  or revoked sooner.       Influenza A by PCR NEGATIVE NEGATIVE Final   Influenza B by PCR NEGATIVE NEGATIVE Final    Comment: (NOTE) The Xpert Xpress SARS-CoV-2/FLU/RSV plus assay is intended as an aid in the diagnosis of influenza from Nasopharyngeal swab specimens and should not be used as a sole basis for treatment. Nasal washings and aspirates are unacceptable for Xpert Xpress SARS-CoV-2/FLU/RSV testing.  Fact Sheet for Patients: EntrepreneurPulse.com.au  Fact Sheet for Healthcare Providers: IncredibleEmployment.be  This test is not yet approved or cleared by the [place] FDA and has been authorized for detection and/or diagnosis of SARS-CoV-2 by FDA under an Emergency Use Authorization (EUA). This EUA will remain in effect (meaning this test can be used) for the duration of the COVID-19 declaration under Section 564(b)(1) of the Act, 21 U.S.C. section 360bbb-3(b)(1), unless the authorization is terminated or revoked.  Performed at [facility], [place] [address] [place], [place] [address]     Renal Function: Recent Labs    01/20/21 6270 01/20/21 0830  CREATININE 1.70* 1.73*   Estimated Creatinine Clearance: 28.4 mL/min (A) (by C-G formula based on SCr of 1.73 mg/dL  (H)).  Radiologic Imaging: CT Abdomen Pelvis Wo Contrast  Result Date: 01/20/2021 CLINICAL DATA:  75 year old female with history of right lower quadrant abdominal pain. Suspected bowel obstruction. EXAM: CT ABDOMEN AND PELVIS WITHOUT CONTRAST TECHNIQUE: Multidetector CT imaging of the abdomen and pelvis was performed following the standard protocol without IV contrast. COMPARISON:  CT of the abdomen and pelvis 07/28/2020. FINDINGS: Lower chest: Aortic atherosclerosis. Patchy areas of ground-glass attenuation and septal thickening noted in the visualized lung bases. Hepatobiliary: No definite suspicious cystic or solid hepatic lesions are confidently identified on today's noncontrast CT examination. Status post cholecystectomy. Common bile duct is dilated measuring up to 14 mm in the porta hepatis, similar to prior examination from 07/28/2020, likely reflective of benign post cholecystectomy physiology. Pancreas: Diffuse fatty atrophy in the pancreas. No definite pancreatic mass or peripancreatic fluid collections or inflammatory changes are confidently identified on today's noncontrast CT examination. Spleen: Tiny splenule inferior to the spleen. Otherwise, unremarkable. Adrenals/Urinary Tract: Large staghorn calculus again noted in the lower pole collecting system of the right kidney extending into the right renal pelvis estimated to measure approximately 4.0 x 1.7 x 1.9 cm. In addition, in the distal third of the right ureter at or immediately before the right ureterovesicular junction (axial image 79 of series 3) there is a 3 mm obstructing calculus which is associated with mild proximal right hydroureteronephrosis. Extensive right-sided perinephric stranding is noted, out of proportion to the degree of right-sided hydroureteronephrosis, suggestive of recent rupture of a calyx. There is some fluid tracking caudally throughout the right retroperitoneal space and right pericolic gutter. Notably, there is  also gas present within the right renal collecting system, right ureter and non dependent portion of the urinary bladder. Unenhanced appearance of the left kidney and bilateral adrenal glands is normal. Urinary bladder is otherwise grossly normal in appearance. Stomach/Bowel: The unenhanced appearance of the stomach is normal. No pathologic dilatation of small bowel or colon. The appendix is not confidently identified and may be surgically absent. Regardless,  there are no inflammatory changes noted adjacent to the cecum to suggest the presence of an acute appendicitis at this time. Vascular/Lymphatic: Atherosclerotic calcifications in the abdominal aorta and pelvic vasculature. No lymphadenopathy noted in the abdomen or pelvis. Reproductive: Status post hysterectomy.  Ovaries are atrophic. Other: No significant volume of ascites.  No pneumoperitoneum. Musculoskeletal: There are no aggressive appearing lytic or blastic lesions noted in the visualized portions of the skeleton. IMPRESSION: 1. 3 mm obstructing calculus at the right ureterovesicular junction with mild proximal right hydroureteronephrosis, but extensive right-sided perinephric stranding out of proportion to the degree of apparent obstruction, suggesting rupture of a calyx. Notably, there is also extensive gas throughout the urinary bladder, right ureter and right renal collecting system, suggesting right-sided pyonephrosis. There is also a large staghorn calculus in the lower pole collecting system of the right kidney which extends into the right renal pelvis. Urologic consultation is strongly recommended. 2. Aortic atherosclerosis. 3. Findings in the lung bases which could be indicative of early or mild interstitial lung disease. Nonemergent outpatient follow-up high-resolution chest CT is recommended in the near future to better evaluate these findings. 4. Additional incidental findings, as above. Electronically Signed   By: Vinnie Langton M.D.   On:  01/20/2021 08:20   DG Chest Port 1 View  Result Date: 01/20/2021 CLINICAL DATA:  Questionable sepsis, RIGHT lower quadrant abdominal pain since last night, weakness, COPD, hypertension EXAM: PORTABLE CHEST 1 VIEW COMPARISON:  Portable exam 0834 hours compared to 10/29/2012 FINDINGS: Normal heart size and pulmonary vascularity. Mediastinal contours grossly normal for positioning and scoliosis. Questionable LEFT perihilar infiltrate. Remaining lungs clear. No pleural effusion or pneumothorax. Mild biconvex thoracolumbar scoliosis. IMPRESSION: Questionable LEFT perihilar infiltrate Electronically Signed   By: Lavonia Dana M.D.   On: 01/20/2021 08:52    I independently reviewed the above imaging studies.  Impression/Recommendation: Right emphysematous pyelitis with pyonephrosis: CT A/P 01/20/2021 with 3 mm obstructing right distal ureteral stone, gas in the collecting system, large staghorn stone in the right lower pole extending the collecting system with gas in the right lower pole, significant right-sided perinephric stranding suggesting rupture of a calyx.  Afebrile, WBC 17.9, creatinine 1.7, urinalysis indicating infection. AKI: Creatinine 1.7 from baseline of 1 at time of consultation. Distal right ureteral and staghorn right renal stone  -The risks, benefits and alternatives of cystoscopy with right JJ stent placement was discussed with the patient.  Risks include, but are not limited to: bleeding, urinary tract infection, ureteral injury, ureteral stricture disease, chronic pain, urinary symptoms, bladder injury, stent migration, the need for nephrostomy tube placement, MI, CVA, DVT, PE and the inherent risks with general anesthesia.  The patient voices understanding and wishes to proceed.  -I discussed that we will plan for retrograde right ureteral stent attempt.  We discussed that the proximal curl of the stent will likely be in the right upper pole.  She does have evidence of gas in her right  lower pole with a large staghorn stone.  If she decompensates, we will plan for interval and imaging and possible right percutaneous nephrostomy tube placement for maximal decompression. -We will plan for Foley catheter at the end the case. -Continue broad-spectrum antibiotics -Follow-up urine culture -She require outpatient definitive treatment of her stones, likely right PCNL.  I will arrange follow-up.  Matt R. Mirah Nevins MD 01/20/2021, 1:22 PM  [facility]  Pager: [phone]   CC: Quintella Reichert, MD

## 2021-01-20 NOTE — Progress Notes (Signed)
[facility] following for sepsis protocol. 

## 2021-01-20 NOTE — ED Notes (Signed)
Pt to surgery at this time w/ surgery tech. Pt given morphine and zofran per orders as ok'd by Rodena Piety pre-op RN.

## 2021-01-20 NOTE — ED Provider Notes (Signed)
Pt arrived as transfer for urology intervention. She has severe lactic acidosis, leukocytosis, elevation to renal fxn with Cr 1.7, CPK also elevated at 1774. Ct revealed obstructing calculus at right UVJ. Possibly calyx rupture. Also staghorn calculus to right kidney.   Started on IV abx.  Antibiotics re-ordered as malfunction on prior bag. Zosyn.   Urology notified, D/w uro who is planning on OR this AM and would like medicine as primary given pos septic stone/calyx rupture.   Plan for OR with Uro urgently.   BP reduced, will give 500 cc bolus LR prior to going to OR.  Will d/w hospitalist Dr Olevia Bowens who accepts pt for admission.  Urology at bedside.   .Critical Care Performed by: Jeanell Sparrow, DO Authorized by: Jeanell Sparrow, DO   Critical care provider statement:    Critical care time (minutes):  34   Critical care time was exclusive of:  Separately billable procedures and treating other patients   Critical care was necessary to treat or prevent imminent or life-threatening deterioration of the following conditions:  Sepsis   Critical care was time spent personally by me on the following activities:  Development of treatment plan with patient or surrogate, discussions with consultants, evaluation of patient's response to treatment, examination of patient, ordering and review of laboratory studies, ordering and review of radiographic studies, ordering and performing treatments and interventions, pulse oximetry, re-evaluation of patient's condition and review of old charts   Care discussed with: admitting provider      Jeanell Sparrow, DO 01/20/21 1626

## 2021-01-20 NOTE — Op Note (Addendum)
Operative Note  Preoperative diagnosis:  1.  Emphysematous pyelitis 2. Right hydronephrosis 3. 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  Emphysematous pyelitis 2. Right hydronephrosis 3. 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  Cystoscopy 2. Right retrograde pyelogram with interpretation 3. Right ureteral stent placement 4. Fluoroscopy <1 hour with intraoperative interpretation  Surgeon: Rexene Alberts, MD  Assistants:  None  Anesthesia:  General  Complications:  None  EBL:  Minimal  Specimens: 1.  ID Type Source Tests Collected by Time Destination  A : Urine Urine Urine, Cystoscope URINE CULTURE Janith Lima, MD 01/20/2021 1421     Drains/Catheters: 1.  Right 7Fr x 28cm ureteral stent  Intraoperative findings:   Cystoscopy demonstrated evidence of cystitis and debris throughout the bladder however no concerning findings of malignancy or bladder lesion. Right retrograde pyelogram demonstrated severe right hydroureteronephrosis with very tortuous ureter throughout the distal, mid and proximal ureter. Evidence of large radiopaque stone in the renal pelvis. Successful right ureteral stent placement with curl in the right upper pole and and bladder respectively.  Indication:  Mariah Reed is a 75 y.o. female with emphysematous pyelitis, right distal ureteral stone and large right staghorn stone with evidence of gas in her ureter and collecting system with evidence of pyelonephrosis.  After reviewing the management options for treatment, she elected to proceed with the above surgical procedure(s). We have discussed the potential benefits and risks of the procedure, side effects of the proposed treatment, the likelihood of the patient achieving the goals of the procedure, and any potential problems that might occur during the procedure or recuperation. Informed consent has been obtained.  Description of procedure: The  patient was taken to the operating room and general anesthesia was induced.  The patient was placed in the dorsal lithotomy position, prepped and draped in the usual sterile fashion, and preoperative antibiotics were administered. A preoperative time-out was performed.   Cystourethroscopy was performed.  The patients urethra was examined and was normal. The bladder was then systematically examined in its entirety. There was no evidence for any bladder tumors, stones.  She did have evidence of cystitis.  There was debris in the bladder and more debris was seen emanating from the right ureter after stent placement.  Attention then turned to the right ureteral orifice. A 0.038 zip wire was passed through the right orifice and over the wire a 5 Fr open ended catheter was inserted and gentle right retrograde pyelogram was performed demonstrating severe right hydronephrosis with tortuosity of the right distal ureter.  I did pass a separate zip wire and further pass the 5 [place] open-ended catheter.  Again there was significant tortuosity in the right proximal ureter, I was able to navigate a zip wire and 5 [place] open-ended catheter nto the renal pelvis and then above the stone in the upper pole. Aspirate was obtained and sent off as right renal pelvis urine for culture. Omnipaque contrast was injected through the ureteral catheter and a retrograde pyelogram was performed with findings as dictated above. The wire was then replaced and the open ended catheter was removed.   A 7Fr x 28cm was selected given her significant tortuosity of her ureter and this ureteral stent was advance over the wire. The stent was positioned appropriately under fluoroscopic and cystoscopic guidance.  The wire was then removed with an adequate stent curl noted in the renal pelvis as well as in the bladder.  The bladder was  then emptied with a Foley catheter.  The Foley catheter was left in place.  The procedure ended.  The patient  appeared to tolerate the procedure well and without complications.  The patient was able to be awakened and transferred to the recovery unit in satisfactory condition.   Plan: The Foley catheter to gravity overnight.  Continue broad-spectrum antibiotics.  Follow-up right renal pelvis urine culture.  I attempted to call her daughter with the phone number she provided me however this number has been disconnected.  I further attempted to call her contact in her chart it is listed as a friend, Tim.  He did not answer.  I will plan to discuss with her after the case and reach out to her daughter.  Matt R. [facility]  Pager: [phone]

## 2021-01-20 NOTE — ED Provider Notes (Signed)
°  Physical Exam  BP (!) 97/59 (BP Location: Left Arm)    Pulse (!) 102    Temp 98.4 F (36.9 C) (Oral)    Resp 19    Ht 5\' 5"  (1.651 m)    Wt 77.1 kg    SpO2 94%    BMI 28.29 kg/m   Physical Exam Vitals and nursing note reviewed.  Constitutional:      General: She is not in acute distress.    Appearance: She is well-developed. She is ill-appearing.  HENT:     Head: Normocephalic and atraumatic.  Eyes:     Conjunctiva/sclera: Conjunctivae normal.  Cardiovascular:     Rate and Rhythm: Normal rate and regular rhythm.     Heart sounds: No murmur heard. Pulmonary:     Effort: Pulmonary effort is normal. No respiratory distress.     Breath sounds: Normal breath sounds.  Abdominal:     Palpations: Abdomen is soft.     Tenderness: There is abdominal tenderness in the right lower quadrant. There is right CVA tenderness.  Musculoskeletal:        General: No swelling.     Cervical back: Neck supple.  Skin:    General: Skin is warm and dry.     Capillary Refill: Capillary refill takes less than 2 seconds.  Neurological:     Mental Status: She is alert.  Psychiatric:        Mood and Affect: Mood normal.    ED Course/Procedures   Clinical Course as of 01/20/21 1252  Fri Jan 20, 2021  0720 RLQ pain, vomiting, bedbound for 24 hours, syncope upon trying to get up; dehydrated here, pending CTAP [MK]    Clinical Course User Index [MK] Jacqualin Shirkey, MD    .Critical Care Performed by: Teressa Lower, MD Authorized by: Teressa Lower, MD   Critical care provider statement:    Critical care time (minutes):  30   Critical care was necessary to treat or prevent imminent or life-threatening deterioration of the following conditions:  Sepsis   Critical care was time spent personally by me on the following activities:  Development of treatment plan with patient or surrogate, discussions with consultants, evaluation of patient's response to treatment, examination of patient, ordering and  review of laboratory studies, ordering and review of radiographic studies, ordering and performing treatments and interventions, pulse oximetry, re-evaluation of patient's condition and review of old charts  MDM  Patient received in handoff.  Presents with right lower quadrant pain and concerns for intra-abdominal infection with sepsis.  Patient with a leukocytosis of 17.8, creatinine elevation to 1.73, pH 7.62 with a lactate of 8.1.  Initial concern for ischemic bowel, but CT instead showing obstructive right nephrolithiasis with concern for ruptured calyx.  Zosyn started and urology consulted who recommended transfer to [facility] for likely operative intervention.  Patient then transferred to [facility].       Teressa Lower, MD 01/20/21 1254

## 2021-01-20 NOTE — Progress Notes (Signed)
Bilateral lower extremity venous duplex has been completed. Preliminary results can be found in CV Proc through chart review.  Results were given to Dr. Olevia Bowens.  01/20/21 1:12 PM Carlos Levering RVT

## 2021-01-20 NOTE — ED Notes (Signed)
Patient transported to CT 

## 2021-01-20 NOTE — H&P (Signed)
History and Physical    Mariah Reed:[id number] DOB: [birth year] DOA: 01/20/2021  PCP: Gladstone Lighter, MD   Patient coming from: Home.   I have personally briefly reviewed patient's old medical records in [place]  Chief Complaint: "Kidney problem".  HPI: Mariah Reed is a 75 y.o. female with medical history significant of seasonal allergies, COPD, type II DM, gout, hyperlipidemia, hypertension, chronic lower back pain, peripheral neuropathy who is coming from [facility] ED to the emergency department due to severe sepsis in the setting of acute emphysematous pyelonephritis.  The patient stated she right lower quadrant pain for the past 2 days associated with multiple episodes of emesis.  Denied fever, chills, but feels very malaised.  Denies dysuria, frequency or hematuria.  No rhinorrhea, sore throat, dyspnea, wheezing or hemoptysis.  No headaches, chest pain, palpitations, diaphoresis, PND, orthopnea or pitting edema lower extremities.  No polyuria, polydipsia, polyphagia or blurred vision.  ED Course: Initial vital signs were temperature 98.6 F, pulse 128, respiration 18, BP 112/84 mmHg O2 sat 92% on room air.  The patient received Zosyn 3.375 g IVPB, 1000 mL of NS bolus, 1500 mL of LR bolus, fentanyl 50 mcg IVP, morphine 4 mg IVP x2 and Zofran 4 mg IVP x2.  Lab work: Her initial lactic acid was 8.1 and then 4.1 mmol/L.  CBC showed a white count of 17.9, hemoglobin 14.6 g/dL platelets 130.  Sodium 134, potassium 3.2, chloride 106 and CO2 17 mmol/L.  Glucose 327, BUN 20, creatinine 1.73 mg/dL.  LFTs show total protein of 5.9 and albumin of 3.1 g/dL.  AST was 58 and alkaline phosphatase 130 units/L.Lipase was normal.  Total CK was 1774.  Imaging: CT abdomen/pelvis without contrast showed 3 mm obstructing calculus right UVJ with mild proximal right hydroureteronephrosis but extensive right-sided perinephric stranding out of proportion to degree of apparent  obstruction suggesting rupture of the calyx.  There is also extensive gas throughout the urinary bladder, right ureter and right renal collecting system, suggesting right-sided pyelonephrosis.  There was a large staghorn calculus in the lower pole of the collecting system of the right kidney which is stents into the right renal pelvis.  There was aortic atherosclerosis.  There were early signs of early or mild interstitial lung disease.  Chest CT recommended in the near future to further evaluate.  Review of Systems: As per HPI otherwise all other systems reviewed and are negative.  Past Medical History:  Diagnosis Date   Allergy    COPD (chronic obstructive pulmonary disease) ([place])    Diabetes mellitus    Gout    Hyperlipidemia    Hypertension    Low back pain    Peripheral neuropathy    Past Surgical History:  Procedure Laterality Date   ABDOMINAL HYSTERECTOMY     CHOLECYSTECTOMY  unknown   Patient stated she does not remember when it was removed. (possibly 1997)   POLYPECTOMY     Colon   TUBAL LIGATION     Social History  reports that she has quit smoking. Her smoking use included cigarettes. She has a 25.00 pack-year smoking history. She has never used smokeless tobacco. She reports that she does not drink alcohol and does not use drugs.  Allergies  Allergen Reactions   Levofloxacin Hives    Family History  Problem Relation Age of Onset   Uterine cancer Mother    Prior to Admission medications   Medication Sig Start Date End Date Taking? Authorizing Provider  ACCU-CHEK FASTCLIX LANCETS MISC 1 Device by Does not apply route 3 (three) times daily. Use to check blood sugar up to three times a day 02/08/11   Lucille Passy, MD  albuterol (VENTOLIN HFA) 108 (90 Base) MCG/ACT inhaler INHALE 2 PUFFS INTO THE LUNGS EVERY 4 HOURS AS NEEDED 05/22/19   Libby Maw, MD  Cyanocobalamin (B-12 PO) Take by mouth.    [provider]  diclofenac (FLECTOR) 1.3 % PTCH Place 1  patch onto the skin 2 (two) times daily. 11/26/19   Virginia Crews, MD  diclofenac sodium (VOLTAREN) 1 % GEL APPLY 2 GRAMS TOPICALLY 4 TIMES DAILY 04/21/18   Lucille Passy, MD  empagliflozin (JARDIANCE) 10 MG TABS tablet Take 1 tablet (10 mg total) by mouth daily before breakfast. 11/03/19   Bacigalupo, Dionne Bucy, MD  esomeprazole (NEXIUM) 40 MG capsule TAKE 1 CAPSULE BY MOUTH ONCE DAILY 11/27/19   Virginia Crews, MD  fexofenadine (ALLEGRA) 180 MG tablet Take 180 mg by mouth as needed.      [provider]  fluticasone (FLONASE) 50 MCG/ACT nasal spray Place 2 sprays into both nostrils daily. 07/21/19   Libby Maw, MD  glipiZIDE (GLUCOTROL) 10 MG tablet Take 1 tablet (10 mg total) by mouth 2 (two) times daily before a meal. 10/27/19   Bacigalupo, Dionne Bucy, MD  hydrALAZINE (APRESOLINE) 10 MG tablet Take 1 tablet (10 mg total) by mouth 3 (three) times daily. 11/26/19   Virginia Crews, MD  hydrOXYzine (ATARAX/VISTARIL) 50 MG tablet Take 1 tablet (50 mg total) by mouth 3 (three) times daily as needed. 04/20/19   Libby Maw, MD  levothyroxine (SYNTHROID) 75 MCG tablet Take 1 tablet (75 mcg total) by mouth daily. 10/27/19   Virginia Crews, MD  lisinopril (ZESTRIL) 20 MG tablet Take 1 tablet (20 mg total) by mouth daily. 10/27/19   Virginia Crews, MD  lovastatin (MEVACOR) 40 MG tablet Take 1 tablet (40 mg total) by mouth at bedtime. 10/27/19   Virginia Crews, MD  metoprolol tartrate (LOPRESSOR) 50 MG tablet TAKE 1 TABLET BY MOUTH TWICE A DAY AS NEEDED FOR INCREASED HEART RATE 07/21/19   Libby Maw, MD  nitroGLYCERIN (NITROSTAT) 0.4 MG SL tablet Take SL prn chest pain/May repeat 5 min apart up to 3 times prn 07/21/19   Libby Maw, MD  nystatin cream (MYCOSTATIN) APPLY TO AFFECTED AREAS ONCE DAILY AS NEEDED 07/30/19   Libby Maw, MD  oxyCODONE (OXYCONTIN) 20 mg 12 hr tablet Take 1 tablet (20 mg total) by mouth every 12  (twelve) hours. 07/16/19   Libby Maw, MD  oxyCODONE (OXYCONTIN) 20 mg 12 hr tablet Take 1 tablet (20 mg total) by mouth every 12 (twelve) hours. 07/16/19   Libby Maw, MD  OXYCONTIN 20 MG 12 hr tablet Take 1 tablet (20 mg total) by mouth every 12 (twelve) hours. 07/16/19   Libby Maw, MD  PATADAY 0.2 % SOLN PLACE 1 DROP INTO BOTH EYES EVERY DAY 02/04/14   Lucille Passy, MD  pregabalin (LYRICA) 25 MG capsule Take 1 capsule (25 mg total) by mouth 2 (two) times daily. 11/26/19   Virginia Crews, MD  tiotropium (SPIRIVA HANDIHALER) 18 MCG inhalation capsule INHALE THE CONTENTS OF 1 CAPSUOLE VIA HANDIHALER ONCE DAILY 10/27/19   Virginia Crews, MD  topiramate (TOPAMAX) 100 MG tablet Take 3 tablets (300 mg total) by mouth daily. 10/27/19   Virginia Crews, MD  VITAMIN D PO Take 1,000 mg by mouth.    [provider]  zolpidem (AMBIEN) 5 MG tablet Take 1 tablet (5 mg total) by mouth at bedtime as needed. for sleep 11/26/19   Virginia Crews, MD   Physical Exam: Vitals:   01/20/21 0800 01/20/21 0813 01/20/21 0930 01/20/21 1021  BP: 120/65 120/65 (!) 105/55 (!) 97/59  Pulse: (!) 108 (!) 111 (!) 102 (!) 102  Resp: (!) 22 (!) 24 (!) 22 19  Temp:    98.4 F (36.9 C)  TempSrc:    Oral  SpO2: 91% 95% 92% 94%  Weight:    79.4 kg  Height:    5\' 5"  (1.651 m)   Constitutional: Looks acutely ill.  NAD, calm, comfortable Eyes: PERRL, lids and conjunctivae normal.  Bilateral scleral injection. ENMT: Mucous membranes and lips are very dry. Posterior pharynx clear of any exudate or lesions. Neck: normal, supple, no masses, no thyromegaly Respiratory: Mildly tachypneic.  Decreased breath sounds in bases, otherwise clear to auscultation bilaterally, no wheezing, no crackles. No accessory muscle use.  Cardiovascular: Sinus tachycardia in the low 100s, no murmurs / rubs / gallops. No extremity edema. 2+ pedal pulses. No carotid bruits.  Abdomen: No  distention.  Soft, positive right CVA and RLQ tenderness, no guarding or rebound, no masses palpated. No hepatosplenomegaly. Bowel sounds positive.  Musculoskeletal: Moderate generalized weakness.  No clubbing / cyanosis. Good ROM, no contractures. Normal muscle tone.  Skin: no acute rashes, lesions, ulcers on very limited dermatological examination. Neurologic: CN 2-12 grossly intact. Sensation intact, DTR normal. Strength 5/5 in all 4.  Psychiatric: Normal judgment and insight. Alert and oriented x 3. Normal mood.   Labs on Admission: I have personally reviewed following labs and imaging studies  CBC: Recent Labs  Lab 01/20/21 0655 01/20/21 0830  WBC  --  17.9*  NEUTROABS  --  16.6*  HGB 17.3* 14.6  HCT 51.0* 45.5  MCV  --  89.4  PLT  --  130*    Basic Metabolic Panel: Recent Labs  Lab 01/20/21 0655 01/20/21 0830  NA 136 134*  K 3.6 3.2*  CL 105 106  CO2  --  17*  GLUCOSE 377* 327*  BUN 22 20  CREATININE 1.70* 1.73*  CALCIUM  --  8.1*    GFR: Estimated Creatinine Clearance: 28.8 mL/min (A) (by C-G formula based on SCr of 1.73 mg/dL (H)).  Liver Function Tests: Recent Labs  Lab 01/20/21 0830  AST 58*  ALT 43  ALKPHOS 130*  BILITOT 0.6  PROT 5.9*  ALBUMIN 3.1*   Radiological Exams on Admission: CT Abdomen Pelvis Wo Contrast  Result Date: 01/20/2021 CLINICAL DATA:  75 year old female with history of right lower quadrant abdominal pain. Suspected bowel obstruction. EXAM: CT ABDOMEN AND PELVIS WITHOUT CONTRAST TECHNIQUE: Multidetector CT imaging of the abdomen and pelvis was performed following the standard protocol without IV contrast. COMPARISON:  CT of the abdomen and pelvis 07/28/2020. FINDINGS: Lower chest: Aortic atherosclerosis. Patchy areas of ground-glass attenuation and septal thickening noted in the visualized lung bases. Hepatobiliary: No definite suspicious cystic or solid hepatic lesions are confidently identified on today's noncontrast CT examination.  Status post cholecystectomy. Common bile duct is dilated measuring up to 14 mm in the porta hepatis, similar to prior examination from 07/28/2020, likely reflective of benign post cholecystectomy physiology. Pancreas: Diffuse fatty atrophy in the pancreas. No definite pancreatic mass or peripancreatic fluid collections or inflammatory changes are confidently identified on today's noncontrast CT  examination. Spleen: Tiny splenule inferior to the spleen. Otherwise, unremarkable. Adrenals/Urinary Tract: Large staghorn calculus again noted in the lower pole collecting system of the right kidney extending into the right renal pelvis estimated to measure approximately 4.0 x 1.7 x 1.9 cm. In addition, in the distal third of the right ureter at or immediately before the right ureterovesicular junction (axial image 79 of series 3) there is a 3 mm obstructing calculus which is associated with mild proximal right hydroureteronephrosis. Extensive right-sided perinephric stranding is noted, out of proportion to the degree of right-sided hydroureteronephrosis, suggestive of recent rupture of a calyx. There is some fluid tracking caudally throughout the right retroperitoneal space and right pericolic gutter. Notably, there is also gas present within the right renal collecting system, right ureter and non dependent portion of the urinary bladder. Unenhanced appearance of the left kidney and bilateral adrenal glands is normal. Urinary bladder is otherwise grossly normal in appearance. Stomach/Bowel: The unenhanced appearance of the stomach is normal. No pathologic dilatation of small bowel or colon. The appendix is not confidently identified and may be surgically absent. Regardless, there are no inflammatory changes noted adjacent to the cecum to suggest the presence of an acute appendicitis at this time. Vascular/Lymphatic: Atherosclerotic calcifications in the abdominal aorta and pelvic vasculature. No lymphadenopathy noted in the  abdomen or pelvis. Reproductive: Status post hysterectomy.  Ovaries are atrophic. Other: No significant volume of ascites.  No pneumoperitoneum. Musculoskeletal: There are no aggressive appearing lytic or blastic lesions noted in the visualized portions of the skeleton. IMPRESSION: 1. 3 mm obstructing calculus at the right ureterovesicular junction with mild proximal right hydroureteronephrosis, but extensive right-sided perinephric stranding out of proportion to the degree of apparent obstruction, suggesting rupture of a calyx. Notably, there is also extensive gas throughout the urinary bladder, right ureter and right renal collecting system, suggesting right-sided pyonephrosis. There is also a large staghorn calculus in the lower pole collecting system of the right kidney which extends into the right renal pelvis. Urologic consultation is strongly recommended. 2. Aortic atherosclerosis. 3. Findings in the lung bases which could be indicative of early or mild interstitial lung disease. Nonemergent outpatient follow-up high-resolution chest CT is recommended in the near future to better evaluate these findings. 4. Additional incidental findings, as above. Electronically Signed   By: Vinnie Langton M.D.   On: 01/20/2021 08:20   DG Chest Port 1 View  Result Date: 01/20/2021 CLINICAL DATA:  Questionable sepsis, RIGHT lower quadrant abdominal pain since last night, weakness, COPD, hypertension EXAM: PORTABLE CHEST 1 VIEW COMPARISON:  Portable exam 0834 hours compared to 10/29/2012 FINDINGS: Normal heart size and pulmonary vascularity. Mediastinal contours grossly normal for positioning and scoliosis. Questionable LEFT perihilar infiltrate. Remaining lungs clear. No pleural effusion or pneumothorax. Mild biconvex thoracolumbar scoliosis. IMPRESSION: Questionable LEFT perihilar infiltrate Electronically Signed   By: Lavonia Dana M.D.   On: 01/20/2021 08:52    EKG: Independently reviewed.  Vent. rate 131 BPM PR  interval 107 ms QRS duration 149 ms QT/QTcB 394/582 ms P-R-T axes -2 1 158 Sinus tachycardia Left bundle branch block  Assessment/Plan Principal Problem:   Severe sepsis POA (HCC) Due to:   Acute pyonephrosis Which is causing:   Hydronephrosis of right kidney   Hydroureter on right Status post retrogrades cystoscopy, Staghorn stone removal and stent placement. Admit to stepdown/inpatient. Continue IV fluids. Continue analgesics as needed. Antiemetics as needed. Continue Zosyn every 8 hours. Follow-up CBC and chemistry in AM. Follow-up cultures and sensitivity. Urology  consult and intervention appreciated.  Active Problems:   Rhabdomyolysis Continue IV fluids. Follow total CK in AM. Hold statin.    Diabetes mellitus type 2 with complications (HCC) Carbohydrate modified diet.    HLD (hyperlipidemia) Hold lovastatin.    GOUT Continue allopurinol.    Essential hypertension Continue Procardia XL 60 mg p.o. daily. Continue metoprolol tartrate 12.5 mg p.o. twice daily as needed.    COPD (chronic obstructive pulmonary disease) (HCC) Continuous.. Albuterol MDI as needed.    Gastroesophageal reflux disease without esophagitis Continue PPI.    Hypothyroidism Continue levothyroxine 75 mcg p.o. daily.    DVT prophylaxis: SCDs. Code Status:   Full code. Family Communication:   Disposition Plan:   Patient is from:  Home.  Anticipated DC to:  Home.  Anticipated DC date:  01/23/2021 to 01/24/2021.  Anticipated DC barriers: Clinical status.  Consults called:  Urology. Admission status:  Inpatient/stepdown.   Severity of Illness: High severity in the setting of acute pyelonephrosis/emphysematous pyelonephritis with severe sepsis.  The patient will be admitted to continue IV antibiotics and be surgically intervened by urology.  Reubin Milan MD [facility]  How to contact the [facility] Attending or Consulting provider [place] or covering provider during  after hours [place], for this patient?   Check the care team in [facility] and look for a) attending/consulting [facility] provider listed and b) the [facility] team listed Log into [email or web address] and use [place]'s universal password to access. If you do not have the password, please contact the hospital operator. Locate the [facility] provider you are looking for under [facility] and page to a number that you can be directly reached. If you still have difficulty reaching the provider, please page the [facility] (Director on Call) for the Hospitalists listed on amion for assistance.  01/20/2021, 11:50 AM   This document was prepared using Dragon voice recognition software and may contain some unintended transcription errors.

## 2021-01-20 NOTE — Anesthesia Procedure Notes (Signed)
Procedure Name: LMA Insertion Date/Time: 01/20/2021 2:01 PM Performed by: Gean Maidens, CRNA Pre-anesthesia Checklist: Patient identified, Emergency Drugs available, Suction available, Patient being monitored and Timeout performed Patient Re-evaluated:Patient Re-evaluated prior to induction Oxygen Delivery Method: Circle system utilized Preoxygenation: Pre-oxygenation with 100% oxygen Induction Type: IV induction Ventilation: Mask ventilation without difficulty LMA: LMA inserted LMA Size: 4.0 Grade View: Grade I Number of attempts: 1 Placement Confirmation: positive ETCO2 and breath sounds checked- equal and bilateral Tube secured with: Tape Dental Injury: Teeth and Oropharynx as per pre-operative assessment

## 2021-01-20 NOTE — ED Triage Notes (Addendum)
Pt here from home via [facility]. Pt attempted to get up from bed, legs became weak, legs could not support her. Pt slid down to floor, without fall, Pt unable to get up off floor. Pt reports vomiting starting 12/15.  130/80 HR 110 RR 17 100% RA

## 2021-01-20 NOTE — ED Notes (Signed)
Tubing failed with antibiotics. Punctured hole when priming bag. Infusion stopped, apporx 12.69mL administered before stopping.

## 2021-01-20 NOTE — Transfer of Care (Signed)
Immediate Anesthesia Transfer of Care Note  Patient: Mariah Reed  Procedure(s) Performed: CYSTOSCOPY WITH RETROGRADE PYELOGRAM, URETEROSCOPY AND STENT PLACEMENT (Right)  Patient Location: PACU  Anesthesia Type:General  Level of Consciousness: awake  Airway & Oxygen Therapy: Patient Spontanous Breathing and Patient connected to face mask oxygen  Post-op Assessment: Report given to RN and Post -op Vital signs reviewed and stable  Post vital signs: Reviewed and stable  Last Vitals:  Vitals Value Taken Time    01/20/21 1445  Temp    Pulse 100 01/20/21 1447  Resp 18 01/20/21 1447  SpO2 96 % 01/20/21 1447  Vitals shown include unvalidated device data.  Last Pain:  Vitals:   01/20/21 1151  TempSrc:   PainSc: 0-No pain         Complications: No notable events documented.

## 2021-01-20 NOTE — ED Provider Notes (Signed)
[facility] EMERGENCY DEPARTMENT Provider Note   CSN: [id number] Arrival date & time: 01/20/21  0551     History Chief Complaint  Patient presents with   Fall   Weakness   Abdominal Pain    BARBARANN KELLY is a 75 y.o. female.  The history is provided by the patient and medical records.  Fall Associated symptoms include abdominal pain.  Weakness Associated symptoms: abdominal pain   Abdominal Pain TERESITA FANTON is a 75 y.o. female who presents to the Emergency Department complaining of abdominal pain.  She presents to the emergency department for evaluation of right lower quadrant abdominal pain that started last night.  Shortly after the pain started she developed numerous episodes of emesis.  She states that the pain was pretty significant so she stayed in bed and was unable to roll over or move.  When she attempted to get out of bed this morning she had trouble getting up and she slid slowly to the floor.  She did not injure herself in the fall.  She just could not get up because she was weak.  No fevers, diarrhea.     Past Medical History:  Diagnosis Date   Allergy    COPD (chronic obstructive pulmonary disease) ([place])    Diabetes mellitus    Gout    Hyperlipidemia    Hypertension    Low back pain    Peripheral neuropathy     Patient Active Problem List   Diagnosis Date Noted   Diarrhea 10/27/2019   Threatening behavior 08/27/2019   Non-compliance 08/27/2019   Insomnia due to medical condition 07/16/2019   Otalgia of both ears 03/17/2019   Encounter for long-term (current) use of high-risk medication 05/07/2017   Back pain 04/27/2017   Opioid abuse, daily use ([place]) 10/03/2016   OA (osteoarthritis) 07/11/2015   Cervical disc disorder with radiculopathy of cervical region 25/95/6387   Family conflict 56/43/3295   Chronic headaches 04/03/2012   Hypothyroidism 09/18/2010   Vitamin D deficiency 06/14/2010   ECHOCARDIOGRAM, ABNORMAL 03/16/2010    MYOCARDIAL INFARCTION, HX OF 02/09/2010   LEFT BUNDLE BRANCH BLOCK 12/26/2009   Gastroesophageal reflux disease without esophagitis 12/26/2009   COPD (chronic obstructive pulmonary disease) ([place]) 11/10/2009   Diabetes mellitus type 2 with complications ([place]) 18/84/1660   HLD (hyperlipidemia) 03/12/2006   GOUT 03/12/2006   Hereditary and idiopathic peripheral neuropathy 03/12/2006   Essential hypertension 03/12/2006   Allergic rhinitis 03/12/2006    Past Surgical History:  Procedure Laterality Date   ABDOMINAL HYSTERECTOMY     CHOLECYSTECTOMY  unknown   Patient stated she does not remember when it was removed. (possibly 1997)   POLYPECTOMY     Colon   TUBAL LIGATION       OB History   No obstetric history on file.     Family History  Problem Relation Age of Onset   Uterine cancer Mother     Social History   Tobacco Use   Smoking status: Former    Packs/day: 2.50    Years: 10.00    Pack years: 25.00    Types: Cigarettes   Smokeless tobacco: Never  Substance Use Topics   Alcohol use: No   Drug use: No    Home Medications Prior to Admission medications   Medication Sig Start Date End Date Taking? Authorizing Provider  ACCU-CHEK FASTCLIX LANCETS MISC 1 Device by Does not apply route 3 (three) times daily. Use to check blood sugar up to three  times a day 02/08/11   Lucille Passy, MD  albuterol (VENTOLIN HFA) 108 [phone] Base) MCG/ACT inhaler INHALE 2 PUFFS INTO THE LUNGS EVERY 4 HOURS AS NEEDED 05/22/19   Libby Maw, MD  Cyanocobalamin (B-12 PO) Take by mouth.    [provider]  diclofenac (FLECTOR) 1.3 % PTCH Place 1 patch onto the skin 2 (two) times daily. 11/26/19   Virginia Crews, MD  diclofenac sodium (VOLTAREN) 1 % GEL APPLY 2 GRAMS TOPICALLY 4 TIMES DAILY 04/21/18   Lucille Passy, MD  empagliflozin (JARDIANCE) 10 MG TABS tablet Take 1 tablet (10 mg total) by mouth daily before breakfast. 11/03/19   Bacigalupo, Dionne Bucy, MD  esomeprazole  (NEXIUM) 40 MG capsule TAKE 1 CAPSULE BY MOUTH ONCE DAILY 11/27/19   Virginia Crews, MD  fexofenadine (ALLEGRA) 180 MG tablet Take 180 mg by mouth as needed.      [provider]  fluticasone (FLONASE) 50 MCG/ACT nasal spray Place 2 sprays into both nostrils daily. 07/21/19   Libby Maw, MD  glipiZIDE (GLUCOTROL) 10 MG tablet Take 1 tablet (10 mg total) by mouth 2 (two) times daily before a meal. 10/27/19   Bacigalupo, Dionne Bucy, MD  hydrALAZINE (APRESOLINE) 10 MG tablet Take 1 tablet (10 mg total) by mouth 3 (three) times daily. 11/26/19   Virginia Crews, MD  hydrOXYzine (ATARAX/VISTARIL) 50 MG tablet Take 1 tablet (50 mg total) by mouth 3 (three) times daily as needed. 04/20/19   Libby Maw, MD  levothyroxine (SYNTHROID) 75 MCG tablet Take 1 tablet (75 mcg total) by mouth daily. 10/27/19   Virginia Crews, MD  lisinopril (ZESTRIL) 20 MG tablet Take 1 tablet (20 mg total) by mouth daily. 10/27/19   Virginia Crews, MD  lovastatin (MEVACOR) 40 MG tablet Take 1 tablet (40 mg total) by mouth at bedtime. 10/27/19   Virginia Crews, MD  metoprolol tartrate (LOPRESSOR) 50 MG tablet TAKE 1 TABLET BY MOUTH TWICE A DAY AS NEEDED FOR INCREASED HEART RATE 07/21/19   Libby Maw, MD  nitroGLYCERIN (NITROSTAT) 0.4 MG SL tablet Take SL prn chest pain/May repeat 5 min apart up to 3 times prn 07/21/19   Libby Maw, MD  nystatin cream (MYCOSTATIN) APPLY TO AFFECTED AREAS ONCE DAILY AS NEEDED 07/30/19   Libby Maw, MD  oxyCODONE (OXYCONTIN) 20 mg 12 hr tablet Take 1 tablet (20 mg total) by mouth every 12 (twelve) hours. 07/16/19   Libby Maw, MD  oxyCODONE (OXYCONTIN) 20 mg 12 hr tablet Take 1 tablet (20 mg total) by mouth every 12 (twelve) hours. 07/16/19   Libby Maw, MD  OXYCONTIN 20 MG 12 hr tablet Take 1 tablet (20 mg total) by mouth every 12 (twelve) hours. 07/16/19   Libby Maw, MD  PATADAY 0.2  % SOLN PLACE 1 DROP INTO BOTH EYES EVERY DAY 02/04/14   Lucille Passy, MD  pregabalin (LYRICA) 25 MG capsule Take 1 capsule (25 mg total) by mouth 2 (two) times daily. 11/26/19   Virginia Crews, MD  tiotropium (SPIRIVA HANDIHALER) 18 MCG inhalation capsule INHALE THE CONTENTS OF 1 CAPSUOLE VIA HANDIHALER ONCE DAILY 10/27/19   Virginia Crews, MD  topiramate (TOPAMAX) 100 MG tablet Take 3 tablets (300 mg total) by mouth daily. 10/27/19   Virginia Crews, MD  VITAMIN D PO Take 1,000 mg by mouth.    [provider]  zolpidem (AMBIEN) 5 MG tablet Take 1  tablet (5 mg total) by mouth at bedtime as needed. for sleep 11/26/19   Virginia Crews, MD    Allergies    Levofloxacin  Review of Systems   Review of Systems  Gastrointestinal:  Positive for abdominal pain.  Neurological:  Positive for weakness.  All other systems reviewed and are negative.  Physical Exam Updated Vital Signs BP 98/60 (BP Location: Right Arm)    Pulse (!) 106    Temp 98.6 F (37 C) (Oral)    Resp 16    Ht 5\' 6"  (1.676 m)    Wt 78.9 kg    SpO2 95%    BMI 28.08 kg/m   Physical Exam Vitals and nursing note reviewed.  Constitutional:      Appearance: She is well-developed.  HENT:     Head: Normocephalic and atraumatic.     Comments: Dry mucous membranes Cardiovascular:     Rate and Rhythm: Regular rhythm. Tachycardia present.     Heart sounds: No murmur heard. Pulmonary:     Effort: Pulmonary effort is normal. No respiratory distress.     Breath sounds: Normal breath sounds.  Abdominal:     Palpations: Abdomen is soft.     Tenderness: There is no guarding or rebound.     Comments: Mild right lower quadrant tenderness  Musculoskeletal:        General: No tenderness.     Comments: No significant tenderness to palpation throughout the hips, thighs, knees, distal legs.  Able to passively range bilateral hips and knees.  Patient is unable to fully lift her legs off the stretcher.  Skin:     General: Skin is warm and dry.  Neurological:     Mental Status: She is alert and oriented to person, place, and time.  Psychiatric:        Behavior: Behavior normal.    ED Results / Procedures / Treatments   Labs (all labs ordered are listed, but only abnormal results are displayed) Labs Reviewed  LACTIC ACID, PLASMA - Abnormal; Notable for the following components:      Result Value   Lactic Acid, Venous 8.1 (*)    All other components within normal limits  I-STAT CHEM 8, ED - Abnormal; Notable for the following components:   Creatinine, Ser 1.70 (*)    Glucose, Bld 377 (*)    Calcium, Ion 1.13 (*)    TCO2 16 (*)    Hemoglobin 17.3 (*)    HCT 51.0 (*)    All other components within normal limits  CBG MONITORING, ED - Abnormal; Notable for the following components:   Glucose-Capillary 354 (*)    All other components within normal limits  COMPREHENSIVE METABOLIC PANEL  CBC WITH DIFFERENTIAL/PLATELET  LACTIC ACID, PLASMA  URINALYSIS, ROUTINE W REFLEX MICROSCOPIC  LIPASE, BLOOD  BLOOD GAS, VENOUS    EKG EKG Interpretation  Date/Time:  Friday January 20 2021 05:56:34 EST Ventricular Rate:  131 PR Interval:  107 QRS Duration: 149 QT Interval:  394 QTC Calculation: 582 R Axis:   1 Text Interpretation: Sinus tachycardia Left bundle branch block Confirmed by Quintella Reichert [phone]) on 01/20/2021 6:02:42 AM  Radiology No results found.  Procedures Procedures   Medications Ordered in ED Medications  lactated ringers bolus 1,000 mL (has no administration in time range)  morphine 4 MG/ML injection 4 mg (has no administration in time range)  piperacillin-tazobactam (ZOSYN) IVPB 3.375 g (has no administration in time range)  sodium chloride 0.9 % bolus 1,000  mL (0 mLs Intravenous Paused 01/20/21 0729)  fentaNYL (SUBLIMAZE) injection 50 mcg (50 mcg Intravenous Given 01/20/21 0640)  ondansetron (ZOFRAN) injection 4 mg (4 mg Intravenous Given 01/20/21 5732)    ED Course   I have reviewed the triage vital signs and the nursing notes.  Pertinent labs & imaging results that were available during my care of the patient were reviewed by me and considered in my medical decision making (see chart for details).  Clinical Course as of 01/20/21 0804  Fri Jan 20, 2021  0720 RLQ pain, vomiting, bedbound for 24 hours, syncope upon trying to get up; dehydrated here, pending CTAP [MK]    Clinical Course User Index [MK] Kommor, [place], MD   MDM Rules/Calculators/A&P                           Patient here for evaluation of right lower quadrant abdominal pain, vomiting.  She is tachycardic, dehydrated appearing on evaluation.  No evidence of injury secondary to the fall.  She does have some abdominal tenderness on examination without overt peritoneal findings.  I did treat with IV fluids and obtain a CT scan to further evaluate her symptoms.  Patient care transferred pending further assessment.  Final Clinical Impression(s) / ED Diagnoses Final diagnoses:  None    Rx / DC Orders ED Discharge Orders     None        Quintella Reichert, MD 01/20/21 [phone]

## 2021-01-21 ENCOUNTER — Inpatient Hospital Stay (HOSPITAL_COMMUNITY): Payer: Medicare Other

## 2021-01-21 DIAGNOSIS — N136 Pyonephrosis: Secondary | ICD-10-CM | POA: Diagnosis not present

## 2021-01-21 DIAGNOSIS — E118 Type 2 diabetes mellitus with unspecified complications: Secondary | ICD-10-CM

## 2021-01-21 DIAGNOSIS — I10 Essential (primary) hypertension: Secondary | ICD-10-CM | POA: Diagnosis not present

## 2021-01-21 LAB — CBC
HCT: 36.7 % (ref 36.0–46.0)
HCT: 35.8 % — ABNORMAL LOW (ref 36.0–46.0)
HCT: 37.1 % (ref 36.0–46.0)
Hemoglobin: 11.5 g/dL — ABNORMAL LOW (ref 12.0–15.0)
Hemoglobin: 11.2 g/dL — ABNORMAL LOW (ref 12.0–15.0)
Hemoglobin: 11.6 g/dL — ABNORMAL LOW (ref 12.0–15.0)
MCH: 28 pg (ref 26.0–34.0)
MCH: 28.3 pg (ref 26.0–34.0)
MCH: 28.1 pg (ref 26.0–34.0)
MCHC: 31.3 g/dL (ref 30.0–36.0)
MCHC: 31.3 g/dL (ref 30.0–36.0)
MCHC: 31.3 g/dL (ref 30.0–36.0)
MCV: 89.5 fL (ref 80.0–100.0)
MCV: 90.4 fL (ref 80.0–100.0)
MCV: 89.8 fL (ref 80.0–100.0)
Platelets: 76 10*3/uL — ABNORMAL LOW (ref 150–400)
Platelets: 74 10*3/uL — ABNORMAL LOW (ref 150–400)
Platelets: 76 10*3/uL — ABNORMAL LOW (ref 150–400)
RBC: 4.1 MIL/uL (ref 3.87–5.11)
RBC: 3.96 MIL/uL (ref 3.87–5.11)
RBC: 4.13 MIL/uL (ref 3.87–5.11)
RDW: 14.6 % (ref 11.5–15.5)
RDW: 14.7 % (ref 11.5–15.5)
RDW: 14.7 % (ref 11.5–15.5)
WBC: 12.7 10*3/uL — ABNORMAL HIGH (ref 4.0–10.5)
WBC: 11.1 10*3/uL — ABNORMAL HIGH (ref 4.0–10.5)
WBC: 10.1 10*3/uL (ref 4.0–10.5)
nRBC: 0 % (ref 0.0–0.2)
nRBC: 0 % (ref 0.0–0.2)
nRBC: 0 % (ref 0.0–0.2)

## 2021-01-21 LAB — COMPREHENSIVE METABOLIC PANEL
ALT: 38 U/L (ref 0–44)
AST: 88 U/L — ABNORMAL HIGH (ref 15–41)
Albumin: 2.6 g/dL — ABNORMAL LOW (ref 3.5–5.0)
Alkaline Phosphatase: 85 U/L (ref 38–126)
Anion gap: 6 (ref 5–15)
BUN: 28 mg/dL — ABNORMAL HIGH (ref 8–23)
CO2: 19 mmol/L — ABNORMAL LOW (ref 22–32)
Calcium: 7 mg/dL — ABNORMAL LOW (ref 8.9–10.3)
Chloride: 109 mmol/L (ref 98–111)
Creatinine, Ser: 1.41 mg/dL — ABNORMAL HIGH (ref 0.44–1.00)
GFR, Estimated: 39 mL/min — ABNORMAL LOW (ref >60)
Glucose, Bld: 160 mg/dL — ABNORMAL HIGH (ref 70–99)
Potassium: 4 mmol/L (ref 3.5–5.1)
Sodium: 134 mmol/L — ABNORMAL LOW (ref 135–145)
Total Bilirubin: 0.6 mg/dL (ref 0.3–1.2)
Total Protein: 4.9 g/dL — ABNORMAL LOW (ref 6.5–8.1)

## 2021-01-21 LAB — GLUCOSE, CAPILLARY
Glucose-Capillary: 145 mg/dL — ABNORMAL HIGH (ref 70–99)
Glucose-Capillary: 112 mg/dL — ABNORMAL HIGH (ref 70–99)
Glucose-Capillary: 145 mg/dL — ABNORMAL HIGH (ref 70–99)
Glucose-Capillary: 247 mg/dL — ABNORMAL HIGH (ref 70–99)
Glucose-Capillary: 179 mg/dL — ABNORMAL HIGH (ref 70–99)

## 2021-01-21 LAB — HEMOGLOBIN A1C
Hgb A1c MFr Bld: 10.5 % — ABNORMAL HIGH (ref 4.8–5.6)
Mean Plasma Glucose: 254.65 mg/dL

## 2021-01-21 LAB — CK: Total CK: 3434 U/L — ABNORMAL HIGH (ref 38–234)

## 2021-01-21 LAB — LACTIC ACID, PLASMA
Lactic Acid, Venous: 1.6 mmol/L (ref 0.5–1.9)
Lactic Acid, Venous: 1.8 mmol/L (ref 0.5–1.9)

## 2021-01-21 MED ORDER — FLUMAZENIL 0.5 MG/5ML IV SOLN
0.3000 mg | Freq: Once | INTRAVENOUS | Status: AC
  Administered 2021-01-21: 0.3 mg via INTRAVENOUS
  Filled 2021-01-21: qty 5

## 2021-01-21 MED ORDER — ALBUMIN HUMAN 5 % IV SOLN
12.5000 g | Freq: Once | INTRAVENOUS | Status: AC
  Administered 2021-01-21: 12.5 g via INTRAVENOUS
  Filled 2021-01-21: qty 250

## 2021-01-21 MED ORDER — LACTATED RINGERS IV BOLUS
1000.0000 mL | Freq: Once | INTRAVENOUS | Status: AC
  Administered 2021-01-21: 1000 mL via INTRAVENOUS

## 2021-01-21 NOTE — Progress Notes (Signed)
Pt was hypotensive whiles sleeping heavily with bps 60/40s. Pt had received  1mg  artivan po and 25mg  lyrica.Flumazenil had no reversal effect on patient. 1 liter LR and albumin given. Marginal improvement in blood pressure. Pt is arousable and able to communicate needs." I needed this sleep badly"

## 2021-01-21 NOTE — TOC Progression Note (Signed)
Transition of Care [facility]) - Progression Note    Patient Details  Name: Mariah Reed MRN: [id number] Date of Birth: 1945/10/13  [facility] CM/SW Contact  Purcell Mouton, RN Phone Number: 01/21/2021, 3:11 PM  Clinical Narrative:    TOC will continue to follow pt for discharge needs.   Expected Discharge Plan: [place] Barriers to Discharge: No Barriers Identified  Expected Discharge Plan and Services Expected Discharge Plan: [place] arrangements for the past 2 months: Single Family Home                                       Social Determinants of Health (SDOH) Interventions    Readmission Risk Interventions No flowsheet data found.

## 2021-01-21 NOTE — Progress Notes (Signed)
Patient has red cellphone, phone charger and upper and lower dentures at bedside.

## 2021-01-21 NOTE — Progress Notes (Signed)
PROGRESS NOTE    Mariah Reed  HCW:[id number] DOB: 1945-02-14 DOA: 01/20/2021 PCP: Gladstone Lighter, MD    Brief Narrative:  75 y.o. female with medical history significant of seasonal allergies, COPD, type II DM, gout, hyperlipidemia, hypertension, chronic lower back pain, peripheral neuropathy who is coming from [facility] ED to the emergency department due to severe sepsis in the setting of acute emphysematous pyelonephritis  Assessment & Plan:   Principal Problem:   Acute pyonephrosis Active Problems:   Diabetes mellitus type 2 with complications ([place])   HLD (hyperlipidemia)   GOUT   Essential hypertension   COPD (chronic obstructive pulmonary disease) ([place])   Gastroesophageal reflux disease without esophagitis   Hypothyroidism   Severe sepsis (HCC)   Hydronephrosis of right kidney   Hydroureter on right   Rhabdomyolysis  Principal Problem:   Severe sepsis POA secondary to Acute pyonephrosis with Hydronephrosis of right kidney and Hydroureter on right Status post retrogrades cystoscopy, Staghorn stone removal and stent placement. Continue Zosyn every 8 hours. Blood cultures negative thus far Urine cultures are pending Urology consult and intervention appreciated.   Active Problems:   Rhabdomyolysis Continue IV fluids. CK has trended up to 3434 Hold statin. Continue to follow CK trends     Diabetes mellitus type 2 with complications (HCC) Carbohydrate modified diet.     HLD (hyperlipidemia) Hold lovastatin as per above     GOUT Continue allopurinol.     Essential hypertension Continue Procardia XL 60 mg p.o. daily. Continue metoprolol tartrate 12.5 mg p.o. twice daily as needed. Blood pressure stable and controlled at time     COPD (chronic obstructive pulmonary disease) ([place]) On room air Albuterol MDI as needed.     Gastroesophageal reflux disease without esophagitis Continue PPI.     Hypothyroidism Continue levothyroxine 75 mcg p.o.  daily.    DVT prophylaxis: SCD's Code Status: Full Family Communication: Pt in room, family not at bedside  Status is: Inpatient  Remains inpatient appropriate because: Severity of illness    Consultants:  Urology  Procedures:  Cystoscopy with retrograde pyelogram and stent placement  Antimicrobials: Anti-infectives (From admission, onward)    Start     Dose/Rate Route Frequency Ordered Stop   01/20/21 1800  piperacillin-tazobactam (ZOSYN) IVPB 3.375 g        3.375 g 12.5 mL/hr over 240 Minutes Intravenous Every 8 hours 01/20/21 1605     01/20/21 1045  piperacillin-tazobactam (ZOSYN) IVPB 3.375 g        3.375 g 12.5 mL/hr over 240 Minutes Intravenous NOW 01/20/21 1038 01/20/21 1558   01/20/21 0800  piperacillin-tazobactam (ZOSYN) IVPB 3.375 g        3.375 g 12.5 mL/hr over 240 Minutes Intravenous Once 01/20/21 0759 01/20/21 0940       Subjective: Complaining of neuropathy pains this AM  Objective: Vitals:   01/21/21 1200 01/21/21 1300 01/21/21 1400 01/21/21 1605  BP: (!) 109/48 134/87 (!) 164/80   Pulse: 87 66 92   Resp: (!) 31 16 (!) 21   Temp:    99.6 F (37.6 C)  TempSrc:    Oral  SpO2: 91% 100% 95%   Weight:      Height:        Intake/Output Summary (Last 24 hours) at 01/21/2021 1628 Last data filed at 01/21/2021 1400 Gross per 24 hour  Intake 3159.71 ml  Output 910 ml  Net 2249.71 ml   Filed Weights   01/20/21 0600 01/20/21 1021 01/20/21 1151  Weight: 78.9 kg 79.4 kg 77.1 kg    Examination: General exam: Awake, laying in bed, in nad Respiratory system: Normal respiratory effort, no wheezing Cardiovascular system: regular rate, s1, s2 Gastrointestinal system: Soft, nondistended, positive BS Central nervous system: CN2-12 grossly intact, strength intact Extremities: Perfused, no clubbing Skin: Normal skin turgor, no notable skin lesions seen Psychiatry: Mood normal // no visual hallucinations   Data Reviewed: I have personally reviewed  following labs and imaging studies  CBC: Recent Labs  Lab 01/20/21 0655 01/20/21 0830 01/21/21 0149 01/21/21 0359 01/21/21 0816  WBC  --  17.9* 12.7* 11.1* 10.1  NEUTROABS  --  16.6*  --   --   --   HGB 17.3* 14.6 11.5* 11.2* 11.6*  HCT 51.0* 45.5 36.7 35.8* 37.1  MCV  --  89.4 89.5 90.4 89.8  PLT  --  130* 76* 74* 76*   Basic Metabolic Panel: Recent Labs  Lab 01/20/21 0655 01/20/21 0830 01/21/21 0149  NA 136 134* 134*  K 3.6 3.2* 4.0  CL 105 106 109  CO2  --  17* 19*  GLUCOSE 377* 327* 160*  BUN 22 20 28*  CREATININE 1.70* 1.73* 1.41*  CALCIUM  --  8.1* 7.0*   GFR: Estimated Creatinine Clearance: 34.8 mL/min (A) (by C-G formula based on SCr of 1.41 mg/dL (H)). Liver Function Tests: Recent Labs  Lab 01/20/21 0830 01/21/21 0149  AST 58* 88*  ALT 43 38  ALKPHOS 130* 85  BILITOT 0.6 0.6  PROT 5.9* 4.9*  ALBUMIN 3.1* 2.6*   Recent Labs  Lab 01/20/21 0830  LIPASE 18   No results for input(s): AMMONIA in the last 168 hours. Coagulation Profile: Recent Labs  Lab 01/20/21 0830  INR 1.2   Cardiac Enzymes: Recent Labs  Lab 01/20/21 0830 01/21/21 0149  CKTOTAL 1,774* 3,434*   BNP (last 3 results) No results for input(s): PROBNP in the last 8760 hours. HbA1C: Recent Labs    01/21/21 0149  HGBA1C 10.5*   CBG: Recent Labs  Lab 01/20/21 2123 01/21/21 0138 01/21/21 0812 01/21/21 1223 01/21/21 1602  GLUCAP 189* 145* 112* 145* 247*   Lipid Profile: No results for input(s): CHOL, HDL, LDLCALC, TRIG, CHOLHDL, LDLDIRECT in the last 72 hours. Thyroid Function Tests: No results for input(s): TSH, T4TOTAL, FREET4, T3FREE, THYROIDAB in the last 72 hours. Anemia Panel: No results for input(s): VITAMINB12, FOLATE, FERRITIN, TIBC, IRON, RETICCTPCT in the last 72 hours. Sepsis Labs: Recent Labs  Lab 01/20/21 0635 01/20/21 0830 01/21/21 0149 01/21/21 0359  LATICACIDVEN 8.1* 4.1* 1.6 1.8    Recent Results (from the past 240 hour(s))  Blood Culture  (routine x 2)     Status: None (Preliminary result)   Collection Time: 01/20/21  8:10 AM   Specimen: BLOOD  Result Value Ref Range Status   Specimen Description BLOOD BLOOD LEFT HAND  Final   Special Requests   Final    BOTTLES DRAWN AEROBIC AND ANAEROBIC Blood Culture results may not be optimal due to an excessive volume of blood received in culture bottles   Culture   Final    NO GROWTH 1 DAY Performed at [facility], [place] [address], [place], [place] [address]    Report Status PENDING  Incomplete  Resp Panel by RT-PCR (Flu A&B, Covid) Nasopharyngeal Swab     Status: None   Collection Time: 01/20/21  8:14 AM   Specimen: Nasopharyngeal Swab; Nasopharyngeal(NP) swabs in vial transport medium  Result Value Ref Range Status  SARS Coronavirus 2 by RT PCR NEGATIVE NEGATIVE Final    Comment: (NOTE) SARS-CoV-2 target nucleic acids are NOT DETECTED.  The SARS-CoV-2 RNA is generally detectable in upper respiratory specimens during the acute phase of infection. The lowest concentration of SARS-CoV-2 viral copies this assay can detect is 138 copies/mL. A negative result does not preclude SARS-Cov-2 infection and should not be used as the sole basis for treatment or other patient management decisions. A negative result may occur with  improper specimen collection/handling, submission of specimen other than nasopharyngeal swab, presence of viral mutation(s) within the areas targeted by this assay, and inadequate number of viral copies(<138 copies/mL). A negative result must be combined with clinical observations, patient history, and epidemiological information. The expected result is Negative.  Fact Sheet for Patients:  EntrepreneurPulse.com.au  Fact Sheet for Healthcare Providers:  IncredibleEmployment.be  This test is no t yet approved or cleared by the [place] FDA and  has been authorized for detection and/or diagnosis of SARS-CoV-2  by FDA under an Emergency Use Authorization (EUA). This EUA will remain  in effect (meaning this test can be used) for the duration of the COVID-19 declaration under Section 564(b)(1) of the Act, 21 U.S.C.section 360bbb-3(b)(1), unless the authorization is terminated  or revoked sooner.       Influenza A by PCR NEGATIVE NEGATIVE Final   Influenza B by PCR NEGATIVE NEGATIVE Final    Comment: (NOTE) The Xpert Xpress SARS-CoV-2/FLU/RSV plus assay is intended as an aid in the diagnosis of influenza from Nasopharyngeal swab specimens and should not be used as a sole basis for treatment. Nasal washings and aspirates are unacceptable for Xpert Xpress SARS-CoV-2/FLU/RSV testing.  Fact Sheet for Patients: EntrepreneurPulse.com.au  Fact Sheet for Healthcare Providers: IncredibleEmployment.be  This test is not yet approved or cleared by the [place] FDA and has been authorized for detection and/or diagnosis of SARS-CoV-2 by FDA under an Emergency Use Authorization (EUA). This EUA will remain in effect (meaning this test can be used) for the duration of the COVID-19 declaration under Section 564(b)(1) of the Act, 21 U.S.C. section 360bbb-3(b)(1), unless the authorization is terminated or revoked.  Performed at [facility], [place] [address] [place], [place] [address]   Blood Culture (routine x 2)     Status: None (Preliminary result)   Collection Time: 01/20/21 10:50 AM   Specimen: BLOOD  Result Value Ref Range Status   Specimen Description   Final    BLOOD RIGHT ANTECUBITAL Performed at [place] [address], [place], [place] [address]    Special Requests   Final    BOTTLES DRAWN AEROBIC AND ANAEROBIC Blood Culture adequate volume Performed at [place] [address], [place], [place] [address]    Culture   Final    NO GROWTH 1 DAY Performed at [facility], [place] [address],  [place], [place] [address]    Report Status PENDING  Incomplete  MRSA Next Gen by PCR, Nasal     Status: None   Collection Time: 01/20/21  4:04 PM   Specimen: Nasal Mucosa; Nasal Swab  Result Value Ref Range Status   MRSA by PCR Next Gen NOT DETECTED NOT DETECTED Final    Comment: (NOTE) The GeneXpert MRSA Assay (FDA approved for NASAL specimens only), is one component of a comprehensive MRSA colonization surveillance program. It is not intended to diagnose MRSA infection nor to guide or monitor treatment for MRSA infections. Test performance is  not FDA approved in patients less than 74 years old. Performed at [facility], [place] [address], [place], [place] [address]      Radiology Studies: CT ABDOMEN PELVIS WO CONTRAST  Result Date: 01/21/2021 CLINICAL DATA:  Hydronephrosis. EXAM: CT ABDOMEN AND PELVIS WITHOUT CONTRAST TECHNIQUE: Multidetector CT imaging of the abdomen and pelvis was performed following the standard protocol without IV contrast. COMPARISON:  CT abdomen and pelvis 01/20/2021. FINDINGS: Lower chest: There is new atelectasis in the bilateral lower lobes. Hepatobiliary: Gallbladder surgically absent. Common bile duct is dilated, but stable in size. Liver is within normal limits. Pancreas: Atrophic, unchanged. Spleen: Normal in size without focal abnormality. Adrenals/Urinary Tract: A new right ureteral stent is in place. There is no hydronephrosis. No ureteral calculi are seen. 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 There is a small amount of air in the right renal pelvis and collecting system which has slightly decreased. Right perinephric and Peri ureteral stranding is present, but has decreased. There is a stable mildly hyperdense low-attenuation area in the superior pole the left kidney measuring 3.2 cm. There are additional hypodensities in the kidneys which are too small to characterize. There is no left-sided  hydronephrosis or urinary tract calculus. The bladder is decompressed by Foley catheter. The adrenal glands are within normal limits. Stomach/Bowel: Stomach is within normal limits. No evidence of bowel wall thickening, distention, or inflammatory changes. The appendix is not visualized. Vascular/Lymphatic: Aortic atherosclerosis. No enlarged abdominal or pelvic lymph nodes. Reproductive: Status post hysterectomy. No adnexal masses. Other: There is trace free fluid in the pelvis. There is no abdominal wall hernia. Musculoskeletal: No acute or significant osseous findings. IMPRESSION: 1. New right ureteral stent in place. No hydronephrosis. There is a small amount of air in the right renal collecting system and pelvis which has decreased from prior. Infection not excluded. 2. Stable right staghorn calculus.  No right ureteral calculi. 3. Right perinephric and Periureteral stranding has decreased. 4. Stable lesion in the superior pole the left kidney, likely a proteinaceous cyst. 5.  Aortic Atherosclerosis (ICD10-I70.0). Electronically Signed   By: Ronney Asters M.D.   On: 01/21/2021 15:13   CT Abdomen Pelvis Wo Contrast  Result Date: 01/20/2021 CLINICAL DATA:  75 year old female with history of right lower quadrant abdominal pain. Suspected bowel obstruction. EXAM: CT ABDOMEN AND PELVIS WITHOUT CONTRAST TECHNIQUE: Multidetector CT imaging of the abdomen and pelvis was performed following the standard protocol without IV contrast. COMPARISON:  CT of the abdomen and pelvis 07/28/2020. FINDINGS: Lower chest: Aortic atherosclerosis. Patchy areas of ground-glass attenuation and septal thickening noted in the visualized lung bases. Hepatobiliary: No definite suspicious cystic or solid hepatic lesions are confidently identified on today's noncontrast CT examination. Status post cholecystectomy. Common bile duct is dilated measuring up to 14 mm in the porta hepatis, similar to prior examination from 07/28/2020, likely  reflective of benign post cholecystectomy physiology. Pancreas: Diffuse fatty atrophy in the pancreas. No definite pancreatic mass or peripancreatic fluid collections or inflammatory changes are confidently identified on today's noncontrast CT examination. Spleen: Tiny splenule inferior to the spleen. Otherwise, unremarkable. Adrenals/Urinary Tract: Large staghorn calculus again noted in the lower pole collecting system of the right kidney extending into the right renal pelvis estimated to measure approximately 4.0 x 1.7 x 1.9 cm. In addition, in the distal third of the right ureter at or immediately before the right ureterovesicular junction (axial image 79 of series 3) there is a 3 mm  obstructing calculus which is associated with mild proximal right hydroureteronephrosis. Extensive right-sided perinephric stranding is noted, out of proportion to the degree of right-sided hydroureteronephrosis, suggestive of recent rupture of a calyx. There is some fluid tracking caudally throughout the right retroperitoneal space and right pericolic gutter. Notably, there is also gas present within the right renal collecting system, right ureter and non dependent portion of the urinary bladder. Unenhanced appearance of the left kidney and bilateral adrenal glands is normal. Urinary bladder is otherwise grossly normal in appearance. Stomach/Bowel: The unenhanced appearance of the stomach is normal. No pathologic dilatation of small bowel or colon. The appendix is not confidently identified and may be surgically absent. Regardless, there are no inflammatory changes noted adjacent to the cecum to suggest the presence of an acute appendicitis at this time. Vascular/Lymphatic: Atherosclerotic calcifications in the abdominal aorta and pelvic vasculature. No lymphadenopathy noted in the abdomen or pelvis. Reproductive: Status post hysterectomy.  Ovaries are atrophic. Other: No significant volume of ascites.  No pneumoperitoneum.  Musculoskeletal: There are no aggressive appearing lytic or blastic lesions noted in the visualized portions of the skeleton. IMPRESSION: 1. 3 mm obstructing calculus at the right ureterovesicular junction with mild proximal right hydroureteronephrosis, but extensive right-sided perinephric stranding out of proportion to the degree of apparent obstruction, suggesting rupture of a calyx. Notably, there is also extensive gas throughout the urinary bladder, right ureter and right renal collecting system, suggesting right-sided pyonephrosis. There is also a large staghorn calculus in the lower pole collecting system of the right kidney which extends into the right renal pelvis. Urologic consultation is strongly recommended. 2. Aortic atherosclerosis. 3. Findings in the lung bases which could be indicative of early or mild interstitial lung disease. Nonemergent outpatient follow-up high-resolution chest CT is recommended in the near future to better evaluate these findings. 4. Additional incidental findings, as above. Electronically Signed   By: Vinnie Langton M.D.   On: 01/20/2021 08:20   DG Chest Port 1 View  Result Date: 01/20/2021 CLINICAL DATA:  Questionable sepsis, RIGHT lower quadrant abdominal pain since last night, weakness, COPD, hypertension EXAM: PORTABLE CHEST 1 VIEW COMPARISON:  Portable exam 0834 hours compared to 10/29/2012 FINDINGS: Normal heart size and pulmonary vascularity. Mediastinal contours grossly normal for positioning and scoliosis. Questionable LEFT perihilar infiltrate. Remaining lungs clear. No pleural effusion or pneumothorax. Mild biconvex thoracolumbar scoliosis. IMPRESSION: Questionable LEFT perihilar infiltrate Electronically Signed   By: Lavonia Dana M.D.   On: 01/20/2021 08:52   DG C-Arm 1-60 Min-No Report  Result Date: 01/20/2021 Fluoroscopy was utilized by the requesting physician.  No radiographic interpretation.   VAS [place] LOWER EXTREMITY VENOUS (DVT)  Result Date:  01/21/2021  Lower Venous DVT Study Patient Name:  Mariah Reed  Date of Exam:   01/20/2021 Medical Rec #: [id number]        Accession #:    [id number] Date of Birth: 1945-05-09         Patient Gender: F Patient Age:   32 years Exam Location:  [facility] Procedure:      VAS [place] LOWER EXTREMITY VENOUS (DVT) Referring Phys: DAVID ORTIZ --------------------------------------------------------------------------------  Indications: Swelling.  Risk Factors: None identified. Limitations: Poor ultrasound/tissue interface and patient pain tolerance. Comparison Study: No prior studies. Performing Technologist: Oliver Hum RVT  Examination Guidelines: A complete evaluation includes B-mode imaging, spectral Doppler, color Doppler, and power Doppler as needed of all accessible portions of each vessel. Bilateral testing is considered an integral part of a complete  examination. Limited examinations for reoccurring indications may be performed as noted. The reflux portion of the exam is performed with the patient in reverse Trendelenburg.  +---------+---------------+---------+-----------+----------+--------------+  RIGHT     Compressibility Phasicity Spontaneity Properties Thrombus Aging  +---------+---------------+---------+-----------+----------+--------------+  CFV       Full            Yes       Yes                                    +---------+---------------+---------+-----------+----------+--------------+  SFJ       Full                                                             +---------+---------------+---------+-----------+----------+--------------+  FV Prox   Full                                                             +---------+---------------+---------+-----------+----------+--------------+  FV Mid    Full                                                             +---------+---------------+---------+-----------+----------+--------------+  FV Distal Full                                                              +---------+---------------+---------+-----------+----------+--------------+  PFV       Full                                                             +---------+---------------+---------+-----------+----------+--------------+  POP       Full            Yes       Yes                                    +---------+---------------+---------+-----------+----------+--------------+  PTV       Full                                                             +---------+---------------+---------+-----------+----------+--------------+  PERO      Full                                                             +---------+---------------+---------+-----------+----------+--------------+   +---------+---------------+---------+-----------+----------+--------------+  LEFT      Compressibility Phasicity Spontaneity Properties Thrombus Aging  +---------+---------------+---------+-----------+----------+--------------+  CFV       Full            Yes       Yes                                    +---------+---------------+---------+-----------+----------+--------------+  SFJ       Full                                                             +---------+---------------+---------+-----------+----------+--------------+  FV Prox   Full                                                             +---------+---------------+---------+-----------+----------+--------------+  FV Mid    Full                                                             +---------+---------------+---------+-----------+----------+--------------+  FV Distal                 Yes       Yes                                    +---------+---------------+---------+-----------+----------+--------------+  PFV       Full                                                             +---------+---------------+---------+-----------+----------+--------------+  POP       Full            Yes       Yes                                     +---------+---------------+---------+-----------+----------+--------------+  PTV       Full                                                             +---------+---------------+---------+-----------+----------+--------------+  PERO      Full                                                             +---------+---------------+---------+-----------+----------+--------------+  Summary: RIGHT: - There is no evidence of deep vein thrombosis in the lower extremity. However, portions of this examination were limited- see technologist comments above.  - No cystic structure found in the popliteal fossa.  LEFT: - There is no evidence of deep vein thrombosis in the lower extremity. However, portions of this examination were limited- see technologist comments above.  - No cystic structure found in the popliteal fossa.  *See table(s) above for measurements and observations. Electronically signed by Orlie Pollen on 01/21/2021 at 10:28:34 AM.    Final     Scheduled Meds:  alum & mag hydroxide-simeth  30 mL Oral BID   Chlorhexidine Gluconate Cloth  6 each Topical Daily   diclofenac  2 patch Transdermal BID   diphenoxylate-atropine  2 tablet Oral BID   insulin aspart  0-15 Units Subcutaneous TID WC   levothyroxine  75 mcg Oral QAC breakfast   lipase/protease/amylase  72,000 Units Oral TID with meals   NIFEdipine  60 mg Oral Daily   pantoprazole  40 mg Oral Daily   pregabalin  25 mg Oral Once   pregabalin  25 mg Oral BID   tiZANidine  4 mg Oral QHS   topiramate  100 mg Oral q morning   umeclidinium bromide  1 puff Inhalation Daily   Continuous Infusions:  0.9 % NaCl with KCl 20 mEq / L 125 mL/hr at 01/21/21 1400   piperacillin-tazobactam 3.375 g (01/21/21 1503)     LOS: 1 day   Marylu Lund, MD [facility] Pager On Amion  If 7PM-7AM, please contact night-coverage 01/21/2021, 4:28 PM

## 2021-01-21 NOTE — Progress Notes (Signed)
1 Day Post-Op Subjective: Denies abdominal or flank pain. Denies nausea or emesis. Tolerating foley. Remains afebrile.  Objective: Vital signs in last 24 hours: Temp:  [98.6 F (37 C)-99.2 F (37.3 C)] 98.9 F (37.2 C) (12/17 0858) Pulse Rate:  [74-104] 85 (12/17 1000) Resp:  [15-36] 17 (12/17 1000) BP: (57-148)/(29-98) 130/59 (12/17 1000) SpO2:  [80 %-100 %] 98 % (12/17 1000) Weight:  [77.1 kg] 77.1 kg (12/16 1151)  Intake/Output from previous day: 12/16 0701 - 12/17 0700 In: 4158.9 [I.V.:2519.1; IV Piggyback:1639.7] Out: 910 [Urine:910] Intake/Output this shift: Total I/O In: 545.4 [I.V.:495.4; IV Piggyback:50] Out: -  UOP: 941ml clear yellow  Physical Exam:  General: Alert and oriented CV: RRR Lungs: Clear Abdomen: Soft, ND, NT, No CVA tenderness Ext: NT, No erythema  Lab Results: Recent Labs    01/21/21 0149 01/21/21 0359 01/21/21 0816  HGB 11.5* 11.2* 11.6*  HCT 36.7 35.8* 37.1   BMET Recent Labs    01/20/21 0830 01/21/21 0149  NA 134* 134*  K 3.2* 4.0  CL 106 109  CO2 17* 19*  GLUCOSE 327* 160*  BUN 20 28*  CREATININE 1.73* 1.41*  CALCIUM 8.1* 7.0*     Studies/Results: CT Abdomen Pelvis Wo Contrast  Result Date: 01/20/2021 CLINICAL DATA:  75 year old female with history of right lower quadrant abdominal pain. Suspected bowel obstruction. EXAM: CT ABDOMEN AND PELVIS WITHOUT CONTRAST TECHNIQUE: Multidetector CT imaging of the abdomen and pelvis was performed following the standard protocol without IV contrast. COMPARISON:  CT of the abdomen and pelvis 07/28/2020. FINDINGS: Lower chest: Aortic atherosclerosis. Patchy areas of ground-glass attenuation and septal thickening noted in the visualized lung bases. Hepatobiliary: No definite suspicious cystic or solid hepatic lesions are confidently identified on today's noncontrast CT examination. Status post cholecystectomy. Common bile duct is dilated measuring up to 14 mm in the porta hepatis, similar to  prior examination from 07/28/2020, likely reflective of benign post cholecystectomy physiology. Pancreas: Diffuse fatty atrophy in the pancreas. No definite pancreatic mass or peripancreatic fluid collections or inflammatory changes are confidently identified on today's noncontrast CT examination. Spleen: Tiny splenule inferior to the spleen. Otherwise, unremarkable. Adrenals/Urinary Tract: Large staghorn calculus again noted in the lower pole collecting system of the right kidney extending into the right renal pelvis estimated to measure approximately 4.0 x 1.7 x 1.9 cm. In addition, in the distal third of the right ureter at or immediately before the right ureterovesicular junction (axial image 79 of series 3) there is a 3 mm obstructing calculus which is associated with mild proximal right hydroureteronephrosis. Extensive right-sided perinephric stranding is noted, out of proportion to the degree of right-sided hydroureteronephrosis, suggestive of recent rupture of a calyx. There is some fluid tracking caudally throughout the right retroperitoneal space and right pericolic gutter. Notably, there is also gas present within the right renal collecting system, right ureter and non dependent portion of the urinary bladder. Unenhanced appearance of the left kidney and bilateral adrenal glands is normal. Urinary bladder is otherwise grossly normal in appearance. Stomach/Bowel: The unenhanced appearance of the stomach is normal. No pathologic dilatation of small bowel or colon. The appendix is not confidently identified and may be surgically absent. Regardless, there are no inflammatory changes noted adjacent to the cecum to suggest the presence of an acute appendicitis at this time. Vascular/Lymphatic: Atherosclerotic calcifications in the abdominal aorta and pelvic vasculature. No lymphadenopathy noted in the abdomen or pelvis. Reproductive: Status post hysterectomy.  Ovaries are atrophic. Other: No significant volume  of  ascites.  No pneumoperitoneum. Musculoskeletal: There are no aggressive appearing lytic or blastic lesions noted in the visualized portions of the skeleton. IMPRESSION: 1. 3 mm obstructing calculus at the right ureterovesicular junction with mild proximal right hydroureteronephrosis, but extensive right-sided perinephric stranding out of proportion to the degree of apparent obstruction, suggesting rupture of a calyx. Notably, there is also extensive gas throughout the urinary bladder, right ureter and right renal collecting system, suggesting right-sided pyonephrosis. There is also a large staghorn calculus in the lower pole collecting system of the right kidney which extends into the right renal pelvis. Urologic consultation is strongly recommended. 2. Aortic atherosclerosis. 3. Findings in the lung bases which could be indicative of early or mild interstitial lung disease. Nonemergent outpatient follow-up high-resolution chest CT is recommended in the near future to better evaluate these findings. 4. Additional incidental findings, as above. Electronically Signed   By: Vinnie Langton M.D.   On: 01/20/2021 08:20   DG Chest Port 1 View  Result Date: 01/20/2021 CLINICAL DATA:  Questionable sepsis, RIGHT lower quadrant abdominal pain since last night, weakness, COPD, hypertension EXAM: PORTABLE CHEST 1 VIEW COMPARISON:  Portable exam 0834 hours compared to 10/29/2012 FINDINGS: Normal heart size and pulmonary vascularity. Mediastinal contours grossly normal for positioning and scoliosis. Questionable LEFT perihilar infiltrate. Remaining lungs clear. No pleural effusion or pneumothorax. Mild biconvex thoracolumbar scoliosis. IMPRESSION: Questionable LEFT perihilar infiltrate Electronically Signed   By: Lavonia Dana M.D.   On: 01/20/2021 08:52   DG C-Arm 1-60 Min-No Report  Result Date: 01/20/2021 Fluoroscopy was utilized by the requesting physician.  No radiographic interpretation.   VAS [place] LOWER  EXTREMITY VENOUS (DVT)  Result Date: 01/21/2021  Lower Venous DVT Study Patient Name:  DALISA FORRER  Date of Exam:   01/20/2021 Medical Rec #: [id number]        Accession #:    [id number] Date of Birth: March 29, 1945         Patient Gender: F Patient Age:   75 years Exam Location:  [facility] Procedure:      VAS [place] LOWER EXTREMITY VENOUS (DVT) Referring Phys: DAVID ORTIZ --------------------------------------------------------------------------------  Indications: Swelling.  Risk Factors: None identified. Limitations: Poor ultrasound/tissue interface and patient pain tolerance. Comparison Study: No prior studies. Performing Technologist: Oliver Hum RVT  Examination Guidelines: A complete evaluation includes B-mode imaging, spectral Doppler, color Doppler, and power Doppler as needed of all accessible portions of each vessel. Bilateral testing is considered an integral part of a complete examination. Limited examinations for reoccurring indications may be performed as noted. The reflux portion of the exam is performed with the patient in reverse Trendelenburg.  +---------+---------------+---------+-----------+----------+--------------+  RIGHT     Compressibility Phasicity Spontaneity Properties Thrombus Aging  +---------+---------------+---------+-----------+----------+--------------+  CFV       Full            Yes       Yes                                    +---------+---------------+---------+-----------+----------+--------------+  SFJ       Full                                                             +---------+---------------+---------+-----------+----------+--------------+  FV Prox   Full                                                             +---------+---------------+---------+-----------+----------+--------------+  FV Mid    Full                                                             +---------+---------------+---------+-----------+----------+--------------+  FV Distal Full                                                              +---------+---------------+---------+-----------+----------+--------------+  PFV       Full                                                             +---------+---------------+---------+-----------+----------+--------------+  POP       Full            Yes       Yes                                    +---------+---------------+---------+-----------+----------+--------------+  PTV       Full                                                             +---------+---------------+---------+-----------+----------+--------------+  PERO      Full                                                             +---------+---------------+---------+-----------+----------+--------------+   +---------+---------------+---------+-----------+----------+--------------+  LEFT      Compressibility Phasicity Spontaneity Properties Thrombus Aging  +---------+---------------+---------+-----------+----------+--------------+  CFV       Full            Yes       Yes                                    +---------+---------------+---------+-----------+----------+--------------+  SFJ       Full                                                             +---------+---------------+---------+-----------+----------+--------------+  FV Prox   Full                                                             +---------+---------------+---------+-----------+----------+--------------+  FV Mid    Full                                                             +---------+---------------+---------+-----------+----------+--------------+  FV Distal                 Yes       Yes                                    +---------+---------------+---------+-----------+----------+--------------+  PFV       Full                                                             +---------+---------------+---------+-----------+----------+--------------+  POP       Full            Yes       Yes                                     +---------+---------------+---------+-----------+----------+--------------+  PTV       Full                                                             +---------+---------------+---------+-----------+----------+--------------+  PERO      Full                                                             +---------+---------------+---------+-----------+----------+--------------+     Summary: RIGHT: - There is no evidence of deep vein thrombosis in the lower extremity. However, portions of this examination were limited- see technologist comments above.  - No cystic structure found in the popliteal fossa.  LEFT: - There is no evidence of deep vein thrombosis in the lower extremity. However, portions of this examination were limited- see technologist comments above.  - No cystic structure found in the popliteal fossa.  *See table(s) above for measurements and observations. Electronically signed by Orlie Pollen on 01/21/2021 at 10:28:34 AM.    Final     Assessment/Plan: Right emphysematous pyelitis with pyonephrosis: CT A/P 01/20/2021 with 3 mm obstructing right distal ureteral stone, gas in the collecting system, large staghorn stone in the  right lower pole extending the collecting system with gas in the right lower pole, significant right-sided perinephric stranding suggesting rupture of a calyx.  Afebrile, WBC 17.9, creatinine 1.7, urinalysis indicating infection. S/p cysto, R RPG, R stent 01/20/2021. AKI: Creatinine 1.7 from baseline of 1 at time of consultation. Improving Distal right ureteral and large staghorn right renal stone (4x1.7x1.9cm)  -She is comfortable, hemodynamically appropriate on rounds this AM. Remains afebrile. -Pain control prn -Leave foley in today for maximal decompression. Likely void trial tomorrow -Continue broad spectrum antibiotics -F/u urine culture -Will plan for interval CT imaging tomorrow to evaluate for resolution of gas in collecting system -Discussed that she will  require PCNL to treat her large 4cm staghorn stone. This will be done outpatient. Arranged followup. -Following   LOS: 1 day   Matt R. Pandora Mccrackin MD 01/21/2021, 10:31 AM [facility]  Pager: [phone]

## 2021-01-22 DIAGNOSIS — N136 Pyonephrosis: Secondary | ICD-10-CM | POA: Diagnosis not present

## 2021-01-22 DIAGNOSIS — I10 Essential (primary) hypertension: Secondary | ICD-10-CM | POA: Diagnosis not present

## 2021-01-22 DIAGNOSIS — E118 Type 2 diabetes mellitus with unspecified complications: Secondary | ICD-10-CM | POA: Diagnosis not present

## 2021-01-22 LAB — COMPREHENSIVE METABOLIC PANEL
ALT: 36 U/L (ref 0–44)
AST: 64 U/L — ABNORMAL HIGH (ref 15–41)
Albumin: 2.8 g/dL — ABNORMAL LOW (ref 3.5–5.0)
Alkaline Phosphatase: 145 U/L — ABNORMAL HIGH (ref 38–126)
Anion gap: 5 (ref 5–15)
BUN: 24 mg/dL — ABNORMAL HIGH (ref 8–23)
CO2: 19 mmol/L — ABNORMAL LOW (ref 22–32)
Calcium: 7.4 mg/dL — ABNORMAL LOW (ref 8.9–10.3)
Chloride: 111 mmol/L (ref 98–111)
Creatinine, Ser: 1 mg/dL (ref 0.44–1.00)
GFR, Estimated: 58 mL/min — ABNORMAL LOW (ref >60)
Glucose, Bld: 186 mg/dL — ABNORMAL HIGH (ref 70–99)
Potassium: 4.7 mmol/L (ref 3.5–5.1)
Sodium: 135 mmol/L (ref 135–145)
Total Bilirubin: 0.6 mg/dL (ref 0.3–1.2)
Total Protein: 5.4 g/dL — ABNORMAL LOW (ref 6.5–8.1)

## 2021-01-22 LAB — GLUCOSE, CAPILLARY
Glucose-Capillary: 157 mg/dL — ABNORMAL HIGH (ref 70–99)
Glucose-Capillary: 180 mg/dL — ABNORMAL HIGH (ref 70–99)
Glucose-Capillary: 211 mg/dL — ABNORMAL HIGH (ref 70–99)
Glucose-Capillary: 206 mg/dL — ABNORMAL HIGH (ref 70–99)

## 2021-01-22 LAB — CBC
HCT: 37.2 % (ref 36.0–46.0)
Hemoglobin: 11.4 g/dL — ABNORMAL LOW (ref 12.0–15.0)
MCH: 27.9 pg (ref 26.0–34.0)
MCHC: 30.6 g/dL (ref 30.0–36.0)
MCV: 91 fL (ref 80.0–100.0)
Platelets: 86 10*3/uL — ABNORMAL LOW (ref 150–400)
RBC: 4.09 MIL/uL (ref 3.87–5.11)
RDW: 14.6 % (ref 11.5–15.5)
WBC: 10.2 10*3/uL (ref 4.0–10.5)
nRBC: 0 % (ref 0.0–0.2)

## 2021-01-22 LAB — CK: Total CK: 1445 U/L — ABNORMAL HIGH (ref 38–234)

## 2021-01-22 MED ORDER — OXYCODONE HCL 5 MG PO TABS
5.0000 mg | ORAL_TABLET | ORAL | Status: DC | PRN
  Administered 2021-01-22 – 2021-01-29 (×15): 5 mg via ORAL
  Filled 2021-01-22 (×16): qty 1

## 2021-01-22 MED ORDER — ALBUMIN HUMAN 5 % IV SOLN
12.5000 g | Freq: Once | INTRAVENOUS | Status: AC
  Administered 2021-01-22: 02:00:00 12.5 g via INTRAVENOUS
  Filled 2021-01-22: qty 250

## 2021-01-22 MED ORDER — POTASSIUM CHLORIDE CRYS ER 20 MEQ PO TBCR
40.0000 meq | EXTENDED_RELEASE_TABLET | Freq: Every day | ORAL | Status: AC
  Administered 2021-01-22: 11:00:00 40 meq via ORAL
  Filled 2021-01-22 (×5): qty 2

## 2021-01-22 MED ORDER — SODIUM CHLORIDE 0.9 % IV SOLN: INTRAVENOUS | Status: DC

## 2021-01-22 MED ORDER — KETOROLAC TROMETHAMINE 15 MG/ML IJ SOLN
15.0000 mg | Freq: Once | INTRAMUSCULAR | Status: AC
  Administered 2021-01-22: 06:00:00 15 mg via INTRAVENOUS
  Filled 2021-01-22: qty 1

## 2021-01-22 MED ORDER — LACTATED RINGERS IV SOLN: INTRAVENOUS | Status: AC

## 2021-01-22 NOTE — Progress Notes (Signed)
Nurse spoke with provider in reference to patient's blood pressure. See MAR for intervention. Will continue to monitor.

## 2021-01-22 NOTE — Progress Notes (Signed)
@  2246 pt had total urine output of 300 ml, Bladder Scan pt and had 614 ml.  @2255  Notified on call urology Dr. Junious Silk no new orders received and will continue to monitor pt.   @2307  pt voided 100 ml, no c/o of pain or fullness of bladder, pt states she feels better and comfortable and will continue plan of care.

## 2021-01-22 NOTE — Progress Notes (Signed)
2 Days Post-Op Subjective: Denies abdominal pain.  No nausea or emesis.  Tolerating Foley.  Remains afebrile.  Objective: Vital signs in last 24 hours: Temp:  [98 F (36.7 C)-99.6 F (37.6 C)] 98.4 F (36.9 C) (12/18 0738) Pulse Rate:  [66-103] 83 (12/18 0900) Resp:  [0-31] 18 (12/18 0900) BP: (86-164)/(39-88) 132/69 (12/18 0900) SpO2:  [88 %-100 %] 93 % (12/18 0900)  Intake/Output from previous day: 12/17 0701 - 12/18 0700 In: 1282.9 [I.V.:1122.9; IV Piggyback:159.9] Out: 1710 [Urine:1710] Intake/Output this shift: Total I/O In: -  Out: 400 [Urine:400]  UOP: 1.7L, clear yellow  Physical Exam:  General: Alert and oriented CV: RRR Lungs: Clear Abdomen: Soft, ND, NT Ext: NT, No erythema  Lab Results: Recent Labs    01/21/21 0359 01/21/21 0816 01/22/21 0326  HGB 11.2* 11.6* 11.4*  HCT 35.8* 37.1 37.2   BMET Recent Labs    01/21/21 0149 01/22/21 0326  NA 134* 135  K 4.0 4.7  CL 109 111  CO2 19* 19*  GLUCOSE 160* 186*  BUN 28* 24*  CREATININE 1.41* 1.00  CALCIUM 7.0* 7.4*     Studies/Results: CT ABDOMEN PELVIS WO CONTRAST  Result Date: 01/21/2021 CLINICAL DATA:  Hydronephrosis. EXAM: CT ABDOMEN AND PELVIS WITHOUT CONTRAST TECHNIQUE: Multidetector CT imaging of the abdomen and pelvis was performed following the standard protocol without IV contrast. COMPARISON:  CT abdomen and pelvis 01/20/2021. FINDINGS: Lower chest: There is new atelectasis in the bilateral lower lobes. Hepatobiliary: Gallbladder surgically absent. Common bile duct is dilated, but stable in size. Liver is within normal limits. Pancreas: Atrophic, unchanged. Spleen: Normal in size without focal abnormality. Adrenals/Urinary Tract: A new right ureteral stent is in place. There is no hydronephrosis. No ureteral calculi are seen. 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 There is a small amount of air in the right renal pelvis and collecting system which  has slightly decreased. Right perinephric and Peri ureteral stranding is present, but has decreased. There is a stable mildly hyperdense low-attenuation area in the superior pole the left kidney measuring 3.2 cm. There are additional hypodensities in the kidneys which are too small to characterize. There is no left-sided hydronephrosis or urinary tract calculus. The bladder is decompressed by Foley catheter. The adrenal glands are within normal limits. Stomach/Bowel: Stomach is within normal limits. No evidence of bowel wall thickening, distention, or inflammatory changes. The appendix is not visualized. Vascular/Lymphatic: Aortic atherosclerosis. No enlarged abdominal or pelvic lymph nodes. Reproductive: Status post hysterectomy. No adnexal masses. Other: There is trace free fluid in the pelvis. There is no abdominal wall hernia. Musculoskeletal: No acute or significant osseous findings. IMPRESSION: 1. New right ureteral stent in place. No hydronephrosis. There is a small amount of air in the right renal collecting system and pelvis which has decreased from prior. Infection not excluded. 2. Stable right staghorn calculus.  No right ureteral calculi. 3. Right perinephric and Periureteral stranding has decreased. 4. Stable lesion in the superior pole the left kidney, likely a proteinaceous cyst. 5.  Aortic Atherosclerosis (ICD10-I70.0). Electronically Signed   By: Ronney Asters M.D.   On: 01/21/2021 15:13   DG C-Arm 1-60 Min-No Report  Result Date: 01/20/2021 Fluoroscopy was utilized by the requesting physician.  No radiographic interpretation.   VAS [place] LOWER EXTREMITY VENOUS (DVT)  Result Date: 01/21/2021  Lower Venous DVT Study Patient Name:  Mariah Reed  Date of Exam:   01/20/2021 Medical Rec #: [id number]  Accession #:    [id number] Date of Birth: 02/08/1945         Patient Gender: F Patient Age:   75 years Exam Location:  [facility] Procedure:      VAS [place] LOWER EXTREMITY VENOUS (DVT)  Referring Phys: DAVID ORTIZ --------------------------------------------------------------------------------  Indications: Swelling.  Risk Factors: None identified. Limitations: Poor ultrasound/tissue interface and patient pain tolerance. Comparison Study: No prior studies. Performing Technologist: Oliver Hum RVT  Examination Guidelines: A complete evaluation includes B-mode imaging, spectral Doppler, color Doppler, and power Doppler as needed of all accessible portions of each vessel. Bilateral testing is considered an integral part of a complete examination. Limited examinations for reoccurring indications may be performed as noted. The reflux portion of the exam is performed with the patient in reverse Trendelenburg.  +---------+---------------+---------+-----------+----------+--------------+  RIGHT     Compressibility Phasicity Spontaneity Properties Thrombus Aging  +---------+---------------+---------+-----------+----------+--------------+  CFV       Full            Yes       Yes                                    +---------+---------------+---------+-----------+----------+--------------+  SFJ       Full                                                             +---------+---------------+---------+-----------+----------+--------------+  FV Prox   Full                                                             +---------+---------------+---------+-----------+----------+--------------+  FV Mid    Full                                                             +---------+---------------+---------+-----------+----------+--------------+  FV Distal Full                                                             +---------+---------------+---------+-----------+----------+--------------+  PFV       Full                                                             +---------+---------------+---------+-----------+----------+--------------+  POP       Full            Yes       Yes                                     +---------+---------------+---------+-----------+----------+--------------+  PTV       Full                                                             +---------+---------------+---------+-----------+----------+--------------+  PERO      Full                                                             +---------+---------------+---------+-----------+----------+--------------+   +---------+---------------+---------+-----------+----------+--------------+  LEFT      Compressibility Phasicity Spontaneity Properties Thrombus Aging  +---------+---------------+---------+-----------+----------+--------------+  CFV       Full            Yes       Yes                                    +---------+---------------+---------+-----------+----------+--------------+  SFJ       Full                                                             +---------+---------------+---------+-----------+----------+--------------+  FV Prox   Full                                                             +---------+---------------+---------+-----------+----------+--------------+  FV Mid    Full                                                             +---------+---------------+---------+-----------+----------+--------------+  FV Distal                 Yes       Yes                                    +---------+---------------+---------+-----------+----------+--------------+  PFV       Full                                                             +---------+---------------+---------+-----------+----------+--------------+  POP       Full            Yes       Yes                                    +---------+---------------+---------+-----------+----------+--------------+  PTV       Full                                                             +---------+---------------+---------+-----------+----------+--------------+  PERO      Full                                                              +---------+---------------+---------+-----------+----------+--------------+     Summary: RIGHT: - There is no evidence of deep vein thrombosis in the lower extremity. However, portions of this examination were limited- see technologist comments above.  - No cystic structure found in the popliteal fossa.  LEFT: - There is no evidence of deep vein thrombosis in the lower extremity. However, portions of this examination were limited- see technologist comments above.  - No cystic structure found in the popliteal fossa.  *See table(s) above for measurements and observations. Electronically signed by Orlie Pollen on 01/21/2021 at 10:28:34 AM.    Final     Assessment/Plan: Right emphysematous pyelitis with pyonephrosis: CT A/P 01/20/2021 with 3 mm obstructing right distal ureteral stone, gas in the collecting system, large staghorn stone in the right lower pole extending the collecting system with gas in the right lower pole, significant right-sided perinephric stranding suggesting rupture of a calyx.  Afebrile, WBC 17.9, creatinine 1.7, urinalysis indicating infection. S/p cysto, R RPG, R stent 01/20/2021. AKI: Creatinine 1.7 from baseline of 1 at time of consultation. Improving Distal right ureteral and large staghorn right renal stone (4x1.7x1.9cm)  -She is comfortable, hemodynamically appropriate on rounds this AM. Remains afebrile. -Reviewed CT yesterday with no hydronephrosis, right stent in appropriate position and only a very small amount of air in the right collecting system which is significantly decreased from prior. -Continue broad spectrum antibiotics -F/u urine culture.  Preliminary result 40K GNR -Void trial today -Discussed that she will require PCNL to treat her large 4cm staghorn stone. This will be done outpatient. Arranged followup. -I have attempted to call both of her daughters while standing with her at bedside.  Unfortunately the numbers that she is providing me are not in service.   She has no other family member that she would like for me to call.  She understands that given his history of emphysematous pyelitis, although she is making a great recovery is associated with a 20% mortality rate -Following   LOS: 2 days   Matt R. Vaunda Gutterman MD 01/22/2021, 9:36 AM [facility]  Pager: [phone]

## 2021-01-22 NOTE — Progress Notes (Signed)
Nurse noted that the patient's blood pressures are soft in nature. See vital signs flowsheet for trend. Nurse reach out to provider Kennon Holter, NP for guidance. Will continue to monitor.

## 2021-01-22 NOTE — Progress Notes (Signed)
PROGRESS NOTE    Mariah Reed  HWE:[id number] DOB: 05/15/1945 DOA: 01/20/2021 PCP: Gladstone Lighter, MD    Brief Narrative:  75 y.o. female with medical history significant of seasonal allergies, COPD, type II DM, gout, hyperlipidemia, hypertension, chronic lower back pain, peripheral neuropathy who is coming from [facility] ED to the emergency department due to severe sepsis in the setting of acute emphysematous pyelonephritis  Assessment & Plan:   Principal Problem:   Acute pyonephrosis Active Problems:   Diabetes mellitus type 2 with complications ([place])   HLD (hyperlipidemia)   GOUT   Essential hypertension   COPD (chronic obstructive pulmonary disease) ([place])   Gastroesophageal reflux disease without esophagitis   Hypothyroidism   Severe sepsis (HCC)   Hydronephrosis of right kidney   Hydroureter on right   Rhabdomyolysis  Principal Problem:   Severe sepsis POA secondary to Acute pyonephrosis with Hydronephrosis of right kidney and Hydroureter on right Status post retrogrades cystoscopy, Staghorn stone removal and stent placement. Continued on Zosyn every 8 hours. Blood cultures negative thus far Urine cultures thus far pos for gm neg rods, pending speciation Urology consult and intervention appreciated.   Active Problems:   Rhabdomyolysis Continue IV fluids. CK has trended down to 1445 Hold statin. Continue to follow CK trends     Diabetes mellitus type 2 with complications (HCC) Carbohydrate modified diet.     HLD (hyperlipidemia) Hold lovastatin as per above     GOUT Continue allopurinol.     Essential hypertension Continue Procardia XL 60 mg p.o. daily. Continue metoprolol tartrate 12.5 mg p.o. twice daily as needed. Blood pressure remains stable at this time     COPD (chronic obstructive pulmonary disease) ([place]) Continued on room air Albuterol MDI as needed.     Gastroesophageal reflux disease without esophagitis Continue PPI.      Hypothyroidism Continue levothyroxine 75 mcg p.o. daily as tolerated    DVT prophylaxis: SCD's Code Status: Full Family Communication: Pt in room, family not at bedside  Status is: Inpatient  Remains inpatient appropriate because: Severity of illness    Consultants:  Urology  Procedures:  Cystoscopy with retrograde pyelogram and stent placement  Antimicrobials: Anti-infectives (From admission, onward)    Start     Dose/Rate Route Frequency Ordered Stop   01/20/21 1800  piperacillin-tazobactam (ZOSYN) IVPB 3.375 g        3.375 g 12.5 mL/hr over 240 Minutes Intravenous Every 8 hours 01/20/21 1605     01/20/21 1045  piperacillin-tazobactam (ZOSYN) IVPB 3.375 g        3.375 g 12.5 mL/hr over 240 Minutes Intravenous NOW 01/20/21 1038 01/20/21 1558   01/20/21 0800  piperacillin-tazobactam (ZOSYN) IVPB 3.375 g        3.375 g 12.5 mL/hr over 240 Minutes Intravenous Once 01/20/21 0759 01/20/21 0940       Subjective: Reporting continued chronic back and neck pains  Objective: Vitals:   01/22/21 0800 01/22/21 0900 01/22/21 1000 01/22/21 1139  BP: 101/81 132/69 (!) 131/58   Pulse: 86 83 85   Resp: 18 18 18    Temp:    98.2 F (36.8 C)  TempSrc:    Oral  SpO2: 91% 93% 96%   Weight:      Height:        Intake/Output Summary (Last 24 hours) at 01/22/2021 1456 Last data filed at 01/22/2021 0800 Gross per 24 hour  Intake 404.05 ml  Output 1535 ml  Net -1130.95 ml    Danley Danker  Weights   01/20/21 0600 01/20/21 1021 01/20/21 1151  Weight: 78.9 kg 79.4 kg 77.1 kg    Examination: General exam: Conversant, in no acute distress Respiratory system: normal chest rise, clear, no audible wheezing Cardiovascular system: regular rhythm, s1-s2 Gastrointestinal system: Nondistended, nontender, pos BS Central nervous system: No seizures, no tremors Extremities: No cyanosis, no joint deformities Skin: No rashes, no pallor Psychiatry: Affect normal // no auditory hallucinations    Data Reviewed: I have personally reviewed following labs and imaging studies  CBC: Recent Labs  Lab 01/20/21 0830 01/21/21 0149 01/21/21 0359 01/21/21 0816 01/22/21 0326  WBC 17.9* 12.7* 11.1* 10.1 10.2  NEUTROABS 16.6*  --   --   --   --   HGB 14.6 11.5* 11.2* 11.6* 11.4*  HCT 45.5 36.7 35.8* 37.1 37.2  MCV 89.4 89.5 90.4 89.8 91.0  PLT 130* 76* 74* 76* 86*    Basic Metabolic Panel: Recent Labs  Lab 01/20/21 0655 01/20/21 0830 01/21/21 0149 01/22/21 0326  NA 136 134* 134* 135  K 3.6 3.2* 4.0 4.7  CL 105 106 109 111  CO2  --  17* 19* 19*  GLUCOSE 377* 327* 160* 186*  BUN 22 20 28* 24*  CREATININE 1.70* 1.73* 1.41* 1.00  CALCIUM  --  8.1* 7.0* 7.4*    GFR: Estimated Creatinine Clearance: 49.1 mL/min (by C-G formula based on SCr of 1 mg/dL). Liver Function Tests: Recent Labs  Lab 01/20/21 0830 01/21/21 0149 01/22/21 0326  AST 58* 88* 64*  ALT 43 38 36  ALKPHOS 130* 85 145*  BILITOT 0.6 0.6 0.6  PROT 5.9* 4.9* 5.4*  ALBUMIN 3.1* 2.6* 2.8*    Recent Labs  Lab 01/20/21 0830  LIPASE 18    No results for input(s): AMMONIA in the last 168 hours. Coagulation Profile: Recent Labs  Lab 01/20/21 0830  INR 1.2    Cardiac Enzymes: Recent Labs  Lab 01/20/21 0830 01/21/21 0149 01/22/21 0326  CKTOTAL 1,774* 3,434* 1,445*    BNP (last 3 results) No results for input(s): PROBNP in the last 8760 hours. HbA1C: Recent Labs    01/21/21 0149  HGBA1C 10.5*    CBG: Recent Labs  Lab 01/21/21 1223 01/21/21 1602 01/21/21 2128 01/22/21 0756 01/22/21 1137  GLUCAP 145* 247* 179* 157* 180*    Lipid Profile: No results for input(s): CHOL, HDL, LDLCALC, TRIG, CHOLHDL, LDLDIRECT in the last 72 hours. Thyroid Function Tests: No results for input(s): TSH, T4TOTAL, FREET4, T3FREE, THYROIDAB in the last 72 hours. Anemia Panel: No results for input(s): VITAMINB12, FOLATE, FERRITIN, TIBC, IRON, RETICCTPCT in the last 72 hours. Sepsis Labs: Recent Labs   Lab 01/20/21 0635 01/20/21 0830 01/21/21 0149 01/21/21 0359  LATICACIDVEN 8.1* 4.1* 1.6 1.8     Recent Results (from the past 240 hour(s))  Blood Culture (routine x 2)     Status: None (Preliminary result)   Collection Time: 01/20/21  8:10 AM   Specimen: BLOOD  Result Value Ref Range Status   Specimen Description BLOOD BLOOD LEFT HAND  Final   Special Requests   Final    BOTTLES DRAWN AEROBIC AND ANAEROBIC Blood Culture results may not be optimal due to an excessive volume of blood received in culture bottles   Culture   Final    NO GROWTH 1 DAY Performed at [facility], [place] [address], [place], [place] [address]    Report Status PENDING  Incomplete  Resp Panel by RT-PCR (Flu A&B, Covid) Nasopharyngeal Swab  Status: None   Collection Time: 01/20/21  8:14 AM   Specimen: Nasopharyngeal Swab; Nasopharyngeal(NP) swabs in vial transport medium  Result Value Ref Range Status   SARS Coronavirus 2 by RT PCR NEGATIVE NEGATIVE Final    Comment: (NOTE) SARS-CoV-2 target nucleic acids are NOT DETECTED.  The SARS-CoV-2 RNA is generally detectable in upper respiratory specimens during the acute phase of infection. The lowest concentration of SARS-CoV-2 viral copies this assay can detect is 138 copies/mL. A negative result does not preclude SARS-Cov-2 infection and should not be used as the sole basis for treatment or other patient management decisions. A negative result may occur with  improper specimen collection/handling, submission of specimen other than nasopharyngeal swab, presence of viral mutation(s) within the areas targeted by this assay, and inadequate number of viral copies(<138 copies/mL). A negative result must be combined with clinical observations, patient history, and epidemiological information. The expected result is Negative.  Fact Sheet for Patients:  EntrepreneurPulse.com.au  Fact Sheet for Healthcare Providers:   IncredibleEmployment.be  This test is no t yet approved or cleared by the [place] FDA and  has been authorized for detection and/or diagnosis of SARS-CoV-2 by FDA under an Emergency Use Authorization (EUA). This EUA will remain  in effect (meaning this test can be used) for the duration of the COVID-19 declaration under Section 564(b)(1) of the Act, 21 U.S.C.section 360bbb-3(b)(1), unless the authorization is terminated  or revoked sooner.       Influenza A by PCR NEGATIVE NEGATIVE Final   Influenza B by PCR NEGATIVE NEGATIVE Final    Comment: (NOTE) The Xpert Xpress SARS-CoV-2/FLU/RSV plus assay is intended as an aid in the diagnosis of influenza from Nasopharyngeal swab specimens and should not be used as a sole basis for treatment. Nasal washings and aspirates are unacceptable for Xpert Xpress SARS-CoV-2/FLU/RSV testing.  Fact Sheet for Patients: EntrepreneurPulse.com.au  Fact Sheet for Healthcare Providers: IncredibleEmployment.be  This test is not yet approved or cleared by the [place] FDA and has been authorized for detection and/or diagnosis of SARS-CoV-2 by FDA under an Emergency Use Authorization (EUA). This EUA will remain in effect (meaning this test can be used) for the duration of the COVID-19 declaration under Section 564(b)(1) of the Act, 21 U.S.C. section 360bbb-3(b)(1), unless the authorization is terminated or revoked.  Performed at [facility], [place] [address], [place], [place] [address]   Blood Culture (routine x 2)     Status: None (Preliminary result)   Collection Time: 01/20/21 10:50 AM   Specimen: BLOOD  Result Value Ref Range Status   Specimen Description   Final    BLOOD RIGHT ANTECUBITAL Performed at [place] [address] [place], [place] [address]    Special Requests   Final    BOTTLES DRAWN AEROBIC AND ANAEROBIC Blood Culture adequate  volume Performed at [place] [address] [place], [place] [address]    Culture   Final    NO GROWTH 1 DAY Performed at [facility], [place] [address], [place], [place] [address]    Report Status PENDING  Incomplete  Urine Culture     Status: Abnormal (Preliminary result)   Collection Time: 01/20/21  2:21 PM   Specimen: In/Out Cath Urine  Result Value Ref Range Status   Specimen Description   Final    IN/OUT CATH URINE Performed at [facility] [place] [address] [place], [place] [address]    Special Requests  Final    NONE Performed at [facility], [place] [address], [place], [place] [address]    Culture (A)  Final    40,000 COLONIES/mL ESCHERICHIA COLI 20,000 COLONIES/mL PROTEUS MIRABILIS SUSCEPTIBILITIES TO FOLLOW Performed at [facility], [place] [address], [place], [place] [address]    Report Status PENDING  Incomplete  MRSA Next Gen by PCR, Nasal     Status: None   Collection Time: 01/20/21  4:04 PM   Specimen: Nasal Mucosa; Nasal Swab  Result Value Ref Range Status   MRSA by PCR Next Gen NOT DETECTED NOT DETECTED Final    Comment: (NOTE) The GeneXpert MRSA Assay (FDA approved for NASAL specimens only), is one component of a comprehensive MRSA colonization surveillance program. It is not intended to diagnose MRSA infection nor to guide or monitor treatment for MRSA infections. Test performance is not FDA approved in patients less than 4 years old. Performed at [facility], [place] [address] [place], [place] [address]       Radiology Studies: CT ABDOMEN PELVIS WO CONTRAST  Result Date: 01/21/2021 CLINICAL DATA:  Hydronephrosis. EXAM: CT ABDOMEN AND PELVIS WITHOUT CONTRAST TECHNIQUE: Multidetector CT imaging of the abdomen and pelvis was performed following the standard protocol without IV contrast. COMPARISON:  CT abdomen and pelvis 01/20/2021. FINDINGS: Lower chest: There is  new atelectasis in the bilateral lower lobes. Hepatobiliary: Gallbladder surgically absent. Common bile duct is dilated, but stable in size. Liver is within normal limits. Pancreas: Atrophic, unchanged. Spleen: Normal in size without focal abnormality. Adrenals/Urinary Tract: A new right ureteral stent is in place. There is no hydronephrosis. No ureteral calculi are seen. 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 There is a small amount of air in the right renal pelvis and collecting system which has slightly decreased. Right perinephric and Peri ureteral stranding is present, but has decreased. There is a stable mildly hyperdense low-attenuation area in the superior pole the left kidney measuring 3.2 cm. There are additional hypodensities in the kidneys which are too small to characterize. There is no left-sided hydronephrosis or urinary tract calculus. The bladder is decompressed by Foley catheter. The adrenal glands are within normal limits. Stomach/Bowel: Stomach is within normal limits. No evidence of bowel wall thickening, distention, or inflammatory changes. The appendix is not visualized. Vascular/Lymphatic: Aortic atherosclerosis. No enlarged abdominal or pelvic lymph nodes. Reproductive: Status post hysterectomy. No adnexal masses. Other: There is trace free fluid in the pelvis. There is no abdominal wall hernia. Musculoskeletal: No acute or significant osseous findings. IMPRESSION: 1. New right ureteral stent in place. No hydronephrosis. There is a small amount of air in the right renal collecting system and pelvis which has decreased from prior. Infection not excluded. 2. Stable right staghorn calculus.  No right ureteral calculi. 3. Right perinephric and Periureteral stranding has decreased. 4. Stable lesion in the superior pole the left kidney, likely a proteinaceous cyst. 5.  Aortic Atherosclerosis (ICD10-I70.0). Electronically Signed   By: Ronney Asters M.D.    On: 01/21/2021 15:13    Scheduled Meds:  alum & mag hydroxide-simeth  30 mL Oral BID   Chlorhexidine Gluconate Cloth  6 each Topical Daily   diclofenac  2 patch Transdermal BID   diphenoxylate-atropine  2 tablet Oral BID   insulin aspart  0-15 Units Subcutaneous TID WC   levothyroxine  75 mcg Oral QAC breakfast   lipase/protease/amylase  72,000 Units Oral TID with meals  NIFEdipine  60 mg Oral Daily   pantoprazole  40 mg Oral Daily   potassium chloride  40 mEq Oral Daily   pregabalin  25 mg Oral Once   pregabalin  25 mg Oral BID   tiZANidine  4 mg Oral QHS   topiramate  100 mg Oral q morning   umeclidinium bromide  1 puff Inhalation Daily   Continuous Infusions:  piperacillin-tazobactam 3.375 g (01/22/21 1447)     LOS: 2 days   Marylu Lund, MD [facility] Pager On Amion  If 7PM-7AM, please contact night-coverage 01/22/2021, 2:56 PM

## 2021-01-22 NOTE — Progress Notes (Signed)
Nurse contacted provider Blount in reference to patient's pain. Patient stated she has pain in her neck from bulging disks. Patient only has tylenol noted as prn. Will continue to monitor.

## 2021-01-22 NOTE — Progress Notes (Signed)
Nurse noted another drop in patient's blood pressure. Nurse contacted provider Blount for guidance. See MAR for intervention. Will continue to monitor.

## 2021-01-23 ENCOUNTER — Encounter (HOSPITAL_COMMUNITY): Payer: Self-pay | Admitting: Urology

## 2021-01-23 ENCOUNTER — Inpatient Hospital Stay (HOSPITAL_COMMUNITY): Payer: Medicare Other

## 2021-01-23 DIAGNOSIS — N136 Pyonephrosis: Secondary | ICD-10-CM | POA: Diagnosis not present

## 2021-01-23 DIAGNOSIS — E118 Type 2 diabetes mellitus with unspecified complications: Secondary | ICD-10-CM | POA: Diagnosis not present

## 2021-01-23 DIAGNOSIS — I10 Essential (primary) hypertension: Secondary | ICD-10-CM | POA: Diagnosis not present

## 2021-01-23 LAB — GLUCOSE, CAPILLARY
Glucose-Capillary: 239 mg/dL — ABNORMAL HIGH (ref 70–99)
Glucose-Capillary: 276 mg/dL — ABNORMAL HIGH (ref 70–99)
Glucose-Capillary: 220 mg/dL — ABNORMAL HIGH (ref 70–99)
Glucose-Capillary: 188 mg/dL — ABNORMAL HIGH (ref 70–99)

## 2021-01-23 LAB — COMPREHENSIVE METABOLIC PANEL
ALT: 30 U/L (ref 0–44)
AST: 44 U/L — ABNORMAL HIGH (ref 15–41)
Albumin: 2.7 g/dL — ABNORMAL LOW (ref 3.5–5.0)
Alkaline Phosphatase: 194 U/L — ABNORMAL HIGH (ref 38–126)
Anion gap: 5 (ref 5–15)
BUN: 16 mg/dL (ref 8–23)
CO2: 20 mmol/L — ABNORMAL LOW (ref 22–32)
Calcium: 8 mg/dL — ABNORMAL LOW (ref 8.9–10.3)
Chloride: 113 mmol/L — ABNORMAL HIGH (ref 98–111)
Creatinine, Ser: 0.82 mg/dL (ref 0.44–1.00)
GFR, Estimated: >60 mL/min (ref >60)
Glucose, Bld: 223 mg/dL — ABNORMAL HIGH (ref 70–99)
Potassium: 4.9 mmol/L (ref 3.5–5.1)
Sodium: 138 mmol/L (ref 135–145)
Total Bilirubin: 0.9 mg/dL (ref 0.3–1.2)
Total Protein: 5.4 g/dL — ABNORMAL LOW (ref 6.5–8.1)

## 2021-01-23 LAB — CBC
HCT: 37 % (ref 36.0–46.0)
Hemoglobin: 11.6 g/dL — ABNORMAL LOW (ref 12.0–15.0)
MCH: 28 pg (ref 26.0–34.0)
MCHC: 31.4 g/dL (ref 30.0–36.0)
MCV: 89.4 fL (ref 80.0–100.0)
Platelets: 103 10*3/uL — ABNORMAL LOW (ref 150–400)
RBC: 4.14 MIL/uL (ref 3.87–5.11)
RDW: 14.4 % (ref 11.5–15.5)
WBC: 7.8 10*3/uL (ref 4.0–10.5)
nRBC: 0 % (ref 0.0–0.2)

## 2021-01-23 LAB — MAGNESIUM: Magnesium: 2.5 mg/dL — ABNORMAL HIGH (ref 1.7–2.4)

## 2021-01-23 LAB — URINE CULTURE: Culture: 40000 — AB

## 2021-01-23 LAB — CK: Total CK: 564 U/L — ABNORMAL HIGH (ref 38–234)

## 2021-01-23 MED ORDER — FUROSEMIDE 10 MG/ML IJ SOLN
40.0000 mg | Freq: Once | INTRAMUSCULAR | Status: AC
  Administered 2021-01-23: 13:00:00 40 mg via INTRAVENOUS
  Filled 2021-01-23: qty 4

## 2021-01-23 MED ORDER — LABETALOL HCL 5 MG/ML IV SOLN: 5.0000 mg | INTRAVENOUS | Status: DC | PRN

## 2021-01-23 MED ORDER — HYDRALAZINE HCL 10 MG PO TABS: 10.0000 mg | ORAL_TABLET | Freq: Three times a day (TID) | ORAL | Status: DC | PRN

## 2021-01-23 MED ORDER — ENOXAPARIN SODIUM 40 MG/0.4ML IJ SOSY
40.0000 mg | PREFILLED_SYRINGE | INTRAMUSCULAR | Status: DC
  Administered 2021-01-23 – 2021-01-25 (×3): 40 mg via SUBCUTANEOUS
  Filled 2021-01-23 (×3): qty 0.4

## 2021-01-23 MED ORDER — ENOXAPARIN SODIUM 80 MG/0.8ML IJ SOSY: 80.0000 mg | PREFILLED_SYRINGE | Freq: Two times a day (BID) | INTRAMUSCULAR | Status: DC

## 2021-01-23 MED ORDER — IOHEXOL 350 MG/ML SOLN
80.0000 mL | Freq: Once | INTRAVENOUS | Status: AC | PRN
  Administered 2021-01-23: 11:00:00 80 mL via INTRAVENOUS

## 2021-01-23 MED ORDER — METOPROLOL TARTRATE 25 MG PO TABS
25.0000 mg | ORAL_TABLET | Freq: Two times a day (BID) | ORAL | Status: DC
  Administered 2021-01-23 (×2): 25 mg via ORAL
  Filled 2021-01-23 (×2): qty 1

## 2021-01-23 NOTE — Plan of Care (Signed)
°  Problem: Clinical Measurements: Goal: Will remain free from infection Outcome: Progressing   Problem: Activity: Goal: Risk for activity intolerance will decrease Outcome: Progressing   Problem: Respiratory: Goal: Ability to maintain adequate ventilation will improve Outcome: Progressing   Problem: Urinary Elimination: Goal: Signs and symptoms of infection will decrease Outcome: Progressing   Problem: Pain Managment: Goal: General experience of comfort will improve Outcome: Progressing

## 2021-01-23 NOTE — Progress Notes (Signed)
3 Days Post-Op Subjective: Denies pain. No nausea or emesis. Afebrile. Voiding clear yellow via purewick.  Objective: Vital signs in last 24 hours: Temp:  [98.2 F (36.8 C)-99.7 F (37.6 C)] 99.4 F (37.4 C) (12/19 1038) Pulse Rate:  [69-116] 103 (12/19 1336) Resp:  [17-32] 18 (12/19 1336) BP: (99-146)/(53-105) 109/53 (12/19 1336) SpO2:  [80 %-99 %] 96 % (12/19 1336)  Intake/Output from previous day: 12/18 0701 - 12/19 0700 In: 609.6 [P.O.:480; IV Piggyback:129.6] Out: 1800 [Urine:1800] Intake/Output this shift: Total I/O In: 71.7 [IV Piggyback:71.7] Out: 300 [Urine:300]  Physical Exam:  General: Alert and oriented CV: RRR Lungs: Clear Abdomen: Soft, ND, NT Ext: NT, No erythema  Lab Results: Recent Labs    01/21/21 0816 01/22/21 0326 01/23/21 0424  HGB 11.6* 11.4* 11.6*  HCT 37.1 37.2 37.0   BMET Recent Labs    01/22/21 0326 01/23/21 0424  NA 135 138  K 4.7 4.9  CL 111 113*  CO2 19* 20*  GLUCOSE 186* 223*  BUN 24* 16  CREATININE 1.00 0.82  CALCIUM 7.4* 8.0*     Studies/Results: CT Angio Chest Pulmonary Embolism (PE) W or WO Contrast  Result Date: 01/23/2021 CLINICAL DATA:  Rule out pulmonary embolus. EXAM: CT ANGIOGRAPHY CHEST WITH CONTRAST TECHNIQUE: Multidetector CT imaging of the chest was performed using the standard protocol during bolus administration of intravenous contrast. Multiplanar CT image reconstructions and MIPs were obtained to evaluate the vascular anatomy. CONTRAST:  54mL OMNIPAQUE IOHEXOL 350 MG/ML SOLN COMPARISON:  None. FINDINGS: Cardiovascular: Exam detail is diminished secondary to respiratory motion artifact. Pulmonary arterial opacification is also suboptimal. Within these limitations there is no signs of central pulmonary artery or lobar pulmonary artery embolus. Beyond the lobar pulmonary arteries exam detail is essentially nondiagnostic for pulmonary embolus. Heart size is upper limits of normal. No pericardial effusion. Aortic  atherosclerosis. Mild LAD coronary artery calcifications. Mediastinum/Nodes: No enlarged mediastinal, hilar, or axillary lymph nodes. Thyroid gland, trachea, and esophagus demonstrate no significant findings. Lungs/Pleura: Significantly degraded exam detail of the lungs due to motion artifact. Small left pleural effusion and moderate right pleural effusion identified. There is diffuse interlobular septal thickening identified suggesting interstitial edema. Airspace consolidation versus compressive type atelectasis is noted overlying the right pleural effusion, image 97/7. Subpleural consolidation is also noted within the posterior and lateral right upper lobe, image 43/7.Indeterminate subpleural nodule is identified within the anterior left upper lobe measuring 1.2 cm, image 70/7. Upper Abdomen: Images through the upper abdomen are also degraded by motion artifact. Signs of previous cholecystectomy with increase caliber of the common bile duct is again noted and appears similar to CT from 01/21/2021. Musculoskeletal: No chest wall abnormality. No acute or significant osseous findings. Review of the MIP images confirms the above findings. IMPRESSION: 1. Exam detail is diminished secondary to respiratory motion artifact and diminished pulmonary arterial opacification. Within these limitations there is no signs of central pulmonary artery or lobar pulmonary artery embolus. Beyond the lobar pulmonary arteries exam detail is essentially nondiagnostic for pulmonary embolus. 2. Bilateral pleural effusions, right greater than left. There is, bilateral interlobular septal thickening compatible with interstitial edema. Correlate for signs or symptoms of congestive heart failure. 3. Airspace consolidation versus compressive type atelectasis is noted overlying the right pleural effusion. Underlying infection not excluded. 4. Indeterminate subpleural nodule is identified within the anterior left upper lobe measuring 1.2 cm.  Repeat CT of the chest following resolution of patient's current acute clinical condition is recommended for more definitive characterization. 5. Aortic  Atherosclerosis (ICD10-I70.0). Electronically Signed   By: Kerby Moors M.D.   On: 01/23/2021 11:46    Assessment/Plan: Right emphysematous pyelitis with pyonephrosis: CT A/P 01/20/2021 with 3 mm obstructing right distal ureteral stone, gas in the collecting system, large staghorn stone in the right lower pole extending the collecting system with gas in the right lower pole, significant right-sided perinephric stranding suggesting rupture of a calyx.  Afebrile, WBC 17.9, creatinine 1.7, urinalysis indicating infection. S/p cysto, R RPG, R stent 01/20/2021. AKI: Creatinine 1.7 from baseline of 1 at time of consultation. Improving Distal right ureteral and large staghorn right renal stone (4x1.7x1.9cm)  -Continue broad spectrum antibiotics -F/u Urine cx 40K e coli and 20K proteus. Will require culture specific abx for 2 weeks. -Discussed that she will require PCNL to treat her large 4cm staghorn stone. This will be done outpatient. Arranged followup. -I have attempted to call both of her daughters while standing with her at bedside.  Unfortunately the numbers that she is providing me are not in service.  She has no other family member that she would like for me to call.  She understands that given his history of emphysematous pyelitis, although she is making a great recovery is associated with a 20% mortality rate -Following   LOS: 3 days   Matt R. Ruble Buttler MD 01/23/2021, 3:35 PM [facility]  Pager: [phone]

## 2021-01-23 NOTE — Plan of Care (Signed)
  Problem: Education: Goal: Knowledge of General Education information will improve Description Including pain rating scale, medication(s)/side effects and non-pharmacologic comfort measures Outcome: Progressing   

## 2021-01-23 NOTE — Anesthesia Postprocedure Evaluation (Signed)
Anesthesia Post Note  Patient: Mariah Reed  Procedure(s) Performed: CYSTOSCOPY WITH RETROGRADE PYELOGRAM, URETEROSCOPY AND STENT PLACEMENT (Right)     Patient location during evaluation: PACU Anesthesia Type: General Level of consciousness: awake and alert Pain management: pain level controlled Vital Signs Assessment: post-procedure vital signs reviewed and stable Respiratory status: spontaneous breathing, nonlabored ventilation, respiratory function stable and patient connected to nasal cannula oxygen Cardiovascular status: blood pressure returned to baseline and stable Postop Assessment: no apparent nausea or vomiting Anesthetic complications: no   No notable events documented.        [name]

## 2021-01-23 NOTE — Progress Notes (Signed)
OT Cancellation Note  Patient Details Name: Mariah Reed MRN: [id number] DOB: 04-21-1945   Cancelled Treatment:    Reason Eval/Treat Not Completed: Medical issues which prohibited therapy. HR in 160-170s at rest this morning and RN requests therapy continue to hold for today  Britt Bottom 01/23/2021, 2:17 PM

## 2021-01-23 NOTE — Progress Notes (Signed)
Bladder  scan completed 68 cc post void,of 300 cc. Marland Kitchen

## 2021-01-23 NOTE — Progress Notes (Signed)
PT Cancellation Note  Patient Details Name: Mariah Reed MRN: [id number] DOB: Feb 21, 1945   Cancelled Treatment:    Reason Eval/Treat Not Completed: Medical issues which prohibited therapy (Pt with RN team and MD at beside, HR in 160-170s in bed. Will follow up at later date/time when pt medically stable and as schedule allows.)   Verner Mould, DPT Acute Rehabilitation Services Office [phone] Pager [phone]

## 2021-01-23 NOTE — Progress Notes (Signed)
ANTICOAGULATION CONSULT NOTE - Initial Consult  Pharmacy Consult for Lovenox Indication: pulmonary embolus  Allergies  Allergen Reactions   Levofloxacin Other (See Comments)    Made head feel weird   Nortriptyline Other (See Comments)    Caused rectal bleeding     Patient Measurements: Height: 5\' 5"  (165.1 cm) Weight: 77.1 kg (170 lb) IBW/kg (Calculated) : 57 Heparin Dosing Weight: 73 kg  Vital Signs: Temp: 99.6 F (37.6 C) (12/19 0918) Temp Source: Oral (12/19 0918) BP: 125/67 (12/19 0918) Pulse Rate: 116 (12/19 0918)  Labs: Recent Labs    01/21/21 0149 01/21/21 0359 01/21/21 0816 01/22/21 0326 01/23/21 0424  HGB 11.5*   < > 11.6* 11.4* 11.6*  HCT 36.7   < > 37.1 37.2 37.0  PLT 76*   < > 76* 86* 103*  CREATININE 1.41*  --   --  1.00 0.82  CKTOTAL 3,434*  --   --  1,445* 564*   < > = values in this interval not displayed.    Estimated Creatinine Clearance: 59.9 mL/min (by C-G formula based on SCr of 0.82 mg/dL).   Medical History: Past Medical History:  Diagnosis Date   Allergy    COPD (chronic obstructive pulmonary disease) ([place])    Diabetes mellitus    Gout    Hyperlipidemia    Hypertension    Low back pain    Peripheral neuropathy     Medications:  Scheduled:   alum & mag hydroxide-simeth  30 mL Oral BID   Chlorhexidine Gluconate Cloth  6 each Topical Daily   diclofenac  2 patch Transdermal BID   diphenoxylate-atropine  2 tablet Oral BID   enoxaparin (LOVENOX) injection  80 mg Subcutaneous Q12H   insulin aspart  0-15 Units Subcutaneous TID WC   levothyroxine  75 mcg Oral QAC breakfast   lipase/protease/amylase  72,000 Units Oral TID with meals   metoprolol tartrate  25 mg Oral BID   NIFEdipine  60 mg Oral Daily   pantoprazole  40 mg Oral Daily   potassium chloride  40 mEq Oral Daily   pregabalin  25 mg Oral Once   pregabalin  25 mg Oral BID   tiZANidine  4 mg Oral QHS   topiramate  100 mg Oral q morning   umeclidinium bromide  1 puff  Inhalation Daily   Infusions:   piperacillin-tazobactam 3.375 g (01/23/21 0615)    Assessment: 13 yoF admitted on 12/16 with sepsis d/t pyelonephritis, rhabdomyolysis.  Pharmacy is consulted on 12/19 for acute suspected PE.  CTa pending.  No prior anticoagulation, only SCDs for VTE prophylaxis.   SCr 0.8, CrCl ~ 60 ml/min CBC: Hgb remains low/stable at 11.6.  Plt low/improved to 103 (baseline 130 on admit, decreased to 76 the next day, suspect dilutional)   Goal of Therapy:  Anti-Xa level 0.6-1 units/ml 4hrs after LMWH dose given Monitor platelets by anticoagulation protocol: Yes   Plan:  Enoxaparin 1 mg/kg (80 mg) SQ q12h Continue to monitor H&H and platelets   Gretta Arab PharmD, BCPS Clinical Pharmacist [facility] main pharmacy [phone] 01/23/2021 11:38 AM

## 2021-01-23 NOTE — Significant Event (Signed)
Rapid Response Event Note   Reason for Call :  New onset hypoxia, increased WOB, and tachycardia  Initial Focused Assessment:  Pt dyspneic on arrival, O2 93% 10L HFNC, HR 163, BP 116/63 (79), RR 32 Wheezing audible, MD at bedside.   Interventions:  STAT chest CT PRN albuterol neb Anticoagulation ordered by MD  Plan of Care:  Vital signs improved following CT scan, wheezing resolved. CT scan negative for PE. Patient will continue to be monitored on progressive floor   Event Summary:  VS following CT: O2 95% on 8L HFNC, HR 123, BP 137/62 (83). Patient appeared to be more calm upon return to room   MD Notified: Dr Wyline Copas Call Time: [place] Time: 2751 End Time: [place], RN

## 2021-01-23 NOTE — Progress Notes (Signed)
°   01/23/21 1038  Vitals  Temp 99.4 F (37.4 C)  Temp Source Rectal  BP 128/66  BP Location Left Arm  BP Method Automatic  Patient Position (if appropriate) Lying  Oxygen Therapy  SpO2 (!) 80 %  O2 Device Nasal Cannula  O2 Flow Rate (L/min) 2 L/min   The patient complained of being cold. Upon RN arrival patient experienced tachypnea. Oxygen saturation was 80% 2 lt. O2 increased to 6 lt, saturation increased to 83%. HF Olimpo applied, 8 lt, saturation increased 86-88%.RR 30-36. RRT, CN and Hospitalist provider contacted to assist.

## 2021-01-23 NOTE — Anesthesia Preprocedure Evaluation (Signed)
Anesthesia Evaluation  Patient identified by MRN, date of birth, ID band Patient awake    Reviewed: Allergy & Precautions, NPO status , Patient's Chart, lab work & pertinent test results, reviewed documented beta blocker date and time   Airway Mallampati: II  TM Distance: <3 FB Neck ROM: Full    Dental  (+) Dental Advisory Given   Pulmonary COPD,  COPD inhaler, former smoker,     + decreased breath sounds      Cardiovascular hypertension, Pt. on medications and Pt. on home beta blockers + Past MI  + dysrhythmias  Rhythm:Regular Rate:Normal     Neuro/Psych  Headaches, negative psych ROS   GI/Hepatic Neg liver ROS, GERD  ,  Endo/Other  diabetesHypothyroidism   Renal/GU      Musculoskeletal  (+) Arthritis ,   Abdominal Normal abdominal exam  (+)   Peds  Hematology   Anesthesia Other Findings   Reproductive/Obstetrics                             Anesthesia Physical Anesthesia Plan  ASA: 3  Anesthesia Plan: General   Post-op Pain Management:    Induction: Intravenous  PONV Risk Score and Plan: 4 or greater and Ondansetron, Dexamethasone and Treatment may vary due to age or medical condition  Airway Management Planned: LMA  Additional Equipment: None  Intra-op Plan:   Post-operative Plan: Extubation in OR  Informed Consent: I have reviewed the patients History and Physical, chart, labs and discussed the procedure including the risks, benefits and alternatives for the proposed anesthesia with the patient or authorized representative who has indicated his/her understanding and acceptance.       Plan Discussed with: CRNA  Anesthesia Plan Comments:         Anesthesia Quick Evaluation

## 2021-01-23 NOTE — Progress Notes (Addendum)
PROGRESS NOTE    Mariah Reed  YTK:[id number] DOB: 1945-12-28 DOA: 01/20/2021 PCP: Gladstone Lighter, MD    Brief Narrative:  75 y.o. female with medical history significant of seasonal allergies, COPD, type II DM, gout, hyperlipidemia, hypertension, chronic lower back pain, peripheral neuropathy who is coming from [facility] ED to the emergency department due to severe sepsis in the setting of acute emphysematous pyelonephritis  Assessment & Plan:   Principal Problem:   Acute pyonephrosis Active Problems:   Diabetes mellitus type 2 with complications ([place])   HLD (hyperlipidemia)   GOUT   Essential hypertension   COPD (chronic obstructive pulmonary disease) ([place])   Gastroesophageal reflux disease without esophagitis   Hypothyroidism   Severe sepsis (HCC)   Hydronephrosis of right kidney   Hydroureter on right   Rhabdomyolysis  Principal Problem:   Severe sepsis POA secondary to Acute pyonephrosis with Hydronephrosis of right kidney and Hydroureter on right Status post retrogrades cystoscopy, Staghorn stone removal and stent placement. Continued on Zosyn every 8 hours. Blood cultures negative thus far Urine cultures thus far pos for gm neg rods, pending speciation Urology consult and intervention appreciated. Will require PCNL to treat large 4cm staghorn stone, can be arranged as outpatient -Urine cx pos for ecoli and proteus. Rec culture specific abx x 2 weeks   Active Problems:   Rhabdomyolysis Now off IVF CK has trended down to 564 Hold statin.     Diabetes mellitus type 2 with complications (HCC) Carbohydrate modified diet.     HLD (hyperlipidemia) Hold lovastatin as per above     GOUT Continue allopurinol.     Essential hypertension Continue Procardia XL 60 mg p.o. daily. Started metoprolol 25mg  bid, see below Blood pressure remains stable at this time     COPD (chronic obstructive pulmonary disease) (HCC) Nebs as needed This AM, requiring  as high as 8L high flow O2 Ordered and reviewed CTA chest. Neg for PE, however evidence of  B effusions with findings suggestive of fluid     Gastroesophageal reflux disease without esophagitis Continue PPI.     Hypothyroidism Continue levothyroxine 75 mcg p.o. daily as tolerated   Tachycardia New finding this AM. Noted to have HR into the 140's Ordered scheduled metoprolol with PRN labetalol  HR has since normalized by the afternoon  Acute hypoxemia respiratory  New finding this AM O2 requirement up to [facility] this AM Given CT chest findings of effusion with concerns of fluid, have given trial of IV lasix Thus far, pt is voiding and is now appearing more comfortable Cont to wean O2 as tolerated   DVT prophylaxis: SCD's Code Status: Full Family Communication: Pt in room, family not at bedside  Status is: Inpatient  Remains inpatient appropriate because: Severity of illness    Consultants:  Urology  Procedures:  Cystoscopy with retrograde pyelogram and stent placement  Antimicrobials: Anti-infectives (From admission, onward)    Start     Dose/Rate Route Frequency Ordered Stop   01/20/21 1800  piperacillin-tazobactam (ZOSYN) IVPB 3.375 g        3.375 g 12.5 mL/hr over 240 Minutes Intravenous Every 8 hours 01/20/21 1605     01/20/21 1045  piperacillin-tazobactam (ZOSYN) IVPB 3.375 g        3.375 g 12.5 mL/hr over 240 Minutes Intravenous NOW 01/20/21 1038 01/20/21 1558   01/20/21 0800  piperacillin-tazobactam (ZOSYN) IVPB 3.375 g        3.375 g 12.5 mL/hr over 240 Minutes Intravenous Once 01/20/21  0759 01/20/21 0940       Subjective: SOB this AM  Objective: Vitals:   01/23/21 1055 01/23/21 1129 01/23/21 1140 01/23/21 1336  BP: (!) 146/105  137/62 (!) 109/53  Pulse:    (!) 103  Resp: (!) 32  (!) 30 18  Temp:      TempSrc:      SpO2: 97% 95% 97% 96%  Weight:      Height:        Intake/Output Summary (Last 24 hours) at 01/23/2021 1620 Last data filed at  01/23/2021 1508 Gross per 24 hour  Intake 644.16 ml  Output 1700 ml  Net -1055.84 ml    Filed Weights   01/20/21 0600 01/20/21 1021 01/20/21 1151  Weight: 78.9 kg 79.4 kg 77.1 kg    Examination: General exam: Seen this AM during Rapid Response. Pt laying in bed, appears very sob with shallow breaths Respiratory system: Increased respiratory effort, no  audible wheezing, decreased BS Cardiovascular system: tachycardic, s1, s2 Gastrointestinal system: Soft, nondistended, positive BS Central nervous system: CN2-12 grossly intact, strength intact Extremities: Perfused, no clubbing Skin: Normal skin turgor, no notable skin lesions seen Psychiatry: Mood normal // no visual hallucinations   Data Reviewed: I have personally reviewed following labs and imaging studies  CBC: Recent Labs  Lab 01/20/21 0830 01/21/21 0149 01/21/21 0359 01/21/21 0816 01/22/21 0326 01/23/21 0424  WBC 17.9* 12.7* 11.1* 10.1 10.2 7.8  NEUTROABS 16.6*  --   --   --   --   --   HGB 14.6 11.5* 11.2* 11.6* 11.4* 11.6*  HCT 45.5 36.7 35.8* 37.1 37.2 37.0  MCV 89.4 89.5 90.4 89.8 91.0 89.4  PLT 130* 76* 74* 76* 86* 103*    Basic Metabolic Panel: Recent Labs  Lab 01/20/21 0655 01/20/21 0830 01/21/21 0149 01/22/21 0326 01/23/21 0424  NA 136 134* 134* 135 138  K 3.6 3.2* 4.0 4.7 4.9  CL 105 106 109 111 113*  CO2  --  17* 19* 19* 20*  GLUCOSE 377* 327* 160* 186* 223*  BUN 22 20 28* 24* 16  CREATININE 1.70* 1.73* 1.41* 1.00 0.82  CALCIUM  --  8.1* 7.0* 7.4* 8.0*  MG  --   --   --   --  2.5*    GFR: Estimated Creatinine Clearance: 59.9 mL/min (by C-G formula based on SCr of 0.82 mg/dL). Liver Function Tests: Recent Labs  Lab 01/20/21 0830 01/21/21 0149 01/22/21 0326 01/23/21 0424  AST 58* 88* 64* 44*  ALT 43 38 36 30  ALKPHOS 130* 85 145* 194*  BILITOT 0.6 0.6 0.6 0.9  PROT 5.9* 4.9* 5.4* 5.4*  ALBUMIN 3.1* 2.6* 2.8* 2.7*    Recent Labs  Lab 01/20/21 0830  LIPASE 18    No results  for input(s): AMMONIA in the last 168 hours. Coagulation Profile: Recent Labs  Lab 01/20/21 0830  INR 1.2    Cardiac Enzymes: Recent Labs  Lab 01/20/21 0830 01/21/21 0149 01/22/21 0326 01/23/21 0424  CKTOTAL 1,774* 3,434* 1,445* 564*    BNP (last 3 results) No results for input(s): PROBNP in the last 8760 hours. HbA1C: Recent Labs    01/21/21 0149  HGBA1C 10.5*    CBG: Recent Labs  Lab 01/22/21 1137 01/22/21 1607 01/22/21 2326 01/23/21 0850 01/23/21 1155  GLUCAP 180* 211* 206* 239* 276*    Lipid Profile: No results for input(s): CHOL, HDL, LDLCALC, TRIG, CHOLHDL, LDLDIRECT in the last 72 hours. Thyroid Function Tests: No results for input(s): TSH,  T4TOTAL, FREET4, T3FREE, THYROIDAB in the last 72 hours. Anemia Panel: No results for input(s): VITAMINB12, FOLATE, FERRITIN, TIBC, IRON, RETICCTPCT in the last 72 hours. Sepsis Labs: Recent Labs  Lab 01/20/21 0635 01/20/21 0830 01/21/21 0149 01/21/21 0359  LATICACIDVEN 8.1* 4.1* 1.6 1.8     Recent Results (from the past 240 hour(s))  Blood Culture (routine x 2)     Status: None (Preliminary result)   Collection Time: 01/20/21  8:10 AM   Specimen: BLOOD  Result Value Ref Range Status   Specimen Description BLOOD BLOOD LEFT HAND  Final   Special Requests   Final    BOTTLES DRAWN AEROBIC AND ANAEROBIC Blood Culture results may not be optimal due to an excessive volume of blood received in culture bottles   Culture   Final    NO GROWTH 3 DAYS Performed at [facility], [place] [address], [place], [place] [address]    Report Status PENDING  Incomplete  Resp Panel by RT-PCR (Flu A&B, Covid) Nasopharyngeal Swab     Status: None   Collection Time: 01/20/21  8:14 AM   Specimen: Nasopharyngeal Swab; Nasopharyngeal(NP) swabs in vial transport medium  Result Value Ref Range Status   SARS Coronavirus 2 by RT PCR NEGATIVE NEGATIVE Final    Comment: (NOTE) SARS-CoV-2 target nucleic acids are NOT  DETECTED.  The SARS-CoV-2 RNA is generally detectable in upper respiratory specimens during the acute phase of infection. The lowest concentration of SARS-CoV-2 viral copies this assay can detect is 138 copies/mL. A negative result does not preclude SARS-Cov-2 infection and should not be used as the sole basis for treatment or other patient management decisions. A negative result may occur with  improper specimen collection/handling, submission of specimen other than nasopharyngeal swab, presence of viral mutation(s) within the areas targeted by this assay, and inadequate number of viral copies(<138 copies/mL). A negative result must be combined with clinical observations, patient history, and epidemiological information. The expected result is Negative.  Fact Sheet for Patients:  EntrepreneurPulse.com.au  Fact Sheet for Healthcare Providers:  IncredibleEmployment.be  This test is no t yet approved or cleared by the [place] FDA and  has been authorized for detection and/or diagnosis of SARS-CoV-2 by FDA under an Emergency Use Authorization (EUA). This EUA will remain  in effect (meaning this test can be used) for the duration of the COVID-19 declaration under Section 564(b)(1) of the Act, 21 U.S.C.section 360bbb-3(b)(1), unless the authorization is terminated  or revoked sooner.       Influenza A by PCR NEGATIVE NEGATIVE Final   Influenza B by PCR NEGATIVE NEGATIVE Final    Comment: (NOTE) The Xpert Xpress SARS-CoV-2/FLU/RSV plus assay is intended as an aid in the diagnosis of influenza from Nasopharyngeal swab specimens and should not be used as a sole basis for treatment. Nasal washings and aspirates are unacceptable for Xpert Xpress SARS-CoV-2/FLU/RSV testing.  Fact Sheet for Patients: EntrepreneurPulse.com.au  Fact Sheet for Healthcare Providers: IncredibleEmployment.be  This test is not yet  approved or cleared by the [place] FDA and has been authorized for detection and/or diagnosis of SARS-CoV-2 by FDA under an Emergency Use Authorization (EUA). This EUA will remain in effect (meaning this test can be used) for the duration of the COVID-19 declaration under Section 564(b)(1) of the Act, 21 U.S.C. section 360bbb-3(b)(1), unless the authorization is terminated or revoked.  Performed at [facility], [place] [address], [place], [place] [address]   Blood Culture (routine x 2)  Status: None (Preliminary result)   Collection Time: 01/20/21 10:50 AM   Specimen: BLOOD  Result Value Ref Range Status   Specimen Description   Final    BLOOD RIGHT ANTECUBITAL Performed at [facility] [address], [place], [place] [address]    Special Requests   Final    BOTTLES DRAWN AEROBIC AND ANAEROBIC Blood Culture adequate volume Performed at [facility] [address] [place], [place] [address]    Culture   Final    NO GROWTH 3 DAYS Performed at [facility], [place] [address], [place], [place] [address]    Report Status PENDING  Incomplete  Urine Culture     Status: Abnormal   Collection Time: 01/20/21  2:21 PM   Specimen: In/Out Cath Urine  Result Value Ref Range Status   Specimen Description   Final    IN/OUT CATH URINE Performed at [facility] [address] [place], [place] [address]    Special Requests   Final    NONE Performed at [facility], [place] [address] [place], [place] [address]    Culture (A)  Final    40,000 COLONIES/mL ESCHERICHIA COLI 20,000 COLONIES/mL PROTEUS MIRABILIS    Report Status 01/23/2021 FINAL  Final   Organism ID, Bacteria ESCHERICHIA COLI (A)  Final   Organism ID, Bacteria PROTEUS MIRABILIS (A)  Final      Susceptibility   Escherichia coli - MIC*    AMPICILLIN >=32 RESISTANT Resistant     CEFAZOLIN <=4 SENSITIVE Sensitive     CEFEPIME <=0.12  SENSITIVE Sensitive     CEFTRIAXONE <=0.25 SENSITIVE Sensitive     CIPROFLOXACIN <=0.25 SENSITIVE Sensitive     GENTAMICIN <=1 SENSITIVE Sensitive     IMIPENEM <=0.25 SENSITIVE Sensitive     NITROFURANTOIN <=16 SENSITIVE Sensitive     TRIMETH/SULFA <=20 SENSITIVE Sensitive     AMPICILLIN/SULBACTAM 16 INTERMEDIATE Intermediate     PIP/TAZO <=4 SENSITIVE Sensitive     * 40,000 COLONIES/mL ESCHERICHIA COLI   Proteus mirabilis - MIC*    AMPICILLIN <=2 SENSITIVE Sensitive     CEFAZOLIN 8 SENSITIVE Sensitive     CEFEPIME <=0.12 SENSITIVE Sensitive     CEFTRIAXONE <=0.25 SENSITIVE Sensitive     CIPROFLOXACIN <=0.25 SENSITIVE Sensitive     GENTAMICIN <=1 SENSITIVE Sensitive     IMIPENEM 4 SENSITIVE Sensitive     NITROFURANTOIN RESISTANT Resistant     TRIMETH/SULFA <=20 SENSITIVE Sensitive     AMPICILLIN/SULBACTAM <=2 SENSITIVE Sensitive     PIP/TAZO <=4 SENSITIVE Sensitive     * 20,000 COLONIES/mL PROTEUS MIRABILIS  MRSA Next Gen by PCR, Nasal     Status: None   Collection Time: 01/20/21  4:04 PM   Specimen: Nasal Mucosa; Nasal Swab  Result Value Ref Range Status   MRSA by PCR Next Gen NOT DETECTED NOT DETECTED Final    Comment: (NOTE) The GeneXpert MRSA Assay (FDA approved for NASAL specimens only), is one component of a comprehensive MRSA colonization surveillance program. It is not intended to diagnose MRSA infection nor to guide or monitor treatment for MRSA infections. Test performance is not FDA approved in patients less than 71 years old. Performed at [facility], [place] [address], [place], [place] [address]       Radiology Studies: CT Angio Chest Pulmonary Embolism (PE) W or WO Contrast  Result Date: 01/23/2021 CLINICAL DATA:  Rule out pulmonary embolus. EXAM: CT ANGIOGRAPHY CHEST WITH  CONTRAST TECHNIQUE: Multidetector CT imaging of the chest was performed using the standard protocol during bolus administration of intravenous contrast. Multiplanar CT  image reconstructions and MIPs were obtained to evaluate the vascular anatomy. CONTRAST:  39mL OMNIPAQUE IOHEXOL 350 MG/ML SOLN COMPARISON:  None. FINDINGS: Cardiovascular: Exam detail is diminished secondary to respiratory motion artifact. Pulmonary arterial opacification is also suboptimal. Within these limitations there is no signs of central pulmonary artery or lobar pulmonary artery embolus. Beyond the lobar pulmonary arteries exam detail is essentially nondiagnostic for pulmonary embolus. Heart size is upper limits of normal. No pericardial effusion. Aortic atherosclerosis. Mild LAD coronary artery calcifications. Mediastinum/Nodes: No enlarged mediastinal, hilar, or axillary lymph nodes. Thyroid gland, trachea, and esophagus demonstrate no significant findings. Lungs/Pleura: Significantly degraded exam detail of the lungs due to motion artifact. Small left pleural effusion and moderate right pleural effusion identified. There is diffuse interlobular septal thickening identified suggesting interstitial edema. Airspace consolidation versus compressive type atelectasis is noted overlying the right pleural effusion, image 97/7. Subpleural consolidation is also noted within the posterior and lateral right upper lobe, image 43/7.Indeterminate subpleural nodule is identified within the anterior left upper lobe measuring 1.2 cm, image 70/7. Upper Abdomen: Images through the upper abdomen are also degraded by motion artifact. Signs of previous cholecystectomy with increase caliber of the common bile duct is again noted and appears similar to CT from 01/21/2021. Musculoskeletal: No chest wall abnormality. No acute or significant osseous findings. Review of the MIP images confirms the above findings. IMPRESSION: 1. Exam detail is diminished secondary to respiratory motion artifact and diminished pulmonary arterial opacification. Within these limitations there is no signs of central pulmonary artery or lobar pulmonary  artery embolus. Beyond the lobar pulmonary arteries exam detail is essentially nondiagnostic for pulmonary embolus. 2. Bilateral pleural effusions, right greater than left. There is, bilateral interlobular septal thickening compatible with interstitial edema. Correlate for signs or symptoms of congestive heart failure. 3. Airspace consolidation versus compressive type atelectasis is noted overlying the right pleural effusion. Underlying infection not excluded. 4. Indeterminate subpleural nodule is identified within the anterior left upper lobe measuring 1.2 cm. Repeat CT of the chest following resolution of patient's current acute clinical condition is recommended for more definitive characterization. 5. Aortic Atherosclerosis (ICD10-I70.0). Electronically Signed   By: Kerby Moors M.D.   On: 01/23/2021 11:46    Scheduled Meds:  alum & mag hydroxide-simeth  30 mL Oral BID   Chlorhexidine Gluconate Cloth  6 each Topical Daily   diclofenac  2 patch Transdermal BID   diphenoxylate-atropine  2 tablet Oral BID   enoxaparin (LOVENOX) injection  40 mg Subcutaneous Q24H   insulin aspart  0-15 Units Subcutaneous TID WC   levothyroxine  75 mcg Oral QAC breakfast   lipase/protease/amylase  72,000 Units Oral TID with meals   metoprolol tartrate  25 mg Oral BID   NIFEdipine  60 mg Oral Daily   pantoprazole  40 mg Oral Daily   potassium chloride  40 mEq Oral Daily   pregabalin  25 mg Oral Once   pregabalin  25 mg Oral BID   tiZANidine  4 mg Oral QHS   topiramate  100 mg Oral q morning   umeclidinium bromide  1 puff Inhalation Daily   Continuous Infusions:  piperacillin-tazobactam 12.5 mL/hr at 01/23/21 1508     LOS: 3 days   Marylu Lund, MD [facility] Pager On Amion  If 7PM-7AM, please contact night-coverage 01/23/2021, 4:20 PM

## 2021-01-24 ENCOUNTER — Inpatient Hospital Stay (HOSPITAL_COMMUNITY): Payer: Medicare Other

## 2021-01-24 DIAGNOSIS — I10 Essential (primary) hypertension: Secondary | ICD-10-CM

## 2021-01-24 DIAGNOSIS — N136 Pyonephrosis: Secondary | ICD-10-CM | POA: Diagnosis not present

## 2021-01-24 LAB — ECHOCARDIOGRAM COMPLETE
AR max vel: 1.48 cm2
AV Area VTI: 1.47 cm2
AV Area mean vel: 1.45 cm2
AV Mean grad: 5 mmHg
AV Peak grad: 9.9 mmHg
Ao pk vel: 1.57 m/s
Area-P 1/2: 3.81 cm2
Calc EF: 52.6 %
Height: 65 in
S' Lateral: 2.8 cm
Single Plane A2C EF: 51.7 %
Single Plane A4C EF: 50.8 %
Weight: 2720 oz

## 2021-01-24 LAB — COMPREHENSIVE METABOLIC PANEL
ALT: 29 U/L (ref 0–44)
AST: 27 U/L (ref 15–41)
Albumin: 2.6 g/dL — ABNORMAL LOW (ref 3.5–5.0)
Alkaline Phosphatase: 208 U/L — ABNORMAL HIGH (ref 38–126)
Anion gap: 7 (ref 5–15)
BUN: 17 mg/dL (ref 8–23)
CO2: 22 mmol/L (ref 22–32)
Calcium: 8 mg/dL — ABNORMAL LOW (ref 8.9–10.3)
Chloride: 109 mmol/L (ref 98–111)
Creatinine, Ser: 0.91 mg/dL (ref 0.44–1.00)
GFR, Estimated: >60 mL/min (ref >60)
Glucose, Bld: 166 mg/dL — ABNORMAL HIGH (ref 70–99)
Potassium: 4 mmol/L (ref 3.5–5.1)
Sodium: 138 mmol/L (ref 135–145)
Total Bilirubin: 0.8 mg/dL (ref 0.3–1.2)
Total Protein: 5.5 g/dL — ABNORMAL LOW (ref 6.5–8.1)

## 2021-01-24 LAB — CBC
HCT: 37.5 % (ref 36.0–46.0)
Hemoglobin: 11.9 g/dL — ABNORMAL LOW (ref 12.0–15.0)
MCH: 28.1 pg (ref 26.0–34.0)
MCHC: 31.7 g/dL (ref 30.0–36.0)
MCV: 88.4 fL (ref 80.0–100.0)
Platelets: 119 10*3/uL — ABNORMAL LOW (ref 150–400)
RBC: 4.24 MIL/uL (ref 3.87–5.11)
RDW: 14.4 % (ref 11.5–15.5)
WBC: 8.7 10*3/uL (ref 4.0–10.5)
nRBC: 0 % (ref 0.0–0.2)

## 2021-01-24 LAB — GLUCOSE, CAPILLARY
Glucose-Capillary: 169 mg/dL — ABNORMAL HIGH (ref 70–99)
Glucose-Capillary: 175 mg/dL — ABNORMAL HIGH (ref 70–99)
Glucose-Capillary: 200 mg/dL — ABNORMAL HIGH (ref 70–99)
Glucose-Capillary: 197 mg/dL — ABNORMAL HIGH (ref 70–99)

## 2021-01-24 LAB — CK: Total CK: 188 U/L (ref 38–234)

## 2021-01-24 MED ORDER — GUAIFENESIN-DM 100-10 MG/5ML PO SYRP
5.0000 mL | ORAL_SOLUTION | ORAL | Status: DC | PRN
  Administered 2021-01-26 – 2021-01-28 (×2): 5 mL via ORAL
  Filled 2021-01-24 (×3): qty 10

## 2021-01-24 MED ORDER — METOPROLOL TARTRATE 25 MG PO TABS
12.5000 mg | ORAL_TABLET | Freq: Two times a day (BID) | ORAL | Status: DC
  Administered 2021-01-24 – 2021-01-29 (×11): 12.5 mg via ORAL
  Filled 2021-01-24 (×12): qty 1

## 2021-01-24 MED ORDER — NIFEDIPINE ER OSMOTIC RELEASE 30 MG PO TB24
30.0000 mg | ORAL_TABLET | Freq: Every day | ORAL | Status: DC
  Administered 2021-01-24 – 2021-01-29 (×5): 30 mg via ORAL
  Filled 2021-01-24 (×6): qty 1

## 2021-01-24 MED ORDER — BENZONATATE 100 MG PO CAPS
100.0000 mg | ORAL_CAPSULE | Freq: Three times a day (TID) | ORAL | Status: DC | PRN
  Administered 2021-01-24 – 2021-01-26 (×5): 100 mg via ORAL
  Filled 2021-01-24 (×5): qty 1

## 2021-01-24 MED ORDER — SODIUM CHLORIDE 0.9 % IV SOLN: INTRAVENOUS | Status: DC | PRN

## 2021-01-24 MED ORDER — ORAL CARE MOUTH RINSE
15.0000 mL | Freq: Two times a day (BID) | OROMUCOSAL | Status: DC
  Administered 2021-01-25 – 2021-01-29 (×4): 15 mL via OROMUCOSAL

## 2021-01-24 MED ORDER — FUROSEMIDE 10 MG/ML IJ SOLN
40.0000 mg | Freq: Once | INTRAMUSCULAR | Status: AC
  Administered 2021-01-24: 15:00:00 40 mg via INTRAVENOUS
  Filled 2021-01-24: qty 4

## 2021-01-24 NOTE — TOC Progression Note (Signed)
Transition of Care [facility]) - Progression Note    Patient Details  Name: GESELLE CARDOSA MRN: [id number] Date of Birth: Jun 10, 1945  Transition of Care [facility]) CM/SW Contact  Leeroy Cha, RN Phone Number: 01/24/2021, 8:00 AM  Clinical Narrative:     Transition of Care [facility]) Screening Note   Patient Details  Name: YUMA BLUCHER Date of Birth: Aug 04, 1945   Transition of Care [facility]) CM/SW Contact:    Leeroy Cha, RN Phone Number: 01/24/2021, 8:00 AM    Transition of Care Department [facility]) has reviewed patient and no TOC needs have been identified at this time. We will continue to monitor patient advancement through interdisciplinary progression rounds. If new patient transition needs arise, please place a TOC consult.     Expected Discharge Plan: [place] Barriers to Discharge: No Barriers Identified  Expected Discharge Plan and Services Expected Discharge Plan: [place] arrangements for the past 2 months: Single Family Home                                       Social Determinants of Health (SDOH) Interventions    Readmission Risk Interventions No flowsheet data found.

## 2021-01-24 NOTE — Evaluation (Signed)
Occupational Therapy Evaluation Patient Details Name: Mariah Reed MRN: [id number] DOB: [birth year] Today's Date: 01/24/2021   History of Present Illness Mariah Reed is a 75 y.o. female with medical history significant of seasonal allergies, COPD, type II DM, gout, hyperlipidemia, hypertension, chronic lower back pain, peripheral neuropathy who is coming from [facility] ED to the emergency department due to severe sepsis in the setting of acute emphysematous pyelonephritis.   Clinical Impression   Pt presents with decline in function and safety with ADLs and ADL mobility with impaired strength, balance and endurance. PTA pt lives at home alone and was Ind with UB/LB ADLs, grooming, self feeding, toileting and mod I with transfers. Pt currently requires mod  with LB selfcare, min A with transfers. Pt would benefit form acute OT services to address impairments to maximize level of function and safety     Recommendations for follow up therapy are one component of a multi-disciplinary discharge planning process, led by the attending physician.  Recommendations may be updated based on patient status, additional functional criteria and insurance authorization.   Follow Up Recommendations  Home health OT    Assistance Recommended at Discharge Intermittent Supervision/Assistance  Functional Status Assessment  Patient has had a recent decline in their functional status and demonstrates the ability to make significant improvements in function in a reasonable and predictable amount of time.  Equipment Recommendations  None recommended by OT    Recommendations for Other Services       Precautions / Restrictions Precautions Precautions: Fall Restrictions Weight Bearing Restrictions: No      Mobility Bed Mobility Overal bed mobility: Needs Assistance Bed Mobility: Supine to Sit;Sit to Supine     Supine to sit: Min assist Sit to supine: Min assist         Transfers Overall transfer level: Needs assistance   Transfers: Sit to/from Stand;Bed to chair/wheelchair/BSC Sit to Stand: Min assist Stand pivot transfers: Min assist                Balance Overall balance assessment: Needs assistance Sitting-balance support: No upper extremity supported Sitting balance-Leahy Scale: Good     Standing balance support: Bilateral upper extremity supported;During functional activity Standing balance-Leahy Scale: Poor                             ADL either performed or assessed with clinical judgement   ADL Overall ADL's : Needs assistance/impaired Eating/Feeding: Set up;Independent;Sitting   Grooming: Wash/dry hands;Wash/dry face;Set up;Supervision/safety   Upper Body Bathing: Min guard;Sitting   Lower Body Bathing: Moderate assistance   Upper Body Dressing : Min guard;Sitting   Lower Body Dressing: Moderate assistance   Toilet Transfer: Minimal assistance;Cueing for safety;Stand-pivot   Toileting- Clothing Manipulation and Hygiene: Moderate assistance;Sit to/from stand       Functional mobility during ADLs: Minimal assistance;Cueing for safety General ADL Comments: Pt on 2L O2 since hospitalization, does not wear O2 at home     Vision Baseline Vision/History: 1 Wears glasses Ability to See in Adequate Light: 0 Adequate Patient Visual Report: No change from baseline       Perception     Praxis      Pertinent Vitals/Pain Pain Assessment: No/denies pain Pain Score: 0-No pain Pain Intervention(s): Monitored during session;Repositioned     Hand Dominance Right   Extremity/Trunk Assessment Upper Extremity Assessment Upper Extremity Assessment: Generalized weakness;LUE deficits/detail LUE Deficits / Details: hx of L forearm  deformity since 75 years old   Lower Extremity Assessment Lower Extremity Assessment: Defer to PT evaluation   Cervical / Trunk Assessment Cervical / Trunk Assessment: Normal    Communication Communication Communication: No difficulties   Cognition Arousal/Alertness: Awake/alert Behavior During Therapy: WFL for tasks assessed/performed Overall Cognitive Status: Within Functional Limits for tasks assessed                                       General Comments       Exercises     Shoulder Instructions      Home Living Family/patient expects to be discharged to:: Private residence Living Arrangements: Alone Available Help at Discharge: Available PRN/intermittently Type of Home: Apartment Home Access: Level entry     Home Layout: One level     Bathroom Shower/Tub: Teacher, early years/pre: Handicapped height     Home Equipment: [place] - single point;Shower seat          Prior Functioning/Environment Prior Level of Function : Independent/Modified Independent;Driving       Physical Assist : ADLs (physical)   ADLs (physical): IADLs Mobility Comments: pt states that she uses a cane sometimes ADLs Comments: Pt was Ind with ADLs/selfcare. Has aides for home mgt but states that she "fired them all". Pt has assist with grocery shopping        OT Problem List: Decreased strength;Impaired balance (sitting and/or standing);Decreased activity tolerance;Impaired UE functional use      OT Treatment/Interventions: Self-care/ADL training;Patient/family education;Balance training;Therapeutic activities;DME and/or AE instruction    OT Goals(Current goals can be found in the care plan section) Acute Rehab OT Goals Patient Stated Goal: go home OT Goal Formulation: With patient Time For Goal Achievement: 02/07/21 ADL Goals Pt Will Perform Grooming: with supervision;with set-up;standing Pt Will Perform Upper Body Bathing: with supervision;with set-up;with modified independence;sitting Pt Will Perform Lower Body Bathing: with min assist;with min guard assist Pt Will Perform Upper Body Dressing: with supervision;with set-up;with  modified independence;sitting Pt Will Perform Lower Body Dressing: with min assist;with min guard assist Pt Will Transfer to Toilet: with min guard assist;with supervision;stand pivot transfer;ambulating Pt Will Perform Toileting - Clothing Manipulation and hygiene: with min assist;with min guard assist;sit to/from stand  OT Frequency: Min 2X/week   Barriers to D/C:            Co-evaluation              AM-PAC OT "6 Clicks" Daily Activity     Outcome Measure Help from another person eating meals?: A Little Help from another person taking care of personal grooming?: A Little Help from another person toileting, which includes using toliet, bedpan, or urinal?: A Lot Help from another person bathing (including washing, rinsing, drying)?: A Lot Help from another person to put on and taking off regular upper body clothing?: A Little Help from another person to put on and taking off regular lower body clothing?: A Lot 6 Click Score: 15   End of Session Equipment Utilized During Treatment: Gait belt  Activity Tolerance: Patient limited by fatigue Patient left: in bed;with call bell/phone within reach  OT Visit Diagnosis: Muscle weakness (generalized) (M62.81);Other abnormalities of gait and mobility (R26.89)                Time: 1141-1200 OT Time Calculation (min): 19 min Charges:  OT Evaluation $OT Eval Moderate Complexity: 1 Mod  Mariah Reed [facility] 01/24/2021, 2:34 PM

## 2021-01-24 NOTE — Progress Notes (Signed)
4 Days Post-Op Subjective: Stays feels more unwell today, nausea, requiring 8L [place]. Denies pain. Afebrile.   Objective: Vital signs in last 24 hours: Temp:  [98.2 F (36.8 C)-99.6 F (37.6 C)] 98.2 F (36.8 C) (12/20 0443) Pulse Rate:  [62-116] 62 (12/20 0443) Resp:  [18-32] 18 (12/20 0443) BP: (86-146)/(51-105) 86/51 (12/20 0443) SpO2:  [80 %-97 %] 92 % (12/20 0443)  Intake/Output from previous day: 12/19 0701 - 12/20 0700 In: 281 [P.O.:120; IV Piggyback:161] Out: 1250 [Urine:1250] Intake/Output this shift: No intake/output data recorded. UOP: 1.2L clear yellow via pure wick  Physical Exam:  General: Alert and oriented CV: RRR Lungs: Clear Abdomen: Soft, ND, NT Ext: NT, No erythema  Lab Results: Recent Labs    01/22/21 0326 01/23/21 0424 01/24/21 0450  HGB 11.4* 11.6* 11.9*  HCT 37.2 37.0 37.5   BMET Recent Labs    01/23/21 0424 01/24/21 0450  NA 138 138  K 4.9 4.0  CL 113* 109  CO2 20* 22  GLUCOSE 223* 166*  BUN 16 17  CREATININE 0.82 0.91  CALCIUM 8.0* 8.0*     Studies/Results: CT Angio Chest Pulmonary Embolism (PE) W or WO Contrast  Result Date: 01/23/2021 CLINICAL DATA:  Rule out pulmonary embolus. EXAM: CT ANGIOGRAPHY CHEST WITH CONTRAST TECHNIQUE: Multidetector CT imaging of the chest was performed using the standard protocol during bolus administration of intravenous contrast. Multiplanar CT image reconstructions and MIPs were obtained to evaluate the vascular anatomy. CONTRAST:  41mL OMNIPAQUE IOHEXOL 350 MG/ML SOLN COMPARISON:  None. FINDINGS: Cardiovascular: Exam detail is diminished secondary to respiratory motion artifact. Pulmonary arterial opacification is also suboptimal. Within these limitations there is no signs of central pulmonary artery or lobar pulmonary artery embolus. Beyond the lobar pulmonary arteries exam detail is essentially nondiagnostic for pulmonary embolus. Heart size is upper limits of normal. No pericardial effusion. Aortic  atherosclerosis. Mild LAD coronary artery calcifications. Mediastinum/Nodes: No enlarged mediastinal, hilar, or axillary lymph nodes. Thyroid gland, trachea, and esophagus demonstrate no significant findings. Lungs/Pleura: Significantly degraded exam detail of the lungs due to motion artifact. Small left pleural effusion and moderate right pleural effusion identified. There is diffuse interlobular septal thickening identified suggesting interstitial edema. Airspace consolidation versus compressive type atelectasis is noted overlying the right pleural effusion, image 97/7. Subpleural consolidation is also noted within the posterior and lateral right upper lobe, image 43/7.Indeterminate subpleural nodule is identified within the anterior left upper lobe measuring 1.2 cm, image 70/7. Upper Abdomen: Images through the upper abdomen are also degraded by motion artifact. Signs of previous cholecystectomy with increase caliber of the common bile duct is again noted and appears similar to CT from 01/21/2021. Musculoskeletal: No chest wall abnormality. No acute or significant osseous findings. Review of the MIP images confirms the above findings. IMPRESSION: 1. Exam detail is diminished secondary to respiratory motion artifact and diminished pulmonary arterial opacification. Within these limitations there is no signs of central pulmonary artery or lobar pulmonary artery embolus. Beyond the lobar pulmonary arteries exam detail is essentially nondiagnostic for pulmonary embolus. 2. Bilateral pleural effusions, right greater than left. There is, bilateral interlobular septal thickening compatible with interstitial edema. Correlate for signs or symptoms of congestive heart failure. 3. Airspace consolidation versus compressive type atelectasis is noted overlying the right pleural effusion. Underlying infection not excluded. 4. Indeterminate subpleural nodule is identified within the anterior left upper lobe measuring 1.2 cm.  Repeat CT of the chest following resolution of patient's current acute clinical condition is recommended for more definitive  characterization. 5. Aortic Atherosclerosis (ICD10-I70.0). Electronically Signed   By: Kerby Moors M.D.   On: 01/23/2021 11:46    Assessment/Plan: 1. 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  Afebrile, WBC 17.9, creatinine 1.7, urinalysis indicating infection. S/p cysto, R RPG, R stent 01/20/2021. 2. AKI: Creatinine 1.7 from baseline of 1 at time of consultation. Improving 3. Distal right ureteral and large staghorn right renal stone (4x1.7x1.9cm) 4. Rhabdomyolysis: CK trended down to 564 5. Acute hypoxemia respiratory failure: Requiring 8L [place]. Pleural effusion on CT, requiring lasix 6. PMH of DM, HTN, HLD, Gout   -Continue broad spectrum antibiotics -F/u Urine cx 40K e coli and 20K proteus. Blood cx negative. Will require culture specific abx for 2 weeks. -Will repeat CT A/P non-con today to assess for resolution of gas -Discussed that she will require PCNL to treat her large 4cm staghorn stone. This will be done outpatient. Arranged followup. -I have attempted to call both of her daughters while standing with her at bedside.  Unfortunately the numbers that she is providing me are not in service.  She has no other family member that she would like for me to call.  She understands that given his history of emphysematous pyelitis, although she is making a great recovery is associated with a 20% mortality rate -I will share her cell number with my office staff to arrange for f/u appointment: [phone] -Following   LOS: 4 days   Matt R. Dujuan Stankowski MD 01/24/2021, 8:01 AM [facility]   Pager: [phone]

## 2021-01-24 NOTE — Plan of Care (Signed)
No acute events overnight.  Problem: Education: Goal: Knowledge of General Education information will improve Description: Including pain rating scale, medication(s)/side effects and non-pharmacologic comfort measures Outcome: Progressing   Problem: Health Behavior/Discharge Planning: Goal: Ability to manage health-related needs will improve Outcome: Progressing   Problem: Clinical Measurements: Goal: Ability to maintain clinical measurements within normal limits will improve Outcome: Progressing Goal: Will remain free from infection Outcome: Progressing Goal: Diagnostic test results will improve Outcome: Progressing Goal: Respiratory complications will improve Outcome: Progressing Goal: Cardiovascular complication will be avoided Outcome: Progressing   Problem: Activity: Goal: Risk for activity intolerance will decrease Outcome: Progressing   Problem: Nutrition: Goal: Adequate nutrition will be maintained Outcome: Progressing   Problem: Coping: Goal: Level of anxiety will decrease Outcome: Progressing   Problem: Elimination: Goal: Will not experience complications related to bowel motility Outcome: Progressing Goal: Will not experience complications related to urinary retention Outcome: Progressing   Problem: Pain Managment: Goal: General experience of comfort will improve Outcome: Progressing   Problem: Safety: Goal: Ability to remain free from injury will improve Outcome: Progressing   Problem: Skin Integrity: Goal: Risk for impaired skin integrity will decrease Outcome: Progressing   Problem: Fluid Volume: Goal: Hemodynamic stability will improve Outcome: Progressing   Problem: Clinical Measurements: Goal: Diagnostic test results will improve Outcome: Progressing Goal: Signs and symptoms of infection will decrease Outcome: Progressing   Problem: Respiratory: Goal: Ability to maintain adequate ventilation will improve Outcome: Progressing    Problem: Urinary Elimination: Goal: Signs and symptoms of infection will decrease Outcome: Progressing

## 2021-01-24 NOTE — Progress Notes (Signed)
PROGRESS NOTE    Mariah Reed  TKW:[id number] DOB: 1945-09-30 DOA: 01/20/2021 PCP: Gladstone Lighter, MD    Brief Narrative:  75 y.o. female with medical history significant of seasonal allergies, COPD, type II DM, gout, hyperlipidemia, hypertension, chronic lower back pain, peripheral neuropathy who is coming from [facility] ED to the emergency department due to severe sepsis in the setting of acute emphysematous pyelonephritis  Assessment & Plan:   Principal Problem:   Acute pyonephrosis Active Problems:   Diabetes mellitus type 2 with complications ([place])   HLD (hyperlipidemia)   GOUT   Essential hypertension   COPD (chronic obstructive pulmonary disease) ([place])   Gastroesophageal reflux disease without esophagitis   Hypothyroidism   Severe sepsis (HCC)   Hydronephrosis of right kidney   Hydroureter on right   Rhabdomyolysis  Principal Problem:   Severe sepsis POA secondary to Acute pyonephrosis with Hydronephrosis of right kidney and Hydroureter on right Status post retrogrades cystoscopy, Staghorn stone removal and stent placement. Continued on Zosyn every 8 hours. Blood cultures negative Urine cultures pos for ecoli resistant to ampicillin, proteus resistant to nitrofurantoin Urology consult and intervention appreciated. Will require PCNL to treat large 4cm staghorn stone, can be arranged as outpatient -Urology recommends culture specific abx x 2 weeks   Active Problems:   Rhabdomyolysis Resolved Now off IVF Hold statin.     Diabetes mellitus type 2 with complications (HCC) Carbohydrate modified diet.     HLD (hyperlipidemia) Hold lovastatin as per above     GOUT Continue allopurinol.     Essential hypertension Continue Procardia XL, decreased to 30mg  given soft bp this AM. Started metoprolol 25mg  bid, see below Blood pressure remains stable at this time Given soft BP, held spironolactone     COPD (chronic obstructive pulmonary disease)  (HCC) Nebs as needed This AM, requiring as high as 8L high flow O2 Ordered and reviewed CTA chest. Neg for PE, however evidence of  B effusions with findings suggestive of fluid, see below     Gastroesophageal reflux disease without esophagitis Continue PPI.     Hypothyroidism Continue levothyroxine 75 mcg p.o. daily as tolerated   Tachycardia New finding on 12/29. Noted to have HR into the 140's Resolved after starting metoprolol  Acute hypoxemia respiratory  New finding this 12/19 O2 requirement up to [facility] now weaning Given CT chest findings of effusion with concerns of fluid, pt has received IV lasix with improvovement and O2 that is weaning Cont to wean to goal of RA   DVT prophylaxis: SCD's Code Status: Full Family Communication: Pt in room, family not at bedside  Status is: Inpatient  Remains inpatient appropriate because: Severity of illness    Consultants:  Urology  Procedures:  Cystoscopy with retrograde pyelogram and stent placement  Antimicrobials: Anti-infectives (From admission, onward)    Start     Dose/Rate Route Frequency Ordered Stop   01/20/21 1800  piperacillin-tazobactam (ZOSYN) IVPB 3.375 g        3.375 g 12.5 mL/hr over 240 Minutes Intravenous Every 8 hours 01/20/21 1605     01/20/21 1045  piperacillin-tazobactam (ZOSYN) IVPB 3.375 g        3.375 g 12.5 mL/hr over 240 Minutes Intravenous NOW 01/20/21 1038 01/20/21 1558   01/20/21 0800  piperacillin-tazobactam (ZOSYN) IVPB 3.375 g        3.375 g 12.5 mL/hr over 240 Minutes Intravenous Once 01/20/21 0759 01/20/21 0940       Subjective: Reports breathing much better today.  Still on 6LNC  Objective: Vitals:   01/23/21 2052 01/24/21 0443 01/24/21 1034 01/24/21 1239  BP: 128/66 (!) 86/51 (!) 101/48 (!) 104/51  Pulse: 84 62 77 78  Resp: 18 18  16   Temp: 98.3 F (36.8 C) 98.2 F (36.8 C)  98.4 F (36.9 C)  TempSrc: Oral Oral  Oral  SpO2: 97% 92% 95% 97%  Weight:      Height:         Intake/Output Summary (Last 24 hours) at 01/24/2021 1605 Last data filed at 01/24/2021 1226 Gross per 24 hour  Intake 549.3 ml  Output 1850 ml  Net -1300.7 ml    Filed Weights   01/20/21 0600 01/20/21 1021 01/20/21 1151  Weight: 78.9 kg 79.4 kg 77.1 kg    Examination: General exam: Awake, laying in bed, in nad Respiratory system: Normal respiratory effort, no wheezing Cardiovascular system: regular rate, s1, s2 Gastrointestinal system: Soft, nondistended, positive BS Central nervous system: CN2-12 grossly intact, strength intact Extremities: Perfused, no clubbing Skin: Normal skin turgor, no notable skin lesions seen Psychiatry: Mood normal // no visual hallucinations   Data Reviewed: I have personally reviewed following labs and imaging studies  CBC: Recent Labs  Lab 01/20/21 0830 01/21/21 0149 01/21/21 0359 01/21/21 0816 01/22/21 0326 01/23/21 0424 01/24/21 0450  WBC 17.9*   < > 11.1* 10.1 10.2 7.8 8.7  NEUTROABS 16.6*  --   --   --   --   --   --   HGB 14.6   < > 11.2* 11.6* 11.4* 11.6* 11.9*  HCT 45.5   < > 35.8* 37.1 37.2 37.0 37.5  MCV 89.4   < > 90.4 89.8 91.0 89.4 88.4  PLT 130*   < > 74* 76* 86* 103* 119*   < > = values in this interval not displayed.    Basic Metabolic Panel: Recent Labs  Lab 01/20/21 0830 01/21/21 0149 01/22/21 0326 01/23/21 0424 01/24/21 0450  NA 134* 134* 135 138 138  K 3.2* 4.0 4.7 4.9 4.0  CL 106 109 111 113* 109  CO2 17* 19* 19* 20* 22  GLUCOSE 327* 160* 186* 223* 166*  BUN 20 28* 24* 16 17  CREATININE 1.73* 1.41* 1.00 0.82 0.91  CALCIUM 8.1* 7.0* 7.4* 8.0* 8.0*  MG  --   --   --  2.5*  --     GFR: Estimated Creatinine Clearance: 54 mL/min (by C-G formula based on SCr of 0.91 mg/dL). Liver Function Tests: Recent Labs  Lab 01/20/21 0830 01/21/21 0149 01/22/21 0326 01/23/21 0424 01/24/21 0450  AST 58* 88* 64* 44* 27  ALT 43 38 36 30 29  ALKPHOS 130* 85 145* 194* 208*  BILITOT 0.6 0.6 0.6 0.9 0.8  PROT  5.9* 4.9* 5.4* 5.4* 5.5*  ALBUMIN 3.1* 2.6* 2.8* 2.7* 2.6*    Recent Labs  Lab 01/20/21 0830  LIPASE 18    No results for input(s): AMMONIA in the last 168 hours. Coagulation Profile: Recent Labs  Lab 01/20/21 0830  INR 1.2    Cardiac Enzymes: Recent Labs  Lab 01/20/21 0830 01/21/21 0149 01/22/21 0326 01/23/21 0424 01/24/21 0450  CKTOTAL 1,774* 3,434* 1,445* 564* 188    BNP (last 3 results) No results for input(s): PROBNP in the last 8760 hours. HbA1C: No results for input(s): HGBA1C in the last 72 hours.  CBG: Recent Labs  Lab 01/23/21 1155 01/23/21 1733 01/23/21 2054 01/24/21 0725 01/24/21 1101  GLUCAP 276* 220* 188* 169* 175*  Lipid Profile: No results for input(s): CHOL, HDL, LDLCALC, TRIG, CHOLHDL, LDLDIRECT in the last 72 hours. Thyroid Function Tests: No results for input(s): TSH, T4TOTAL, FREET4, T3FREE, THYROIDAB in the last 72 hours. Anemia Panel: No results for input(s): VITAMINB12, FOLATE, FERRITIN, TIBC, IRON, RETICCTPCT in the last 72 hours. Sepsis Labs: Recent Labs  Lab 01/20/21 0635 01/20/21 0830 01/21/21 0149 01/21/21 0359  LATICACIDVEN 8.1* 4.1* 1.6 1.8     Recent Results (from the past 240 hour(s))  Blood Culture (routine x 2)     Status: None (Preliminary result)   Collection Time: 01/20/21  8:10 AM   Specimen: BLOOD  Result Value Ref Range Status   Specimen Description BLOOD BLOOD LEFT HAND  Final   Special Requests   Final    BOTTLES DRAWN AEROBIC AND ANAEROBIC Blood Culture results may not be optimal due to an excessive volume of blood received in culture bottles   Culture   Final    NO GROWTH 4 DAYS Performed at [facility], [place] [address] [place], [place] [address]    Report Status PENDING  Incomplete  Resp Panel by RT-PCR (Flu A&B, Covid) Nasopharyngeal Swab     Status: None   Collection Time: 01/20/21  8:14 AM   Specimen: Nasopharyngeal Swab; Nasopharyngeal(NP) swabs in vial transport medium  Result  Value Ref Range Status   SARS Coronavirus 2 by RT PCR NEGATIVE NEGATIVE Final    Comment: (NOTE) SARS-CoV-2 target nucleic acids are NOT DETECTED.  The SARS-CoV-2 RNA is generally detectable in upper respiratory specimens during the acute phase of infection. The lowest concentration of SARS-CoV-2 viral copies this assay can detect is 138 copies/mL. A negative result does not preclude SARS-Cov-2 infection and should not be used as the sole basis for treatment or other patient management decisions. A negative result may occur with  improper specimen collection/handling, submission of specimen other than nasopharyngeal swab, presence of viral mutation(s) within the areas targeted by this assay, and inadequate number of viral copies(<138 copies/mL). A negative result must be combined with clinical observations, patient history, and epidemiological information. The expected result is Negative.  Fact Sheet for Patients:  EntrepreneurPulse.com.au  Fact Sheet for Healthcare Providers:  IncredibleEmployment.be  This test is no t yet approved or cleared by the [place] FDA and  has been authorized for detection and/or diagnosis of SARS-CoV-2 by FDA under an Emergency Use Authorization (EUA). This EUA will remain  in effect (meaning this test can be used) for the duration of the COVID-19 declaration under Section 564(b)(1) of the Act, 21 U.S.C.section 360bbb-3(b)(1), unless the authorization is terminated  or revoked sooner.       Influenza A by PCR NEGATIVE NEGATIVE Final   Influenza B by PCR NEGATIVE NEGATIVE Final    Comment: (NOTE) The Xpert Xpress SARS-CoV-2/FLU/RSV plus assay is intended as an aid in the diagnosis of influenza from Nasopharyngeal swab specimens and should not be used as a sole basis for treatment. Nasal washings and aspirates are unacceptable for Xpert Xpress SARS-CoV-2/FLU/RSV testing.  Fact Sheet for  Patients: EntrepreneurPulse.com.au  Fact Sheet for Healthcare Providers: IncredibleEmployment.be  This test is not yet approved or cleared by the [place] FDA and has been authorized for detection and/or diagnosis of SARS-CoV-2 by FDA under an Emergency Use Authorization (EUA). This EUA will remain in effect (meaning this test can be used) for the duration of the COVID-19 declaration under Section 564(b)(1) of the Act, 21 U.S.C. section 360bbb-3(b)(1), unless the authorization is  terminated or revoked.  Performed at [facility], [place] [address], [place], [place] [address]   Blood Culture (routine x 2)     Status: None (Preliminary result)   Collection Time: 01/20/21 10:50 AM   Specimen: BLOOD  Result Value Ref Range Status   Specimen Description   Final    BLOOD RIGHT ANTECUBITAL Performed at [facility] [address] [place], [place] [address]    Special Requests   Final    BOTTLES DRAWN AEROBIC AND ANAEROBIC Blood Culture adequate volume Performed at [facility] [address], [place], [place] [address]    Culture   Final    NO GROWTH 4 DAYS Performed at [facility], [place] [address], [place], [place] [address]    Report Status PENDING  Incomplete  Urine Culture     Status: Abnormal   Collection Time: 01/20/21  2:21 PM   Specimen: In/Out Cath Urine  Result Value Ref Range Status   Specimen Description   Final    IN/OUT CATH URINE Performed at [facility] [address], [place], [place] [address]    Special Requests   Final    NONE Performed at [facility], [place] [address] [place], [place] [address]    Culture (A)  Final    40,000 COLONIES/mL ESCHERICHIA COLI 20,000 COLONIES/mL PROTEUS MIRABILIS    Report Status 01/23/2021 FINAL  Final   Organism ID, Bacteria ESCHERICHIA COLI (A)  Final   Organism ID, Bacteria PROTEUS MIRABILIS  (A)  Final      Susceptibility   Escherichia coli - MIC*    AMPICILLIN >=32 RESISTANT Resistant     CEFAZOLIN <=4 SENSITIVE Sensitive     CEFEPIME <=0.12 SENSITIVE Sensitive     CEFTRIAXONE <=0.25 SENSITIVE Sensitive     CIPROFLOXACIN <=0.25 SENSITIVE Sensitive     GENTAMICIN <=1 SENSITIVE Sensitive     IMIPENEM <=0.25 SENSITIVE Sensitive     NITROFURANTOIN <=16 SENSITIVE Sensitive     TRIMETH/SULFA <=20 SENSITIVE Sensitive     AMPICILLIN/SULBACTAM 16 INTERMEDIATE Intermediate     PIP/TAZO <=4 SENSITIVE Sensitive     * 40,000 COLONIES/mL ESCHERICHIA COLI   Proteus mirabilis - MIC*    AMPICILLIN <=2 SENSITIVE Sensitive     CEFAZOLIN 8 SENSITIVE Sensitive     CEFEPIME <=0.12 SENSITIVE Sensitive     CEFTRIAXONE <=0.25 SENSITIVE Sensitive     CIPROFLOXACIN <=0.25 SENSITIVE Sensitive     GENTAMICIN <=1 SENSITIVE Sensitive     IMIPENEM 4 SENSITIVE Sensitive     NITROFURANTOIN RESISTANT Resistant     TRIMETH/SULFA <=20 SENSITIVE Sensitive     AMPICILLIN/SULBACTAM <=2 SENSITIVE Sensitive     PIP/TAZO <=4 SENSITIVE Sensitive     * 20,000 COLONIES/mL PROTEUS MIRABILIS  MRSA Next Gen by PCR, Nasal     Status: None   Collection Time: 01/20/21  4:04 PM   Specimen: Nasal Mucosa; Nasal Swab  Result Value Ref Range Status   MRSA by PCR Next Gen NOT DETECTED NOT DETECTED Final    Comment: (NOTE) The GeneXpert MRSA Assay (FDA approved for NASAL specimens only), is one component of a comprehensive MRSA colonization surveillance program. It is not intended to diagnose MRSA infection nor to guide or monitor treatment for MRSA infections. Test performance is not FDA approved in patients less than 4 years old. Performed at [facility], [place] [address] [place], [place] [address]  Radiology Studies: CT ABDOMEN PELVIS WO CONTRAST  Result Date: 01/24/2021 CLINICAL DATA:  Nephrolithiasis. EXAM: CT ABDOMEN AND PELVIS WITHOUT CONTRAST TECHNIQUE: Multidetector CT imaging  of the abdomen and pelvis was performed following the standard protocol without IV contrast. COMPARISON:  CT abdomen and pelvis 01/21/2021. FINDINGS: Lower chest: Small bilateral pleural effusions have increased. There is new right lower lobe airspace disease and new left lower lobe atelectasis. Hepatobiliary: Gallbladder surgically absent. There is stable dilatation of the common bile duct measuring 12 mm. There is no intrahepatic biliary ductal dilatation. The liver is within normal limits. Pancreas: Unremarkable. No pancreatic ductal dilatation or surrounding inflammatory changes. Spleen: Normal in size without focal abnormality. Adrenals/Urinary Tract: Right ureteral stent is unchanged in position. There is no hydronephrosis. 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 Small right peripelvic cyst is unchanged. No ureteral calculi are seen. The left kidney and ureter are within normal limits. 2.9 cm mildly hyperdense cyst in the superior pole the left kidney is unchanged. Otherwise, the left kidney and ureter are within normal limits. There is a small amount of air in the bladder. There is also hyperdensity seen throughout the bladder. The adrenal glands are within normal limits. Stomach/Bowel: Stomach is within normal limits. Appendix appears normal. No evidence of bowel wall thickening, distention, or inflammatory changes. The appendix is not seen. There is sigmoid colon diverticulosis without evidence for acute diverticulitis. Vascular/Lymphatic: Aortic atherosclerosis. No enlarged abdominal or pelvic lymph nodes. Reproductive: Status post hysterectomy. No adnexal masses. Other: No abdominal wall hernia or abnormality. No abdominopelvic ascites. There is stable mild body wall edema. Musculoskeletal: No acute or significant osseous findings. IMPRESSION: 1. Right ureteral stent is unchanged in position. There is no hydronephrosis. 2. 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  No right ureteral  calculi. 3. Small amount of air in the bladder which may be related to recent instrumentation or infection. The bladder is mildly hyperdense likely related to recent contrast administration. 4. Increasing small bilateral pleural effusions with new right lower lobe airspace disease and new left lower lobe atelectasis. 5. Body wall edema. 6.  Aortic Atherosclerosis (ICD10-I70.0). Electronically Signed   By: Ronney Asters M.D.   On: 01/24/2021 15:23   CT Angio Chest Pulmonary Embolism (PE) W or WO Contrast  Result Date: 01/23/2021 CLINICAL DATA:  Rule out pulmonary embolus. EXAM: CT ANGIOGRAPHY CHEST WITH CONTRAST TECHNIQUE: Multidetector CT imaging of the chest was performed using the standard protocol during bolus administration of intravenous contrast. Multiplanar CT image reconstructions and MIPs were obtained to evaluate the vascular anatomy. CONTRAST:  53mL OMNIPAQUE IOHEXOL 350 MG/ML SOLN COMPARISON:  None. FINDINGS: Cardiovascular: Exam detail is diminished secondary to respiratory motion artifact. Pulmonary arterial opacification is also suboptimal. Within these limitations there is no signs of central pulmonary artery or lobar pulmonary artery embolus. Beyond the lobar pulmonary arteries exam detail is essentially nondiagnostic for pulmonary embolus. Heart size is upper limits of normal. No pericardial effusion. Aortic atherosclerosis. Mild LAD coronary artery calcifications. Mediastinum/Nodes: No enlarged mediastinal, hilar, or axillary lymph nodes. Thyroid gland, trachea, and esophagus demonstrate no significant findings. Lungs/Pleura: Significantly degraded exam detail of the lungs due to motion artifact. Small left pleural effusion and moderate right pleural effusion identified. There is diffuse interlobular septal thickening identified suggesting interstitial edema. Airspace consolidation versus compressive type atelectasis is noted overlying the right pleural effusion, image 97/7. Subpleural  consolidation is also noted within the posterior and lateral right upper lobe, image 43/7.Indeterminate subpleural nodule is identified within the  anterior left upper lobe measuring 1.2 cm, image 70/7. Upper Abdomen: Images through the upper abdomen are also degraded by motion artifact. Signs of previous cholecystectomy with increase caliber of the common bile duct is again noted and appears similar to CT from 01/21/2021. Musculoskeletal: No chest wall abnormality. No acute or significant osseous findings. Review of the MIP images confirms the above findings. IMPRESSION: 1. Exam detail is diminished secondary to respiratory motion artifact and diminished pulmonary arterial opacification. Within these limitations there is no signs of central pulmonary artery or lobar pulmonary artery embolus. Beyond the lobar pulmonary arteries exam detail is essentially nondiagnostic for pulmonary embolus. 2. Bilateral pleural effusions, right greater than left. There is, bilateral interlobular septal thickening compatible with interstitial edema. Correlate for signs or symptoms of congestive heart failure. 3. Airspace consolidation versus compressive type atelectasis is noted overlying the right pleural effusion. Underlying infection not excluded. 4. Indeterminate subpleural nodule is identified within the anterior left upper lobe measuring 1.2 cm. Repeat CT of the chest following resolution of patient's current acute clinical condition is recommended for more definitive characterization. 5. Aortic Atherosclerosis (ICD10-I70.0). Electronically Signed   By: Kerby Moors M.D.   On: 01/23/2021 11:46   ECHOCARDIOGRAM COMPLETE  Result Date: 01/24/2021    ECHOCARDIOGRAM REPORT   Patient Name:   Mariah Reed Date of Exam: 01/24/2021 Medical Rec #:  [id number]       Height:       65.0 in Accession #:    [id number]      Weight:       170.0 lb Date of Birth:  18-Sep-1945        BSA:          1.846 m Patient Age:    9 years        BP:            86/51 mmHg Patient Gender: F               HR:           76 bpm. Exam Location:  Inpatient Procedure: 2D Echo, Cardiac Doppler and Color Doppler Indications:    CHF  History:        Patient has no prior history of Echocardiogram examinations.                 Previous Myocardial Infarction, COPD, Arrythmias:LBBB; Risk                 Factors:Diabetes and Hypertension.  Sonographer:    Glo Herring Referring Phys: [place]  1. Left ventricular ejection fraction, by estimation, is 50 to 55%. The left ventricle has low normal function. The left ventricle has no regional wall motion abnormalities. Left ventricular diastolic parameters were normal.  2. Right ventricular systolic function is normal. The right ventricular size is normal.  3. Left atrial size was mildly dilated.  4. The mitral valve is grossly normal. Trivial mitral valve regurgitation. No evidence of mitral stenosis.  5. The aortic valve is grossly normal. Aortic valve regurgitation is not visualized. No aortic stenosis is present.  6. The inferior vena cava is dilated in size with >50% respiratory variability, suggesting right atrial pressure of 8 mmHg. Comparison(s): No prior Echocardiogram. Conclusion(s)/Recommendation(s): Otherwise normal echocardiogram, with minor abnormalities described in the report. FINDINGS  Left Ventricle: Left ventricular ejection fraction, by estimation, is 50 to 55%. The left ventricle has low normal function. The left ventricle has no regional wall motion  abnormalities. The left ventricular internal cavity size was normal in size. There is no left ventricular hypertrophy. Abnormal (paradoxical) septal motion, consistent with left bundle branch block. Left ventricular diastolic parameters were normal. Right Ventricle: The right ventricular size is normal. Right vetricular wall thickness was not well visualized. Right ventricular systolic function is normal. Left Atrium: Left atrial size was  mildly dilated. Right Atrium: Right atrial size was normal in size. Pericardium: There is no evidence of pericardial effusion. Presence of epicardial fat layer. Mitral Valve: The mitral valve is grossly normal. Trivial mitral valve regurgitation. No evidence of mitral valve stenosis. Tricuspid Valve: The tricuspid valve is grossly normal. Tricuspid valve regurgitation is trivial. No evidence of tricuspid stenosis. Aortic Valve: The aortic valve is grossly normal. Aortic valve regurgitation is not visualized. No aortic stenosis is present. Aortic valve mean gradient measures 5.0 mmHg. Aortic valve peak gradient measures 9.9 mmHg. Aortic valve area, by VTI measures 1.47 cm. Pulmonic Valve: The pulmonic valve was not well visualized. Pulmonic valve regurgitation is not visualized. Aorta: The aortic root, ascending aorta, aortic arch and descending aorta are all structurally normal, with no evidence of dilitation or obstruction. Venous: The inferior vena cava is dilated in size with greater than 50% respiratory variability, suggesting right atrial pressure of 8 mmHg. IAS/Shunts: The interatrial septum was not well visualized.  LEFT VENTRICLE PLAX 2D LVIDd:         4.50 cm      Diastology LVIDs:         2.80 cm      LV e' medial:    7.83 cm/s LV PW:         1.00 cm      LV E/e' medial:  12.4 LV IVS:        1.00 cm      LV e' lateral:   8.05 cm/s LVOT diam:     1.85 cm      LV E/e' lateral: 12.0 LV SV:         45 LV SV Index:   25 LVOT Area:     2.69 cm  LV Volumes (MOD) LV vol d, MOD A2C: 84.7 ml LV vol d, MOD A4C: 101.0 ml LV vol s, MOD A2C: 40.9 ml LV vol s, MOD A4C: 49.7 ml LV SV MOD A2C:     43.8 ml LV SV MOD A4C:     101.0 ml LV SV MOD BP:      49.7 ml RIGHT VENTRICLE             IVC RV Basal diam:  3.20 cm     IVC diam: 2.20 cm RV Mid diam:    2.20 cm RV S prime:     12.30 cm/s LEFT ATRIUM             Index        RIGHT ATRIUM           Index LA diam:        3.50 cm 1.90 cm/m   RA Area:     14.10 cm LA Vol  (A2C):   59.0 ml 31.96 ml/m  RA Volume:   31.20 ml  16.90 ml/m LA Vol (A4C):   46.4 ml 25.13 ml/m LA Biplane Vol: 53.9 ml 29.20 ml/m  AORTIC VALVE                     PULMONIC VALVE AV Area (Vmax):    1.48 cm  PV Vmax:       0.95 m/s AV Area (Vmean):   1.45 cm      PV Peak grad:  3.6 mmHg AV Area (VTI):     1.47 cm AV Vmax:           157.00 cm/s AV Vmean:          109.000 cm/s AV VTI:            0.310 m AV Peak Grad:      9.9 mmHg AV Mean Grad:      5.0 mmHg LVOT Vmax:         86.20 cm/s LVOT Vmean:        58.800 cm/s LVOT VTI:          0.169 m LVOT/AV VTI ratio: 0.55  AORTA Ao Root diam: 2.90 cm MITRAL VALVE MV Area (PHT): 3.81 cm    SHUNTS MV Decel Time: 199 msec    Systemic VTI:  0.17 m MV E velocity: 96.80 cm/s  Systemic Diam: 1.85 cm MV A velocity: 96.40 cm/s MV E/A ratio:  1.00 Buford Dresser MD Electronically signed by Buford Dresser MD Signature Date/Time: 01/24/2021/3:15:12 PM    Final     Scheduled Meds:  alum & mag hydroxide-simeth  30 mL Oral BID   diclofenac  2 patch Transdermal BID   diphenoxylate-atropine  2 tablet Oral BID   enoxaparin (LOVENOX) injection  40 mg Subcutaneous Q24H   insulin aspart  0-15 Units Subcutaneous TID WC   levothyroxine  75 mcg Oral QAC breakfast   lipase/protease/amylase  72,000 Units Oral TID with meals   metoprolol tartrate  12.5 mg Oral BID   NIFEdipine  30 mg Oral Daily   pantoprazole  40 mg Oral Daily   potassium chloride  40 mEq Oral Daily   pregabalin  25 mg Oral Once   pregabalin  25 mg Oral BID   tiZANidine  4 mg Oral QHS   topiramate  100 mg Oral q morning   umeclidinium bromide  1 puff Inhalation Daily   Continuous Infusions:  piperacillin-tazobactam 3.375 g (01/24/21 1452)     LOS: 4 days   Marylu Lund, MD [facility] Pager On Amion  If 7PM-7AM, please contact night-coverage 01/24/2021, 4:05 PM

## 2021-01-24 NOTE — Evaluation (Deleted)
Occupational Therapy Evaluation Patient Details Name: Mariah Reed MRN: [id number] DOB: 1945/12/14 Today's Date: 01/24/2021   History of Present Illness Mariah Reed is a 75 y.o. female with medical history significant of seasonal allergies, COPD, type II DM, gout, hyperlipidemia, hypertension, chronic lower back pain, peripheral neuropathy who is coming from [facility] ED to the emergency department due to severe sepsis in the setting of acute emphysematous pyelonephritis.   Clinical Impression   Pt presents with decline in function and safety with ADLs and ADL mobility with impaired strength, balance and endurance. PTA pt lives at home alone and was Ind with UB ADLs, grooming, self feeding and mod I with transfer using RW. Pt currently requires mod  with Lb selfcare, min A with transfers. Pt wants to go home with [facility] services and is declining any further acute therapy services stating that she is going to d/c today. Recommend  OT f/u with [facility] services and no further acute OT services at this time.     Recommendations for follow up therapy are one component of a multi-disciplinary discharge planning process, led by the attending physician.  Recommendations may be updated based on patient status, additional functional criteria and insurance authorization.   Follow Up Recommendations  Home health OT    Assistance Recommended at Discharge Frequent or constant Supervision/Assistance  Functional Status Assessment  Patient has had a recent decline in their functional status and demonstrates the ability to make significant improvements in function in a reasonable and predictable amount of time.  Equipment Recommendations  None recommended by OT    Recommendations for Other Services       Precautions / Restrictions Precautions Precautions: Fall Restrictions Weight Bearing Restrictions: No      Mobility Bed Mobility Overal bed mobility: Needs Assistance Bed Mobility: Supine  to Sit;Sit to Supine     Supine to sit: Min assist Sit to supine: Min assist        Transfers Overall transfer level: Needs assistance   Transfers: Sit to/from Stand;Bed to chair/wheelchair/BSC Sit to Stand: Min assist Stand pivot transfers: Min assist                Balance Overall balance assessment: Needs assistance Sitting-balance support: No upper extremity supported Sitting balance-Leahy Scale: Good     Standing balance support: Bilateral upper extremity supported;During functional activity Standing balance-Leahy Scale: Poor                             ADL either performed or assessed with clinical judgement   ADL Overall ADL's : Needs assistance/impaired                                       General ADL Comments: Pt Ind with self feeding, set up with grooming and UB selfcare, mod A with toileting and LB selfcare     Vision Baseline Vision/History: 1 Wears glasses Ability to See in Adequate Light: 0 Adequate Patient Visual Report: No change from baseline       Perception     Praxis      Pertinent Vitals/Pain Pain Assessment: No/denies pain Pain Score: 0-No pain Pain Intervention(s): Monitored during session     Hand Dominance Right   Extremity/Trunk Assessment Upper Extremity Assessment Upper Extremity Assessment: Generalized weakness   Lower Extremity Assessment Lower Extremity Assessment: Defer to PT  evaluation       Communication Communication Communication: No difficulties   Cognition Arousal/Alertness: Awake/alert Behavior During Therapy: WFL for tasks assessed/performed Overall Cognitive Status: Within Functional Limits for tasks assessed                                       General Comments       Exercises     Shoulder Instructions      Home Living Family/patient expects to be discharged to:: Private residence   Available Help at Discharge: Personal care attendant Type of  Home: House       Home Layout: One level     Bathroom Shower/Tub: Walk-in shower;Tub/shower unit         Home Equipment: Conservation officer, nature (2 wheels);BSC/3in1          Prior Functioning/Environment Prior Level of Function : Needs assist       Physical Assist : ADLs (physical)   ADLs (physical): Dressing;Bathing            OT Problem List: Decreased strength;Impaired balance (sitting and/or standing);Decreased activity tolerance      OT Treatment/Interventions:      OT Goals(Current goals can be found in the care plan section) Acute Rehab OT Goals Patient Stated Goal: "go home today"" OT Goal Formulation: With patient  OT Frequency:     Barriers to D/C:            Co-evaluation              AM-PAC OT "6 Clicks" Daily Activity     Outcome Measure Help from another person eating meals?: None Help from another person taking care of personal grooming?: A Little Help from another person toileting, which includes using toliet, bedpan, or urinal?: A Lot Help from another person bathing (including washing, rinsing, drying)?: A Lot Help from another person to put on and taking off regular upper body clothing?: A Little Help from another person to put on and taking off regular lower body clothing?: A Lot 6 Click Score: 16   End of Session    Activity Tolerance: Patient limited by fatigue Patient left: in bed;with call bell/phone within reach  OT Visit Diagnosis: Muscle weakness (generalized) (M62.81);Other abnormalities of gait and mobility (R26.89)                Time: 1141-1200 OT Time Calculation (min): 19 min Charges:  OT Evaluation $OT Eval Moderate Complexity: 1 Mod    Britt Bottom 01/24/2021, 2:08 PM

## 2021-01-24 NOTE — Progress Notes (Signed)
PT Cancellation Note  Patient Details Name: Mariah Reed MRN: [id number] DOB: 03/29/1945   Cancelled Treatment:    Reason Eval/Treat Not Completed: Patient declined, no reason specified   Harlene Ramus 01/24/2021, 1:36 PM Arlyce Dice, DPT Acute Rehabilitation Services Pager: [phone] Office: [phone]

## 2021-01-25 DIAGNOSIS — J431 Panlobular emphysema: Secondary | ICD-10-CM

## 2021-01-25 DIAGNOSIS — N1330 Unspecified hydronephrosis: Secondary | ICD-10-CM

## 2021-01-25 DIAGNOSIS — A419 Sepsis, unspecified organism: Principal | ICD-10-CM

## 2021-01-25 DIAGNOSIS — K219 Gastro-esophageal reflux disease without esophagitis: Secondary | ICD-10-CM

## 2021-01-25 DIAGNOSIS — E039 Hypothyroidism, unspecified: Secondary | ICD-10-CM

## 2021-01-25 DIAGNOSIS — E118 Type 2 diabetes mellitus with unspecified complications: Secondary | ICD-10-CM | POA: Diagnosis not present

## 2021-01-25 DIAGNOSIS — N134 Hydroureter: Secondary | ICD-10-CM

## 2021-01-25 DIAGNOSIS — E7800 Pure hypercholesterolemia, unspecified: Secondary | ICD-10-CM

## 2021-01-25 DIAGNOSIS — N136 Pyonephrosis: Secondary | ICD-10-CM | POA: Diagnosis not present

## 2021-01-25 LAB — CULTURE, BLOOD (ROUTINE X 2)
Culture: NO GROWTH
Culture: NO GROWTH
Special Requests: ADEQUATE

## 2021-01-25 LAB — COMPREHENSIVE METABOLIC PANEL
ALT: 27 U/L (ref 0–44)
ALT: 26 U/L (ref 0–44)
AST: 22 U/L (ref 15–41)
AST: 22 U/L (ref 15–41)
Albumin: 2.8 g/dL — ABNORMAL LOW (ref 3.5–5.0)
Albumin: 2.9 g/dL — ABNORMAL LOW (ref 3.5–5.0)
Alkaline Phosphatase: 180 U/L — ABNORMAL HIGH (ref 38–126)
Alkaline Phosphatase: 187 U/L — ABNORMAL HIGH (ref 38–126)
Anion gap: 7 (ref 5–15)
Anion gap: 7 (ref 5–15)
BUN: 20 mg/dL (ref 8–23)
BUN: 15 mg/dL (ref 8–23)
CO2: 25 mmol/L (ref 22–32)
CO2: 24 mmol/L (ref 22–32)
Calcium: 8.2 mg/dL — ABNORMAL LOW (ref 8.9–10.3)
Calcium: 8.3 mg/dL — ABNORMAL LOW (ref 8.9–10.3)
Chloride: 105 mmol/L (ref 98–111)
Chloride: 109 mmol/L (ref 98–111)
Creatinine, Ser: 0.98 mg/dL (ref 0.44–1.00)
Creatinine, Ser: 0.74 mg/dL (ref 0.44–1.00)
GFR, Estimated: 60 mL/min — ABNORMAL LOW (ref >60)
GFR, Estimated: >60 mL/min (ref >60)
Glucose, Bld: 193 mg/dL — ABNORMAL HIGH (ref 70–99)
Glucose, Bld: 149 mg/dL — ABNORMAL HIGH (ref 70–99)
Potassium: 3.4 mmol/L — ABNORMAL LOW (ref 3.5–5.1)
Potassium: 3.5 mmol/L (ref 3.5–5.1)
Sodium: 137 mmol/L (ref 135–145)
Sodium: 140 mmol/L (ref 135–145)
Total Bilirubin: 0.6 mg/dL (ref 0.3–1.2)
Total Bilirubin: 0.4 mg/dL (ref 0.3–1.2)
Total Protein: 5.9 g/dL — ABNORMAL LOW (ref 6.5–8.1)
Total Protein: 6.2 g/dL — ABNORMAL LOW (ref 6.5–8.1)

## 2021-01-25 LAB — TROPONIN I (HIGH SENSITIVITY)
Troponin I (High Sensitivity): 108 ng/L (ref <18)
Troponin I (High Sensitivity): 126 ng/L (ref <18)
Troponin I (High Sensitivity): 99 ng/L — ABNORMAL HIGH (ref <18)

## 2021-01-25 LAB — MAGNESIUM: Magnesium: 2.2 mg/dL (ref 1.7–2.4)

## 2021-01-25 LAB — GLUCOSE, CAPILLARY
Glucose-Capillary: 158 mg/dL — ABNORMAL HIGH (ref 70–99)
Glucose-Capillary: 282 mg/dL — ABNORMAL HIGH (ref 70–99)
Glucose-Capillary: 136 mg/dL — ABNORMAL HIGH (ref 70–99)
Glucose-Capillary: 184 mg/dL — ABNORMAL HIGH (ref 70–99)
Glucose-Capillary: 175 mg/dL — ABNORMAL HIGH (ref 70–99)

## 2021-01-25 LAB — PHOSPHORUS: Phosphorus: 2.6 mg/dL (ref 2.5–4.6)

## 2021-01-25 LAB — CK: Total CK: 147 U/L (ref 38–234)

## 2021-01-25 MED ORDER — INSULIN DETEMIR 100 UNIT/ML ~~LOC~~ SOLN
5.0000 [IU] | SUBCUTANEOUS | Status: DC
  Administered 2021-01-25: 23:00:00 5 [IU] via SUBCUTANEOUS
  Filled 2021-01-25 (×2): qty 0.05

## 2021-01-25 MED ORDER — NITROGLYCERIN 0.4 MG SL SUBL
SUBLINGUAL_TABLET | SUBLINGUAL | Status: AC
  Administered 2021-01-25: 16:00:00 0.4 mg
  Filled 2021-01-25: qty 3

## 2021-01-25 MED ORDER — SODIUM CHLORIDE 0.9 % IV SOLN
2.0000 g | INTRAVENOUS | Status: DC
  Administered 2021-01-25 – 2021-01-26 (×2): 2 g via INTRAVENOUS
  Filled 2021-01-25 (×3): qty 20

## 2021-01-25 MED ORDER — MORPHINE SULFATE (PF) 2 MG/ML IV SOLN
INTRAVENOUS | Status: AC
  Administered 2021-01-25: 17:00:00 2 mg via INTRAVENOUS
  Filled 2021-01-25: qty 1

## 2021-01-25 MED ORDER — HEPARIN BOLUS VIA INFUSION
4000.0000 [IU] | Freq: Once | INTRAVENOUS | Status: AC
  Administered 2021-01-25: 23:00:00 4000 [IU] via INTRAVENOUS
  Filled 2021-01-25: qty 4000

## 2021-01-25 MED ORDER — HEPARIN (PORCINE) 25000 UT/250ML-% IV SOLN
900.0000 [IU]/h | INTRAVENOUS | Status: DC
Start: 2021-01-25 — End: 2021-01-26
  Administered 2021-01-25: 23:00:00 900 [IU]/h via INTRAVENOUS
  Filled 2021-01-25: qty 250

## 2021-01-25 NOTE — Progress Notes (Signed)
The patient complained of left sided CP 8/10 radiating into lower left breast . Nitroglycerin administered, now x 3. Per patient pain improves then returns. EKG Provider paged with update.

## 2021-01-25 NOTE — Progress Notes (Signed)
ANTICOAGULATION CONSULT NOTE  Pharmacy Consult for IV heparin Indication: chest pain/ACS  Allergies  Allergen Reactions   Levofloxacin Other (See Comments)    Made head feel weird   Nortriptyline Other (See Comments)    Caused rectal bleeding     Patient Measurements: Height: 5\' 5"  (165.1 cm) Weight: 77.1 kg (170 lb) IBW/kg (Calculated) : 57 Heparin Dosing Weight: 73 kg  Vital Signs: Temp: 98.6 F (37 C) (12/21 2049) Temp Source: Oral (12/21 2049) BP: 137/72 (12/21 2049) Pulse Rate: 80 (12/21 2049)  Labs: Recent Labs    01/23/21 0424 01/24/21 0450 01/25/21 0501 01/25/21 1700 01/25/21 1803 01/25/21 2001  HGB 11.6* 11.9*  --   --   --   --   HCT 37.0 37.5  --   --   --   --   PLT 103* 119*  --   --   --   --   CREATININE 0.82 0.91 0.98 0.74  --   --   CKTOTAL 564* 188 147  --   --   --   TROPONINIHS  --   --   --  108* 126* 99*    Estimated Creatinine Clearance: 61.4 mL/min (by C-G formula based on SCr of 0.74 mg/dL).   Medical History: Past Medical History:  Diagnosis Date   Allergy    COPD (chronic obstructive pulmonary disease) (HCC)    Diabetes mellitus    Gout    Hyperlipidemia    Hypertension    Low back pain    Peripheral neuropathy     Medications:  Medications Prior to Admission  Medication Sig Dispense Refill Last Dose   albuterol (VENTOLIN HFA) 108 (90 Base) MCG/ACT inhaler INHALE 2 PUFFS INTO THE LUNGS EVERY 4 HOURS AS NEEDED (Patient taking differently: 2 puffs every 4 (four) hours as needed for wheezing or shortness of breath.) 18 g 11 01/19/2021   alum & mag hydroxide-simeth (MAALOX/MYLANTA) 200-200-20 MG/5ML suspension Take 30 mLs by mouth 2 (two) times daily.   01/19/2021   Cyanocobalamin (B-12 PO) Take 1 tablet by mouth daily.   Past Week   diclofenac (FLECTOR) 1.3 % PTCH Place 1 patch onto the skin 2 (two) times daily. (Patient taking differently: Place 2 patches onto the skin 2 (two) times daily.) 60 patch 0 01/19/2021   diclofenac  sodium (VOLTAREN) 1 % GEL APPLY 2 GRAMS TOPICALLY 4 TIMES DAILY 100 g PRN Past Week   diphenoxylate-atropine (LOMOTIL) 2.5-0.025 MG tablet Take 2 tablets by mouth 2 (two) times daily. Take with imodium   01/19/2021   empagliflozin (JARDIANCE) 10 MG TABS tablet Take 1 tablet (10 mg total) by mouth daily before breakfast. 30 tablet 0 01/19/2021   empagliflozin (JARDIANCE) 25 MG TABS tablet Take 25 mg by mouth daily after lunch.   01/19/2021   esomeprazole (NEXIUM) 40 MG capsule TAKE 1 CAPSULE BY MOUTH ONCE DAILY (Patient taking differently: 40 mg at bedtime as needed (acid reflux/chest pain).) 30 capsule 0 01/18/2021   FEXOFENADINE HCL PO Take 0.5 tablets by mouth 2 (two) times daily.   01/19/2021   fluticasone (FLONASE) 50 MCG/ACT nasal spray Place 2 sprays into both nostrils daily. (Patient taking differently: Place 2 sprays into both nostrils daily as needed for allergies or rhinitis.) 16 g 11 Past Month   glipiZIDE (GLUCOTROL) 10 MG tablet Take 1 tablet (10 mg total) by mouth 2 (two) times daily before a meal. 60 tablet 3 01/19/2021   hydrALAZINE (APRESOLINE) 25 MG tablet Take 25 mg by  mouth 3 (three) times daily as needed (when SBP and DBP are too far apart).   couple weeks ago   hydrOXYzine (ATARAX/VISTARIL) 50 MG tablet Take 1 tablet (50 mg total) by mouth 3 (three) times daily as needed. (Patient taking differently: Take 50 mg by mouth daily as needed for itching.) 270 tablet 1 few months ago   levothyroxine (SYNTHROID) 75 MCG tablet Take 1 tablet (75 mcg total) by mouth daily. (Patient taking differently: Take 75 mcg by mouth daily before breakfast.) 90 tablet 1 01/19/2021   lipase/protease/amylase (CREON) 36000 UNITS CPEP capsule Take 36,000-72,000 Units by mouth See admin instructions. Take 2 capsules (72000 units) by mouth three times daily with meals, take 1 capsule (36000 units) with snacks or drinks   01/19/2021   loperamide (IMODIUM) 2 MG capsule Take 8 mg by mouth 2 (two) times daily. Take  with lomotil   01/19/2021   lovastatin (MEVACOR) 40 MG tablet Take 1 tablet (40 mg total) by mouth at bedtime. 90 tablet 3 01/19/2021   metoprolol tartrate (LOPRESSOR) 50 MG tablet TAKE 1 TABLET BY MOUTH TWICE A DAY AS NEEDED FOR INCREASED HEART RATE (Patient taking differently: Take 12.5 mg by mouth 2 (two) times daily as needed (heart racing). 1/4 tablet - 12.5 mg) 60 tablet 5 01/19/2021 at afternoon   NIFEdipine (PROCARDIA XL/NIFEDICAL XL) 60 MG 24 hr tablet Take 60 mg by mouth daily.   01/19/2021   nitroGLYCERIN (NITROSTAT) 0.4 MG SL tablet Take SL prn chest pain/May repeat 5 min apart up to 3 times prn (Patient taking differently: 0.4 mg every 5 (five) minutes as needed for chest pain (max 3 times).) 25 tablet 5 01/19/2021   nystatin cream (MYCOSTATIN) APPLY TO AFFECTED AREAS ONCE DAILY AS NEEDED (Patient taking differently: 1 application daily as needed (itching/irritation).) 30 g PRN Past Month   oxyCODONE (OXYCONTIN) 20 mg 12 hr tablet Take 1 tablet (20 mg total) by mouth every 12 (twelve) hours. (Patient taking differently: Take 10 mg by mouth daily as needed (severe pain).) 60 tablet 0 1-2 weeks ago   PATADAY 0.2 % SOLN PLACE 1 DROP INTO BOTH EYES EVERY DAY (Patient taking differently: Place 1 drop into both eyes daily as needed (itching).) 2.5 mL 2 Past Week   pregabalin (LYRICA) 25 MG capsule Take 1 capsule (25 mg total) by mouth 2 (two) times daily. 60 capsule 0 01/19/2021   spironolactone (ALDACTONE) 50 MG tablet Take 50 mg by mouth daily as needed (leg swelling).   Past Week   tiotropium (SPIRIVA HANDIHALER) 18 MCG inhalation capsule INHALE THE CONTENTS OF 1 CAPSUOLE VIA HANDIHALER ONCE DAILY (Patient taking differently: 18 mcg every morning.) 30 capsule 11 01/19/2021   tiZANidine (ZANAFLEX) 4 MG tablet Take 4 mg by mouth See admin instructions. Take one tablet (4 mg) by mouth twice daily - at bedtime and 1am   01/19/2021   topiramate (TOPAMAX) 100 MG tablet Take 3 tablets (300 mg total)  by mouth daily. (Patient taking differently: Take 100 mg by mouth every morning.) 90 tablet 5 01/19/2021   zolpidem (AMBIEN) 5 MG tablet Take 1 tablet (5 mg total) by mouth at bedtime as needed. for sleep (Patient taking differently: Take 5 mg by mouth at bedtime. for sleep) 30 tablet 0 01/19/2021   ACCU-CHEK FASTCLIX LANCETS MISC 1 Device by Does not apply route 3 (three) times daily. Use to check blood sugar up to three times a day 102 each 12    Scheduled:   alum & mag  hydroxide-simeth  30 mL Oral BID   diclofenac  2 patch Transdermal BID   diphenoxylate-atropine  2 tablet Oral BID   heparin  4,000 Units Intravenous Once   insulin aspart  0-15 Units Subcutaneous TID WC   insulin detemir  5 Units Subcutaneous Q24H   levothyroxine  75 mcg Oral QAC breakfast   lipase/protease/amylase  72,000 Units Oral TID with meals   mouth rinse  15 mL Mouth Rinse BID   metoprolol tartrate  12.5 mg Oral BID   NIFEdipine  30 mg Oral Daily   pantoprazole  40 mg Oral Daily   potassium chloride  40 mEq Oral Daily   pregabalin  25 mg Oral Once   pregabalin  25 mg Oral BID   tiZANidine  4 mg Oral QHS   topiramate  100 mg Oral q morning   umeclidinium bromide  1 puff Inhalation Daily   Infusions:   sodium chloride Stopped (01/24/21 2119)   cefTRIAXone (ROCEPHIN)  IV Stopped (01/25/21 1547)   heparin     Assessment: 50 yoF with PMH HLD, HTN, recurrent chest pain, admitted for acute emphysematous pyelonephritis. Again with chest pain and elevated troponins. Cardiology consulted, and Pharmacy to dose IV heparin for ACS.  Baseline INR, aPTT: not done Prior anticoagulation: none PTA, on LMWH 40 mg SQ daily; last dose 12/21 @ 1400  Significant events:  Today, 01/25/2021: CBC: Hgb low but stable; Plt low but improving No bleeding or infusion issues per nursing SCr stable WNL  Goal of Therapy: Heparin level 0.3-0.7 units/ml Monitor platelets by anticoagulation protocol: Yes  Plan: Heparin 4000 units  IV bolus x 1 Heparin 900 units/hr IV infusion Check heparin level 8 hrs after start Daily CBC, daily heparin level once stable Monitor for signs of bleeding or thrombosis  Reuel Boom, PharmD, BCPS [phone] 01/25/2021, 9:37 PM

## 2021-01-25 NOTE — Progress Notes (Signed)
Occupational Therapy Treatment Patient Details Name: MILTA CROSON MRN: [id number] DOB: January 19, 1946 Today's Date: 01/25/2021   History of present illness Mariah Reed is a 75 y.o. female with medical history significant of seasonal allergies, COPD, type II DM, gout, hyperlipidemia, hypertension, chronic lower back pain, peripheral neuropathy who is coming from [facility] ED to the emergency department due to severe sepsis in the setting of acute emphysematous pyelonephritis.   OT comments  Chart reviewed to date, pt greeted in bed, agreeable to OT following education and cueing in regards to the importance of progressing mobility for safe discharge. Tx session targeted improving safe, independent completion of toileting, bathing, and dressing tasks. Pt required CGA-MIN A for bed mobility, MIN A via hand held assist for transfer to [facility], toileting and clothing  management with MIN A. Grooming tasks completed at edge of bed with SET UP besides applying deodorant, which required MAX A due baseline impaired LUE function, weakness. Pt is making progress towards goals set fourth. Significant education provided re: home safety with ADL completion. Pt will benefit from ongoing education. Pt is left as received, NAD, all needs met. OT will continue to follow.    Recommendations for follow up therapy are one component of a multi-disciplinary discharge planning process, led by the attending physician.  Recommendations may be updated based on patient status, additional functional criteria and insurance authorization.    Follow Up Recommendations  Home health OT    Assistance Recommended at Discharge Intermittent Supervision/Assistance  Equipment Recommendations  None recommended by OT    Recommendations for Other Services      Precautions / Restrictions Precautions Precautions: Fall Restrictions Weight Bearing Restrictions: No       Mobility Bed Mobility Overal bed mobility: Needs  Assistance Bed Mobility: Supine to Sit;Sit to Supine     Supine to sit: Min guard Sit to supine: Min assist        Transfers Overall transfer level: Needs assistance Equipment used: 1 person hand held assist Transfers: Sit to/from Stand;Bed to chair/wheelchair/BSC Sit to Stand: Min guard Stand pivot transfers: Min assist               Balance Overall balance assessment: Needs assistance Sitting-balance support: No upper extremity supported Sitting balance-Leahy Scale: Good     Standing balance support: During functional activity;No upper extremity supported Standing balance-Leahy Scale: Fair                             ADL either performed or assessed with clinical judgement   ADL Overall ADL's : Needs assistance/impaired Eating/Feeding: Set up;Sitting Eating/Feeding Details (indicate cue type and reason): set up for package/container management Grooming: Wash/dry hands;Wash/dry face;Sitting;Set up   Upper Body Bathing: Minimal assistance;Sitting Upper Body Bathing Details (indicate cue type and reason): at edge of bed Lower Body Bathing: Minimal assistance;Sit to/from stand   Upper Body Dressing : Min guard;Sitting       Toilet Transfer: Minimal assistance;Cueing for safety;Stand-pivot Toilet Transfer Details (indicate cue type and reason): hand held assist Toileting- Clothing Manipulation and Hygiene: Minimal assistance;Sit to/from stand       Functional mobility during ADLs: Minimal assistance;Cueing for safety      Extremity/Trunk Assessment Upper Extremity Assessment Upper Extremity Assessment: Generalized weakness   Lower Extremity Assessment Lower Extremity Assessment: Generalized weakness        Vision Baseline Vision/History: 1 Wears glasses Patient Visual Report: No change from baseline  Perception     Praxis      Cognition Arousal/Alertness: Awake/alert Behavior During Therapy: WFL for tasks  assessed/performed Overall Cognitive Status: Within Functional Limits for tasks assessed                                 General Comments: alert and oriented x4          Exercises Other Exercises Other Exercises: edu re: importance of progressing mobility, safety while completing ADL, use of AE, compensatory techniques for ADL completion; home safety   Shoulder Instructions       General Comments vss throughout; redness/irritation noted on lower left posterior trunk- RN notified via secure chat    Pertinent Vitals/ Pain       Pain Assessment: Faces Faces Pain Scale: Hurts a little bit Pain Location: posterior trunk- redness/irritation noted; Pain Descriptors / Indicators: Discomfort Pain Intervention(s): Other (comment);Monitored during session;Repositioned (RN notified)  Home Living                                          Prior Functioning/Environment              Frequency  Min 2X/week        Progress Toward Goals  OT Goals(current goals can now be found in the care plan section)  Progress towards OT goals: Progressing toward goals  Acute Rehab OT Goals Patient Stated Goal: go home OT Goal Formulation: With patient Time For Goal Achievement: 02/07/21  Plan Discharge plan remains appropriate    Co-evaluation                 AM-PAC OT "6 Clicks" Daily Activity     Outcome Measure   Help from another person eating meals?: A Little Help from another person taking care of personal grooming?: A Little Help from another person toileting, which includes using toliet, bedpan, or urinal?: A Little Help from another person bathing (including washing, rinsing, drying)?: A Little Help from another person to put on and taking off regular upper body clothing?: A Little Help from another person to put on and taking off regular lower body clothing?: A Lot 6 Click Score: 17    End of Session Equipment Utilized During  Treatment: Gait belt  OT Visit Diagnosis: Muscle weakness (generalized) (M62.81);Other abnormalities of gait and mobility (R26.89)   Activity Tolerance Patient tolerated treatment well   Patient Left in bed;with call bell/phone within reach   Nurse Communication Mobility status;Other (comment) (skin integrity, pt feeling nauseous)        Time: 1100-1134 OT Time Calculation (min): 34 min  Charges: OT Treatments $Self Care/Home Management : 23-37 mins Shanon Payor, OTD OTR/L  01/25/21, 1:19 PM

## 2021-01-25 NOTE — Progress Notes (Signed)
PROGRESS NOTE    Mariah Reed  TJQ:[id number] DOB: Jan 31, 1946 DOA: 01/20/2021 PCP: Gladstone Lighter, MD   Brief Narrative:  75 y.o. WF PMHx seasonal allergies, COPD, type II,  DM uncontrolled with complication, DM peripheral neuropathy, Gout, hyperlipidemia, hypertension, chronic lower back pain,    Coming from [facility] ED to the emergency department due to severe sepsis in the setting of acute emphysematous pyelonephritis     Subjective: Very stoic female who states has chest pain~2 times per month alleviated with nitroglycerin, and metoprolol.  States saw a cardiologist a long time ago but never returned.  S/p minor MI never returned to see cardiologist.  Currently positive chest pain which started approximately 18 minutes ago located left lateral aspect of chest, negative radiation, negative diaphoresis, negative nausea, radiate 8/10.   Assessment & Plan:  Covid vaccination;  Principal Problem:   Acute pyonephrosis Active Problems:   Diabetes mellitus type 2 with complications ([place])   HLD (hyperlipidemia)   GOUT   Essential hypertension   COPD (chronic obstructive pulmonary disease) (HCC)   Gastroesophageal reflux disease without esophagitis   Hypothyroidism   Severe sepsis (HCC)   Hydronephrosis of right kidney   Hydroureter on right   Rhabdomyolysis  Severe sepsis POA / Acute pyonephrosis with Hydronephrosis of right kidney and Hydroureter on right Status post retrogrades cystoscopy, Staghorn stone removal and stent placement. Continued on Zosyn every 8 hours. Blood cultures negative Urine cultures pos for ecoli resistant to ampicillin, proteus resistant to nitrofurantoin Urology consult and intervention appreciated. Will require PCNL to treat large 4cm staghorn stone, can be arranged as outpatient -Urology recommends culture specific abx x 2 weeks   Rhabdomyolysis -Resolved    DM type II uncontrolled with complication/DM Neuropathy -12/17  hemoglobin A1c= 10.5   -12/21 Levemir 5 units daily -Moderate SSI  HLD (hyperlipidemia) Hold lovastatin as per above   GOUT Continue allopurinol. -12/21 uric acid pending   Essential hypertension -Procardia XL, 30mg  daily. -Metoprolol 12.5 mg BID Given soft BP, held spironolactone  Tachycardia New finding on 12/29. Noted to have HR into the 140's Resolved after starting metoprolol  Chest pain - 12/21 EKG: NSR with LBBB -12/21 consulted to cardiology will evaluate patient.  Will await recommendations -See essential HTN -12/21 Heparin drip   COPD (chronic obstructive pulmonary disease) (HCC) Nebs as needed This AM, requiring as high as 8L high flow O2 Ordered and reviewed CTA chest. Neg for PE, however evidence of  B effusions with findings suggestive of fluid, see below  Acute respiratory failure with hypoxia -New finding this 12/19 -O2 requirement up to [facility] now weaning -Given CT chest findings of effusion with concerns of fluid, pt has received IV lasix with improvovement and O2 that is weaning -12/21 improving, given patient's chest pain continue O2.  Titrate to maintain SPO2> 92%      Gastroesophageal reflux disease without esophagitis Continue PPI.     Hypothyroidism Continue levothyroxine 75 mcg p.o. daily as tolerated -12/21 TSH pending        DVT prophylaxis: Heparin drip Code Status: Full Family Communication:  Status is: Inpatient    Dispo: The patient is from: Home              Anticipated d/c is to: Home              Anticipated d/c date is: 3 days              Patient currently is not medically stable to  d/c.      Consultants:  Cardiology pending  Procedures/Significant Events:  12/20 Echocardiogram Left Ventricle: Left ventricular ejection fraction, by estimation, is 50  to 55%. The left ventricle has low normal function. The left ventricle has  no regional wall motion abnormalities. The left ventricular internal  cavity size was  normal in size.  There is no left ventricular hypertrophy. Abnormal (paradoxical) septal  motion, consistent with left bundle branch block. Left ventricular  diastolic parameters were normal.   Right Ventricle: The right ventricular size is normal. Right vetricular  wall thickness was not well visualized. Right ventricular systolic  function is normal.   Left Atrium: Left atrial size was mildly dilated.   Right Atrium: Right atrial size was normal in size.   Pericardium: There is no evidence of pericardial effusion. Presence of  epicardial fat layer.   Mitral Valve: The mitral valve is grossly normal. Trivial mitral valve  regurgitation. No evidence of mitral valve stenosis.   Tricuspid Valve: The tricuspid valve is grossly normal. Tricuspid valve  regurgitation is trivial. No evidence of tricuspid stenosis.   Aortic Valve: The aortic valve is grossly normal. Aortic valve  regurgitation is not visualized. No aortic stenosis is present. Aortic  valve mean gradient measures 5.0 mmHg. Aortic valve peak gradient measures  9.9 mmHg. Aortic valve area, by VTI measures  1.47 cm.     I have personally reviewed and interpreted all radiology studies and my findings are as above.  VENTILATOR SETTINGS:    Cultures   Antimicrobials: Anti-infectives (From admission, onward)    Start     Ordered Stop   01/25/21 1200  cefTRIAXone (ROCEPHIN) 2 g in sodium chloride 0.9 % 100 mL IVPB        01/25/21 1114     01/20/21 1800  piperacillin-tazobactam (ZOSYN) IVPB 3.375 g  Status:  Discontinued        01/20/21 1605 01/25/21 1114   01/20/21 1045  piperacillin-tazobactam (ZOSYN) IVPB 3.375 g        01/20/21 1038 01/20/21 1558   01/20/21 0800  piperacillin-tazobactam (ZOSYN) IVPB 3.375 g        01/20/21 0759 01/20/21 0940         Devices    LINES / TUBES:      Continuous Infusions:  sodium chloride Stopped (01/24/21 2119)   piperacillin-tazobactam 3.375 g (01/25/21 0512)      Objective: Vitals:   01/25/21 0400 01/25/21 0500 01/25/21 0515 01/25/21 0600  BP:   125/76   Pulse:   77   Resp: (!) 21 20 (!) 25 (!) 21  Temp:   98.6 F (37 C)   TempSrc:   Oral   SpO2:   98%   Weight:      Height:        Intake/Output Summary (Last 24 hours) at 01/25/2021 7903 Last data filed at 01/25/2021 8333 Gross per 24 hour  Intake 759.83 ml  Output 3600 ml  Net -2840.17 ml   Filed Weights   01/20/21 0600 01/20/21 1021 01/20/21 1151  Weight: 78.9 kg 79.4 kg 77.1 kg    Examination:  General: A/O x4, positive acute respiratory distress Eyes: negative scleral hemorrhage, negative anisocoria, negative icterus ENT: Negative Runny nose, negative gingival bleeding, Neck:  Negative scars, masses, torticollis, lymphadenopathy, JVD Lungs: Clear to auscultation bilaterally without wheezes or crackles Cardiovascular: Regular rate and rhythm without murmur gallop or rub normal S1 and S2, positive chest pain left lateral without radiation, negative diaphoresis Abdomen:  negative abdominal pain, nondistended, positive soft, bowel sounds, no rebound, no ascites, no appreciable mass Extremities: No significant cyanosis, clubbing, or edema bilateral lower extremities Skin: Negative rashes, lesions, ulcers Psychiatric:  Negative depression, negative anxiety, negative fatigue, negative mania  Central nervous system:  Cranial nerves II through XII intact, tongue/uvula midline, all extremities muscle strength 5/5, sensation intact throughout, negative dysarthria, negative expressive aphasia, negative receptive aphasia.  .     Data Reviewed: Care during the described time interval was provided by me .  I have reviewed this patient's available data, including medical history, events of note, physical examination, and all test results as part of my evaluation.   CBC: Recent Labs  Lab 01/20/21 0830 01/21/21 0149 01/21/21 0359 01/21/21 0816 01/22/21 0326 01/23/21 0424  01/24/21 0450  WBC 17.9*   < > 11.1* 10.1 10.2 7.8 8.7  NEUTROABS 16.6*  --   --   --   --   --   --   HGB 14.6   < > 11.2* 11.6* 11.4* 11.6* 11.9*  HCT 45.5   < > 35.8* 37.1 37.2 37.0 37.5  MCV 89.4   < > 90.4 89.8 91.0 89.4 88.4  PLT 130*   < > 74* 76* 86* 103* 119*   < > = values in this interval not displayed.   Basic Metabolic Panel: Recent Labs  Lab 01/21/21 0149 01/22/21 0326 01/23/21 0424 01/24/21 0450 01/25/21 0501  NA 134* 135 138 138 137  K 4.0 4.7 4.9 4.0 3.4*  CL 109 111 113* 109 105  CO2 19* 19* 20* 22 25  GLUCOSE 160* 186* 223* 166* 193*  BUN 28* 24* 16 17 20   CREATININE 1.41* 1.00 0.82 0.91 0.98  CALCIUM 7.0* 7.4* 8.0* 8.0* 8.2*  MG  --   --  2.5*  --   --    GFR: Estimated Creatinine Clearance: 50.1 mL/min (by C-G formula based on SCr of 0.98 mg/dL). Liver Function Tests: Recent Labs  Lab 01/21/21 0149 01/22/21 0326 01/23/21 0424 01/24/21 0450 01/25/21 0501  AST 88* 64* 44* 27 22  ALT 38 36 30 29 27   ALKPHOS 85 145* 194* 208* 180*  BILITOT 0.6 0.6 0.9 0.8 0.6  PROT 4.9* 5.4* 5.4* 5.5* 5.9*  ALBUMIN 2.6* 2.8* 2.7* 2.6* 2.8*   Recent Labs  Lab 01/20/21 0830  LIPASE 18   No results for input(s): AMMONIA in the last 168 hours. Coagulation Profile: Recent Labs  Lab 01/20/21 0830  INR 1.2   Cardiac Enzymes: Recent Labs  Lab 01/21/21 0149 01/22/21 0326 01/23/21 0424 01/24/21 0450 01/25/21 0501  CKTOTAL 3,434* 1,445* 564* 188 147   BNP (last 3 results) No results for input(s): PROBNP in the last 8760 hours. HbA1C: No results for input(s): HGBA1C in the last 72 hours. CBG: Recent Labs  Lab 01/24/21 0725 01/24/21 1101 01/24/21 1648 01/24/21 2123 01/25/21 0728  GLUCAP 169* 175* 200* 197* 158*   Lipid Profile: No results for input(s): CHOL, HDL, LDLCALC, TRIG, CHOLHDL, LDLDIRECT in the last 72 hours. Thyroid Function Tests: No results for input(s): TSH, T4TOTAL, FREET4, T3FREE, THYROIDAB in the last 72 hours. Anemia Panel: No  results for input(s): VITAMINB12, FOLATE, FERRITIN, TIBC, IRON, RETICCTPCT in the last 72 hours. Urine analysis:    Component Value Date/Time   COLORURINE YELLOW 01/20/2021 1421   APPEARANCEUR HAZY (A) 01/20/2021 1421   LABSPEC 1.005 01/20/2021 1421   PHURINE 6.0 01/20/2021 1421   GLUCOSEU >=500 (A) 01/20/2021 1421   GLUCOSEU NEGATIVE 03/17/2019  [place] (A) 01/20/2021 1421   HGBUR negative 12/15/2009 1001   BILIRUBINUR NEGATIVE 01/20/2021 1421   BILIRUBINUR Negative 07/17/2010 1136   [place] 01/20/2021 1421   PROTEINUR NEGATIVE 01/20/2021 1421   UROBILINOGEN 0.2 03/17/2019 1135   NITRITE NEGATIVE 01/20/2021 1421   LEUKOCYTESUR MODERATE (A) 01/20/2021 1421   Sepsis Labs: @LABRCNTIP (procalcitonin:4,lacticidven:4)  ) Recent Results (from the past 240 hour(s))  Blood Culture (routine x 2)     Status: None (Preliminary result)   Collection Time: 01/20/21  8:10 AM   Specimen: BLOOD  Result Value Ref Range Status   Specimen Description BLOOD BLOOD LEFT HAND  Final   Special Requests   Final    BOTTLES DRAWN AEROBIC AND ANAEROBIC Blood Culture results Mariah not be optimal due to an excessive volume of blood received in culture bottles   Culture   Final    NO GROWTH 4 DAYS Performed at [facility], [place] [address], [place], [place] [address]    Report Status PENDING  Incomplete  Resp Panel by RT-PCR (Flu A&B, Covid) Nasopharyngeal Swab     Status: None   Collection Time: 01/20/21  8:14 AM   Specimen: Nasopharyngeal Swab; Nasopharyngeal(NP) swabs in vial transport medium  Result Value Ref Range Status   SARS Coronavirus 2 by RT PCR NEGATIVE NEGATIVE Final    Comment: (NOTE) SARS-CoV-2 target nucleic acids are NOT DETECTED.  The SARS-CoV-2 RNA is generally detectable in upper respiratory specimens during the acute phase of infection. The lowest concentration of SARS-CoV-2 viral copies this assay can detect is 138 copies/mL. A negative result does not  preclude SARS-Cov-2 infection and should not be used as the sole basis for treatment or other patient management decisions. A negative result Mariah occur with  improper specimen collection/handling, submission of specimen other than nasopharyngeal swab, presence of viral mutation(s) within the areas targeted by this assay, and inadequate number of viral copies(<138 copies/mL). A negative result must be combined with clinical observations, patient history, and epidemiological information. The expected result is Negative.  Fact Sheet for Patients:  EntrepreneurPulse.com.au  Fact Sheet for Healthcare Providers:  IncredibleEmployment.be  This test is no t yet approved or cleared by the [place] FDA and  has been authorized for detection and/or diagnosis of SARS-CoV-2 by FDA under an Emergency Use Authorization (EUA). This EUA will remain  in effect (meaning this test can be used) for the duration of the COVID-19 declaration under Section 564(b)(1) of the Act, 21 U.S.C.section 360bbb-3(b)(1), unless the authorization is terminated  or revoked sooner.       Influenza A by PCR NEGATIVE NEGATIVE Final   Influenza B by PCR NEGATIVE NEGATIVE Final    Comment: (NOTE) The Xpert Xpress SARS-CoV-2/FLU/RSV plus assay is intended as an aid in the diagnosis of influenza from Nasopharyngeal swab specimens and should not be used as a sole basis for treatment. Nasal washings and aspirates are unacceptable for Xpert Xpress SARS-CoV-2/FLU/RSV testing.  Fact Sheet for Patients: EntrepreneurPulse.com.au  Fact Sheet for Healthcare Providers: IncredibleEmployment.be  This test is not yet approved or cleared by the [place] FDA and has been authorized for detection and/or diagnosis of SARS-CoV-2 by FDA under an Emergency Use Authorization (EUA). This EUA will remain in effect (meaning this test can be used) for the  duration of the COVID-19 declaration under Section 564(b)(1) of the Act, 21 U.S.C. section 360bbb-3(b)(1), unless the authorization is terminated or revoked.  Performed at [facility], [address] [place], [place] [address]   Blood Culture (routine x 2)     Status: None (Preliminary result)   Collection Time: 01/20/21 10:50 AM   Specimen: BLOOD  Result Value Ref Range Status   Specimen Description   Final    BLOOD RIGHT ANTECUBITAL Performed at [place] [address], [place], [place] [address]    Special Requests   Final    BOTTLES DRAWN AEROBIC AND ANAEROBIC Blood Culture adequate volume Performed at [place] [address], [place], [place] [address]    Culture   Final    NO GROWTH 4 DAYS Performed at [facility], [place] [address], [place], [place] [address]    Report Status PENDING  Incomplete  Urine Culture     Status: Abnormal   Collection Time: 01/20/21  2:21 PM   Specimen: In/Out Cath Urine  Result Value Ref Range Status   Specimen Description   Final    IN/OUT CATH URINE Performed at [place] [address], [place], [place] [address]    Special Requests   Final    NONE Performed at [facility], [place] [address], [place], [place] [address]    Culture (A)  Final    40,000 COLONIES/mL ESCHERICHIA COLI 20,000 COLONIES/mL PROTEUS MIRABILIS    Report Status 01/23/2021 FINAL  Final   Organism ID, Bacteria ESCHERICHIA COLI (A)  Final   Organism ID, Bacteria PROTEUS MIRABILIS (A)  Final      Susceptibility   Escherichia coli - MIC*    AMPICILLIN >=32 RESISTANT Resistant     CEFAZOLIN <=4 SENSITIVE Sensitive     CEFEPIME <=0.12 SENSITIVE Sensitive     CEFTRIAXONE <=0.25 SENSITIVE Sensitive     CIPROFLOXACIN <=0.25 SENSITIVE Sensitive     GENTAMICIN <=1 SENSITIVE Sensitive     IMIPENEM <=0.25 SENSITIVE Sensitive     NITROFURANTOIN <=16 SENSITIVE Sensitive      TRIMETH/SULFA <=20 SENSITIVE Sensitive     AMPICILLIN/SULBACTAM 16 INTERMEDIATE Intermediate     PIP/TAZO <=4 SENSITIVE Sensitive     * 40,000 COLONIES/mL ESCHERICHIA COLI   Proteus mirabilis - MIC*    AMPICILLIN <=2 SENSITIVE Sensitive     CEFAZOLIN 8 SENSITIVE Sensitive     CEFEPIME <=0.12 SENSITIVE Sensitive     CEFTRIAXONE <=0.25 SENSITIVE Sensitive     CIPROFLOXACIN <=0.25 SENSITIVE Sensitive     GENTAMICIN <=1 SENSITIVE Sensitive     IMIPENEM 4 SENSITIVE Sensitive     NITROFURANTOIN RESISTANT Resistant     TRIMETH/SULFA <=20 SENSITIVE Sensitive     AMPICILLIN/SULBACTAM <=2 SENSITIVE Sensitive     PIP/TAZO <=4 SENSITIVE Sensitive     * 20,000 COLONIES/mL PROTEUS MIRABILIS  MRSA Next Gen by PCR, Nasal     Status: None   Collection Time: 01/20/21  4:04 PM   Specimen: Nasal Mucosa; Nasal Swab  Result Value Ref Range Status   MRSA by PCR Next Gen NOT DETECTED NOT DETECTED Final    Comment: (NOTE) The GeneXpert MRSA Assay (FDA approved for NASAL specimens only), is one component of a comprehensive MRSA colonization surveillance program. It is not intended to diagnose MRSA infection nor to guide or monitor treatment for MRSA infections. Test performance is not FDA approved in patients less than 30 years old. Performed at [facility], [place] [address] [place],  [address]          Radiology Studies: CT ABDOMEN PELVIS WO CONTRAST  Result Date: 01/24/2021 CLINICAL DATA:  Nephrolithiasis. EXAM: CT ABDOMEN AND PELVIS WITHOUT CONTRAST TECHNIQUE: Multidetector CT imaging of the abdomen and pelvis was performed following the standard protocol without IV contrast. COMPARISON:  CT abdomen and pelvis 01/21/2021. FINDINGS: Lower chest: Small bilateral pleural effusions have increased. There is new right lower lobe airspace disease and new left lower lobe atelectasis. Hepatobiliary: Gallbladder surgically absent. There is stable dilatation of the common bile duct  measuring 12 mm. There is no intrahepatic biliary ductal dilatation. The liver is within normal limits. Pancreas: Unremarkable. No pancreatic ductal dilatation or surrounding inflammatory changes. Spleen: Normal in size without focal abnormality. Adrenals/Urinary Tract: Right ureteral stent is unchanged in position. There is no hydronephrosis. 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 Small right peripelvic cyst is unchanged. No ureteral calculi are seen. The left kidney and ureter are within normal limits. 2.9 cm mildly hyperdense cyst in the superior pole the left kidney is unchanged. Otherwise, the left kidney and ureter are within normal limits. There is a small amount of air in the bladder. There is also hyperdensity seen throughout the bladder. The adrenal glands are within normal limits. Stomach/Bowel: Stomach is within normal limits. Appendix appears normal. No evidence of bowel wall thickening, distention, or inflammatory changes. The appendix is not seen. There is sigmoid colon diverticulosis without evidence for acute diverticulitis. Vascular/Lymphatic: Aortic atherosclerosis. No enlarged abdominal or pelvic lymph nodes. Reproductive: Status post hysterectomy. No adnexal masses. Other: No abdominal wall hernia or abnormality. No abdominopelvic ascites. There is stable mild body wall edema. Musculoskeletal: No acute or significant osseous findings. IMPRESSION: 1. Right ureteral stent is unchanged in position. There is no hydronephrosis. 2. 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  No right ureteral calculi. 3. Small amount of air in the bladder which Mariah be related to recent instrumentation or infection. The bladder is mildly hyperdense likely related to recent contrast administration. 4. Increasing small bilateral pleural effusions with new right lower lobe airspace disease and new left lower lobe atelectasis. 5. Body wall edema. 6.  Aortic Atherosclerosis (ICD10-I70.0). Electronically  Signed   By: Ronney Asters M.D.   On: 01/24/2021 15:23   CT Angio Chest Pulmonary Embolism (PE) W or WO Contrast  Result Date: 01/23/2021 CLINICAL DATA:  Rule out pulmonary embolus. EXAM: CT ANGIOGRAPHY CHEST WITH CONTRAST TECHNIQUE: Multidetector CT imaging of the chest was performed using the standard protocol during bolus administration of intravenous contrast. Multiplanar CT image reconstructions and MIPs were obtained to evaluate the vascular anatomy. CONTRAST:  59mL OMNIPAQUE IOHEXOL 350 MG/ML SOLN COMPARISON:  None. FINDINGS: Cardiovascular: Exam detail is diminished secondary to respiratory motion artifact. Pulmonary arterial opacification is also suboptimal. Within these limitations there is no signs of central pulmonary artery or lobar pulmonary artery embolus. Beyond the lobar pulmonary arteries exam detail is essentially nondiagnostic for pulmonary embolus. Heart size is upper limits of normal. No pericardial effusion. Aortic atherosclerosis. Mild LAD coronary artery calcifications. Mediastinum/Nodes: No enlarged mediastinal, hilar, or axillary lymph nodes. Thyroid gland, trachea, and esophagus demonstrate no significant findings. Lungs/Pleura: Significantly degraded exam detail of the lungs due to motion artifact. Small left pleural effusion and moderate right pleural effusion identified. There is diffuse interlobular septal thickening identified suggesting interstitial edema. Airspace consolidation versus compressive type atelectasis is noted overlying the right pleural effusion, image 97/7. Subpleural consolidation is also noted within the posterior and lateral right upper lobe, image 43/7.Indeterminate subpleural nodule is identified within the anterior left upper lobe measuring 1.2 cm, image 70/7.  Upper Abdomen: Images through the upper abdomen are also degraded by motion artifact. Signs of previous cholecystectomy with increase caliber of the common bile duct is again noted and appears similar  to CT from 01/21/2021. Musculoskeletal: No chest wall abnormality. No acute or significant osseous findings. Review of the MIP images confirms the above findings. IMPRESSION: 1. Exam detail is diminished secondary to respiratory motion artifact and diminished pulmonary arterial opacification. Within these limitations there is no signs of central pulmonary artery or lobar pulmonary artery embolus. Beyond the lobar pulmonary arteries exam detail is essentially nondiagnostic for pulmonary embolus. 2. Bilateral pleural effusions, right greater than left. There is, bilateral interlobular septal thickening compatible with interstitial edema. Correlate for signs or symptoms of congestive heart failure. 3. Airspace consolidation versus compressive type atelectasis is noted overlying the right pleural effusion. Underlying infection not excluded. 4. Indeterminate subpleural nodule is identified within the anterior left upper lobe measuring 1.2 cm. Repeat CT of the chest following resolution of patient's current acute clinical condition is recommended for more definitive characterization. 5. Aortic Atherosclerosis (ICD10-I70.0). Electronically Signed   By: Kerby Moors M.D.   On: 01/23/2021 11:46   ECHOCARDIOGRAM COMPLETE  Result Date: 01/24/2021    ECHOCARDIOGRAM REPORT   Patient Name:   TIANNI ESCAMILLA Date of Exam: 01/24/2021 Medical Rec #:  [id number]       Height:       65.0 in Accession #:    [id number]      Weight:       170.0 lb Date of Birth:  April 05, 1945        BSA:          1.846 m Patient Age:    79 years        BP:           86/51 mmHg Patient Gender: F               HR:           76 bpm. Exam Location:  Inpatient Procedure: 2D Echo, Cardiac Doppler and Color Doppler Indications:    CHF  History:        Patient has no prior history of Echocardiogram examinations.                 Previous Myocardial Infarction, COPD, Arrythmias:LBBB; Risk                 Factors:Diabetes and Hypertension.  Sonographer:     Glo Herring Referring Phys: Hernandez  1. Left ventricular ejection fraction, by estimation, is 50 to 55%. The left ventricle has low normal function. The left ventricle has no regional wall motion abnormalities. Left ventricular diastolic parameters were normal.  2. Right ventricular systolic function is normal. The right ventricular size is normal.  3. Left atrial size was mildly dilated.  4. The mitral valve is grossly normal. Trivial mitral valve regurgitation. No evidence of mitral stenosis.  5. The aortic valve is grossly normal. Aortic valve regurgitation is not visualized. No aortic stenosis is present.  6. The inferior vena cava is dilated in size with >50% respiratory variability, suggesting right atrial pressure of 8 mmHg. Comparison(s): No prior Echocardiogram. Conclusion(s)/Recommendation(s): Otherwise normal echocardiogram, with minor abnormalities described in the report. FINDINGS  Left Ventricle: Left ventricular ejection fraction, by estimation, is 50 to 55%. The left ventricle has low normal function. The left ventricle has no regional wall motion abnormalities. The left ventricular internal cavity size was  normal in size. There is no left ventricular hypertrophy. Abnormal (paradoxical) septal motion, consistent with left bundle branch block. Left ventricular diastolic parameters were normal. Right Ventricle: The right ventricular size is normal. Right vetricular wall thickness was not well visualized. Right ventricular systolic function is normal. Left Atrium: Left atrial size was mildly dilated. Right Atrium: Right atrial size was normal in size. Pericardium: There is no evidence of pericardial effusion. Presence of epicardial fat layer. Mitral Valve: The mitral valve is grossly normal. Trivial mitral valve regurgitation. No evidence of mitral valve stenosis. Tricuspid Valve: The tricuspid valve is grossly normal. Tricuspid valve regurgitation is trivial. No evidence of  tricuspid stenosis. Aortic Valve: The aortic valve is grossly normal. Aortic valve regurgitation is not visualized. No aortic stenosis is present. Aortic valve mean gradient measures 5.0 mmHg. Aortic valve peak gradient measures 9.9 mmHg. Aortic valve area, by VTI measures 1.47 cm. Pulmonic Valve: The pulmonic valve was not well visualized. Pulmonic valve regurgitation is not visualized. Aorta: The aortic root, ascending aorta, aortic arch and descending aorta are all structurally normal, with no evidence of dilitation or obstruction. Venous: The inferior vena cava is dilated in size with greater than 50% respiratory variability, suggesting right atrial pressure of 8 mmHg. IAS/Shunts: The interatrial septum was not well visualized.  LEFT VENTRICLE PLAX 2D LVIDd:         4.50 cm      Diastology LVIDs:         2.80 cm      LV e' medial:    7.83 cm/s LV PW:         1.00 cm      LV E/e' medial:  12.4 LV IVS:        1.00 cm      LV e' lateral:   8.05 cm/s LVOT diam:     1.85 cm      LV E/e' lateral: 12.0 LV SV:         45 LV SV Index:   25 LVOT Area:     2.69 cm  LV Volumes (MOD) LV vol d, MOD A2C: 84.7 ml LV vol d, MOD A4C: 101.0 ml LV vol s, MOD A2C: 40.9 ml LV vol s, MOD A4C: 49.7 ml LV SV MOD A2C:     43.8 ml LV SV MOD A4C:     101.0 ml LV SV MOD BP:      49.7 ml RIGHT VENTRICLE             IVC RV Basal diam:  3.20 cm     IVC diam: 2.20 cm RV Mid diam:    2.20 cm RV S prime:     12.30 cm/s LEFT ATRIUM             Index        RIGHT ATRIUM           Index LA diam:        3.50 cm 1.90 cm/m   RA Area:     14.10 cm LA Vol (A2C):   59.0 ml 31.96 ml/m  RA Volume:   31.20 ml  16.90 ml/m LA Vol (A4C):   46.4 ml 25.13 ml/m LA Biplane Vol: 53.9 ml 29.20 ml/m  AORTIC VALVE                     PULMONIC VALVE AV Area (Vmax):    1.48 cm      PV Vmax:  0.95 m/s AV Area (Vmean):   1.45 cm      PV Peak grad:  3.6 mmHg AV Area (VTI):     1.47 cm AV Vmax:           157.00 cm/s AV Vmean:          109.000 cm/s AV VTI:             0.310 m AV Peak Grad:      9.9 mmHg AV Mean Grad:      5.0 mmHg LVOT Vmax:         86.20 cm/s LVOT Vmean:        58.800 cm/s LVOT VTI:          0.169 m LVOT/AV VTI ratio: 0.55  AORTA Ao Root diam: 2.90 cm MITRAL VALVE MV Area (PHT): 3.81 cm    SHUNTS MV Decel Time: 199 msec    Systemic VTI:  0.17 m MV E velocity: 96.80 cm/s  Systemic Diam: 1.85 cm MV A velocity: 96.40 cm/s MV E/A ratio:  1.00 Buford Dresser MD Electronically signed by Buford Dresser MD Signature Date/Time: 01/24/2021/3:15:12 PM    Final         Scheduled Meds:  alum & mag hydroxide-simeth  30 mL Oral BID   diclofenac  2 patch Transdermal BID   diphenoxylate-atropine  2 tablet Oral BID   enoxaparin (LOVENOX) injection  40 mg Subcutaneous Q24H   insulin aspart  0-15 Units Subcutaneous TID WC   levothyroxine  75 mcg Oral QAC breakfast   lipase/protease/amylase  72,000 Units Oral TID with meals   mouth rinse  15 mL Mouth Rinse BID   metoprolol tartrate  12.5 mg Oral BID   NIFEdipine  30 mg Oral Daily   pantoprazole  40 mg Oral Daily   potassium chloride  40 mEq Oral Daily   pregabalin  25 mg Oral Once   pregabalin  25 mg Oral BID   tiZANidine  4 mg Oral QHS   topiramate  100 mg Oral q morning   umeclidinium bromide  1 puff Inhalation Daily   Continuous Infusions:  sodium chloride Stopped (01/24/21 2119)   piperacillin-tazobactam 3.375 g (01/25/21 0512)     LOS: 5 days   The patient is critically ill with multiple organ systems failure and requires high complexity decision making for assessment and support, frequent evaluation and titration of therapies, application of advanced monitoring technologies and extensive interpretation of multiple databases. Critical Care Time devoted to patient care services described in this note  Time spent: 40 minutes     Jonah Nestle, Geraldo Docker, MD [facility]   If 7PM-7AM, please contact night-coverage 01/25/2021, 9:14 AM

## 2021-01-25 NOTE — Progress Notes (Signed)
CRITICAL VALUE  TROPONIN 108- Hospitalist provider updated with result. See referral.

## 2021-01-25 NOTE — Progress Notes (Addendum)
PT Cancellation Note  Patient Details Name: AYZIA DAY MRN: [id number] DOB: October 10, 1945   Cancelled Treatment:    Reason Eval/Treat Not Completed:  Attempted PT eval-pt declined participation. Will check back as schedule allows.  **Pt has refused to participate with PT for 2nd day. If pt continues to refuse, will sign off.**   Doreatha Massed, PT Acute Rehabilitation  Office: [phone] Pager: [phone]

## 2021-01-26 DIAGNOSIS — A419 Sepsis, unspecified organism: Secondary | ICD-10-CM | POA: Diagnosis not present

## 2021-01-26 DIAGNOSIS — N136 Pyonephrosis: Secondary | ICD-10-CM | POA: Diagnosis not present

## 2021-01-26 DIAGNOSIS — I248 Other forms of acute ischemic heart disease: Secondary | ICD-10-CM | POA: Diagnosis present

## 2021-01-26 DIAGNOSIS — R778 Other specified abnormalities of plasma proteins: Secondary | ICD-10-CM

## 2021-01-26 DIAGNOSIS — R7989 Other specified abnormal findings of blood chemistry: Secondary | ICD-10-CM | POA: Diagnosis present

## 2021-01-26 DIAGNOSIS — R072 Precordial pain: Secondary | ICD-10-CM

## 2021-01-26 DIAGNOSIS — J431 Panlobular emphysema: Secondary | ICD-10-CM | POA: Diagnosis not present

## 2021-01-26 DIAGNOSIS — E118 Type 2 diabetes mellitus with unspecified complications: Secondary | ICD-10-CM | POA: Diagnosis not present

## 2021-01-26 LAB — CBC WITH DIFFERENTIAL/PLATELET
Abs Immature Granulocytes: 0.15 10*3/uL — ABNORMAL HIGH (ref 0.00–0.07)
Basophils Absolute: 0.1 10*3/uL (ref 0.0–0.1)
Basophils Relative: 1 %
Eosinophils Absolute: 0.2 10*3/uL (ref 0.0–0.5)
Eosinophils Relative: 4 %
HCT: 42.1 % (ref 36.0–46.0)
Hemoglobin: 13.3 g/dL (ref 12.0–15.0)
Immature Granulocytes: 3 %
Lymphocytes Relative: 30 %
Lymphs Abs: 1.7 10*3/uL (ref 0.7–4.0)
MCH: 28.3 pg (ref 26.0–34.0)
MCHC: 31.6 g/dL (ref 30.0–36.0)
MCV: 89.6 fL (ref 80.0–100.0)
Monocytes Absolute: 0.5 10*3/uL (ref 0.1–1.0)
Monocytes Relative: 9 %
Neutro Abs: 3 10*3/uL (ref 1.7–7.7)
Neutrophils Relative %: 53 %
Platelets: 151 10*3/uL (ref 150–400)
RBC: 4.7 MIL/uL (ref 3.87–5.11)
RDW: 14.2 % (ref 11.5–15.5)
WBC: 5.6 10*3/uL (ref 4.0–10.5)
nRBC: 0 % (ref 0.0–0.2)

## 2021-01-26 LAB — COMPREHENSIVE METABOLIC PANEL
ALT: 24 U/L (ref 0–44)
AST: 18 U/L (ref 15–41)
Albumin: 3.1 g/dL — ABNORMAL LOW (ref 3.5–5.0)
Alkaline Phosphatase: 180 U/L — ABNORMAL HIGH (ref 38–126)
Anion gap: 5 (ref 5–15)
BUN: 12 mg/dL (ref 8–23)
CO2: 22 mmol/L (ref 22–32)
Calcium: 8.5 mg/dL — ABNORMAL LOW (ref 8.9–10.3)
Chloride: 110 mmol/L (ref 98–111)
Creatinine, Ser: 0.65 mg/dL (ref 0.44–1.00)
GFR, Estimated: >60 mL/min (ref >60)
Glucose, Bld: 156 mg/dL — ABNORMAL HIGH (ref 70–99)
Potassium: 3.7 mmol/L (ref 3.5–5.1)
Sodium: 137 mmol/L (ref 135–145)
Total Bilirubin: 0.6 mg/dL (ref 0.3–1.2)
Total Protein: 6.6 g/dL (ref 6.5–8.1)

## 2021-01-26 LAB — GLUCOSE, CAPILLARY
Glucose-Capillary: 147 mg/dL — ABNORMAL HIGH (ref 70–99)
Glucose-Capillary: 246 mg/dL — ABNORMAL HIGH (ref 70–99)
Glucose-Capillary: 164 mg/dL — ABNORMAL HIGH (ref 70–99)
Glucose-Capillary: 197 mg/dL — ABNORMAL HIGH (ref 70–99)

## 2021-01-26 LAB — LIPID PANEL
Cholesterol: 168 mg/dL (ref 0–200)
HDL: 22 mg/dL — ABNORMAL LOW (ref >40)
LDL Cholesterol: 109 mg/dL — ABNORMAL HIGH (ref 0–99)
Total CHOL/HDL Ratio: 7.6 RATIO
Triglycerides: 186 mg/dL — ABNORMAL HIGH (ref <150)
VLDL: 37 mg/dL (ref 0–40)

## 2021-01-26 LAB — URIC ACID: Uric Acid, Serum: 3 mg/dL (ref 2.5–7.1)

## 2021-01-26 LAB — HEPARIN LEVEL (UNFRACTIONATED): Heparin Unfractionated: <0.1 IU/mL — ABNORMAL LOW (ref 0.30–0.70)

## 2021-01-26 LAB — MAGNESIUM: Magnesium: 2.3 mg/dL (ref 1.7–2.4)

## 2021-01-26 LAB — TSH: TSH: 5.557 u[IU]/mL — ABNORMAL HIGH (ref 0.350–4.500)

## 2021-01-26 LAB — CK: Total CK: 78 U/L (ref 38–234)

## 2021-01-26 LAB — PHOSPHORUS: Phosphorus: 2.5 mg/dL (ref 2.5–4.6)

## 2021-01-26 MED ORDER — HEPARIN (PORCINE) 25000 UT/250ML-% IV SOLN
1150.0000 [IU]/h | INTRAVENOUS | Status: DC
  Filled 2021-01-26: qty 250

## 2021-01-26 MED ORDER — INSULIN ASPART 100 UNIT/ML IJ SOLN
5.0000 [IU] | Freq: Three times a day (TID) | INTRAMUSCULAR | Status: DC
Start: 2021-01-26 — End: 2021-01-26

## 2021-01-26 MED ORDER — HEPARIN BOLUS VIA INFUSION
2000.0000 [IU] | Freq: Once | INTRAVENOUS | Status: AC
  Administered 2021-01-26: 07:00:00 2000 [IU] via INTRAVENOUS
  Filled 2021-01-26: qty 2000

## 2021-01-26 MED ORDER — LEVOTHYROXINE SODIUM 100 MCG PO TABS
100.0000 ug | ORAL_TABLET | Freq: Every day | ORAL | Status: DC
  Administered 2021-01-27 – 2021-01-29 (×3): 100 ug via ORAL
  Filled 2021-01-26 (×3): qty 1

## 2021-01-26 MED ORDER — MORPHINE SULFATE (PF) 2 MG/ML IV SOLN
2.0000 mg | INTRAVENOUS | Status: DC | PRN
  Administered 2021-01-26: 08:00:00 2 mg via INTRAVENOUS
  Filled 2021-01-26 (×2): qty 1

## 2021-01-26 MED ORDER — HEPARIN SODIUM (PORCINE) 5000 UNIT/ML IJ SOLN
5000.0000 [IU] | Freq: Three times a day (TID) | INTRAMUSCULAR | Status: DC
  Administered 2021-01-26 – 2021-01-29 (×9): 5000 [IU] via SUBCUTANEOUS
  Filled 2021-01-26 (×9): qty 1

## 2021-01-26 MED ORDER — INSULIN DETEMIR 100 UNIT/ML ~~LOC~~ SOLN
10.0000 [IU] | SUBCUTANEOUS | Status: DC
  Administered 2021-01-26 – 2021-01-29 (×4): 10 [IU] via SUBCUTANEOUS
  Filled 2021-01-26 (×4): qty 0.1

## 2021-01-26 MED ORDER — LIP MEDEX EX OINT
TOPICAL_OINTMENT | CUTANEOUS | Status: DC | PRN
  Administered 2021-01-26: 75 via TOPICAL
  Filled 2021-01-26 (×2): qty 7

## 2021-01-26 MED ORDER — INSULIN ASPART 100 UNIT/ML IJ SOLN
3.0000 [IU] | Freq: Three times a day (TID) | INTRAMUSCULAR | Status: DC
Start: 2021-01-26 — End: 2021-01-27
  Administered 2021-01-26 – 2021-01-27 (×2): 3 [IU] via SUBCUTANEOUS

## 2021-01-26 NOTE — Progress Notes (Signed)
Pt has stress text schedule at [facility] 12/23. Pt to be at [facility] by 0830 transported will be provided by [facility]. Report given to on-coming nurse.

## 2021-01-26 NOTE — Evaluation (Signed)
Physical Therapy Evaluation Patient Details Name: Mariah Reed MRN: [id number] DOB: 09/04/1945 Today's Date: 01/26/2021  History of Present Illness  Mariah Reed is a 75 y.o. female with medical history significant of seasonal allergies, COPD, type II DM, gout, hyperlipidemia, hypertension, chronic lower back pain, peripheral neuropathy who is coming from [facility] ED to the emergency department due to severe sepsis in the setting of acute emphysematous pyelonephritis.  Clinical Impression  Pt admitted with above diagnosis. Min/guard assist for stand pivot transfer from bed to recliner. Pt declined ambulation, but reports she ambulates independently at home.  Pt currently with functional limitations due to the deficits listed below (see PT Problem List). Pt will benefit from skilled PT to increase their independence and safety with mobility to allow discharge to the venue listed below.          Recommendations for follow up therapy are one component of a multi-disciplinary discharge planning process, led by the attending physician.  Recommendations may be updated based on patient status, additional functional criteria and insurance authorization.  Follow Up Recommendations Home health PT    Assistance Recommended at Discharge Intermittent Supervision/Assistance  Functional Status Assessment Patient has had a recent decline in their functional status and demonstrates the ability to make significant improvements in function in a reasonable and predictable amount of time.  Equipment Recommendations  None recommended by PT    Recommendations for Other Services       Precautions / Restrictions Precautions Precautions: Fall Precaution Comments: pt denies falls in past 6 months Restrictions Weight Bearing Restrictions: No      Mobility  Bed Mobility Overal bed mobility: Modified Independent       Supine to sit: Modified independent (Device/Increase time);HOB elevated      General bed mobility comments: used rail    Transfers Overall transfer level: Needs assistance   Transfers: Sit to/from Stand;Bed to chair/wheelchair/BSC Sit to Stand: Min guard   Step pivot transfers: Min guard       General transfer comment: no physical assist, no loss of balance    Ambulation/Gait               General Gait Details: pt declined ambulation  Stairs            Wheelchair Mobility    Modified Rankin (Stroke Patients Only)       Balance Overall balance assessment: Needs assistance Sitting-balance support: No upper extremity supported Sitting balance-Leahy Scale: Good     Standing balance support: During functional activity;No upper extremity supported Standing balance-Leahy Scale: Fair                               Pertinent Vitals/Pain Pain Score: 10-Worst pain ever Pain Location: head ache and R hip and RLE Pain Descriptors / Indicators: Headache Pain Intervention(s): Limited activity within patient's tolerance;Monitored during session;Patient requesting pain meds-RN notified    Home Living Family/patient expects to be discharged to:: Private residence Living Arrangements: Alone Available Help at Discharge: Available PRN/intermittently Type of Home: Apartment Home Access: Level entry       Home Layout: One level Home Equipment: Grant - single point;Shower seat      Prior Function Prior Level of Function : Independent/Modified Independent;Driving           ADLs (physical): IADLs Mobility Comments: pt states that she uses a cane sometimes ADLs Comments: Pt was Ind with ADLs/selfcare. Had aides for  home mgt but states that she "ran them all off because they didn't want to work". Pt has assist with grocery shopping     Hand Dominance   Dominant Hand: Right    Extremity/Trunk Assessment   Upper Extremity Assessment Upper Extremity Assessment: Defer to OT evaluation LUE Deficits / Details: hx of L  forearm  deformity since childhood, L hand non functional    Lower Extremity Assessment Lower Extremity Assessment: Overall WFL for tasks assessed    Cervical / Trunk Assessment Cervical / Trunk Assessment: Normal  Communication   Communication: No difficulties  Cognition Arousal/Alertness: Awake/alert Behavior During Therapy: WFL for tasks assessed/performed Overall Cognitive Status: Within Functional Limits for tasks assessed                                 General Comments: alert and oriented x4        General Comments      Exercises     Assessment/Plan    PT Assessment Patient needs continued PT services  PT Problem List Decreased activity tolerance;Pain;Decreased mobility       PT Treatment Interventions Gait training;Therapeutic activities;Therapeutic exercise;Functional mobility training;Patient/family education    PT Goals (Current goals can be found in the Care Plan section)  Acute Rehab PT Goals Patient Stated Goal: return home PT Goal Formulation: With patient Time For Goal Achievement: 02/09/21 Potential to Achieve Goals: Good    Frequency Min 3X/week   Barriers to discharge        Co-evaluation               AM-PAC PT "6 Clicks" Mobility  Outcome Measure Help needed turning from your back to your side while in a flat bed without using bedrails?: None Help needed moving from lying on your back to sitting on the side of a flat bed without using bedrails?: None Help needed moving to and from a bed to a chair (including a wheelchair)?: A Little Help needed standing up from a chair using your arms (e.g., wheelchair or bedside chair)?: A Little Help needed to walk in hospital room?: A Little Help needed climbing 3-5 steps with a railing? : A Little 6 Click Score: 20    End of Session   Activity Tolerance: Patient tolerated treatment well;No increased pain Patient left: in chair;with call bell/phone within reach;with  nursing/sitter in room;with chair alarm set Nurse Communication: Mobility status;Patient requests pain meds PT Visit Diagnosis: Difficulty in walking, not elsewhere classified (R26.2)    Time: 4496-7591 PT Time Calculation (min) (ACUTE ONLY): 9 min   Charges:   PT Evaluation $PT Eval Moderate Complexity: 1 Mod          Philomena Doheny PT 01/26/2021  Acute Rehabilitation Services Pager [phone] Office [phone]

## 2021-01-26 NOTE — Progress Notes (Signed)
Pt complaining of 6/10 left flank/chest pain.  MD notified.  New orders placed.  Will continue to follow.

## 2021-01-26 NOTE — Consult Note (Addendum)
Cardiology Consultation:   Patient ID: Mariah Reed MRN: [id number]; DOB: 1945/04/21  Admit date: 01/20/2021 Date of Consult: 01/26/2021  PCP:  Mariah Lighter, MD   [facility] Providers Cardiologist:  Mariah Reed - previously Dr. Rockey Reed, now Dr. Stanford Reed She does not want to follow with Dr. Rockey Reed. She needs cards in [place]  Patient Profile:   Mariah Reed is a 75 y.o. female with a hx of HTN, HLD, DM, hypothyroidism, COPD, smoking history, and neuropathy who is being seen 01/26/2021 for the evaluation of chest pain at the request of Mariah Reed.  History of Present Illness:   Mariah Reed has no prior cardiac history. 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 HLD treated with mevacor. She was seen by Dr. Rockey Reed in 2011-2012 for palpitations. She has a known LBBB. She complained of DOE felt related to her COPD. She deferred stress testing at that time. CTA coronary was offered but not completed.   She presented to the ED 01/20/21 for weakness, pt slid down to the floor without fall and was unable to get up. Transported to ER via [facility]. On arrival, she reported RLQ abdominal pain associated with nausea and numerous episodes of vomiting prior to arrival. On presentation, she had severe lactic acidosis (8.1), leukocytosis (WBC 17.9), CK 1774, and AKI with sCr 1.7. CTA with aortic atherosclerosis, negative for PE, but showed obstructing calculus at right UVJ with possible calyx rupture, hydronephrosis of right kidney. 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 She was tachycardic on arrival in the setting of pain and infection. Yesterday 01/25/21 she reported chest tightness. EKG showed LBBB and  cardiology was consulted.  HS troponin 108 --> 126 --> 99 CK 1774 --> 3434 --> 1445 --> 564 --> 188 --> 147 --> 78  Given troponin rise, she was started on heparin gtt by primary.  CTA 01/23/21 described aortic atherosclerosis and mild LAD coronary artery calcifications. She was also tachycardic on 12/20 in the 140s treated with low dose metoprolol.   During my exam, she describes left lateral chest wall pain in a "square" that is not tender to palpation. She is very sedentary and is unable to close doors or bring groceries into the house. She denies increased chest pain when she is maximally exerting herself. She is unable to tell me when the pain started, it is sharp, without radiation or associated symptoms. She states she has a history of Afib and has PRN metoprolol which she cuts up and takes small amounts at a time when she has rapid heart rate in the 90s. She often notices rapid heart rate when she is trying to sleep. At times she wakes up and checks her pulse and finds that it is in the 90s. In review of Mariah Reed note, I do not see documentation of Afib. She is not on Mariah Reed.    Past Medical History:  Diagnosis Date   Allergy    COPD (chronic obstructive pulmonary disease) (HCC)    Diabetes mellitus    Gout    Hyperlipidemia    Hypertension    Low back pain    Peripheral neuropathy     Past Surgical History:  Procedure Laterality Date  ABDOMINAL HYSTERECTOMY     CHOLECYSTECTOMY  unknown   Patient stated she does not remember when it was removed. (possibly 1997)   [place], URETEROSCOPY AND STENT PLACEMENT Right 01/20/2021   Procedure: Mariah Reed, URETEROSCOPY AND STENT PLACEMENT;  Surgeon: Mariah Lima, MD;  Location: [facility];  Service: Urology;  Laterality: Right;   POLYPECTOMY     Colon   TUBAL LIGATION       Home Medications:  Prior to Admission medications   Medication Sig Start Date End Date Taking?  Authorizing Provider  albuterol (VENTOLIN HFA) 108 (90 Base) MCG/ACT inhaler INHALE 2 PUFFS INTO THE LUNGS EVERY 4 HOURS AS NEEDED Patient taking differently: 2 puffs every 4 (four) hours as needed for wheezing or shortness of breath. 05/22/19  Yes Mariah Maw, MD  alum & mag hydroxide-simeth (MAALOX/MYLANTA) 200-200-20 MG/5ML suspension Take 30 mLs by mouth 2 (two) times daily.   Yes [provider]  Cyanocobalamin (B-12 PO) Take 1 tablet by mouth daily.   Yes [provider]  diclofenac (FLECTOR) 1.3 % PTCH Place 1 patch onto the skin 2 (two) times daily. Patient taking differently: Place 2 patches onto the skin 2 (two) times daily. 11/26/19  Yes Mariah Reed, Mariah Bucy, MD  diclofenac sodium (VOLTAREN) 1 % GEL APPLY 2 GRAMS TOPICALLY 4 TIMES DAILY 04/21/18  Yes Mariah Passy, MD  diphenoxylate-atropine (LOMOTIL) 2.5-0.025 MG tablet Take 2 tablets by mouth 2 (two) times daily. Take with imodium   Yes [provider]  empagliflozin (JARDIANCE) 10 MG TABS tablet Take 1 tablet (10 mg total) by mouth daily before breakfast. 11/03/19  Yes Mariah Reed, Mariah Bucy, MD  empagliflozin (JARDIANCE) 25 MG TABS tablet Take 25 mg by mouth daily after lunch.   Yes [provider]  esomeprazole (NEXIUM) 40 MG capsule TAKE 1 CAPSULE BY MOUTH ONCE DAILY Patient taking differently: 40 mg at bedtime as needed (acid reflux/chest pain). 11/27/19  Yes Mariah Reed, Mariah Bucy, MD  FEXOFENADINE HCL PO Take 0.5 tablets by mouth 2 (two) times daily.   Yes [provider]  fluticasone (FLONASE) 50 MCG/ACT nasal spray Place 2 sprays into both nostrils daily. Patient taking differently: Place 2 sprays into both nostrils daily as needed for allergies or rhinitis. 07/21/19  Yes Mariah Maw, MD  glipiZIDE (GLUCOTROL) 10 MG tablet Take 1 tablet (10 mg total) by mouth 2 (two) times daily before a meal. 10/27/19  Yes Mariah Reed, Mariah Bucy, MD  hydrALAZINE (APRESOLINE) 25 MG tablet  Take 25 mg by mouth 3 (three) times daily as needed (when SBP and DBP are too far apart). 01/11/21  Yes [provider]  hydrOXYzine (ATARAX/VISTARIL) 50 MG tablet Take 1 tablet (50 mg total) by mouth 3 (three) times daily as needed. Patient taking differently: Take 50 mg by mouth daily as needed for itching. 04/20/19  Yes Mariah Maw, MD  levothyroxine (SYNTHROID) 75 MCG tablet Take 1 tablet (75 mcg total) by mouth daily. Patient taking differently: Take 75 mcg by mouth daily before breakfast. 10/27/19  Yes Mariah Reed, Mariah Bucy, MD  lipase/protease/amylase (CREON) 36000 UNITS CPEP capsule Take 25,956-38,756 Units by mouth See admin instructions. Take 2 capsules (72000 units) by mouth three times daily with meals, take 1 capsule (36000 units) with snacks or drinks   Yes [provider]  loperamide (IMODIUM) 2 MG capsule Take 8 mg by mouth 2 (two) times daily. Take with lomotil   Yes [provider]  lovastatin (MEVACOR)  40 MG tablet Take 1 tablet (40 mg total) by mouth at bedtime. 10/27/19  Yes Mariah Reed, Mariah Bucy, MD  metoprolol tartrate (LOPRESSOR) 50 MG tablet TAKE 1 TABLET BY MOUTH TWICE A DAY AS NEEDED FOR INCREASED HEART RATE Patient taking differently: Take 12.5 mg by mouth 2 (two) times daily as needed (heart racing). 1/4 tablet - 12.5 mg 07/21/19  Yes Mariah Maw, MD  NIFEdipine (PROCARDIA XL/NIFEDICAL XL) 60 MG 24 hr tablet Take 60 mg by mouth daily. 01/10/21  Yes [provider]  nitroGLYCERIN (NITROSTAT) 0.4 MG SL tablet Take SL prn chest pain/May repeat 5 min apart up to 3 times prn Patient taking differently: 0.4 mg every 5 (five) minutes as needed for chest pain (max 3 times). 07/21/19  Yes Mariah Maw, MD  nystatin cream (MYCOSTATIN) APPLY TO AFFECTED AREAS ONCE DAILY AS NEEDED Patient taking differently: 1 application daily as needed (itching/irritation). 07/30/19  Yes Mariah Maw, MD  oxyCODONE (OXYCONTIN) 20 mg  12 hr tablet Take 1 tablet (20 mg total) by mouth every 12 (twelve) hours. Patient taking differently: Take 10 mg by mouth daily as needed (severe pain). 07/16/19  Yes Mariah Maw, MD  PATADAY 0.2 % SOLN PLACE 1 DROP INTO BOTH EYES EVERY DAY Patient taking differently: Place 1 drop into both eyes daily as needed (itching). 02/04/14  Yes Mariah Passy, MD  pregabalin (LYRICA) 25 MG capsule Take 1 capsule (25 mg total) by mouth 2 (two) times daily. 11/26/19  Yes Mariah Reed, Mariah Bucy, MD  spironolactone (ALDACTONE) 50 MG tablet Take 50 mg by mouth daily as needed (leg swelling). 01/10/21  Yes [provider]  tiotropium (SPIRIVA HANDIHALER) 18 MCG inhalation capsule INHALE THE CONTENTS OF 1 CAPSUOLE VIA HANDIHALER ONCE DAILY Patient taking differently: 18 mcg every morning. 10/27/19  Yes Mariah Reed, Mariah Bucy, MD  tiZANidine (ZANAFLEX) 4 MG tablet Take 4 mg by mouth See admin instructions. Take one tablet (4 mg) by mouth twice daily - at bedtime and 1am 01/04/21  Yes [provider]  topiramate (TOPAMAX) 100 MG tablet Take 3 tablets (300 mg total) by mouth daily. Patient taking differently: Take 100 mg by mouth every morning. 10/27/19  Yes Mariah Reed, Mariah Bucy, MD  zolpidem (AMBIEN) 5 MG tablet Take 1 tablet (5 mg total) by mouth at bedtime as needed. for sleep Patient taking differently: Take 5 mg by mouth at bedtime. for sleep 11/26/19  Yes Mariah Reed, Mariah Bucy, MD  ACCU-CHEK FASTCLIX LANCETS MISC 1 Device by Does not apply route 3 (three) times daily. Use to check blood sugar up to three times a day 02/08/11   Mariah Passy, MD    Inpatient Medications: Scheduled Meds:  alum & mag hydroxide-simeth  30 mL Oral BID   diclofenac  2 patch Transdermal BID   diphenoxylate-atropine  2 tablet Oral BID   insulin aspart  0-15 Units Subcutaneous TID WC   insulin detemir  5 Units Subcutaneous Q24H   levothyroxine  75 mcg Oral QAC breakfast   lipase/protease/amylase  72,000 Units  Oral TID with meals   mouth rinse  15 mL Mouth Rinse BID   metoprolol tartrate  12.5 mg Oral BID   NIFEdipine  30 mg Oral Daily   pantoprazole  40 mg Oral Daily   potassium chloride  40 mEq Oral Daily   pregabalin  25 mg Oral Once   pregabalin  25 mg Oral BID   tiZANidine  4 mg Oral QHS   topiramate  100 mg Oral q morning   umeclidinium bromide  1 puff Inhalation Daily   Continuous Infusions:  sodium chloride Stopped (01/24/21 2119)   cefTRIAXone (ROCEPHIN)  IV Stopped (01/25/21 1547)   heparin 1,150 Units/hr (01/26/21 0720)   PRN Meds: sodium chloride, acetaminophen **OR** acetaminophen, albuterol, benzonatate, fluticasone, guaiFENesin-dextromethorphan, hydrOXYzine, labetalol, lip balm, loperamide, LORazepam, morphine injection, nitroGLYCERIN, ondansetron **OR** ondansetron (ZOFRAN) IV, oxyCODONE, phenol, zolpidem  Allergies:    Allergies  Allergen Reactions   Levofloxacin Other (See Comments)    Made head feel weird   Nortriptyline Other (See Comments)    Caused rectal bleeding     Social History:   Social History   Socioeconomic History   Marital status: Legally Separated    Spouse name: Syriah Delisi   Number of children: Not on file   Years of education: Not on file   Highest education level: Not on file  Occupational History    Employer: DISABLED  Tobacco Use   Smoking status: Former    Packs/day: 2.50    Years: 10.00    Pack years: 25.00    Types: Cigarettes   Smokeless tobacco: Never  Substance and Sexual Activity   Alcohol use: No   Drug use: No   Sexual activity: Not on file  Other Topics Concern   Not on file  Social History Narrative   Pt would like [name] at [phone] (nephew) to be called in case of emergency and ask him to call her daughter [name] at [phone].   Social Determinants of Health   Financial Resource Strain: Not on file  Food Insecurity: Not on file  Transportation Needs: Not on file  Physical  Activity: Not on file  Stress: Not on file  Social Connections: Not on file  Intimate Partner Violence: Not on file    Family History:    Family History  Problem Relation Age of Onset   Uterine cancer Mother      ROS:  Please see the history of present illness.   All other ROS reviewed and negative.     Physical Exam/Data:   Vitals:   01/25/21 1639 01/25/21 2049 01/26/21 0527 01/26/21 0748  BP: 128/73 137/72 136/74 (!) 165/82  Pulse: 75 80 66 77  Resp: 18 19 17 14   Temp: 99.2 F (37.3 C) 98.6 F (37 C) 98.4 F (36.9 C) 98 F (36.7 C)  TempSrc: Oral Oral Oral Oral  SpO2: 99% 98% 98% 97%  Weight:      Height:        Intake/Output Summary (Last 24 hours) at 01/26/2021 0918 Last data filed at 01/26/2021 0856 Gross per 24 hour  Intake 353.72 ml  Output --  Net 353.72 ml   Last 3 Weights 01/20/2021 01/20/2021 01/20/2021  Weight (lbs) 170 lb 175 lb 174 lb  Weight (kg) 77.111 kg 79.379 kg 78.926 kg     Body mass index is 28.29 kg/m.  General:  elderly female in NAD HEENT: normal Neck: no JVD Vascular: No carotid bruits; Distal pulses 2+ bilaterally Cardiac:  normal S1, S2; RRR; no murmur  Lungs:  clear to auscultation bilaterally, no wheezing, rhonchi or rales  Abd: soft, nontender, no hepatomegaly  Ext: no edema Musculoskeletal:  No deformities, BUE and BLE strength normal and equal Skin: warm and dry  Neuro:  CNs 2-12 intact, no focal abnormalities noted Psych:  Normal affect   EKG:  The EKG was personally reviewed and demonstrates:  sinus rhythm with HR 78 with  LBBB (known) Telemetry:  Telemetry was personally reviewed and demonstrates:  sinus to sinus tachycardia in the 80-100s  Relevant CV Studies:  none  Laboratory Data:  High Sensitivity Troponin:   Recent Labs  Lab 01/25/21 1700 01/25/21 1803 01/25/21 2001  TROPONINIHS 108* 126* 99*     Chemistry Recent Labs  Lab 01/23/21 0424 01/24/21 0450 01/25/21 0501 01/25/21 1700 01/26/21 0546   NA 138   < > 137 140 137  K 4.9   < > 3.4* 3.5 3.7  CL 113*   < > 105 109 110  CO2 20*   < > 25 24 22   GLUCOSE 223*   < > 193* 149* 156*  BUN 16   < > 20 15 12   CREATININE 0.82   < > 0.98 0.74 0.65  CALCIUM 8.0*   < > 8.2* 8.3* 8.5*  MG 2.5*  --   --  2.2 2.3  GFRNONAA >60   < > 60* >60 >60  ANIONGAP 5   < > 7 7 5    < > = values in this interval not displayed.    Recent Labs  Lab 01/25/21 0501 01/25/21 1700 01/26/21 0546  PROT 5.9* 6.2* 6.6  ALBUMIN 2.8* 2.9* 3.1*  AST 22 22 18   ALT 27 26 24   ALKPHOS 180* 187* 180*  BILITOT 0.6 0.4 0.6   Lipids  Recent Labs  Lab 01/26/21 0546  CHOL 168  TRIG 186*  HDL 22*  LDLCALC 109*  CHOLHDL 7.6    Hematology Recent Labs  Lab 01/23/21 0424 01/24/21 0450 01/26/21 0546  WBC 7.8 8.7 5.6  RBC 4.14 4.24 4.70  HGB 11.6* 11.9* 13.3  HCT 37.0 37.5 42.1  MCV 89.4 88.4 89.6  MCH 28.0 28.1 28.3  MCHC 31.4 31.7 31.6  RDW 14.4 14.4 14.2  PLT 103* 119* 151   Thyroid  Recent Labs  Lab 01/26/21 0546  TSH 5.557*    BNPNo results for input(s): BNP, PROBNP in the last 168 hours.  DDimer No results for input(s): DDIMER in the last 168 hours.   Radiology/Studies:  CT ABDOMEN PELVIS WO CONTRAST  Result Date: 01/24/2021 CLINICAL DATA:  Nephrolithiasis. EXAM: CT ABDOMEN AND PELVIS WITHOUT CONTRAST TECHNIQUE: Multidetector CT imaging of the abdomen and pelvis was performed following the standard protocol without IV contrast. COMPARISON:  CT abdomen and pelvis 01/21/2021. FINDINGS: Lower chest: Small bilateral pleural effusions have increased. There is Mariah Reed right lower lobe airspace disease and Mariah Reed left lower lobe atelectasis. Hepatobiliary: Gallbladder surgically absent. There is stable dilatation of the common bile duct measuring 12 mm. There is no intrahepatic biliary ductal dilatation. The liver is within normal limits. Pancreas: Unremarkable. No pancreatic ductal dilatation or surrounding inflammatory changes. Spleen: Normal in size  without focal abnormality. Adrenals/Urinary Tract: Right ureteral stent is unchanged in position. There is no hydronephrosis. 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 Small right peripelvic cyst is unchanged. No ureteral calculi are seen. The left kidney and ureter are within normal limits. 2.9 cm mildly hyperdense cyst in the superior pole the left kidney is unchanged. Otherwise, the left kidney and ureter are within normal limits. There is a small amount of air in the bladder. There is also hyperdensity seen throughout the bladder. The adrenal glands are within normal limits. Stomach/Bowel: Stomach is within normal limits. Appendix appears normal. No evidence of bowel wall thickening, distention, or inflammatory changes. The appendix is not seen. There is sigmoid colon diverticulosis without evidence for acute diverticulitis.  Vascular/Lymphatic: Aortic atherosclerosis. No enlarged abdominal or pelvic lymph nodes. Reproductive: Status post hysterectomy. No adnexal masses. Other: No abdominal wall hernia or abnormality. No abdominopelvic ascites. There is stable mild body wall edema. Musculoskeletal: No acute or significant osseous findings. IMPRESSION: 1. Right ureteral stent is unchanged in position. There is no hydronephrosis. 2. 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  No right ureteral calculi. 3. Small amount of air in the bladder which may be related to recent instrumentation or infection. The bladder is mildly hyperdense likely related to recent contrast administration. 4. Increasing small bilateral pleural effusions with Mariah Reed right lower lobe airspace disease and Mariah Reed left lower lobe atelectasis. 5. Body wall edema. 6.  Aortic Atherosclerosis (ICD10-I70.0). Electronically Signed   By: Ronney Asters M.D.   On: 01/24/2021 15:23   CT Angio Chest Pulmonary Embolism (PE) W or WO Contrast  Result Date: 01/23/2021 CLINICAL DATA:  Rule out pulmonary embolus. EXAM: CT ANGIOGRAPHY CHEST WITH  CONTRAST TECHNIQUE: Multidetector CT imaging of the chest was performed using the standard protocol during bolus administration of intravenous contrast. Multiplanar CT image reconstructions and MIPs were obtained to evaluate the vascular anatomy. CONTRAST:  97mL OMNIPAQUE IOHEXOL 350 MG/ML SOLN COMPARISON:  None. FINDINGS: Cardiovascular: Exam detail is diminished secondary to respiratory motion artifact. Pulmonary arterial opacification is also suboptimal. Within these limitations there is no signs of central pulmonary artery or lobar pulmonary artery embolus. Beyond the lobar pulmonary arteries exam detail is essentially nondiagnostic for pulmonary embolus. Heart size is upper limits of normal. No pericardial effusion. Aortic atherosclerosis. Mild LAD coronary artery calcifications. Mediastinum/Nodes: No enlarged mediastinal, hilar, or axillary lymph nodes. Thyroid gland, trachea, and esophagus demonstrate no significant findings. Lungs/Pleura: Significantly degraded exam detail of the lungs due to motion artifact. Small left pleural effusion and moderate right pleural effusion identified. There is diffuse interlobular septal thickening identified suggesting interstitial edema. Airspace consolidation versus compressive type atelectasis is noted overlying the right pleural effusion, image 97/7. Subpleural consolidation is also noted within the posterior and lateral right upper lobe, image 43/7.Indeterminate subpleural nodule is identified within the anterior left upper lobe measuring 1.2 cm, image 70/7. Upper Abdomen: Images through the upper abdomen are also degraded by motion artifact. Signs of previous cholecystectomy with increase caliber of the common bile duct is again noted and appears similar to CT from 01/21/2021. Musculoskeletal: No chest wall abnormality. No acute or significant osseous findings. Review of the MIP images confirms the above findings. IMPRESSION: 1. Exam detail is diminished secondary to  respiratory motion artifact and diminished pulmonary arterial opacification. Within these limitations there is no signs of central pulmonary artery or lobar pulmonary artery embolus. Beyond the lobar pulmonary arteries exam detail is essentially nondiagnostic for pulmonary embolus. 2. Bilateral pleural effusions, right greater than left. There is, bilateral interlobular septal thickening compatible with interstitial edema. Correlate for signs or symptoms of congestive heart failure. 3. Airspace consolidation versus compressive type atelectasis is noted overlying the right pleural effusion. Underlying infection not excluded. 4. Indeterminate subpleural nodule is identified within the anterior left upper lobe measuring 1.2 cm. Repeat CT of the chest following resolution of patient's current acute clinical condition is recommended for more definitive characterization. 5. Aortic Atherosclerosis (ICD10-I70.0). Electronically Signed   By: Kerby Moors M.D.   On: 01/23/2021 11:46   ECHOCARDIOGRAM COMPLETE  Result Date: 01/24/2021    ECHOCARDIOGRAM REPORT   Patient Name:   DEVI HOPMAN Date of Exam: 01/24/2021 Medical Rec #:  [id number]  Height:       65.0 in Accession #:    [id number]      Weight:       170.0 lb Date of Birth:  1945/06/08        BSA:          1.846 m Patient Age:    51 years        BP:           86/51 mmHg Patient Gender: F               HR:           76 bpm. Exam Location:  Inpatient Procedure: 2D Echo, Cardiac Doppler and Color Doppler Indications:    CHF  History:        Patient has no prior history of Echocardiogram examinations.                 Previous Myocardial Infarction, COPD, Arrythmias:LBBB; Risk                 Factors:Diabetes and Hypertension.  Sonographer:    Glo Herring Referring Phys: [place]  1. Left ventricular ejection fraction, by estimation, is 50 to 55%. The left ventricle has low normal function. The left ventricle has no regional wall  motion abnormalities. Left ventricular diastolic parameters were normal.  2. Right ventricular systolic function is normal. The right ventricular size is normal.  3. Left atrial size was mildly dilated.  4. The mitral valve is grossly normal. Trivial mitral valve regurgitation. No evidence of mitral stenosis.  5. The aortic valve is grossly normal. Aortic valve regurgitation is not visualized. No aortic stenosis is present.  6. The inferior vena cava is dilated in size with >50% respiratory variability, suggesting right atrial pressure of 8 mmHg. Comparison(s): No prior Echocardiogram. Conclusion(s)/Recommendation(s): Otherwise normal echocardiogram, with minor abnormalities described in the report. FINDINGS  Left Ventricle: Left ventricular ejection fraction, by estimation, is 50 to 55%. The left ventricle has low normal function. The left ventricle has no regional wall motion abnormalities. The left ventricular internal cavity size was normal in size. There is no left ventricular hypertrophy. Abnormal (paradoxical) septal motion, consistent with left bundle branch block. Left ventricular diastolic parameters were normal. Right Ventricle: The right ventricular size is normal. Right vetricular wall thickness was not well visualized. Right ventricular systolic function is normal. Left Atrium: Left atrial size was mildly dilated. Right Atrium: Right atrial size was normal in size. Pericardium: There is no evidence of pericardial effusion. Presence of epicardial fat layer. Mitral Valve: The mitral valve is grossly normal. Trivial mitral valve regurgitation. No evidence of mitral valve stenosis. Tricuspid Valve: The tricuspid valve is grossly normal. Tricuspid valve regurgitation is trivial. No evidence of tricuspid stenosis. Aortic Valve: The aortic valve is grossly normal. Aortic valve regurgitation is not visualized. No aortic stenosis is present. Aortic valve mean gradient measures 5.0 mmHg. Aortic valve peak  gradient measures 9.9 mmHg. Aortic valve area, by VTI measures 1.47 cm. Pulmonic Valve: The pulmonic valve was not well visualized. Pulmonic valve regurgitation is not visualized. Aorta: The aortic root, ascending aorta, aortic arch and descending aorta are all structurally normal, with no evidence of dilitation or obstruction. Venous: The inferior vena cava is dilated in size with greater than 50% respiratory variability, suggesting right atrial pressure of 8 mmHg. IAS/Shunts: The interatrial septum was not well visualized.  LEFT VENTRICLE PLAX 2D LVIDd:  4.50 cm      Diastology LVIDs:         2.80 cm      LV e' medial:    7.83 cm/s LV PW:         1.00 cm      LV E/e' medial:  12.4 LV IVS:        1.00 cm      LV e' lateral:   8.05 cm/s LVOT diam:     1.85 cm      LV E/e' lateral: 12.0 LV SV:         45 LV SV Index:   25 LVOT Area:     2.69 cm  LV Volumes (MOD) LV vol d, MOD A2C: 84.7 ml LV vol d, MOD A4C: 101.0 ml LV vol s, MOD A2C: 40.9 ml LV vol s, MOD A4C: 49.7 ml LV SV MOD A2C:     43.8 ml LV SV MOD A4C:     101.0 ml LV SV MOD BP:      49.7 ml RIGHT VENTRICLE             IVC RV Basal diam:  3.20 cm     IVC diam: 2.20 cm RV Mid diam:    2.20 cm RV S prime:     12.30 cm/s LEFT ATRIUM             Index        RIGHT ATRIUM           Index LA diam:        3.50 cm 1.90 cm/m   RA Area:     14.10 cm LA Vol (A2C):   59.0 ml 31.96 ml/m  RA Volume:   31.20 ml  16.90 ml/m LA Vol (A4C):   46.4 ml 25.13 ml/m LA Biplane Vol: 53.9 ml 29.20 ml/m  AORTIC VALVE                     PULMONIC VALVE AV Area (Vmax):    1.48 cm      PV Vmax:       0.95 m/s AV Area (Vmean):   1.45 cm      PV Peak grad:  3.6 mmHg AV Area (VTI):     1.47 cm AV Vmax:           157.00 cm/s AV Vmean:          109.000 cm/s AV VTI:            0.310 m AV Peak Grad:      9.9 mmHg AV Mean Grad:      5.0 mmHg LVOT Vmax:         86.20 cm/s LVOT Vmean:        58.800 cm/s LVOT VTI:          0.169 m LVOT/AV VTI ratio: 0.55  AORTA Ao Root diam: 2.90  cm MITRAL VALVE MV Area (PHT): 3.81 cm    SHUNTS MV Decel Time: 199 msec    Systemic VTI:  0.17 m MV E velocity: 96.80 cm/s  Systemic Diam: 1.85 cm MV A velocity: 96.40 cm/s MV E/A ratio:  1.00 Buford Dresser MD Electronically signed by Buford Dresser MD Signature Date/Time: 01/24/2021/3:15:12 PM    Final      Assessment and Plan:   Chest pain Troponin elevation - risk factors for ACS include smoking history, DM,  HTN, HLD - mild and flat troponin elevation in  the setting of acute illness/sepsis and rhabdomyolysis - suspect demand ischemia - CTA with mild LAD disease and aortic atherosclerosis  - description of chest pain is atypical with unclear onset/duration and features, not exertional - she has previously refused stress testing - she is adamant that she has a doctor a friend has referred her to and she will follow with them - she doesn't know the group or provider name - I will order echo to be completed here   Mild LAD CAD, aortic atherosclerosis - recommend risk factor modification - need better glucose control,   01/26/2021: Cholesterol 168; HDL 22; LDL Cholesterol 109; Triglycerides 186; VLDL 37 - she is on 40 mg lovastatin - consider transitioning to high intensity statin   LBBB - known since 2011 - echo as above, she denies syncope   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 - lactic acid has normalized - blood cultures negative - urine cultures with E coli resistant to ampicillin, proteus resistant to nitrofurantoin - continue ABX   Rhabdomyolysis - resolved, IVF stopped   Palpitations Tachycardia - telemetry showed episodes of sinus tachycardia, not sustained - primarily sinus rhythm - she reports "heart racing" for which she self-treats with PRN metoprolol - she reports history of Afib, I do not see this documentation - can consider OP heart monitor with her Mariah Reed cardiologist - continue BB PRN   Hypertension -  maintained on hydralazine 25 mg TID and procardia at home - she has PRN BB - she also has PRN spironolactone 50 mg for swelling - BP poorly controlled - consider restarting hydralazine   DM2 with hyperglycemia - A1c this admission 10.5% - she did not tolerate metformin - doing well on jardiance - consider endocrinology consult OP - has significant neuropathy   COPD GERD Hypothyroidism  Chronic pain - per primary, needs pain clinic  Risk Assessment/Risk Scores:     HEAR Score (for undifferentiated chest pain):  HEAR Score: 4    For questions or updates, please contact [place] Please consult [email or web address] for contact info under    Signed, Ledora Bottcher, PA  01/26/2021 9:18 AM As above, patient seen and examined.  Briefly she is a 75 year old female with past medical history of hypertension, diabetes mellitus, hyperlipidemia, COPD, neuropathy for evaluation of chest pain.  Patient admitted with abdominal pain and rhabdomyolysis.  Also found to have obstructing calculus at right UJV and hydronephrosis of the right kidney.  She has been treated with antibiotics and had cystoscopy/stent placement on January 20, 2021.  Patient has complained of chest pain and cardiology asked to evaluate.  She states her pain is in the left axillary area radiating towards the breast.  She is very nonspecific about her pain.  She states it is "a deep pain".  She finds it difficult to quantitate the duration but states it can last "maybe hours".  When asked about when her chest pain initially began she is unable to answer this question.  There is no associated symptoms.  Is not pleuritic.  There is a question of whether it increases with moving her left arm.  She denies dyspnea. Troponins are 108, 126 and 99.  Electrocardiogram shows normal sinus rhythm with left bundle branch block which is old.  Echocardiogram shows normal LV function with ejection fraction 50 to 55% and mild left atrial  enlargement. 1 chest pain-symptoms are extremely atypical.  Patient's left bundle branch block is old.  Her enzymes are not diagnostic of acute coronary  syndrome.  Discontinue IV heparin and treat with DVT prophylaxis Heparin.  She is concerned about cardiac origin.  I will arrange a [place] nuclear study to screen for ischemia.  Note she was previously seen by Dr. Rockey Reed.  She also request to be seen in [place] and states she has a friend who knows a good cardiologist there.  She has requested that we not make a follow-up appointment but instead she will contact that cardiologist for follow-up in the future.  I explained to her that she can contact us if there is any difficulties making an appointment and we would be happy to see her.  2 minimally elevated troponin-as above it is not consistent with acute coronary syndrome.  Also occurred in the setting of rhabdomyolysis.  Discontinue IV heparin.  3 aortic atherosclerosis/coronary calcification/diabetes mellitus-patient would benefit from a statin long-term.  4 urosepsis/recent urological procedure-Per urology.  5 hypertension-blood pressure appears to be controlled.  Continue present medications.  Kirk Ruths, MD

## 2021-01-26 NOTE — Progress Notes (Signed)
ANTICOAGULATION CONSULT NOTE - Follow Up Consult  Pharmacy Consult for Heparin IV Indication: chest pain/ACS  Allergies  Allergen Reactions   Levofloxacin Other (See Comments)    Made head feel weird   Nortriptyline Other (See Comments)    Caused rectal bleeding     Patient Measurements: Height: 5\' 5"  (165.1 cm) Weight: 77.1 kg (170 lb) IBW/kg (Calculated) : 57 Heparin Dosing Weight: 73 kg  Vital Signs: Temp: 98.4 F (36.9 C) (12/22 0527) Temp Source: Oral (12/22 0527) BP: 136/74 (12/22 0527) Pulse Rate: 66 (12/22 0527)  Labs: Recent Labs    01/24/21 0450 01/25/21 0501 01/25/21 1700 01/25/21 1803 01/25/21 2001 01/26/21 0546  HGB 11.9*  --   --   --   --  13.3  HCT 37.5  --   --   --   --  42.1  PLT 119*  --   --   --   --  151  HEPARINUNFRC  --   --   --   --   --  <0.10*  CREATININE 0.91 0.98 0.74  --   --   --   CKTOTAL 188 147  --   --   --   --   TROPONINIHS  --   --  108* 126* 99*  --     Estimated Creatinine Clearance: 61.4 mL/min (by C-G formula based on SCr of 0.74 mg/dL).   Medications:  Infusions:   sodium chloride Stopped (01/24/21 2119)   cefTRIAXone (ROCEPHIN)  IV Stopped (01/25/21 1547)   heparin 900 Units/hr (01/25/21 2308)    Assessment: 18 yoF with PMH HLD, HTN, recurrent chest pain, admitted for acute emphysematous pyelonephritis. Again with chest pain and elevated troponins. Cardiology consulted, and Pharmacy to dose IV heparin for ACS.    Today, 01/25/2021: Heparin level low/undetectable at < 0.1 on heparin 900 units/hr CBC: Hgb increased (11.9 > 13.3), Plt inc to WNL (119 > 151) No bleeding or infusion issues per nursing Infusion has been without pauses or interruptions overnight per RN SCr stable WNL  Goal of Therapy:  Heparin level 0.3-0.7 units/ml Monitor platelets by anticoagulation protocol: Yes   Plan:  Give heparin 2000 units bolus IV x 1 Increase to heparin IV infusion at 1150 units/hr Heparin level 6 hours after  rate change Daily heparin level and CBC Continue to monitor H&H and platelets  Gretta Arab PharmD, BCPS Clinical Pharmacist [facility] main pharmacy [phone] 01/26/2021 7:11 AM

## 2021-01-26 NOTE — Progress Notes (Addendum)
Patient complained of left chest pain 9/10 radiating to right breast. Nitroglycerin administered x 1. Pt stated pain  0/10 post administration.   The second dose of Nitroglycerin was pulled from Pyxis, pt refused. Medication wasted in steri-cycle witnessed by 2 RNs Jefferey Pica and [place].

## 2021-01-26 NOTE — Care Management Important Message (Signed)
Important Message  Patient Details IM Letter given to the Patient. Name: Mariah Reed MRN: [id number] Date of Birth: 1946-01-20   Medicare Important Message Given:  Yes     Kerin Salen 01/26/2021, 10:18 AM

## 2021-01-26 NOTE — Progress Notes (Signed)
PROGRESS NOTE    Mariah Reed  NWG:[id number] DOB: 1945-05-27 DOA: 01/20/2021 PCP: Gladstone Lighter, MD   Brief Narrative:  75 y.o. WF PMHx seasonal allergies, COPD, type II,  DM uncontrolled with complication, DM peripheral neuropathy, Gout, hyperlipidemia, hypertension, chronic lower back pain,    Coming from [facility] ED to the emergency department due to severe sepsis in the setting of acute emphysematous pyelonephritis     Subjective: 12/22 patient told Dr. Kirk Ruths cardiology completely different story from the 1 she related to me yesterday.  She informed Dr. Stanford Breed that she has a doctor in town that she was referred to by her friend (does not know physician name/practice name).      Assessment & Plan:  Covid vaccination;  Principal Problem:   Acute pyonephrosis Active Problems:   Diabetes mellitus type 2 with complications ([place])   HLD (hyperlipidemia)   GOUT   Essential hypertension   COPD (chronic obstructive pulmonary disease) (HCC)   Gastroesophageal reflux disease without esophagitis   Hypothyroidism   Severe sepsis (HCC)   Hydronephrosis of right kidney   Hydroureter on right   Rhabdomyolysis   Demand ischemia (HCC)   Elevated troponin  Severe sepsis POA / Acute pyonephrosis with Hydronephrosis of right kidney and Hydroureter on right Status post retrogrades cystoscopy, Staghorn stone removal and stent placement. Continued on Zosyn every 8 hours. Blood cultures negative Urine cultures pos for ecoli resistant to ampicillin, proteus resistant to nitrofurantoin Urology consult and intervention appreciated. Will require PCNL to treat large 4cm staghorn stone, can be arranged as outpatient -12/22 urology recommends culture specific abx x 2 weeks.  In a.m. will speak with ID/pharmacy what is best p.o. medication to transition to in preparation for discharge   Rhabdomyolysis -Resolved    DM type II uncontrolled with complication/DM  Neuropathy -12/17 hemoglobin A1c= 10.5   - 12/22 increase Levemir 10 units daily -10/22 NovoLog 5 unitsQAC -Moderate SSI  HLD (hyperlipidemia) Hold lovastatin as per above   GOUT Continue allopurinol. -12/21 uric acid = 3.0 at goal    Essential hypertension -Procardia XL, 30mg  daily. -Metoprolol 12.5 mg BID Given soft BP, held spironolactone  Tachycardia New finding on 12/29. Noted to have HR into the 140's Resolved after starting metoprolol  Chest pain - 12/21 EKG: NSR with LBBB -12/21 consulted to cardiology will evaluate patient.  Will await recommendations -See essential HTN -12/21 Heparin drip  Elevated troponin/Demand ischemia - 12/22 overnight patient's troponins elevated, but has now flattened out    Latest Reference Range & Units 01/25/21 17:00 01/25/21 18:03 01/25/21 20:01  Troponin I (High Sensitivity) <18 ng/L 108 (HH) 126 (HH) 99 (H)  -Consistent with demand ischemia secondary to her acute illness. -12/22 scheduled for nuclear med stress test in a.m. -Patient does not know cardiologist in town.  States she has a friend who may know a cardiologist in town [employer] officer, [employer])  COPD (chronic obstructive pulmonary disease) ([place]) Nebs as needed This AM, requiring as high as 8L high flow O2 Ordered and reviewed CTA chest. Neg for PE, however evidence of  B effusions with findings suggestive of fluid, see below  Acute respiratory failure with hypoxia -New finding this 12/19 -O2 requirement up to [facility] now weaning -Given CT chest findings of effusion with concerns of fluid, pt has received IV lasix with improvovement and O2 that is weaning -12/21 improving, given patient's chest pain continue O2.  Titrate to maintain SPO2> 92%      Gastroesophageal reflux disease without  esophagitis Continue PPI.   Hypothyroidism -12/21 TSH 5.5 elevated -12/22 increase levothyroxine on 100 mcg daily.  Recheck TSH in 6 weeks        DVT prophylaxis: Heparin drip Code Status:  Full Family Communication:  Status is: Inpatient    Dispo: The patient is from: Home              Anticipated d/c is to: Home              Anticipated d/c date is: 3 days              Patient currently is not medically stable to d/c.      Consultants:  Cardiology    Procedures/Significant Events:  12/20 Echocardiogram Left Ventricle: Left ventricular ejection fraction, by estimation, is 50  to 55%. The left ventricle has low normal function. The left ventricle has  no regional wall motion abnormalities. The left ventricular internal  cavity size was normal in size.  There is no left ventricular hypertrophy. Abnormal (paradoxical) septal  motion, consistent with left bundle branch block. Left ventricular  diastolic parameters were normal.   Right Ventricle: The right ventricular size is normal. Right vetricular  wall thickness was not well visualized. Right ventricular systolic  function is normal.   Left Atrium: Left atrial size was mildly dilated.   Right Atrium: Right atrial size was normal in size.   Pericardium: There is no evidence of pericardial effusion. Presence of  epicardial fat layer.      I have personally reviewed and interpreted all radiology studies and my findings are as above.  VENTILATOR SETTINGS:    Cultures   Antimicrobials: Anti-infectives (From admission, onward)    Start     Ordered Stop   01/25/21 1200  cefTRIAXone (ROCEPHIN) 2 g in sodium chloride 0.9 % 100 mL IVPB        01/25/21 1114     01/20/21 1800  piperacillin-tazobactam (ZOSYN) IVPB 3.375 g  Status:  Discontinued        01/20/21 1605 01/25/21 1114   01/20/21 1045  piperacillin-tazobactam (ZOSYN) IVPB 3.375 g        01/20/21 1038 01/20/21 1558   01/20/21 0800  piperacillin-tazobactam (ZOSYN) IVPB 3.375 g        01/20/21 0759 01/20/21 0940         Devices    LINES / TUBES:      Continuous Infusions:  sodium chloride Stopped (01/24/21 2119)   cefTRIAXone  (ROCEPHIN)  IV 2 g (01/26/21 1335)     Objective: Vitals:   01/26/21 0748 01/26/21 1413 01/26/21 1434 01/26/21 2030  BP: (!) 165/82 (!) 132/55 (!) 145/64 (!) 156/64  Pulse: 77 78 80 87  Resp: 14 16  18   Temp: 98 F (36.7 C) 98.6 F (37 C)  98.2 F (36.8 C)  TempSrc: Oral Oral  Oral  SpO2: 97% 92%  95%  Weight:      Height:        Intake/Output Summary (Last 24 hours) at 01/26/2021 2107 Last data filed at 01/26/2021 1800 Gross per 24 hour  Intake 690.46 ml  Output 500 ml  Net 190.46 ml   Filed Weights   01/20/21 0600 01/20/21 1021 01/20/21 1151  Weight: 78.9 kg 79.4 kg 77.1 kg    Examination:  General: A/O x4, positive acute respiratory distress Eyes: negative scleral hemorrhage, negative anisocoria, negative icterus ENT: Negative Runny nose, negative gingival bleeding, Neck:  Negative scars, masses, torticollis, lymphadenopathy, JVD Lungs:  Clear to auscultation bilaterally without wheezes or crackles Cardiovascular: Regular rate and rhythm without murmur gallop or rub normal S1 and S2, positive chest pain left lateral without radiation, negative diaphoresis Abdomen: negative abdominal pain, nondistended, positive soft, bowel sounds, no rebound, no ascites, no appreciable mass Extremities: No significant cyanosis, clubbing, or edema bilateral lower extremities Skin: Negative rashes, lesions, ulcers Psychiatric:  Negative depression, negative anxiety, negative fatigue, negative mania  Central nervous system:  Cranial nerves II through XII intact, tongue/uvula midline, all extremities muscle strength 5/5, sensation intact throughout, negative dysarthria, negative expressive aphasia, negative receptive aphasia.  .     Data Reviewed: Care during the described time interval was provided by me .  I have reviewed this patient's available data, including medical history, events of note, physical examination, and all test results as part of my evaluation.   CBC: Recent Labs   Lab 01/20/21 0830 01/21/21 0149 01/21/21 0816 01/22/21 0326 01/23/21 0424 01/24/21 0450 01/26/21 0546  WBC 17.9*   < > 10.1 10.2 7.8 8.7 5.6  NEUTROABS 16.6*  --   --   --   --   --  3.0  HGB 14.6   < > 11.6* 11.4* 11.6* 11.9* 13.3  HCT 45.5   < > 37.1 37.2 37.0 37.5 42.1  MCV 89.4   < > 89.8 91.0 89.4 88.4 89.6  PLT 130*   < > 76* 86* 103* 119* 151   < > = values in this interval not displayed.   Basic Metabolic Panel: Recent Labs  Lab 01/23/21 0424 01/24/21 0450 01/25/21 0501 01/25/21 1700 01/26/21 0546  NA 138 138 137 140 137  K 4.9 4.0 3.4* 3.5 3.7  CL 113* 109 105 109 110  CO2 20* 22 25 24 22   GLUCOSE 223* 166* 193* 149* 156*  BUN 16 17 20 15 12   CREATININE 0.82 0.91 0.98 0.74 0.65  CALCIUM 8.0* 8.0* 8.2* 8.3* 8.5*  MG 2.5*  --   --  2.2 2.3  PHOS  --   --   --  2.6 2.5   GFR: Estimated Creatinine Clearance: 61.4 mL/min (by C-G formula based on SCr of 0.65 mg/dL). Liver Function Tests: Recent Labs  Lab 01/23/21 0424 01/24/21 0450 01/25/21 0501 01/25/21 1700 01/26/21 0546  AST 44* 27 22 22 18   ALT 30 29 27 26 24   ALKPHOS 194* 208* 180* 187* 180*  BILITOT 0.9 0.8 0.6 0.4 0.6  PROT 5.4* 5.5* 5.9* 6.2* 6.6  ALBUMIN 2.7* 2.6* 2.8* 2.9* 3.1*   Recent Labs  Lab 01/20/21 0830  LIPASE 18   No results for input(s): AMMONIA in the last 168 hours. Coagulation Profile: Recent Labs  Lab 01/20/21 0830  INR 1.2   Cardiac Enzymes: Recent Labs  Lab 01/22/21 0326 01/23/21 0424 01/24/21 0450 01/25/21 0501 01/26/21 0546  CKTOTAL 1,445* 564* 188 147 78   BNP (last 3 results) No results for input(s): PROBNP in the last 8760 hours. HbA1C: No results for input(s): HGBA1C in the last 72 hours. CBG: Recent Labs  Lab 01/25/21 2044 01/25/21 2301 01/26/21 0825 01/26/21 1134 01/26/21 1704  GLUCAP 184* 175* 147* 246* 164*   Lipid Profile: Recent Labs    01/26/21 0546  CHOL 168  HDL 22*  LDLCALC 109*  TRIG 186*  CHOLHDL 7.6   Thyroid Function  Tests: Recent Labs    01/26/21 0546  TSH 5.557*   Anemia Panel: No results for input(s): VITAMINB12, FOLATE, FERRITIN, TIBC, IRON, RETICCTPCT in the last 72  hours. Urine analysis:    Component Value Date/Time   COLORURINE YELLOW 01/20/2021 1421   APPEARANCEUR HAZY (A) 01/20/2021 1421   LABSPEC 1.005 01/20/2021 1421   PHURINE 6.0 01/20/2021 1421   GLUCOSEU >=500 (A) 01/20/2021 1421   GLUCOSEU NEGATIVE 03/17/2019 1135   HGBUR LARGE (A) 01/20/2021 1421   HGBUR negative 12/15/2009 1001   BILIRUBINUR NEGATIVE 01/20/2021 1421   BILIRUBINUR Negative 07/17/2010 1136   KETONESUR NEGATIVE 01/20/2021 1421   PROTEINUR NEGATIVE 01/20/2021 1421   UROBILINOGEN 0.2 03/17/2019 1135   NITRITE NEGATIVE 01/20/2021 1421   LEUKOCYTESUR MODERATE (A) 01/20/2021 1421   Sepsis Labs: @LABRCNTIP (procalcitonin:4,lacticidven:4)  ) Recent Results (from the past 240 hour(s))  Blood Culture (routine x 2)     Status: None   Collection Time: 01/20/21  8:10 AM   Specimen: BLOOD  Result Value Ref Range Status   Specimen Description BLOOD BLOOD LEFT HAND  Final   Special Requests   Final    BOTTLES DRAWN AEROBIC AND ANAEROBIC Blood Culture results may not be optimal due to an excessive volume of blood received in culture bottles   Culture   Final    NO GROWTH 5 DAYS Performed at [facility], [place] [address], [place], [place] [address]    Report Status 01/25/2021 FINAL  Final  Resp Panel by RT-PCR (Flu A&B, Covid) Nasopharyngeal Swab     Status: None   Collection Time: 01/20/21  8:14 AM   Specimen: Nasopharyngeal Swab; Nasopharyngeal(NP) swabs in vial transport medium  Result Value Ref Range Status   SARS Coronavirus 2 by RT PCR NEGATIVE NEGATIVE Final    Comment: (NOTE) SARS-CoV-2 target nucleic acids are NOT DETECTED.  The SARS-CoV-2 RNA is generally detectable in upper respiratory specimens during the acute phase of infection. The lowest concentration of SARS-CoV-2 viral copies this assay can  detect is 138 copies/mL. A negative result does not preclude SARS-Cov-2 infection and should not be used as the sole basis for treatment or other patient management decisions. A negative result may occur with  improper specimen collection/handling, submission of specimen other than nasopharyngeal swab, presence of viral mutation(s) within the areas targeted by this assay, and inadequate number of viral copies(<138 copies/mL). A negative result must be combined with clinical observations, patient history, and epidemiological information. The expected result is Negative.  Fact Sheet for Patients:  EntrepreneurPulse.com.au  Fact Sheet for Healthcare Providers:  IncredibleEmployment.be  This test is no t yet approved or cleared by the [place] FDA and  has been authorized for detection and/or diagnosis of SARS-CoV-2 by FDA under an Emergency Use Authorization (EUA). This EUA will remain  in effect (meaning this test can be used) for the duration of the COVID-19 declaration under Section 564(b)(1) of the Act, 21 U.S.C.section 360bbb-3(b)(1), unless the authorization is terminated  or revoked sooner.       Influenza A by PCR NEGATIVE NEGATIVE Final   Influenza B by PCR NEGATIVE NEGATIVE Final    Comment: (NOTE) The Xpert Xpress SARS-CoV-2/FLU/RSV plus assay is intended as an aid in the diagnosis of influenza from Nasopharyngeal swab specimens and should not be used as a sole basis for treatment. Nasal washings and aspirates are unacceptable for Xpert Xpress SARS-CoV-2/FLU/RSV testing.  Fact Sheet for Patients: EntrepreneurPulse.com.au  Fact Sheet for Healthcare Providers: IncredibleEmployment.be  This test is not yet approved or cleared by the [place] FDA and has been authorized for detection and/or diagnosis of SARS-CoV-2 by FDA under an Emergency Use Authorization (EUA).  This EUA will remain in  effect (meaning this test can be used) for the duration of the COVID-19 declaration under Section 564(b)(1) of the Act, 21 U.S.C. section 360bbb-3(b)(1), unless the authorization is terminated or revoked.  Performed at [facility], [place] [address], [place], [place] [address]   Blood Culture (routine x 2)     Status: None   Collection Time: 01/20/21 10:50 AM   Specimen: BLOOD  Result Value Ref Range Status   Specimen Description   Final    BLOOD RIGHT ANTECUBITAL Performed at [facility] [address], [place], [place] [address]    Special Requests   Final    BOTTLES DRAWN AEROBIC AND ANAEROBIC Blood Culture adequate volume Performed at [place] [address], [place], [place] [address]    Culture   Final    NO GROWTH 5 DAYS Performed at [facility], [place] [address] [place], [place] [address]    Report Status 01/25/2021 FINAL  Final  Urine Culture     Status: Abnormal   Collection Time: 01/20/21  2:21 PM   Specimen: In/Out Cath Urine  Result Value Ref Range Status   Specimen Description   Final    IN/OUT CATH URINE Performed at [facility] [address], [place], [place] [address]    Special Requests   Final    NONE Performed at [facility], [place] [address] [place], [place] [address]    Culture (A)  Final    40,000 COLONIES/mL ESCHERICHIA COLI 20,000 COLONIES/mL PROTEUS MIRABILIS    Report Status 01/23/2021 FINAL  Final   Organism ID, Bacteria ESCHERICHIA COLI (A)  Final   Organism ID, Bacteria PROTEUS MIRABILIS (A)  Final      Susceptibility   Escherichia coli - MIC*    AMPICILLIN >=32 RESISTANT Resistant     CEFAZOLIN <=4 SENSITIVE Sensitive     CEFEPIME <=0.12 SENSITIVE Sensitive     CEFTRIAXONE <=0.25 SENSITIVE Sensitive     CIPROFLOXACIN <=0.25 SENSITIVE Sensitive     GENTAMICIN <=1 SENSITIVE Sensitive     IMIPENEM <=0.25 SENSITIVE Sensitive     NITROFURANTOIN  <=16 SENSITIVE Sensitive     TRIMETH/SULFA <=20 SENSITIVE Sensitive     AMPICILLIN/SULBACTAM 16 INTERMEDIATE Intermediate     PIP/TAZO <=4 SENSITIVE Sensitive     * 40,000 COLONIES/mL ESCHERICHIA COLI   Proteus mirabilis - MIC*    AMPICILLIN <=2 SENSITIVE Sensitive     CEFAZOLIN 8 SENSITIVE Sensitive     CEFEPIME <=0.12 SENSITIVE Sensitive     CEFTRIAXONE <=0.25 SENSITIVE Sensitive     CIPROFLOXACIN <=0.25 SENSITIVE Sensitive     GENTAMICIN <=1 SENSITIVE Sensitive     IMIPENEM 4 SENSITIVE Sensitive     NITROFURANTOIN RESISTANT Resistant     TRIMETH/SULFA <=20 SENSITIVE Sensitive     AMPICILLIN/SULBACTAM <=2 SENSITIVE Sensitive     PIP/TAZO <=4 SENSITIVE Sensitive     * 20,000 COLONIES/mL PROTEUS MIRABILIS  MRSA Next Gen by PCR, Nasal     Status: None   Collection Time: 01/20/21  4:04 PM   Specimen: Nasal Mucosa; Nasal Swab  Result Value Ref Range Status   MRSA by PCR Next Gen NOT DETECTED NOT DETECTED Final    Comment: (NOTE) The GeneXpert MRSA Assay (FDA approved for NASAL specimens only), is one component of a comprehensive MRSA colonization surveillance program. It is not intended to diagnose MRSA infection nor to guide or monitor treatment for MRSA  infections. Test performance is not FDA approved in patients less than 57 years old. Performed at [facility], [place] [address], [place], [place] [address]          Radiology Studies: No results found.      Scheduled Meds:  alum & mag hydroxide-simeth  30 mL Oral BID   diclofenac  2 patch Transdermal BID   diphenoxylate-atropine  2 tablet Oral BID   heparin injection (subcutaneous)  5,000 Units Subcutaneous Q8H   insulin aspart  0-15 Units Subcutaneous TID WC   insulin aspart  3 Units Subcutaneous TID WC   insulin detemir  10 Units Subcutaneous Q24H   [START ON 01/27/2021] levothyroxine  100 mcg Oral QAC breakfast   lipase/protease/amylase  72,000 Units Oral TID with meals   mouth rinse  15 mL  Mouth Rinse BID   metoprolol tartrate  12.5 mg Oral BID   NIFEdipine  30 mg Oral Daily   pantoprazole  40 mg Oral Daily   potassium chloride  40 mEq Oral Daily   pregabalin  25 mg Oral Once   pregabalin  25 mg Oral BID   tiZANidine  4 mg Oral QHS   topiramate  100 mg Oral q morning   umeclidinium bromide  1 puff Inhalation Daily   Continuous Infusions:  sodium chloride Stopped (01/24/21 2119)   cefTRIAXone (ROCEPHIN)  IV 2 g (01/26/21 1335)     LOS: 6 days   The patient is critically ill with multiple organ systems failure and requires high complexity decision making for assessment and support, frequent evaluation and titration of therapies, application of advanced monitoring technologies and extensive interpretation of multiple databases. Critical Care Time devoted to patient care services described in this note  Time spent: 40 minutes     Brad Mcgaughy, Geraldo Docker, MD [facility]   If 7PM-7AM, please contact night-coverage 01/26/2021, 9:07 PM

## 2021-01-26 NOTE — Progress Notes (Addendum)
6 Days Post-Op Subjective: Denies abdominal pain or flank pain. Tolerating indwelling ureteral stent.  She did have some chest pain earlier that has resolved after nitroglycerin was given.  She remains afebrile.  She is voiding without difficulty.  She is requiring 2 L O2.  Objective: Vital signs in last 24 hours: Temp:  [98 F (36.7 C)-99.2 F (37.3 C)] 98.6 F (37 C) (12/22 1413) Pulse Rate:  [66-80] 80 (12/22 1434) Resp:  [14-19] 16 (12/22 1413) BP: (128-165)/(55-82) 145/64 (12/22 1434) SpO2:  [92 %-99 %] 92 % (12/22 1413)  Intake/Output from previous day: 12/21 0701 - 12/22 0700 In: 140.9 [I.V.:101.5; IV Piggyback:39.4] Out: 900 [Urine:900] c Intake/Output this shift: Total I/O In: 249 [P.O.:240; I.V.:9] Out: 500 [Urine:500] clear yellow  Physical Exam:  General: Alert and oriented CV: RRR Lungs: Clear Abdomen: Soft, ND, NT Ext: NT, No erythema  Lab Results: Recent Labs    01/24/21 0450 01/26/21 0546  HGB 11.9* 13.3  HCT 37.5 42.1   BMET Recent Labs    01/25/21 1700 01/26/21 0546  NA 140 137  K 3.5 3.7  CL 109 110  CO2 24 22  GLUCOSE 149* 156*  BUN 15 12  CREATININE 0.74 0.65  CALCIUM 8.3* 8.5*     Studies/Results: No results found.  Assessment/Plan: Right emphysematous pyelitis with pyonephrosis: CT A/P 01/20/2021 with 3 mm obstructing right distal ureteral stone, gas in the collecting system, large staghorn stone in the right lower pole extending the collecting system with gas in the right lower pole, significant right-sided perinephric stranding suggesting rupture of a calyx.  Afebrile, WBC 17.9, creatinine 1.7, urinalysis indicating infection. S/p cysto, R RPG, R stent 01/20/2021.  -CT 01/24/21 with R stent in appropriate position, no hydronephrosis, small amount of air in bladder however no residual air in collecting system. 2. AKI: Creatinine 1.7 from baseline of 1 at time of consultation. Resolved 3. Distal right ureteral and large staghorn  right renal stone (4x1.7x1.9cm) 4. Rhabdomyolysis: CK trended down  5. Acute hypoxemia respiratory failure: Pleural effusion on CT, requiring lasix 6. Chest pain with minor elevation in troponin: Eval by cards with no suspicion of ACS 6. PMH of DM, HTN, HLD, Gout  -CT A/P 01/24/2021 with right ureteral stent in appropriate position with no hydronephrosis and resolved air in her collecting system. -Urine culture with 40 K E. coli and 20 K. Proteus -Continue ceftriaxone. -We will require urine culture specific antibiotics for 2 weeks. -Discussed that she will require PCNL to treat her large 4cm staghorn stone. This will be done outpatient assuming she is an operative candidate after she recovers.  I tried to arrange follow-up in my office.  She states that she would rather be treated closer to home near [place].  I did discuss that she can follow-up with [place].  She states that she is going to see a nephrologist and she would like for him to arrange follow-up with [facility] u[facility].  I will place follow-up information in her chart. She understands the stent is temporary. -I have attempted to call both of her daughters while standing with her at bedside.  Unfortunately the numbers that she is providing me are not in service.  She has no other family member that she would like for me to call.  She understands that given his history of emphysematous pyelitis, although she is making a good recovery is associated with a 20% mortality rate -Following peripherally.  Please call with questions.   LOS: 6 days  Matt R. Jessie Cowher MD 01/26/2021, 4:06 PM [facility]  Pager: [phone]

## 2021-01-26 NOTE — Progress Notes (Signed)
Inpatient Diabetes Program Recommendations  AACE/ADA: New Consensus Statement on Inpatient Glycemic Control (2015)  Target Ranges:  Prepandial:   less than 140 mg/dL      Peak postprandial:   less than 180 mg/dL (1-2 hours)      Critically ill patients:  140 - 180 mg/dL   Lab Results  Component Value Date   GLUCAP 246 (H) 01/26/2021   HGBA1C 10.5 (H) 01/21/2021    Review of Glycemic Control  Latest Reference Range & Units 01/25/21 20:44 01/25/21 23:01 01/26/21 08:25 01/26/21 11:34  Glucose-Capillary 70 - 99 mg/dL 184 (H) 175 (H) 147 (H) 246 (H)   Diabetes history: DM 2 Outpatient Diabetes medications:  Jardiance 25 mg daily, Glucotrol 10 mg bid Current orders for Inpatient glycemic control:  Novolog 5 units tid with meals Levemir 10 unit q HS Novolog moderate tid with meals  Inpatient Diabetes Program Recommendations:    Consider reducing Novolog meal coverage to 3 units tid with meals.   Thanks,  Adah Perl, RN, BC-ADM Inpatient Diabetes Coordinator Pager [phone]  (8a-5p)

## 2021-01-26 NOTE — Progress Notes (Signed)
QTc greater than 500 - MD notified.  Will continue to follow.

## 2021-01-27 ENCOUNTER — Ambulatory Visit (HOSPITAL_COMMUNITY)
Admit: 2021-01-27 | Discharge: 2021-01-27 | Disposition: A | Discharge status: Home / Self Care | Payer: Medicare Other | Source: Ambulatory Visit | Attending: Physician Assistant | Admitting: Physician Assistant

## 2021-01-27 DIAGNOSIS — E118 Type 2 diabetes mellitus with unspecified complications: Secondary | ICD-10-CM | POA: Diagnosis not present

## 2021-01-27 DIAGNOSIS — N136 Pyonephrosis: Secondary | ICD-10-CM | POA: Diagnosis not present

## 2021-01-27 DIAGNOSIS — J431 Panlobular emphysema: Secondary | ICD-10-CM | POA: Diagnosis not present

## 2021-01-27 DIAGNOSIS — A419 Sepsis, unspecified organism: Secondary | ICD-10-CM | POA: Diagnosis not present

## 2021-01-27 DIAGNOSIS — R079 Chest pain, unspecified: Secondary | ICD-10-CM | POA: Insufficient documentation

## 2021-01-27 DIAGNOSIS — I5189 Other ill-defined heart diseases: Secondary | ICD-10-CM | POA: Diagnosis not present

## 2021-01-27 DIAGNOSIS — G249 Dystonia, unspecified: Secondary | ICD-10-CM | POA: Diagnosis not present

## 2021-01-27 DIAGNOSIS — I259 Chronic ischemic heart disease, unspecified: Secondary | ICD-10-CM | POA: Insufficient documentation

## 2021-01-27 DIAGNOSIS — I5021 Acute systolic (congestive) heart failure: Secondary | ICD-10-CM | POA: Diagnosis present

## 2021-01-27 LAB — COMPREHENSIVE METABOLIC PANEL
ALT: 22 U/L (ref 0–44)
AST: 16 U/L (ref 15–41)
Albumin: 2.9 g/dL — ABNORMAL LOW (ref 3.5–5.0)
Alkaline Phosphatase: 146 U/L — ABNORMAL HIGH (ref 38–126)
Anion gap: 6 (ref 5–15)
BUN: 11 mg/dL (ref 8–23)
CO2: 20 mmol/L — ABNORMAL LOW (ref 22–32)
Calcium: 8.8 mg/dL — ABNORMAL LOW (ref 8.9–10.3)
Chloride: 112 mmol/L — ABNORMAL HIGH (ref 98–111)
Creatinine, Ser: 0.69 mg/dL (ref 0.44–1.00)
GFR, Estimated: >60 mL/min (ref >60)
Glucose, Bld: 163 mg/dL — ABNORMAL HIGH (ref 70–99)
Potassium: 3.9 mmol/L (ref 3.5–5.1)
Sodium: 138 mmol/L (ref 135–145)
Total Bilirubin: 0.4 mg/dL (ref 0.3–1.2)
Total Protein: 6.1 g/dL — ABNORMAL LOW (ref 6.5–8.1)

## 2021-01-27 LAB — CBC WITH DIFFERENTIAL/PLATELET
Abs Immature Granulocytes: 0.22 10*3/uL — ABNORMAL HIGH (ref 0.00–0.07)
Basophils Absolute: 0.1 10*3/uL (ref 0.0–0.1)
Basophils Relative: 1 %
Eosinophils Absolute: 0.2 10*3/uL (ref 0.0–0.5)
Eosinophils Relative: 4 %
HCT: 40.3 % (ref 36.0–46.0)
Hemoglobin: 12.6 g/dL (ref 12.0–15.0)
Immature Granulocytes: 4 %
Lymphocytes Relative: 29 %
Lymphs Abs: 1.5 10*3/uL (ref 0.7–4.0)
MCH: 28.3 pg (ref 26.0–34.0)
MCHC: 31.3 g/dL (ref 30.0–36.0)
MCV: 90.4 fL (ref 80.0–100.0)
Monocytes Absolute: 0.5 10*3/uL (ref 0.1–1.0)
Monocytes Relative: 9 %
Neutro Abs: 2.7 10*3/uL (ref 1.7–7.7)
Neutrophils Relative %: 53 %
Platelets: 171 10*3/uL (ref 150–400)
RBC: 4.46 MIL/uL (ref 3.87–5.11)
RDW: 14.3 % (ref 11.5–15.5)
WBC: 5.1 10*3/uL (ref 4.0–10.5)
nRBC: 0 % (ref 0.0–0.2)

## 2021-01-27 LAB — NM MYOCAR MULTI W/SPECT W/WALL MOTION / EF
Estimated workload: 1
Exercise duration (min): 0 min
Exercise duration (sec): 0 s
MPHR: 144 {beats}/min
Peak HR: 111 {beats}/min
Percent HR: 77 %
Rest HR: 78 {beats}/min

## 2021-01-27 LAB — CK: Total CK: 53 U/L (ref 38–234)

## 2021-01-27 LAB — GLUCOSE, CAPILLARY
Glucose-Capillary: 136 mg/dL — ABNORMAL HIGH (ref 70–99)
Glucose-Capillary: 189 mg/dL — ABNORMAL HIGH (ref 70–99)
Glucose-Capillary: 190 mg/dL — ABNORMAL HIGH (ref 70–99)
Glucose-Capillary: 240 mg/dL — ABNORMAL HIGH (ref 70–99)

## 2021-01-27 LAB — MAGNESIUM: Magnesium: 2.3 mg/dL (ref 1.7–2.4)

## 2021-01-27 LAB — PHOSPHORUS: Phosphorus: 3 mg/dL (ref 2.5–4.6)

## 2021-01-27 MED ORDER — SULFAMETHOXAZOLE-TRIMETHOPRIM 800-160 MG PO TABS
1.0000 | ORAL_TABLET | Freq: Two times a day (BID) | ORAL | Status: DC
  Administered 2021-01-27 – 2021-01-29 (×6): 1 via ORAL
  Filled 2021-01-27 (×6): qty 1

## 2021-01-27 MED ORDER — REGADENOSON 0.4 MG/5ML IV SOLN
INTRAVENOUS | Status: AC
  Administered 2021-01-27: 13:00:00 0.4 mg via INTRAVENOUS
  Filled 2021-01-27: qty 5

## 2021-01-27 MED ORDER — REGADENOSON 0.4 MG/5ML IV SOLN: 0.4000 mg | Freq: Once | INTRAVENOUS | Status: AC

## 2021-01-27 MED ORDER — INSULIN ASPART 100 UNIT/ML IJ SOLN
5.0000 [IU] | Freq: Three times a day (TID) | INTRAMUSCULAR | Status: DC
Start: 2021-01-28 — End: 2021-01-30
  Administered 2021-01-28 – 2021-01-29 (×3): 5 [IU] via SUBCUTANEOUS

## 2021-01-27 MED ORDER — TECHNETIUM TC 99M TETROFOSMIN IV KIT
31.4000 | PACK | Freq: Once | INTRAVENOUS | Status: AC | PRN
  Administered 2021-01-27: 14:00:00 31.4 via INTRAVENOUS

## 2021-01-27 MED ORDER — TECHNETIUM TC 99M TETROFOSMIN IV KIT
10.1000 | PACK | Freq: Once | INTRAVENOUS | Status: AC | PRN
  Administered 2021-01-27: 11:00:00 10.1 via INTRAVENOUS

## 2021-01-27 MED ORDER — INSULIN ASPART 100 UNIT/ML IJ SOLN
0.0000 [IU] | INTRAMUSCULAR | Status: DC
  Administered 2021-01-27: 22:00:00 2 [IU] via SUBCUTANEOUS
  Administered 2021-01-27: 3 [IU] via SUBCUTANEOUS
  Administered 2021-01-28 – 2021-01-29 (×6): 2 [IU] via SUBCUTANEOUS
  Administered 2021-01-29: 18:00:00 3 [IU] via SUBCUTANEOUS
  Administered 2021-01-29: 15:00:00 1 [IU] via SUBCUTANEOUS
  Administered 2021-01-29: 08:00:00 2 [IU] via SUBCUTANEOUS

## 2021-01-27 NOTE — Progress Notes (Signed)
Progress Note  Patient Name: Mariah Reed Date of Encounter: 01/27/2021  [facility] Cardiologist:   Subjective   No CP; complains of cough  Inpatient Medications    Scheduled Meds:  alum & mag hydroxide-simeth  30 mL Oral BID   diclofenac  2 patch Transdermal BID   diphenoxylate-atropine  2 tablet Oral BID   heparin injection (subcutaneous)  5,000 Units Subcutaneous Q8H   insulin aspart  0-15 Units Subcutaneous TID WC   insulin aspart  3 Units Subcutaneous TID WC   insulin detemir  10 Units Subcutaneous Q24H   levothyroxine  100 mcg Oral QAC breakfast   lipase/protease/amylase  72,000 Units Oral TID with meals   mouth rinse  15 mL Mouth Rinse BID   metoprolol tartrate  12.5 mg Oral BID   NIFEdipine  30 mg Oral Daily   pantoprazole  40 mg Oral Daily   potassium chloride  40 mEq Oral Daily   pregabalin  25 mg Oral Once   pregabalin  25 mg Oral BID   tiZANidine  4 mg Oral QHS   topiramate  100 mg Oral q morning   umeclidinium bromide  1 puff Inhalation Daily   Continuous Infusions:  sodium chloride Stopped (01/24/21 2119)   cefTRIAXone (ROCEPHIN)  IV 2 g (01/26/21 1335)   PRN Meds: sodium chloride, acetaminophen **OR** acetaminophen, albuterol, benzonatate, fluticasone, guaiFENesin-dextromethorphan, hydrOXYzine, labetalol, lip balm, loperamide, LORazepam, morphine injection, nitroGLYCERIN, ondansetron **OR** ondansetron (ZOFRAN) IV, oxyCODONE, phenol, zolpidem   Vital Signs    Vitals:   01/26/21 1434 01/26/21 2030 01/27/21 0539 01/27/21 0800  BP: (!) 145/64 (!) 156/64 (!) 142/77   Pulse: 80 87 67   Resp:  18 18 (!) 34  Temp:  98.2 F (36.8 C) 98.6 F (37 C)   TempSrc:  Oral Oral   SpO2:  95% 98%   Weight:      Height:        Intake/Output Summary (Last 24 hours) at 01/27/2021 0903 Last data filed at 01/27/2021 0540 Gross per 24 hour  Intake 340 ml  Output 400 ml  Net -60 ml    Last 3 Weights 01/20/2021 01/20/2021 01/20/2021  Weight (lbs) 170  lb 175 lb 174 lb  Weight (kg) 77.111 kg 79.379 kg 78.926 kg      Telemetry    Sinus - Personally Reviewed   Physical Exam   GEN: No acute distress.   Neck: No JVD Cardiac: RRR Respiratory: Clear to auscultation bilaterally. GI: Soft, nontender, non-distended  MS: congenital LUE abnormality Neuro:  Nonfocal  Psych: Normal affect   Labs    High Sensitivity Troponin:   Recent Labs  Lab 01/25/21 1700 01/25/21 1803 01/25/21 2001  TROPONINIHS 108* 126* 99*      Chemistry Recent Labs  Lab 01/25/21 1700 01/26/21 0546 01/27/21 0513  NA 140 137 138  K 3.5 3.7 3.9  CL 109 110 112*  CO2 24 22 20*  GLUCOSE 149* 156* 163*  BUN 15 12 11   CREATININE 0.74 0.65 0.69  CALCIUM 8.3* 8.5* 8.8*  MG 2.2 2.3 2.3  PROT 6.2* 6.6 6.1*  ALBUMIN 2.9* 3.1* 2.9*  AST 22 18 16   ALT 26 24 22   ALKPHOS 187* 180* 146*  BILITOT 0.4 0.6 0.4  GFRNONAA >60 >60 >60  ANIONGAP 7 5 6      Lipids  Recent Labs  Lab 01/26/21 0546  CHOL 168  TRIG 186*  HDL 22*  LDLCALC 109*  CHOLHDL 7.6  Hematology Recent Labs  Lab 01/24/21 0450 01/26/21 0546 01/27/21 0513  WBC 8.7 5.6 5.1  RBC 4.24 4.70 4.46  HGB 11.9* 13.3 12.6  HCT 37.5 42.1 40.3  MCV 88.4 89.6 90.4  MCH 28.1 28.3 28.3  MCHC 31.7 31.6 31.3  RDW 14.4 14.2 14.3  PLT 119* 151 171    Thyroid  Recent Labs  Lab 01/26/21 0546  TSH 5.557*      Patient Profile     75 year old female with past medical history of hypertension, diabetes mellitus, hyperlipidemia, COPD, neuropathy for evaluation of chest pain.  Patient admitted with abdominal pain and rhabdomyolysis.  Also found to have obstructing calculus at right UJV and hydronephrosis of the right kidney.  She has been treated with antibiotics and had cystoscopy/stent placement on January 20, 2021.  Patient has complained of chest pain and cardiology asked to evaluate.  Echocardiogram shows ejection fraction 50 to 55%, mild left atrial enlargement.  Assessment & Plan    1  chest pain-as outlined previously symptoms very atypical.  Her electrocardiogram shows left bundle branch block but this is old.  Echocardiogram shows preserved LV function.  Patient is scheduled for a Lexiscan nuclear stress for risk stratification today.  If no ischemia no plans for further evaluation.  She has requested to be seen in [place] and follow-up and she states she has a friend who knows a very good cardiologist.  She does not want [place] to make arrangements for cardiology follow-up but she will do this on her own.     2 minimally elevated troponin-nondiagnostic of acute coronary syndrome.  Await results of nuclear study.   3 aortic atherosclerosis/coronary calcification/diabetes mellitus-patient would benefit from a statin long-term.  We will leave to primary care.   4 urosepsis/recent urological procedure-Per urology.   5 hypertension-we will continue metoprolol and Procardia.  Follow blood pressure and adjust medications as needed.  Patient can be discharged if nuclear study shows no ischemia with plans as outlined above.  Continue present medications.  For questions or updates, please contact [place] Please consult [email or web address] for contact info under        Signed, Kirk Ruths, MD  01/27/2021, 9:03 AM

## 2021-01-27 NOTE — Progress Notes (Signed)
Pt tolerated Lexiscan

## 2021-01-27 NOTE — Progress Notes (Signed)
Progress Note  Patient Name: Mariah Reed Date of Encounter: 01/27/2021  [facility] Cardiologist:   Subjective   No CP; complains of cough  Inpatient Medications    Scheduled Meds:  alum & mag hydroxide-simeth  30 mL Oral BID   diclofenac  2 patch Transdermal BID   diphenoxylate-atropine  2 tablet Oral BID   heparin injection (subcutaneous)  5,000 Units Subcutaneous Q8H   insulin aspart  0-15 Units Subcutaneous TID WC   insulin aspart  3 Units Subcutaneous TID WC   insulin detemir  10 Units Subcutaneous Q24H   levothyroxine  100 mcg Oral QAC breakfast   lipase/protease/amylase  72,000 Units Oral TID with meals   mouth rinse  15 mL Mouth Rinse BID   metoprolol tartrate  12.5 mg Oral BID   NIFEdipine  30 mg Oral Daily   pantoprazole  40 mg Oral Daily   potassium chloride  40 mEq Oral Daily   pregabalin  25 mg Oral Once   pregabalin  25 mg Oral BID   tiZANidine  4 mg Oral QHS   topiramate  100 mg Oral q morning   umeclidinium bromide  1 puff Inhalation Daily   Continuous Infusions:  sodium chloride Stopped (01/24/21 2119)   cefTRIAXone (ROCEPHIN)  IV 2 g (01/26/21 1335)   PRN Meds: sodium chloride, acetaminophen **OR** acetaminophen, albuterol, benzonatate, fluticasone, guaiFENesin-dextromethorphan, hydrOXYzine, labetalol, lip balm, loperamide, LORazepam, morphine injection, nitroGLYCERIN, ondansetron **OR** ondansetron (ZOFRAN) IV, oxyCODONE, phenol, zolpidem   Vital Signs    Vitals:   01/26/21 1434 01/26/21 2030 01/27/21 0539 01/27/21 0800  BP: (!) 145/64 (!) 156/64 (!) 142/77   Pulse: 80 87 67   Resp:  18 18 (!) 34  Temp:  98.2 F (36.8 C) 98.6 F (37 C)   TempSrc:  Oral Oral   SpO2:  95% 98%   Weight:      Height:        Intake/Output Summary (Last 24 hours) at 01/27/2021 0857 Last data filed at 01/27/2021 0540 Gross per 24 hour  Intake 340 ml  Output 400 ml  Net -60 ml   Last 3 Weights 01/20/2021 01/20/2021 01/20/2021  Weight (lbs) 170 lb  175 lb 174 lb  Weight (kg) 77.111 kg 79.379 kg 78.926 kg      Telemetry    Sinus - Personally Reviewed   Physical Exam   GEN: No acute distress.   Neck: No JVD Cardiac: RRR Respiratory: Clear to auscultation bilaterally. GI: Soft, nontender, non-distended  MS: congenital LUE abnormality Neuro:  Nonfocal  Psych: Normal affect   Labs    High Sensitivity Troponin:   Recent Labs  Lab 01/25/21 1700 01/25/21 1803 01/25/21 2001  TROPONINIHS 108* 126* 99*     Chemistry Recent Labs  Lab 01/25/21 1700 01/26/21 0546 01/27/21 0513  NA 140 137 138  K 3.5 3.7 3.9  CL 109 110 112*  CO2 24 22 20*  GLUCOSE 149* 156* 163*  BUN 15 12 11   CREATININE 0.74 0.65 0.69  CALCIUM 8.3* 8.5* 8.8*  MG 2.2 2.3 2.3  PROT 6.2* 6.6 6.1*  ALBUMIN 2.9* 3.1* 2.9*  AST 22 18 16   ALT 26 24 22   ALKPHOS 187* 180* 146*  BILITOT 0.4 0.6 0.4  GFRNONAA >60 >60 >60  ANIONGAP 7 5 6     Lipids  Recent Labs  Lab 01/26/21 0546  CHOL 168  TRIG 186*  HDL 22*  LDLCALC 109*  CHOLHDL 7.6    Hematology Recent Labs  Lab 01/24/21 0450 01/26/21 0546 01/27/21 0513  WBC 8.7 5.6 5.1  RBC 4.24 4.70 4.46  HGB 11.9* 13.3 12.6  HCT 37.5 42.1 40.3  MCV 88.4 89.6 90.4  MCH 28.1 28.3 28.3  MCHC 31.7 31.6 31.3  RDW 14.4 14.2 14.3  PLT 119* 151 171   Thyroid  Recent Labs  Lab 01/26/21 0546  TSH 5.557*     Patient Profile     75 year old female with past medical history of hypertension, diabetes mellitus, hyperlipidemia, COPD, neuropathy for evaluation of chest pain.  Patient admitted with abdominal pain and rhabdomyolysis.  Also found to have obstructing calculus at right UJV and hydronephrosis of the right kidney.  She has been treated with antibiotics and had cystoscopy/stent placement on January 20, 2021.  Patient has complained of chest pain and cardiology asked to evaluate.  Echocardiogram shows ejection fraction 50 to 55%, mild left atrial enlargement.  Assessment & Plan    1 chest  pain-as outlined previously symptoms very atypical.  Her electrocardiogram shows left bundle branch block but this is old.  Echocardiogram shows preserved LV function.  Patient is scheduled for a Lexiscan nuclear stress for risk stratification today.  If no ischemia no plans for further evaluation.  She has requested to be seen in [place] and follow-up and she states she has a friend who knows a very good cardiologist.  She does not want [place] to make arrangements for cardiology follow-up but she will do this on her own.     2 minimally elevated troponin-nondiagnostic of acute coronary syndrome.  Await results of nuclear study.   3 aortic atherosclerosis/coronary calcification/diabetes mellitus-patient would benefit from a statin long-term.  We will leave to primary care.   4 urosepsis/recent urological procedure-Per urology.   5 hypertension-we will continue metoprolol and Procardia.  Follow blood pressure and adjust medications as needed.  Patient can be discharged if nuclear study shows no ischemia with plans as outlined above.  Continue present medications.  For questions or updates, please contact [place] Please consult [email or web address] for contact info under        Signed, Kirk Ruths, MD  01/27/2021, 8:57 AM

## 2021-01-27 NOTE — Plan of Care (Signed)
°  Problem: Education: Goal: Knowledge of General Education information will improve Description: Including pain rating scale, medication(s)/side effects and non-pharmacologic comfort measures Outcome: Progressing   Problem: Clinical Measurements: Goal: Ability to maintain clinical measurements within normal limits will improve Outcome: Progressing   Problem: Clinical Measurements: Goal: Diagnostic test results will improve Outcome: Progressing   Problem: Coping: Goal: Level of anxiety will decrease Outcome: Progressing

## 2021-01-27 NOTE — Progress Notes (Signed)
Physical Therapy Treatment Patient Details Name: Mariah Reed MRN: [id number] DOB: 02-17-1945 Today's Date: 01/27/2021   History of Present Illness Mariah Reed is a 75 y.o. female with medical history significant of seasonal allergies, COPD, type II DM, gout, hyperlipidemia, hypertension, chronic lower back pain, peripheral neuropathy who is coming from [facility] ED to the emergency department due to severe sepsis in the setting of acute emphysematous pyelonephritis.    PT Comments    The patient somewhat irritable about  having a test and no food. Patient eager to return home. Patient ambulated to and from BR without any external support.   Recommendations for follow up therapy are one component of a multi-disciplinary discharge planning process, led by the attending physician.  Recommendations may be updated based on patient status, additional functional criteria and insurance authorization.  Follow Up Recommendations  Home health PT     Assistance Recommended at Discharge Intermittent Supervision/Assistance  Equipment Recommendations  None recommended by PT    Recommendations for Other Services       Precautions / Restrictions Precautions Precautions: Fall Precaution Comments: pt denies falls in past 6 months     Mobility  Bed Mobility   Bed Mobility: Supine to Sit;Sit to Supine     Supine to sit: Modified independent (Device/Increase time);HOB elevated Sit to supine: Modified independent (Device/Increase time)   General bed mobility comments: extra time    Transfers Overall transfer level: Needs assistance Equipment used: 1 person hand held assist Transfers: Sit to/from Stand Sit to Stand: Min guard           General transfer comment: no physical assist, no loss of balance    Ambulation/Gait Ambulation/Gait assistance: Min guard Gait Distance (Feet): 15 Feet (x 2) Assistive device: None Gait Pattern/deviations: Step-through pattern Gait  velocity: decr     General Gait Details: patient did not want therapist support   Stairs             Wheelchair Mobility    Modified Rankin (Stroke Patients Only)       Balance     Sitting balance-Leahy Scale: Good     Standing balance support: No upper extremity supported Standing balance-Leahy Scale: Fair                              Cognition Arousal/Alertness: Awake/alert Behavior During Therapy: WFL for tasks assessed/performed Overall Cognitive Status: Within Functional Limits for tasks assessed                                          Exercises      General Comments        Pertinent Vitals/Pain Faces Pain Scale: Hurts a little bit Pain Location: head ache Pain Intervention(s): Monitored during session    Home Living                          Prior Function            PT Goals (current goals can now be found in the care plan section) Progress towards PT goals: Progressing toward goals    Frequency    Min 3X/week      PT Plan Current plan remains appropriate    Co-evaluation  AM-PAC PT "6 Clicks" Mobility   Outcome Measure  Help needed turning from your back to your side while in a flat bed without using bedrails?: None Help needed moving from lying on your back to sitting on the side of a flat bed without using bedrails?: None Help needed moving to and from a bed to a chair (including a wheelchair)?: None Help needed standing up from a chair using your arms (e.g., wheelchair or bedside chair)?: None Help needed to walk in hospital room?: A Little Help needed climbing 3-5 steps with a railing? : A Little 6 Click Score: 22    End of Session   Activity Tolerance: Patient tolerated treatment well;No increased pain Patient left: in bed;with call bell/phone within reach;with bed alarm set Nurse Communication: Mobility status PT Visit Diagnosis: Difficulty in walking, not  elsewhere classified (R26.2)     Time: 7494-4967 PT Time Calculation (min) (ACUTE ONLY): 21 min  Charges:  $Gait Training: 8-22 mins                     [place] Pager [phone] Office [phone]    Claretha Cooper 01/27/2021, 4:26 PM

## 2021-01-27 NOTE — Progress Notes (Signed)
OT Cancellation Note  Patient Details Name: MANAAL MANDALA MRN: [id number] DOB: Feb 01, 1946   Cancelled Treatment:    Reason Eval/Treat Not Completed: Patient at procedure or test/ unavailable. Patient not in room. Gone for stress test. Will f/u as able.  Ivis Henneman L Elgie Maziarz 01/27/2021, 1:48 PM

## 2021-01-27 NOTE — Progress Notes (Signed)
° °  Mariah Reed presented for a lexiscan cardiolite today.  No immediate complications.  Stress imaging is pending at this time.  Murray Hodgkins, NP 01/27/2021, 12:48 PM

## 2021-01-27 NOTE — Progress Notes (Signed)
PROGRESS NOTE    Mariah Reed  PRF:[id number] DOB: Jul 15, 1945 DOA: 01/20/2021 PCP: Gladstone Lighter, MD   Brief Narrative:  75 y.o. WF PMHx seasonal allergies, COPD, type II,  DM uncontrolled with complication, DM peripheral neuropathy, Gout, hyperlipidemia, hypertension, chronic lower back pain,    Coming from [facility] ED to the emergency department due to severe sepsis in the setting of acute emphysematous pyelonephritis     Subjective: 12/23 afebrile overnight s/p nuclear med stress test.   Assessment & Plan:  Covid vaccination;  Principal Problem:   Acute pyonephrosis Active Problems:   Diabetes mellitus type 2 with complications ([place])   HLD (hyperlipidemia)   GOUT   Essential hypertension   COPD (chronic obstructive pulmonary disease) ([place])   Gastroesophageal reflux disease without esophagitis   Hypothyroidism   Severe sepsis (HCC)   Hydronephrosis of right kidney   Hydroureter on right   Rhabdomyolysis   Demand ischemia (HCC)   Elevated troponin   Acute systolic CHF (congestive heart failure) ([place])  Severe sepsis POA / Acute pyonephrosis with Hydronephrosis of right kidney and Hydroureter on right Status post retrogrades cystoscopy, Staghorn stone removal and stent placement. Continued on Zosyn every 8 hours. Blood cultures negative Urine cultures pos for ecoli resistant to ampicillin, proteus resistant to nitrofurantoin Urology consult and intervention appreciated. Will require PCNL to treat large 4cm staghorn stone, can be arranged as outpatient -12/22 urology recommends culture specific abx x 2 weeks.  In a.m. will speak with ID/pharmacy what is best p.o. medication to transition to in preparation for discharge -12/23 patient now on p.o. antibiotics stable for discharge once cardiology makes final recommendation on treatment modalities.   Rhabdomyolysis -Resolved    DM type II uncontrolled with complication/DM Neuropathy -12/17 hemoglobin  A1c= 10.5   - 12/22 increase Levemir 10 units daily -12/22 NovoLog 5 unitsQAC - 12/23 decrease sensitive   HLD (hyperlipidemia) Hold lovastatin as per above   GOUT -12/21 uric acid = 3.0 at goal   Acute systolic CHF - 93/57 by nuclear med stress test EF 47%, also showed some reversibility see results  - 12/23 Will await recommendations from cardiology.  Cardiac catheterization?     Essential hypertension -Procardia XL, 30mg  daily. -Metoprolol 12.5 mg BID -Given soft BP, held spironolactone  Tachycardia -New finding on 12/29. Noted to have HR into the 140's -Resolved after starting metoprolol  Chest pain - 12/21 EKG: NSR with LBBB -12/21 consulted to cardiology will evaluate patient.  Will await recommendations -See essential HTN -12/21 Heparin drip  Elevated troponin/Demand ischemia - 12/22 overnight patient's troponins elevated, but has now flattened out    Latest Reference Range & Units 01/25/21 17:00 01/25/21 18:03 01/25/21 20:01  Troponin I (High Sensitivity) <18 ng/L 108 (HH) 126 (HH) 99 (H)  -Consistent with demand ischemia secondary to her acute illness. -12/22 scheduled for nuclear med stress test in a.m. -Patient does not know cardiologist in town.  States she has a friend who may know a cardiologist in town [employer])  COPD (chronic obstructive pulmonary disease) ([place]) -Nebs as needed  Acute respiratory failure with hypoxia -New finding this 12/19 -O2 requirement up to [facility] now weaning -Given CT chest findings of effusion with concerns of fluid, pt has received IV lasix with improvovement and O2 that is weaning -12/21 improving, given patient's chest pain continue O2.  Titrate to maintain SPO2> 92%  -12/23 on room air     Gastroesophageal reflux disease without esophagitis Continue PPI.   Hypothyroidism -12/21  TSH 5.5 elevated -12/22 increase levothyroxine on 100 mcg daily.  Recheck TSH in 6 weeks        DVT prophylaxis: Heparin drip Code  Status: Full Family Communication:  Status is: Inpatient    Dispo: The patient is from: Home              Anticipated d/c is to: Home              Anticipated d/c date is: 3 days              Patient currently is not medically stable to d/c.      Consultants:  Cardiology    Procedures/Significant Events:  12/20 Echocardiogram Left Ventricle: LVEF= 50 to 55%. Abnormal (paradoxical) septal motion, consistent LBBB  12/23 NM myocardial stress test. -Apical scar favored over apical thinning. Also small region of inferior wall inducible ischemia near the apex. -Septal hypokinesis with mild septal dyskinesis. Mild anterior and inferior wall hypokinesis -LVEF= 47% - Non invasive risk stratification*: Intermediate   I have personally reviewed and interpreted all radiology studies and my findings are as above.  VENTILATOR SETTINGS:    Cultures   Antimicrobials: Anti-infectives (From admission, onward)    Start     Ordered Stop   01/27/21 1330  sulfamethoxazole-trimethoprim (BACTRIM DS) 800-160 MG per tablet 1 tablet        01/27/21 1238     01/25/21 1200  cefTRIAXone (ROCEPHIN) 2 g in sodium chloride 0.9 % 100 mL IVPB  Status:  Discontinued        01/25/21 1114 01/27/21 1238   01/20/21 1800  piperacillin-tazobactam (ZOSYN) IVPB 3.375 g  Status:  Discontinued        01/20/21 1605 01/25/21 1114   01/20/21 1045  piperacillin-tazobactam (ZOSYN) IVPB 3.375 g        01/20/21 1038 01/20/21 1558   01/20/21 0800  piperacillin-tazobactam (ZOSYN) IVPB 3.375 g        01/20/21 0759 01/20/21 0940           Devices    LINES / TUBES:      Continuous Infusions:  sodium chloride Stopped (01/24/21 2119)     Objective: Vitals:   01/27/21 0539 01/27/21 0800 01/27/21 1521 01/27/21 1639  BP: (!) 142/77  (!) 160/88   Pulse: 67  91   Resp: 18 (!) 34 20 20  Temp: 98.6 F (37 C)  98.4 F (36.9 C)   TempSrc: Oral  Oral   SpO2: 98%  97%   Weight:      Height:         Intake/Output Summary (Last 24 hours) at 01/27/2021 2014 Last data filed at 01/27/2021 0540 Gross per 24 hour  Intake --  Output 400 ml  Net -400 ml   Filed Weights   01/20/21 0600 01/20/21 1021 01/20/21 1151  Weight: 78.9 kg 79.4 kg 77.1 kg    Examination:  General: A/O x4, negative acute respiratory distress Eyes: negative scleral hemorrhage, negative anisocoria, negative icterus ENT: Negative Runny nose, negative gingival bleeding, Neck:  Negative scars, masses, torticollis, lymphadenopathy, JVD Lungs: Clear to auscultation bilaterally without wheezes or crackles Cardiovascular: Regular rate and rhythm without murmur gallop or rub normal S1 and S2, positive chest pain left lateral without radiation, negative diaphoresis Abdomen: negative abdominal pain, nondistended, positive soft, bowel sounds, no rebound, no ascites, no appreciable mass Extremities: No significant cyanosis, clubbing, or edema bilateral lower extremities Skin: Negative rashes, lesions, ulcers Psychiatric:  Negative depression, negative anxiety,  negative fatigue, negative mania  Central nervous system:  Cranial nerves II through XII intact, tongue/uvula midline, all extremities muscle strength 5/5, sensation intact throughout, negative dysarthria, negative expressive aphasia, negative receptive aphasia.  .     Data Reviewed: Care during the described time interval was provided by me .  I have reviewed this patient's available data, including medical history, events of note, physical examination, and all test results as part of my evaluation.   CBC: Recent Labs  Lab 01/22/21 0326 01/23/21 0424 01/24/21 0450 01/26/21 0546 01/27/21 0513  WBC 10.2 7.8 8.7 5.6 5.1  NEUTROABS  --   --   --  3.0 2.7  HGB 11.4* 11.6* 11.9* 13.3 12.6  HCT 37.2 37.0 37.5 42.1 40.3  MCV 91.0 89.4 88.4 89.6 90.4  PLT 86* 103* 119* 151 244   Basic Metabolic Panel: Recent Labs  Lab 01/23/21 0424 01/24/21 0450  01/25/21 0501 01/25/21 1700 01/26/21 0546 01/27/21 0513  NA 138 138 137 140 137 138  K 4.9 4.0 3.4* 3.5 3.7 3.9  CL 113* 109 105 109 110 112*  CO2 20* 22 25 24 22  20*  GLUCOSE 223* 166* 193* 149* 156* 163*  BUN 16 17 20 15 12 11   CREATININE 0.82 0.91 0.98 0.74 0.65 0.69  CALCIUM 8.0* 8.0* 8.2* 8.3* 8.5* 8.8*  MG 2.5*  --   --  2.2 2.3 2.3  PHOS  --   --   --  2.6 2.5 3.0   GFR: Estimated Creatinine Clearance: 61.4 mL/min (by C-G formula based on SCr of 0.69 mg/dL). Liver Function Tests: Recent Labs  Lab 01/24/21 0450 01/25/21 0501 01/25/21 1700 01/26/21 0546 01/27/21 0513  AST 27 22 22 18 16   ALT 29 27 26 24 22   ALKPHOS 208* 180* 187* 180* 146*  BILITOT 0.8 0.6 0.4 0.6 0.4  PROT 5.5* 5.9* 6.2* 6.6 6.1*  ALBUMIN 2.6* 2.8* 2.9* 3.1* 2.9*   No results for input(s): LIPASE, AMYLASE in the last 168 hours.  No results for input(s): AMMONIA in the last 168 hours. Coagulation Profile: No results for input(s): INR, PROTIME in the last 168 hours.  Cardiac Enzymes: Recent Labs  Lab 01/23/21 0424 01/24/21 0450 01/25/21 0501 01/26/21 0546 01/27/21 0513  CKTOTAL 564* 188 147 78 53   BNP (last 3 results) No results for input(s): PROBNP in the last 8760 hours. HbA1C: No results for input(s): HGBA1C in the last 72 hours. CBG: Recent Labs  Lab 01/26/21 1134 01/26/21 1704 01/26/21 2138 01/27/21 0750 01/27/21 1707  GLUCAP 246* 164* 197* 136* 190*   Lipid Profile: Recent Labs    01/26/21 0546  CHOL 168  HDL 22*  LDLCALC 109*  TRIG 186*  CHOLHDL 7.6   Thyroid Function Tests: Recent Labs    01/26/21 0546  TSH 5.557*   Anemia Panel: No results for input(s): VITAMINB12, FOLATE, FERRITIN, TIBC, IRON, RETICCTPCT in the last 72 hours. Urine analysis:    Component Value Date/Time   COLORURINE YELLOW 01/20/2021 1421   APPEARANCEUR HAZY (A) 01/20/2021 1421   LABSPEC 1.005 01/20/2021 1421   PHURINE 6.0 01/20/2021 1421   GLUCOSEU >=500 (A) 01/20/2021 1421    GLUCOSEU NEGATIVE 03/17/2019 1135   HGBUR LARGE (A) 01/20/2021 1421   HGBUR negative 12/15/2009 1001   BILIRUBINUR NEGATIVE 01/20/2021 1421   BILIRUBINUR Negative 07/17/2010 [place] 01/20/2021 1421   PROTEINUR NEGATIVE 01/20/2021 1421   UROBILINOGEN 0.2 03/17/2019 1135   NITRITE NEGATIVE 01/20/2021 1421   LEUKOCYTESUR MODERATE (  A) 01/20/2021 1421   Sepsis Labs: @LABRCNTIP (procalcitonin:4,lacticidven:4)  ) Recent Results (from the past 240 hour(s))  Blood Culture (routine x 2)     Status: None   Collection Time: 01/20/21  8:10 AM   Specimen: BLOOD  Result Value Ref Range Status   Specimen Description BLOOD BLOOD LEFT HAND  Final   Special Requests   Final    BOTTLES DRAWN AEROBIC AND ANAEROBIC Blood Culture results may not be optimal due to an excessive volume of blood received in culture bottles   Culture   Final    NO GROWTH 5 DAYS Performed at [facility], [place] [address], [place], [place] [address]    Report Status 01/25/2021 FINAL  Final  Resp Panel by RT-PCR (Flu A&B, Covid) Nasopharyngeal Swab     Status: None   Collection Time: 01/20/21  8:14 AM   Specimen: Nasopharyngeal Swab; Nasopharyngeal(NP) swabs in vial transport medium  Result Value Ref Range Status   SARS Coronavirus 2 by RT PCR NEGATIVE NEGATIVE Final    Comment: (NOTE) SARS-CoV-2 target nucleic acids are NOT DETECTED.  The SARS-CoV-2 RNA is generally detectable in upper respiratory specimens during the acute phase of infection. The lowest concentration of SARS-CoV-2 viral copies this assay can detect is 138 copies/mL. A negative result does not preclude SARS-Cov-2 infection and should not be used as the sole basis for treatment or other patient management decisions. A negative result may occur with  improper specimen collection/handling, submission of specimen other than nasopharyngeal swab, presence of viral mutation(s) within the areas targeted by this assay, and inadequate  number of viral copies(<138 copies/mL). A negative result must be combined with clinical observations, patient history, and epidemiological information. The expected result is Negative.  Fact Sheet for Patients:  EntrepreneurPulse.com.au  Fact Sheet for Healthcare Providers:  IncredibleEmployment.be  This test is no t yet approved or cleared by the [place] FDA and  has been authorized for detection and/or diagnosis of SARS-CoV-2 by FDA under an Emergency Use Authorization (EUA). This EUA will remain  in effect (meaning this test can be used) for the duration of the COVID-19 declaration under Section 564(b)(1) of the Act, 21 U.S.C.section 360bbb-3(b)(1), unless the authorization is terminated  or revoked sooner.       Influenza A by PCR NEGATIVE NEGATIVE Final   Influenza B by PCR NEGATIVE NEGATIVE Final    Comment: (NOTE) The Xpert Xpress SARS-CoV-2/FLU/RSV plus assay is intended as an aid in the diagnosis of influenza from Nasopharyngeal swab specimens and should not be used as a sole basis for treatment. Nasal washings and aspirates are unacceptable for Xpert Xpress SARS-CoV-2/FLU/RSV testing.  Fact Sheet for Patients: EntrepreneurPulse.com.au  Fact Sheet for Healthcare Providers: IncredibleEmployment.be  This test is not yet approved or cleared by the [place] FDA and has been authorized for detection and/or diagnosis of SARS-CoV-2 by FDA under an Emergency Use Authorization (EUA). This EUA will remain in effect (meaning this test can be used) for the duration of the COVID-19 declaration under Section 564(b)(1) of the Act, 21 U.S.C. section 360bbb-3(b)(1), unless the authorization is terminated or revoked.  Performed at [facility], [place] [address] [place], [place] [address]   Blood Culture (routine x 2)     Status: None   Collection Time: 01/20/21 10:50 AM   Specimen: BLOOD   Result Value Ref Range Status   Specimen Description   Final    BLOOD RIGHT ANTECUBITAL Performed at [facility],  [place] [address], [place], [place] [address]    Special Requests   Final    BOTTLES DRAWN AEROBIC AND ANAEROBIC Blood Culture adequate volume Performed at [place] [address], [place], [place] [address]    Culture   Final    NO GROWTH 5 DAYS Performed at [facility], [place] [address], [place], [place] [address]    Report Status 01/25/2021 FINAL  Final  Urine Culture     Status: Abnormal   Collection Time: 01/20/21  2:21 PM   Specimen: In/Out Cath Urine  Result Value Ref Range Status   Specimen Description   Final    IN/OUT CATH URINE Performed at [facility] [address] [place], [place] [address]    Special Requests   Final    NONE Performed at [facility], [place] [address], [place], [place] [address]    Culture (A)  Final    40,000 COLONIES/mL ESCHERICHIA COLI 20,000 COLONIES/mL PROTEUS MIRABILIS    Report Status 01/23/2021 FINAL  Final   Organism ID, Bacteria ESCHERICHIA COLI (A)  Final   Organism ID, Bacteria PROTEUS MIRABILIS (A)  Final      Susceptibility   Escherichia coli - MIC*    AMPICILLIN >=32 RESISTANT Resistant     CEFAZOLIN <=4 SENSITIVE Sensitive     CEFEPIME <=0.12 SENSITIVE Sensitive     CEFTRIAXONE <=0.25 SENSITIVE Sensitive     CIPROFLOXACIN <=0.25 SENSITIVE Sensitive     GENTAMICIN <=1 SENSITIVE Sensitive     IMIPENEM <=0.25 SENSITIVE Sensitive     NITROFURANTOIN <=16 SENSITIVE Sensitive     TRIMETH/SULFA <=20 SENSITIVE Sensitive     AMPICILLIN/SULBACTAM 16 INTERMEDIATE Intermediate     PIP/TAZO <=4 SENSITIVE Sensitive     * 40,000 COLONIES/mL ESCHERICHIA COLI   Proteus mirabilis - MIC*    AMPICILLIN <=2 SENSITIVE Sensitive     CEFAZOLIN 8 SENSITIVE Sensitive     CEFEPIME <=0.12 SENSITIVE Sensitive     CEFTRIAXONE <=0.25 SENSITIVE Sensitive      CIPROFLOXACIN <=0.25 SENSITIVE Sensitive     GENTAMICIN <=1 SENSITIVE Sensitive     IMIPENEM 4 SENSITIVE Sensitive     NITROFURANTOIN RESISTANT Resistant     TRIMETH/SULFA <=20 SENSITIVE Sensitive     AMPICILLIN/SULBACTAM <=2 SENSITIVE Sensitive     PIP/TAZO <=4 SENSITIVE Sensitive     * 20,000 COLONIES/mL PROTEUS MIRABILIS  MRSA Next Gen by PCR, Nasal     Status: None   Collection Time: 01/20/21  4:04 PM   Specimen: Nasal Mucosa; Nasal Swab  Result Value Ref Range Status   MRSA by PCR Next Gen NOT DETECTED NOT DETECTED Final    Comment: (NOTE) The GeneXpert MRSA Assay (FDA approved for NASAL specimens only), is one component of a comprehensive MRSA colonization surveillance program. It is not intended to diagnose MRSA infection nor to guide or monitor treatment for MRSA infections. Test performance is not FDA approved in patients less than 39 years old. Performed at [facility], [place] [address] [place], [place] [address]          Radiology Studies: NM Myocar Multi W/Spect W/Wall Motion / EF  Result Date: 01/27/2021 CLINICAL DATA:  Nonspecific chest pain EXAM: MYOCARDIAL IMAGING WITH SPECT (REST AND PHARMACOLOGIC-STRESS) GATED LEFT VENTRICULAR WALL MOTION STUDY LEFT VENTRICULAR EJECTION FRACTION TECHNIQUE: Standard myocardial SPECT imaging was performed after resting intravenous injection of 10.1 mCi Tc-24m tetrofosmin. Subsequently, intravenous infusion of Lexiscan was performed under the supervision  of the Cardiology staff. At peak effect of the drug, 31.4 mCi Tc-36m tetrofosmin was injected intravenously and standard myocardial SPECT imaging was performed. Quantitative gated imaging was also performed to evaluate left ventricular wall motion, and estimate left ventricular ejection fraction. COMPARISON:  None. FINDINGS: Perfusion: Matched substantially reduced activity at the cardiac apex favoring scar over apical thinning. Small focus of reversibility  suggesting inducible ischemia in the inferior wall near the apex. There is a substantial degree of motion artifact on the rest images which reduces signal to noise ratio. Wall Motion: Septal hypokinesis noted with a small amount of septal dyskinesis. Mild anterior and inferior wall hypokinesis. Left Ventricular Ejection Fraction: 47 % End diastolic volume 86 ml End systolic volume 46 ml IMPRESSION: 1. Apical scar favored over apical thinning. Also small region of inferior wall inducible ischemia near the apex. 2. Septal hypokinesis with mild septal dyskinesis. Mild anterior and inferior wall hypokinesis 3. Left ventricular ejection fraction 47% 4. Non invasive risk stratification*: Intermediate *2012 Appropriate Use Criteria for Coronary Revascularization Focused Update: J Am Coll Cardiol. 1610;96(0):454-098. http://[email or web address] Electronically Signed   By: Van Clines M.D.   On: 01/27/2021 14:51        Scheduled Meds:  alum & mag hydroxide-simeth  30 mL Oral BID   diclofenac  2 patch Transdermal BID   diphenoxylate-atropine  2 tablet Oral BID   heparin injection (subcutaneous)  5,000 Units Subcutaneous Q8H   insulin aspart  0-9 Units Subcutaneous Q4H   [START ON 01/28/2021] insulin aspart  5 Units Subcutaneous TID WC   insulin detemir  10 Units Subcutaneous Q24H   levothyroxine  100 mcg Oral QAC breakfast   lipase/protease/amylase  72,000 Units Oral TID with meals   mouth rinse  15 mL Mouth Rinse BID   metoprolol tartrate  12.5 mg Oral BID   NIFEdipine  30 mg Oral Daily   pantoprazole  40 mg Oral Daily   pregabalin  25 mg Oral Once   pregabalin  25 mg Oral BID   sulfamethoxazole-trimethoprim  1 tablet Oral Q12H   tiZANidine  4 mg Oral QHS   topiramate  100 mg Oral q morning   umeclidinium bromide  1 puff Inhalation Daily   Continuous Infusions:  sodium chloride Stopped (01/24/21 2119)     LOS: 7 days   The patient is critically ill with  multiple organ systems failure and requires high complexity decision making for assessment and support, frequent evaluation and titration of therapies, application of advanced monitoring technologies and extensive interpretation of multiple databases. Critical Care Time devoted to patient care services described in this note  Time spent: 40 minutes     Adaleah Forget, Geraldo Docker, MD [facility]   If 7PM-7AM, please contact night-coverage 01/27/2021, 8:14 PM

## 2021-01-28 DIAGNOSIS — N136 Pyonephrosis: Secondary | ICD-10-CM | POA: Diagnosis not present

## 2021-01-28 DIAGNOSIS — R0789 Other chest pain: Secondary | ICD-10-CM

## 2021-01-28 DIAGNOSIS — A419 Sepsis, unspecified organism: Secondary | ICD-10-CM | POA: Diagnosis not present

## 2021-01-28 DIAGNOSIS — M6282 Rhabdomyolysis: Secondary | ICD-10-CM

## 2021-01-28 DIAGNOSIS — J431 Panlobular emphysema: Secondary | ICD-10-CM | POA: Diagnosis not present

## 2021-01-28 DIAGNOSIS — R9439 Abnormal result of other cardiovascular function study: Secondary | ICD-10-CM

## 2021-01-28 DIAGNOSIS — R6520 Severe sepsis without septic shock: Secondary | ICD-10-CM

## 2021-01-28 DIAGNOSIS — I5021 Acute systolic (congestive) heart failure: Secondary | ICD-10-CM | POA: Diagnosis not present

## 2021-01-28 LAB — COMPREHENSIVE METABOLIC PANEL
ALT: 22 U/L (ref 0–44)
AST: 20 U/L (ref 15–41)
Albumin: 3.2 g/dL — ABNORMAL LOW (ref 3.5–5.0)
Alkaline Phosphatase: 149 U/L — ABNORMAL HIGH (ref 38–126)
Anion gap: 5 (ref 5–15)
BUN: 12 mg/dL (ref 8–23)
CO2: 23 mmol/L (ref 22–32)
Calcium: 8.9 mg/dL (ref 8.9–10.3)
Chloride: 112 mmol/L — ABNORMAL HIGH (ref 98–111)
Creatinine, Ser: 0.6 mg/dL (ref 0.44–1.00)
GFR, Estimated: >60 mL/min (ref >60)
Glucose, Bld: 140 mg/dL — ABNORMAL HIGH (ref 70–99)
Potassium: 3.5 mmol/L (ref 3.5–5.1)
Sodium: 140 mmol/L (ref 135–145)
Total Bilirubin: 0.6 mg/dL (ref 0.3–1.2)
Total Protein: 6.6 g/dL (ref 6.5–8.1)

## 2021-01-28 LAB — CBC WITH DIFFERENTIAL/PLATELET
Abs Immature Granulocytes: 0.25 10*3/uL — ABNORMAL HIGH (ref 0.00–0.07)
Basophils Absolute: 0.1 10*3/uL (ref 0.0–0.1)
Basophils Relative: 1 %
Eosinophils Absolute: 0.2 10*3/uL (ref 0.0–0.5)
Eosinophils Relative: 4 %
HCT: 41 % (ref 36.0–46.0)
Hemoglobin: 13.2 g/dL (ref 12.0–15.0)
Immature Granulocytes: 5 %
Lymphocytes Relative: 25 %
Lymphs Abs: 1.3 10*3/uL (ref 0.7–4.0)
MCH: 28.3 pg (ref 26.0–34.0)
MCHC: 32.2 g/dL (ref 30.0–36.0)
MCV: 87.8 fL (ref 80.0–100.0)
Monocytes Absolute: 0.4 10*3/uL (ref 0.1–1.0)
Monocytes Relative: 8 %
Neutro Abs: 3.2 10*3/uL (ref 1.7–7.7)
Neutrophils Relative %: 57 %
Platelets: 199 10*3/uL (ref 150–400)
RBC: 4.67 MIL/uL (ref 3.87–5.11)
RDW: 14.4 % (ref 11.5–15.5)
WBC: 5.4 10*3/uL (ref 4.0–10.5)
nRBC: 0 % (ref 0.0–0.2)

## 2021-01-28 LAB — GLUCOSE, CAPILLARY
Glucose-Capillary: 154 mg/dL — ABNORMAL HIGH (ref 70–99)
Glucose-Capillary: 110 mg/dL — ABNORMAL HIGH (ref 70–99)
Glucose-Capillary: 199 mg/dL — ABNORMAL HIGH (ref 70–99)
Glucose-Capillary: 174 mg/dL — ABNORMAL HIGH (ref 70–99)
Glucose-Capillary: 189 mg/dL — ABNORMAL HIGH (ref 70–99)

## 2021-01-28 LAB — MAGNESIUM: Magnesium: 2.2 mg/dL (ref 1.7–2.4)

## 2021-01-28 LAB — CK: Total CK: 45 U/L (ref 38–234)

## 2021-01-28 LAB — PHOSPHORUS: Phosphorus: 3.4 mg/dL (ref 2.5–4.6)

## 2021-01-28 MED ORDER — DIPHENHYDRAMINE HCL 25 MG PO CAPS
25.0000 mg | ORAL_CAPSULE | Freq: Four times a day (QID) | ORAL | Status: DC | PRN
  Administered 2021-01-29: 06:00:00 25 mg via ORAL
  Filled 2021-01-28 (×4): qty 1

## 2021-01-28 MED ORDER — DIPHENHYDRAMINE HCL 25 MG PO CAPS: 25.0000 mg | ORAL_CAPSULE | Freq: Four times a day (QID) | ORAL | Status: DC | PRN

## 2021-01-28 MED ORDER — MENTHOL 3 MG MT LOZG
1.0000 | LOZENGE | OROMUCOSAL | Status: DC | PRN
  Administered 2021-01-28 – 2021-01-29 (×2): 3 mg via ORAL
  Filled 2021-01-28 (×3): qty 9

## 2021-01-28 MED ORDER — FLUCONAZOLE 150 MG PO TABS
150.0000 mg | ORAL_TABLET | Freq: Once | ORAL | Status: AC
  Administered 2021-01-28: 15:00:00 150 mg via ORAL
  Filled 2021-01-28: qty 1

## 2021-01-28 NOTE — Progress Notes (Signed)
Progress Note  Patient Name: Mariah Reed Date of Encounter: 01/28/2021  Primary Cardiologist: To establish in [place]  Subjective   No chest pain or shortness of breath.  Describes a variety of symptoms, sometimes a focal discomfort in her left thorax, sometimes below the shoulder blade, not all exertional in character.  Inpatient Medications    Scheduled Meds:  alum & mag hydroxide-simeth  30 mL Oral BID   diclofenac  2 patch Transdermal BID   diphenoxylate-atropine  2 tablet Oral BID   heparin injection (subcutaneous)  5,000 Units Subcutaneous Q8H   insulin aspart  0-9 Units Subcutaneous Q4H   insulin aspart  5 Units Subcutaneous TID WC   insulin detemir  10 Units Subcutaneous Q24H   levothyroxine  100 mcg Oral QAC breakfast   lipase/protease/amylase  72,000 Units Oral TID with meals   mouth rinse  15 mL Mouth Rinse BID   metoprolol tartrate  12.5 mg Oral BID   NIFEdipine  30 mg Oral Daily   pantoprazole  40 mg Oral Daily   pregabalin  25 mg Oral Once   pregabalin  25 mg Oral BID   sulfamethoxazole-trimethoprim  1 tablet Oral Q12H   tiZANidine  4 mg Oral QHS   topiramate  100 mg Oral q morning   umeclidinium bromide  1 puff Inhalation Daily   Continuous Infusions:  sodium chloride Stopped (01/24/21 2119)   PRN Meds: sodium chloride, acetaminophen **OR** acetaminophen, albuterol, benzonatate, fluticasone, guaiFENesin-dextromethorphan, hydrOXYzine, labetalol, lip balm, loperamide, LORazepam, morphine injection, nitroGLYCERIN, ondansetron **OR** ondansetron (ZOFRAN) IV, oxyCODONE, phenol, zolpidem   Vital Signs    Vitals:   01/27/21 1521 01/27/21 1639 01/27/21 2026 01/28/21 0541  BP: (!) 160/88  (!) 161/84 (!) 148/76  Pulse: 91  78 74  Resp: 20 20 19 18   Temp: 98.4 F (36.9 C)  97.6 F (36.4 C) 98.2 F (36.8 C)  TempSrc: Oral  Oral Oral  SpO2: 97%  96% 96%  Weight:      Height:        Intake/Output Summary (Last 24 hours) at 01/28/2021 0958 Last  data filed at 01/27/2021 2333 Gross per 24 hour  Intake 120 ml  Output --  Net 120 ml   Filed Weights   01/20/21 0600 01/20/21 1021 01/20/21 1151  Weight: 78.9 kg 79.4 kg 77.1 kg    Telemetry    Sinus rhythm.  Personally reviewed.  ECG    An ECG dated 01/26/2021 was personally reviewed today and demonstrated:  Sinus rhythm with left bundle branch block.  Physical Exam   GEN: No acute distress.   Neck: No JVD. Cardiac: RRR, 1/6 systolic murmur, no gallop.  Respiratory: Nonlabored. Clear to auscultation bilaterally. GI: Soft, nontender, bowel sounds present. MS: No edema.  Labs    Chemistry Recent Labs  Lab 01/26/21 0546 01/27/21 0513 01/28/21 0502  NA 137 138 140  K 3.7 3.9 3.5  CL 110 112* 112*  CO2 22 20* 23  GLUCOSE 156* 163* 140*  BUN 12 11 12   CREATININE 0.65 0.69 0.60  CALCIUM 8.5* 8.8* 8.9  PROT 6.6 6.1* 6.6  ALBUMIN 3.1* 2.9* 3.2*  AST 18 16 20   ALT 24 22 22   ALKPHOS 180* 146* 149*  BILITOT 0.6 0.4 0.6  GFRNONAA >60 >60 >60  ANIONGAP 5 6 5      Hematology Recent Labs  Lab 01/26/21 0546 01/27/21 0513 01/28/21 0502  WBC 5.6 5.1 5.4  RBC 4.70 4.46 4.67  HGB 13.3 12.6 13.2  HCT 42.1 40.3 41.0  MCV 89.6 90.4 87.8  MCH 28.3 28.3 28.3  MCHC 31.6 31.3 32.2  RDW 14.2 14.3 14.4  PLT 151 171 199    Cardiac Enzymes Recent Labs  Lab 01/25/21 1700 01/25/21 1803 01/25/21 2001  TROPONINIHS 108* 126* 99*    Radiology    NM Myocar Multi W/Spect W/Wall Motion / EF  Result Date: 01/27/2021 CLINICAL DATA:  Nonspecific chest pain EXAM: MYOCARDIAL IMAGING WITH SPECT (REST AND PHARMACOLOGIC-STRESS) GATED LEFT VENTRICULAR WALL MOTION STUDY LEFT VENTRICULAR EJECTION FRACTION TECHNIQUE: Standard myocardial SPECT imaging was performed after resting intravenous injection of 10.1 mCi Tc-97m tetrofosmin. Subsequently, intravenous infusion of Lexiscan was performed under the supervision of the Cardiology staff. At peak effect of the drug, 31.4 mCi Tc-69m  tetrofosmin was injected intravenously and standard myocardial SPECT imaging was performed. Quantitative gated imaging was also performed to evaluate left ventricular wall motion, and estimate left ventricular ejection fraction. COMPARISON:  None. FINDINGS: Perfusion: Matched substantially reduced activity at the cardiac apex favoring scar over apical thinning. Small focus of reversibility suggesting inducible ischemia in the inferior wall near the apex. There is a substantial degree of motion artifact on the rest images which reduces signal to noise ratio. Wall Motion: Septal hypokinesis noted with a small amount of septal dyskinesis. Mild anterior and inferior wall hypokinesis. Left Ventricular Ejection Fraction: 47 % End diastolic volume 86 ml End systolic volume 46 ml IMPRESSION: 1. Apical scar favored over apical thinning. Also small region of inferior wall inducible ischemia near the apex. 2. Septal hypokinesis with mild septal dyskinesis. Mild anterior and inferior wall hypokinesis 3. Left ventricular ejection fraction 47% 4. Non invasive risk stratification*: Intermediate *2012 Appropriate Use Criteria for Coronary Revascularization Focused Update: J Am Coll Cardiol. 7654;65(0):354-656. [email or web address] Electronically Signed   By: Van Clines M.D.   On: 01/27/2021 14:51    Cardiac Studies   Echocardiogram 01/24/2021:  1. Left ventricular ejection fraction, by estimation, is 50 to 55%. The  left ventricle has low normal function. The left ventricle has no regional  wall motion abnormalities. Left ventricular diastolic parameters were  normal.   2. Right ventricular systolic function is normal. The right ventricular  size is normal.   3. Left atrial size was mildly dilated.   4. The mitral valve is grossly normal. Trivial mitral valve  regurgitation. No evidence of mitral stenosis.   5. The aortic valve is grossly normal. Aortic valve  regurgitation is not  visualized. No aortic stenosis is present.   6. The inferior vena cava is dilated in size with >50% respiratory  variability, suggesting right atrial pressure of 8 mmHg.   Assessment & Plan    1.  History of chest pain with typical and atypical features.  High-sensitivity troponin I levels are not diagnostic for ACS.  ECG shows old left bundle branch block.  Work-up included echocardiogram showing LVEF 50 to 55% and subsequent Myoview showing possible apical scar and mild degree of inferoapical ischemia with LVEF 47%.  She has not been on medical therapy for ischemic heart disease.  2.  Essential hypertension, currently on nifedipine and Lopressor.  3.  Mixed hyperlipidemia, had been on Mevacor as an outpatient.  LDL 109.  4.  Uncontrolled type 2 diabetes mellitus, hemoglobin A1c 10.5%.  Will is on Glucotrol and Jardiance as an outpatient.  5.  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  We discussed the results of her Myoview, also documented coronary atherosclerosis by  CT imaging.  I would recommend medical therapy and outpatient cardiology follow-up prior to considering cardiac catheterization.  She tells me that she cannot take aspirin due to significant stomach upset.  PCI would obviously require dual antiplatelet therapy, so I would try and optimize medical therapy before considering any revascularization.  Suggest switching from Mevacor to Crestor 20 mg daily.  Increase Lopressor to 25 mg twice daily, can continue Procardia XL for now but she would also be reasonable candidate for ARB type 2 diabetes mellitus instead.  Can continue SGLT2 inhibitor, but needs to get overall glucose control further optimized with PCP.  Addition of Imdur can be a next step follow-up depending on how she is doing.  She would like to establish with Dr. Garen Lah in our [facility] practice, mentioned him by name.  We will get this  set up.  No further inpatient cardiac testing planned at this time.  Signed, Rozann Lesches, MD  01/28/2021, 9:58 AM

## 2021-01-28 NOTE — Progress Notes (Signed)
PROGRESS NOTE    Mariah Reed  KDT:[id number] DOB: Jun 13, 1945 DOA: 01/20/2021 PCP: Gladstone Lighter, MD   Brief Narrative:  The patient is a 75 year old Caucasian female with a past medical history significant for but limited to seasonal allergies, COPD type 2 diabetes mellitus which is uncontrolled without complication, diabetic peripheral neuropathy, gout, hyperlipidemia, hypertension as well as chronic low back pain who presented from the [facility] ED department to [facility] due to severe sepsis in the setting of acute emphysematous pyelonephritis.  She is status post retrograde cystoscopy and staghorn stone removal and stent placement.  She was placed on IV Zosyn every 8 hours and blood cultures been negative.  Urine cultures were positive for E. coli that was resistant to ampicillin and Proteus was resistant to nitrofurantoin.  Urology recommended that she will require a PCNL to treat the large 4 cm staghorn stone that can be arranged in outpatient setting.  They recommend at least culture specific antibiotics for 2 weeks.  She subsequently also underwent rhabdomyolysis which is improved.  After she was given fluid hydration she went into volume overload and had chest pain.  Cardiology was consulted.  Cardiology did a nuclear medicine stress test which showed an EF of 47% and showed some reversibility.  Cardiology now recommends medical management currently with outpatient catheterization.  She is an uncontrolled diabetic with a hemoglobin A1c of 10.5 and remains on Levemir.  Now she is complaining about vaginal discomfort requesting Diflucan as well as sugar-free cough drops.  Assessment & Plan:   Principal Problem:   Acute pyonephrosis Active Problems:   Diabetes mellitus type 2 with complications (HCC)   HLD (hyperlipidemia)   GOUT   Essential hypertension   COPD (chronic obstructive pulmonary disease) (HCC)   Gastroesophageal reflux disease without esophagitis   Hypothyroidism    Severe sepsis (HCC)   Hydronephrosis of right kidney   Hydroureter on right   Rhabdomyolysis   Demand ischemia (HCC)   Elevated troponin   Acute systolic CHF (congestive heart failure) (HCC)  Severe sepsis POA / Acute pyonephrosis with Hydronephrosis of right kidney and Hydroureter on right -Status post retrogrades cystoscopy, -Staghorn stone removal and stent placement. -Continued on Zosyn every 8 hours. -Blood cultures negative -Urine cultures pos for ecoli resistant to ampicillin, proteus resistant to nitrofurantoin -Urology consult and intervention appreciated. Will require PCNL to treat large 4cm staghorn stone, can be arranged as outpatient -12/22 urology recommends culture specific abx x 2 weeks.  In a.m. will speak with ID/pharmacy what is best p.o. medication to transition to in preparation for discharge -12/23 patient now on p.o. antibiotics stable for discharge once cardiology makes final recommendation on treatment modalities as below -PT OT recommending home health   Rhabdomyolysis -Resolved   DM type II uncontrolled with complication/DM Neuropathy -12/17 hemoglobin A1c= 10.5   - 12/22 increase Levemir 10 units daily -12/22 NovoLog 5 unitsQAC - 12/23 decrease sensitive  -CBGs ranging from 110-199   HLD (hyperlipidemia) Hold lovastatin as per above given her rhabdomyolysis but now cardiology suggesting switching from Mevacor to Crestor 20 mg p.o. daily   GOUT -12/21 uric acid = 3.0 at goal    Acute systolic CHF - 99/83 by nuclear med stress test EF 47%, also showed some reversibility see results  - Cardiology consulted and EF showed 50 to 55% on echo -Cardiology evaluated and discussed the results of her Myoview as well as also documented coronary atherosclerosis by CT imaging.  They are recommending medical therapy and outpatient  cardiac follow-up with consideration for cardiac catheterization at that time -Per cardiology discussion she cannot take aspirin due to  significant upset stomach but PCI would result and dual antiplatelet therapy so they are recommending optimizing medical therapy before considering any revascularization and recommending switching Mevacor to Crestor and increasing Lopressor at 5 mg p.o. twice daily and continue Procardia for now -As an outpatient she could be a reasonable candidate for ARB.  They are also recommending an SGLT2 inhibitor possibly and addition of Imdur and organ to recommend establishing with Dr. Garen Lah -Recommend no further cardiac tests planned   Essential hypertension -Procardia XL, 30mg  daily. -Metoprolol 12.5 mg BID increased to 25 mg p.o. twice daily -Given soft BP, held spironolactone -See above cardiology following   Tachycardia -New finding on 12/29. Noted to have HR into the 140's -Resolved after starting metoprolol and cardiology has increased the metoprolol   Chest pain - 12/21 EKG: NSR with LBBB -12/21 consulted to cardiology will evaluate patient.  Will await recommendations -See essential HTN -12/21 Heparin drip now stopped and she has been treated medically   Elevated troponin/Demand ischemia - 12/22 overnight patient's troponins elevated, but has now flattened out -See above   COPD (chronic obstructive pulmonary disease) ([place]) -Nebs as needed -Continue to monitor respiratory status carefully   Acute respiratory failure with hypoxia -New finding this 12/19 -O2 requirement up to [facility] now weaning -Given CT chest findings of effusion with concerns of fluid, pt has received IV lasix with improvovement and O2 that is weaning -12/21 improving, given patient's chest pain continue O2.  Titrate to maintain SPO2> 92%  -12/23 on room air -SpO2: 97 % O2 Flow Rate (L/min): 2 L/min -Elevated ambulatory home O2 screen prior to discharge and repeat chest x-ray in the morning     Gastroesophageal reflux disease without esophagitis Continue PPI.   Hypothyroidism -12/21 TSH 5.5  elevated -12/22 increase levothyroxine on 100 mcg daily.  Recheck TSH in 6 weeks   DVT prophylaxis: Heparin 5000 units, SCDs Code Status: Full code Family Communication: No family currently at bedside Disposition Plan: Pending further clinical improvement  Status is: Inpatient  Remains inpatient appropriate because: We will need further cardiac evaluation and anticipating discharge in the next 24 to 48 hours  Consultants:  Urology Cardiology  Procedures:  Echocardiogram  Myoview  Urological intervention  Antimicrobials:  Anti-infectives (From admission, onward)    Start     Dose/Rate Route Frequency Ordered Stop   01/28/21 1200  fluconazole (DIFLUCAN) tablet 150 mg        150 mg Oral  Once 01/28/21 1108 01/28/21 1434   01/27/21 1330  sulfamethoxazole-trimethoprim (BACTRIM DS) 800-160 MG per tablet 1 tablet        1 tablet Oral Every 12 hours 01/27/21 1238     01/25/21 1200  cefTRIAXone (ROCEPHIN) 2 g in sodium chloride 0.9 % 100 mL IVPB  Status:  Discontinued        2 g 200 mL/hr over 30 Minutes Intravenous Every 24 hours 01/25/21 1114 01/27/21 1238   01/20/21 1800  piperacillin-tazobactam (ZOSYN) IVPB 3.375 g  Status:  Discontinued        3.375 g 12.5 mL/hr over 240 Minutes Intravenous Every 8 hours 01/20/21 1605 01/25/21 1114   01/20/21 1045  piperacillin-tazobactam (ZOSYN) IVPB 3.375 g        3.375 g 12.5 mL/hr over 240 Minutes Intravenous NOW 01/20/21 1038 01/20/21 1558   01/20/21 0800  piperacillin-tazobactam (ZOSYN) IVPB 3.375 g  3.375 g 12.5 mL/hr over 240 Minutes Intravenous Once 01/20/21 0759 01/20/21 0940        Subjective: Seen and examined and was complaining of some vaginal discomfort and states that she had a yeast infection.  No nausea or vomiting.  Denies any chest pain or shortness of breath now.  Feels okay.  No other concerns or complaints this time.  Objective: Vitals:   01/27/21 2026 01/28/21 0541 01/28/21 1203 01/28/21 2031  BP: (!)  161/84 (!) 148/76 (!) 154/58 (!) 142/90  Pulse: 78 74 79 80  Resp: 19 18 19 16   Temp: 97.6 F (36.4 C) 98.2 F (36.8 C) 98.7 F (37.1 C) 99.1 F (37.3 C)  TempSrc: Oral Oral Oral Oral  SpO2: 96% 96% 96% 97%  Weight:      Height:        Intake/Output Summary (Last 24 hours) at 01/28/2021 2137 Last data filed at 01/28/2021 1900 Gross per 24 hour  Intake 680 ml  Output 1200 ml  Net -520 ml   Filed Weights   01/20/21 0600 01/20/21 1021 01/20/21 1151  Weight: 78.9 kg 79.4 kg 77.1 kg   Examination: Physical Exam:  Constitutional: WN/WD chronically ill-appearing Caucasian female with no acute distress Eyes: Lids normal sclerae anicteric ENMT: External Ears, Nose appear normal. Grossly normal hearing. Mucous membranes are moist.  Neck: Appears normal, supple, no cervical masses, normal ROM, no appreciable thyromegaly; no appreciable JVD Respiratory: Diminished to auscultation bilaterally with coarse breath sounds, no wheezing, rales, rhonchi or crackles. Normal respiratory effort and patient is not tachypenic. No accessory muscle use.  Unlabored breathing Cardiovascular: RRR, no murmurs / rubs / gallops. S1 and S2 auscultated.  Trace lower extremity edema Abdomen: Soft, non-tender, non-distended. Bowel sounds positive.  GU: Deferred. Musculoskeletal: No clubbing / cyanosis of digits/nails.  Has a left arm and hand deformity and atrophy in the setting of fall as a kid Skin: No rashes, lesions, ulcers. No induration; Warm and dry.  Neurologic: CN 2-12 grossly intact with no focal deficits.  Romberg and sign cerebellar reflexes not assessed.  Psychiatric: Normal judgment and insight. Alert and oriented x 3.  Anxious mood and appropriate affect.   Data Reviewed: I have personally reviewed following labs and imaging studies  CBC: Recent Labs  Lab 01/23/21 0424 01/24/21 0450 01/26/21 0546 01/27/21 0513 01/28/21 0502  WBC 7.8 8.7 5.6 5.1 5.4  NEUTROABS  --   --  3.0 2.7 3.2  HGB  11.6* 11.9* 13.3 12.6 13.2  HCT 37.0 37.5 42.1 40.3 41.0  MCV 89.4 88.4 89.6 90.4 87.8  PLT 103* 119* 151 171 563   Basic Metabolic Panel: Recent Labs  Lab 01/23/21 0424 01/24/21 0450 01/25/21 0501 01/25/21 1700 01/26/21 0546 01/27/21 0513 01/28/21 0502  NA 138   < > 137 140 137 138 140  K 4.9   < > 3.4* 3.5 3.7 3.9 3.5  CL 113*   < > 105 109 110 112* 112*  CO2 20*   < > 25 24 22  20* 23  GLUCOSE 223*   < > 193* 149* 156* 163* 140*  BUN 16   < > 20 15 12 11 12   CREATININE 0.82   < > 0.98 0.74 0.65 0.69 0.60  CALCIUM 8.0*   < > 8.2* 8.3* 8.5* 8.8* 8.9  MG 2.5*  --   --  2.2 2.3 2.3 2.2  PHOS  --   --   --  2.6 2.5 3.0 3.4   < > =  values in this interval not displayed.   GFR: Estimated Creatinine Clearance: 61.4 mL/min (by C-G formula based on SCr of 0.6 mg/dL). Liver Function Tests: Recent Labs  Lab 01/25/21 0501 01/25/21 1700 01/26/21 0546 01/27/21 0513 01/28/21 0502  AST 22 22 18 16 20   ALT 27 26 24 22 22   ALKPHOS 180* 187* 180* 146* 149*  BILITOT 0.6 0.4 0.6 0.4 0.6  PROT 5.9* 6.2* 6.6 6.1* 6.6  ALBUMIN 2.8* 2.9* 3.1* 2.9* 3.2*   No results for input(s): LIPASE, AMYLASE in the last 168 hours. No results for input(s): AMMONIA in the last 168 hours. Coagulation Profile: No results for input(s): INR, PROTIME in the last 168 hours. Cardiac Enzymes: Recent Labs  Lab 01/24/21 0450 01/25/21 0501 01/26/21 0546 01/27/21 0513 01/28/21 0502  CKTOTAL 188 147 78 53 45   BNP (last 3 results) No results for input(s): PROBNP in the last 8760 hours. HbA1C: No results for input(s): HGBA1C in the last 72 hours. CBG: Recent Labs  Lab 01/28/21 0423 01/28/21 0726 01/28/21 1200 01/28/21 1609 01/28/21 2035  GLUCAP 154* 110* 199* 174* 189*   Lipid Profile: Recent Labs    01/26/21 0546  CHOL 168  HDL 22*  LDLCALC 109*  TRIG 186*  CHOLHDL 7.6   Thyroid Function Tests: Recent Labs    01/26/21 0546  TSH 5.557*   Anemia Panel: No results for input(s):  VITAMINB12, FOLATE, FERRITIN, TIBC, IRON, RETICCTPCT in the last 72 hours. Sepsis Labs: No results for input(s): PROCALCITON, LATICACIDVEN in the last 168 hours.  Recent Results (from the past 240 hour(s))  Blood Culture (routine x 2)     Status: None   Collection Time: 01/20/21  8:10 AM   Specimen: BLOOD  Result Value Ref Range Status   Specimen Description BLOOD BLOOD LEFT HAND  Final   Special Requests   Final    BOTTLES DRAWN AEROBIC AND ANAEROBIC Blood Culture results may not be optimal due to an excessive volume of blood received in culture bottles   Culture   Final    NO GROWTH 5 DAYS Performed at [facility], [place] [address], [place], [place] [address]    Report Status 01/25/2021 FINAL  Final  Resp Panel by RT-PCR (Flu A&B, Covid) Nasopharyngeal Swab     Status: None   Collection Time: 01/20/21  8:14 AM   Specimen: Nasopharyngeal Swab; Nasopharyngeal(NP) swabs in vial transport medium  Result Value Ref Range Status   SARS Coronavirus 2 by RT PCR NEGATIVE NEGATIVE Final    Comment: (NOTE) SARS-CoV-2 target nucleic acids are NOT DETECTED.  The SARS-CoV-2 RNA is generally detectable in upper respiratory specimens during the acute phase of infection. The lowest concentration of SARS-CoV-2 viral copies this assay can detect is 138 copies/mL. A negative result does not preclude SARS-Cov-2 infection and should not be used as the sole basis for treatment or other patient management decisions. A negative result may occur with  improper specimen collection/handling, submission of specimen other than nasopharyngeal swab, presence of viral mutation(s) within the areas targeted by this assay, and inadequate number of viral copies(<138 copies/mL). A negative result must be combined with clinical observations, patient history, and epidemiological information. The expected result is Negative.  Fact Sheet for Patients:  EntrepreneurPulse.com.au  Fact Sheet for  Healthcare Providers:  IncredibleEmployment.be  This test is no t yet approved or cleared by the [place] FDA and  has been authorized for detection and/or diagnosis of SARS-CoV-2 by FDA under an Emergency  Use Authorization (EUA). This EUA will remain  in effect (meaning this test can be used) for the duration of the COVID-19 declaration under Section 564(b)(1) of the Act, 21 U.S.C.section 360bbb-3(b)(1), unless the authorization is terminated  or revoked sooner.       Influenza A by PCR NEGATIVE NEGATIVE Final   Influenza B by PCR NEGATIVE NEGATIVE Final    Comment: (NOTE) The Xpert Xpress SARS-CoV-2/FLU/RSV plus assay is intended as an aid in the diagnosis of influenza from Nasopharyngeal swab specimens and should not be used as a sole basis for treatment. Nasal washings and aspirates are unacceptable for Xpert Xpress SARS-CoV-2/FLU/RSV testing.  Fact Sheet for Patients: EntrepreneurPulse.com.au  Fact Sheet for Healthcare Providers: IncredibleEmployment.be  This test is not yet approved or cleared by the [place] FDA and has been authorized for detection and/or diagnosis of SARS-CoV-2 by FDA under an Emergency Use Authorization (EUA). This EUA will remain in effect (meaning this test can be used) for the duration of the COVID-19 declaration under Section 564(b)(1) of the Act, 21 U.S.C. section 360bbb-3(b)(1), unless the authorization is terminated or revoked.  Performed at [facility], [place] [address], [place], [place] [address]   Blood Culture (routine x 2)     Status: None   Collection Time: 01/20/21 10:50 AM   Specimen: BLOOD  Result Value Ref Range Status   Specimen Description   Final    BLOOD RIGHT ANTECUBITAL Performed at [place] [address], [place], [place] [address]    Special Requests   Final    BOTTLES DRAWN AEROBIC AND ANAEROBIC Blood Culture adequate  volume Performed at [place] [address] [place], [place] [address]    Culture   Final    NO GROWTH 5 DAYS Performed at [facility], [place] [address], [place], [place] [address]    Report Status 01/25/2021 FINAL  Final  Urine Culture     Status: Abnormal   Collection Time: 01/20/21  2:21 PM   Specimen: In/Out Cath Urine  Result Value Ref Range Status   Specimen Description   Final    IN/OUT CATH URINE Performed at [place] [address], [place], [place] [address]    Special Requests   Final    NONE Performed at [facility], [place] [address], [place], [place] [address]    Culture (A)  Final    40,000 COLONIES/mL ESCHERICHIA COLI 20,000 COLONIES/mL PROTEUS MIRABILIS    Report Status 01/23/2021 FINAL  Final   Organism ID, Bacteria ESCHERICHIA COLI (A)  Final   Organism ID, Bacteria PROTEUS MIRABILIS (A)  Final      Susceptibility   Escherichia coli - MIC*    AMPICILLIN >=32 RESISTANT Resistant     CEFAZOLIN <=4 SENSITIVE Sensitive     CEFEPIME <=0.12 SENSITIVE Sensitive     CEFTRIAXONE <=0.25 SENSITIVE Sensitive     CIPROFLOXACIN <=0.25 SENSITIVE Sensitive     GENTAMICIN <=1 SENSITIVE Sensitive     IMIPENEM <=0.25 SENSITIVE Sensitive     NITROFURANTOIN <=16 SENSITIVE Sensitive     TRIMETH/SULFA <=20 SENSITIVE Sensitive     AMPICILLIN/SULBACTAM 16 INTERMEDIATE Intermediate     PIP/TAZO <=4 SENSITIVE Sensitive     * 40,000 COLONIES/mL ESCHERICHIA COLI   Proteus mirabilis - MIC*    AMPICILLIN <=2 SENSITIVE Sensitive     CEFAZOLIN 8 SENSITIVE Sensitive     CEFEPIME <=0.12 SENSITIVE Sensitive     CEFTRIAXONE <=  0.25 SENSITIVE Sensitive     CIPROFLOXACIN <=0.25 SENSITIVE Sensitive     GENTAMICIN <=1 SENSITIVE Sensitive     IMIPENEM 4 SENSITIVE Sensitive     NITROFURANTOIN RESISTANT Resistant     TRIMETH/SULFA <=20 SENSITIVE Sensitive     AMPICILLIN/SULBACTAM <=2 SENSITIVE Sensitive     PIP/TAZO <=4  SENSITIVE Sensitive     * 20,000 COLONIES/mL PROTEUS MIRABILIS  MRSA Next Gen by PCR, Nasal     Status: None   Collection Time: 01/20/21  4:04 PM   Specimen: Nasal Mucosa; Nasal Swab  Result Value Ref Range Status   MRSA by PCR Next Gen NOT DETECTED NOT DETECTED Final    Comment: (NOTE) The GeneXpert MRSA Assay (FDA approved for NASAL specimens only), is one component of a comprehensive MRSA colonization surveillance program. It is not intended to diagnose MRSA infection nor to guide or monitor treatment for MRSA infections. Test performance is not FDA approved in patients less than 44 years old. Performed at [facility], [place] [address], [place], [place] [address]     RN Pressure Injury Documentation:     Estimated body mass index is 28.29 kg/m as calculated from the following:   Height as of this encounter: 5\' 5"  (1.651 m).   Weight as of this encounter: 77.1 kg.  Malnutrition Type:   Malnutrition Characteristics:   Nutrition Interventions:   Radiology Studies: NM Myocar Multi W/Spect W/Wall Motion / EF  Result Date: 01/27/2021 CLINICAL DATA:  Nonspecific chest pain EXAM: MYOCARDIAL IMAGING WITH SPECT (REST AND PHARMACOLOGIC-STRESS) GATED LEFT VENTRICULAR WALL MOTION STUDY LEFT VENTRICULAR EJECTION FRACTION TECHNIQUE: Standard myocardial SPECT imaging was performed after resting intravenous injection of 10.1 mCi Tc-39m tetrofosmin. Subsequently, intravenous infusion of Lexiscan was performed under the supervision of the Cardiology staff. At peak effect of the drug, 31.4 mCi Tc-68m tetrofosmin was injected intravenously and standard myocardial SPECT imaging was performed. Quantitative gated imaging was also performed to evaluate left ventricular wall motion, and estimate left ventricular ejection fraction. COMPARISON:  None. FINDINGS: Perfusion: Matched substantially reduced activity at the cardiac apex favoring scar over apical thinning. Small focus of  reversibility suggesting inducible ischemia in the inferior wall near the apex. There is a substantial degree of motion artifact on the rest images which reduces signal to noise ratio. Wall Motion: Septal hypokinesis noted with a small amount of septal dyskinesis. Mild anterior and inferior wall hypokinesis. Left Ventricular Ejection Fraction: 47 % End diastolic volume 86 ml End systolic volume 46 ml IMPRESSION: 1. Apical scar favored over apical thinning. Also small region of inferior wall inducible ischemia near the apex. 2. Septal hypokinesis with mild septal dyskinesis. Mild anterior and inferior wall hypokinesis 3. Left ventricular ejection fraction 47% 4. Non invasive risk stratification*: Intermediate *2012 Appropriate Use Criteria for Coronary Revascularization Focused Update: J Am Coll Cardiol. 8416;60(6):301-601. [email or web address] Electronically Signed   By: Van Clines M.D.   On: 01/27/2021 14:51    Scheduled Meds:  alum & mag hydroxide-simeth  30 mL Oral BID   diclofenac  2 patch Transdermal BID   diphenoxylate-atropine  2 tablet Oral BID   heparin injection (subcutaneous)  5,000 Units Subcutaneous Q8H   insulin aspart  0-9 Units Subcutaneous Q4H   insulin aspart  5 Units Subcutaneous TID WC   insulin detemir  10 Units Subcutaneous Q24H   levothyroxine  100 mcg Oral QAC breakfast   lipase/protease/amylase  72,000 Units Oral TID with meals   mouth rinse  15 mL Mouth Rinse  BID   metoprolol tartrate  12.5 mg Oral BID   NIFEdipine  30 mg Oral Daily   pantoprazole  40 mg Oral Daily   pregabalin  25 mg Oral Once   pregabalin  25 mg Oral BID   sulfamethoxazole-trimethoprim  1 tablet Oral Q12H   tiZANidine  4 mg Oral QHS   topiramate  100 mg Oral q morning   umeclidinium bromide  1 puff Inhalation Daily   Continuous Infusions:  sodium chloride Stopped (01/24/21 2119)    LOS: 8 days   Kerney Elbe, DO [facility] PAGER is  on AMION  If 7PM-7AM, please contact night-coverage [email or web address]

## 2021-01-28 NOTE — Progress Notes (Signed)
Occupational Therapy Treatment Patient Details Name: ORTHA METTS MRN: [id number] DOB: Jan 08, 1946 Today's Date: 01/28/2021   History of present illness Mariah Reed is a 75 y.o. female with medical history significant of seasonal allergies, COPD, type II DM, gout, hyperlipidemia, hypertension, chronic lower back pain, peripheral neuropathy who is coming from [facility] ED to the emergency department due to severe sepsis in the setting of acute emphysematous pyelonephritis.   OT comments  Patient was educated on safety awareness in room. Patient verbalized understanding. Patient was min guard for bed mobility, toileting tasks, functional, mobility in room, and oral care standing at sink. Patient would continue to benefit from skilled OT services at this time while admitted and after d/c to address noted deficits in order to improve overall safety and independence in ADLs.     Recommendations for follow up therapy are one component of a multi-disciplinary discharge planning process, led by the attending physician.  Recommendations may be updated based on patient status, additional functional criteria and insurance authorization.    Follow Up Recommendations  Home health OT    Assistance Recommended at Discharge Intermittent Supervision/Assistance  Equipment Recommendations  None recommended by OT    Recommendations for Other Services      Precautions / Restrictions Precautions Precautions: Fall Precaution Comments: pt denies falls in past 6 months Restrictions Weight Bearing Restrictions: No       Mobility Bed Mobility                    Transfers                         Balance                                           ADL either performed or assessed with clinical judgement   ADL Overall ADL's : Needs assistance/impaired     Grooming: Wash/dry hands;Wash/dry face;Sitting;Set up Grooming Details (indicate cue type and  reason): patient was set up standing to wash hands and face with patient reproting she has her own compensatory strategies for not using LUE.   Upper Body Bathing Details (indicate cue type and reason): patient declined to wash up on this date stating she had done it already before.             Toilet Transfer: Min guard;Ambulation;Regular Glass blower/designer Details (indicate cue type and reason): with education for safety. patient reported having taken herself to the bathroom all morning. patient was educated on falls risk. Toileting- Clothing Manipulation and Hygiene: Set up;Sit to/from stand       Functional mobility during ADLs: Min guard General ADL Comments: patient was able to participate in functional mobility to increase activity tolerance in hallway from room to nursing stand. patient HR maintained WNL. no SOB noted. Patient reported various issues with current hospital stay. Charge nurse was educated on issues on this date. Charge nurse to address.     Extremity/Trunk Assessment              Vision       Perception     Praxis      Cognition Arousal/Alertness: Awake/alert Behavior During Therapy: WFL for tasks assessed/performed Overall Cognitive Status: Within Functional Limits for tasks assessed  Exercises     Shoulder Instructions       General Comments      Pertinent Vitals/ Pain       Pain Assessment: No/denies pain  Home Living                                          Prior Functioning/Environment              Frequency  Min 2X/week        Progress Toward Goals  OT Goals(current goals can now be found in the care plan section)  Progress towards OT goals: Progressing toward goals     Plan Discharge plan remains appropriate    Co-evaluation                 AM-PAC OT "6 Clicks" Daily Activity     Outcome Measure   Help from another  person eating meals?: A Little Help from another person taking care of personal grooming?: A Little Help from another person toileting, which includes using toliet, bedpan, or urinal?: A Little Help from another person bathing (including washing, rinsing, drying)?: A Little Help from another person to put on and taking off regular upper body clothing?: A Little Help from another person to put on and taking off regular lower body clothing?: A Lot 6 Click Score: 17    End of Session    OT Visit Diagnosis: Muscle weakness (generalized) (M62.81);Other abnormalities of gait and mobility (R26.89)   Activity Tolerance Patient tolerated treatment well   Patient Left in bed;with call bell/phone within reach;with bed alarm set   Nurse Communication Mobility status        Time: 7356-7014 OT Time Calculation (min): 32 min  Charges: OT General Charges $OT Visit: 1 Visit OT Treatments $Self Care/Home Management : 23-37 mins  Jackelyn Poling OTR/L, MS Acute Rehabilitation Department Office# [phone] Pager# [phone]   Marcellina Millin 01/28/2021, 1:13 PM

## 2021-01-29 ENCOUNTER — Inpatient Hospital Stay (HOSPITAL_COMMUNITY): Payer: Medicare Other

## 2021-01-29 DIAGNOSIS — I248 Other forms of acute ischemic heart disease: Secondary | ICD-10-CM | POA: Diagnosis not present

## 2021-01-29 DIAGNOSIS — N136 Pyonephrosis: Secondary | ICD-10-CM | POA: Diagnosis not present

## 2021-01-29 DIAGNOSIS — I5021 Acute systolic (congestive) heart failure: Secondary | ICD-10-CM | POA: Diagnosis not present

## 2021-01-29 DIAGNOSIS — J431 Panlobular emphysema: Secondary | ICD-10-CM | POA: Diagnosis not present

## 2021-01-29 LAB — CK: Total CK: 44 U/L (ref 38–234)

## 2021-01-29 LAB — COMPREHENSIVE METABOLIC PANEL
ALT: 22 U/L (ref 0–44)
AST: 19 U/L (ref 15–41)
Albumin: 3.4 g/dL — ABNORMAL LOW (ref 3.5–5.0)
Alkaline Phosphatase: 141 U/L — ABNORMAL HIGH (ref 38–126)
Anion gap: 7 (ref 5–15)
BUN: 13 mg/dL (ref 8–23)
CO2: 21 mmol/L — ABNORMAL LOW (ref 22–32)
Calcium: 9 mg/dL (ref 8.9–10.3)
Chloride: 111 mmol/L (ref 98–111)
Creatinine, Ser: 0.82 mg/dL (ref 0.44–1.00)
GFR, Estimated: >60 mL/min (ref >60)
Glucose, Bld: 116 mg/dL — ABNORMAL HIGH (ref 70–99)
Potassium: 3.6 mmol/L (ref 3.5–5.1)
Sodium: 139 mmol/L (ref 135–145)
Total Bilirubin: 0.6 mg/dL (ref 0.3–1.2)
Total Protein: 7 g/dL (ref 6.5–8.1)

## 2021-01-29 LAB — CBC WITH DIFFERENTIAL/PLATELET
Abs Immature Granulocytes: 0.11 10*3/uL — ABNORMAL HIGH (ref 0.00–0.07)
Basophils Absolute: 0 10*3/uL (ref 0.0–0.1)
Basophils Relative: 1 %
Eosinophils Absolute: 0.1 10*3/uL (ref 0.0–0.5)
Eosinophils Relative: 2 %
HCT: 43.9 % (ref 36.0–46.0)
Hemoglobin: 13.9 g/dL (ref 12.0–15.0)
Immature Granulocytes: 2 %
Lymphocytes Relative: 38 %
Lymphs Abs: 2.1 10*3/uL (ref 0.7–4.0)
MCH: 27.8 pg (ref 26.0–34.0)
MCHC: 31.7 g/dL (ref 30.0–36.0)
MCV: 87.8 fL (ref 80.0–100.0)
Monocytes Absolute: 0.6 10*3/uL (ref 0.1–1.0)
Monocytes Relative: 11 %
Neutro Abs: 2.5 10*3/uL (ref 1.7–7.7)
Neutrophils Relative %: 46 %
Platelets: 206 10*3/uL (ref 150–400)
RBC: 5 MIL/uL (ref 3.87–5.11)
RDW: 14.6 % (ref 11.5–15.5)
WBC: 5.5 10*3/uL (ref 4.0–10.5)
nRBC: 0 % (ref 0.0–0.2)

## 2021-01-29 LAB — GLUCOSE, CAPILLARY
Glucose-Capillary: 105 mg/dL — ABNORMAL HIGH (ref 70–99)
Glucose-Capillary: 143 mg/dL — ABNORMAL HIGH (ref 70–99)
Glucose-Capillary: 175 mg/dL — ABNORMAL HIGH (ref 70–99)
Glucose-Capillary: 190 mg/dL — ABNORMAL HIGH (ref 70–99)
Glucose-Capillary: 192 mg/dL — ABNORMAL HIGH (ref 70–99)
Glucose-Capillary: 218 mg/dL — ABNORMAL HIGH (ref 70–99)

## 2021-01-29 LAB — PHOSPHORUS: Phosphorus: 3.9 mg/dL (ref 2.5–4.6)

## 2021-01-29 LAB — MAGNESIUM: Magnesium: 2.2 mg/dL (ref 1.7–2.4)

## 2021-01-29 MED ORDER — MENTHOL 3 MG MT LOZG: 1.0000 | LOZENGE | OROMUCOSAL | 12 refills | Status: DC | PRN

## 2021-01-29 MED ORDER — METOPROLOL TARTRATE 25 MG PO TABS: 25.0000 mg | ORAL_TABLET | Freq: Two times a day (BID) | ORAL | 0 refills | Status: DC

## 2021-01-29 MED ORDER — ONDANSETRON HCL 4 MG PO TABS: 4.0000 mg | ORAL_TABLET | Freq: Four times a day (QID) | ORAL | 0 refills | Status: DC | PRN

## 2021-01-29 MED ORDER — GUAIFENESIN-DM 100-10 MG/5ML PO SYRP
5.0000 mL | ORAL_SOLUTION | ORAL | 0 refills | Status: DC | PRN
Start: 2021-01-29 — End: 2021-11-27

## 2021-01-29 MED ORDER — ROSUVASTATIN CALCIUM 20 MG PO TABS: 20.0000 mg | ORAL_TABLET | Freq: Every day | ORAL | 0 refills | Status: DC

## 2021-01-29 MED ORDER — SULFAMETHOXAZOLE-TRIMETHOPRIM 800-160 MG PO TABS: 1.0000 | ORAL_TABLET | Freq: Two times a day (BID) | ORAL | 0 refills | Status: AC

## 2021-01-29 MED ORDER — ACETAMINOPHEN 325 MG PO TABS
650.0000 mg | ORAL_TABLET | Freq: Four times a day (QID) | ORAL | 0 refills | Status: DC | PRN
Start: 2021-01-29 — End: 2021-11-27

## 2021-01-29 MED ORDER — NIFEDIPINE ER 30 MG PO TB24: 30.0000 mg | ORAL_TABLET | Freq: Every day | ORAL | 0 refills | Status: DC

## 2021-01-29 MED ORDER — BENZONATATE 100 MG PO CAPS: 100.0000 mg | ORAL_CAPSULE | Freq: Three times a day (TID) | ORAL | 0 refills | Status: DC | PRN

## 2021-01-29 NOTE — Progress Notes (Signed)
Patient discharged, discharge instructions given and explained to patient, she verbalized understanding, patient denies any distress. Waiting on ambulance to pick her home.

## 2021-01-29 NOTE — Discharge Summary (Signed)
Physician Discharge Summary  Mariah Reed NWG:[id number] DOB: 09/27/1945 DOA: 01/20/2021  PCP: Gladstone Lighter, MD  Admit date: 01/20/2021 Discharge date: 01/29/2021  Admitted From: Home Disposition: Home with Home Health PT/OT (Declined)  Recommendations for Outpatient Follow-up:  Follow up with PCP in 1-2 weeks Follow-up with urology within 1 to 2 weeks Follow-up with cardiology within 1 to 2 weeks and have outpatient evaluation for cardiac cath Check thyroid function test in 4 to 6 weeks  Please obtain CMP/CBC, Mag, Phos in one week Please follow up on the following pending results:  Home Health: No  Equipment/Devices: None     Discharge Condition: Stable  CODE STATUS: FULL CODE Diet recommendation: Heart Healthy Carb Modified Diet  Brief/Interim Summary: The patient is a 75 year old Caucasian female with a past medical history significant for but limited to seasonal allergies, COPD type 2 diabetes mellitus which is uncontrolled without complication, diabetic peripheral neuropathy, gout, hyperlipidemia, hypertension as well as chronic low back pain who presented from the [facility] ED department to [facility] due to severe sepsis in the setting of acute emphysematous pyelonephritis.  She is status post retrograde cystoscopy and staghorn stone removal and stent placement.  She was placed on IV Zosyn every 8 hours and blood cultures been negative.  Urine cultures were positive for E. coli that was resistant to ampicillin and Proteus was resistant to nitrofurantoin.  Urology recommended that she will require a PCNL to treat the large 4 cm staghorn stone that can be arranged in outpatient setting.  They recommend at least culture specific antibiotics for 2 weeks.  She subsequently also underwent rhabdomyolysis which is improved.  After she was given fluid hydration she went into volume overload and had chest pain.  Cardiology was consulted.  Cardiology did a nuclear medicine stress test  which showed an EF of 47% and showed some reversibility.  Cardiology now recommends medical management currently with outpatient catheterization.  She is an uncontrolled diabetic with a hemoglobin A1c of 10.5 and remains on Levemir.  Now she is complaining about vaginal discomfort requesting Diflucan as well as sugar-free cough drops.  She is given Diflucan x1 and she steadily improved.  Cardiology cleared the patient for discharge and made some medication recommendations for discharge.  She will need to follow-up with her PCP, urology as well as cardiology in outpatient setting.  She will be discharged on 2 weeks of Bactrim and will require PCNL to treat the large 4 cm staghorn calculus that can be arranged in the outpatient setting  Discharge Diagnoses:  Principal Problem:   Acute pyonephrosis Active Problems:   Diabetes mellitus type 2 with complications ([place])   HLD (hyperlipidemia)   GOUT   Essential hypertension   COPD (chronic obstructive pulmonary disease) (HCC)   Gastroesophageal reflux disease without esophagitis   Hypothyroidism   Severe sepsis (HCC)   Hydronephrosis of right kidney   Hydroureter on right   Rhabdomyolysis   Demand ischemia (HCC)   Elevated troponin   Acute systolic CHF (congestive heart failure) (HCC)  Severe sepsis POA / Acute pyonephrosis with Hydronephrosis of right kidney and Hydroureter on right -Status post retrogrades cystoscopy, -Staghorn stone removal and stent placement. -Continued on Zosyn every 8 hours. -Blood cultures negative -Urine cultures pos for ecoli resistant to ampicillin, proteus resistant to nitrofurantoin -Urology consult and intervention appreciated. Will require PCNL to treat large 4cm staghorn stone, can be arranged as outpatient -12/22 urology recommends culture specific abx x 2 weeks.  In a.m. will speak  with ID/pharmacy what is best p.o. medication to transition to in preparation for discharge and she was placed on p.o. Bactrim for  2 weeks -12/23 patient now on p.o. antibiotics stable for discharge once cardiology makes final recommendation on treatment modalities as below -KUB prior to discharge showed "Right ureteral stent with right lower pole staghorn calculus." -PT OT recommending home health but patient declined   Rhabdomyolysis -Resolved   DM type II uncontrolled with complication/DM Neuropathy -12/17 hemoglobin A1c= 10.5   - 12/22 increase Levemir 10 units daily -12/22 NovoLog 5 unitsQAC - 12/23 decrease sensitive  -CBGs ranging from 143-218 -Resume home SGLT2 inhibitor and glipizide at discharge   HLD (hyperlipidemia) Hold lovastatin as per above given her rhabdomyolysis but now cardiology suggesting switching from Mevacor to Crestor 20 mg p.o. daily and this was done for discharge   GOUT -12/21 uric acid = 3.0 at goal    Acute systolic CHF - 27/25 by nuclear med stress test EF 47%, also showed some reversibility see results  - Cardiology consulted and EF showed 50 to 55% on echo -Cardiology evaluated and discussed the results of her Myoview as well as also documented coronary atherosclerosis by CT imaging.  They are recommending medical therapy and outpatient cardiac follow-up with consideration for cardiac catheterization at that time -Per cardiology discussion she cannot take aspirin due to significant upset stomach but PCI would result and dual antiplatelet therapy so they are recommending optimizing medical therapy before considering any revascularization and recommending switching Mevacor to Crestor and increasing Lopressor at 5 mg p.o. twice daily and continue Procardia for now -As an outpatient she could be a reasonable candidate for ARB.  They are also recommending an SGLT2 inhibitor possibly and addition of Imdur and organ to recommend establishing with Dr. Garen Lah -Recommend no further cardiac tests planned and follow-up with cardiology within 1 to 2 weeks as she appears euvolemic    Essential hypertension -Procardia XL, 30mg  daily. -Metoprolol 12.5 mg BID increased to 25 mg p.o. twice daily -Given soft BP, held spironolactone -See above cardiology following and signed off the case   Tachycardia -New finding on 12/29. Noted to have HR into the 140's -Resolved after starting metoprolol and cardiology has increased the metoprolol as above   Chest pain - 12/21 EKG: NSR with LBBB -12/21 consulted to cardiology -See essential HTN -12/21 Heparin drip now stopped and she has been treated medically and she went under a Myoview as above.  Cardiology recommends continuing medical treatment and further medication titration in outpatient setting   Elevated troponin/Demand ischemia - 12/22 overnight patient's troponins elevated, but has now flattened out -See above   COPD (chronic obstructive pulmonary disease) ([place]) -Nebs as needed -Continue to monitor respiratory status carefully   Acute respiratory failure with hypoxia, improved -New finding this 12/19 -O2 requirement up to [facility] now weaning -Given CT chest findings of effusion with concerns of fluid, pt has received IV lasix with improvovement and O2 that is weaning -12/21 improving, given patient's chest pain continue O2.  Titrate to maintain SPO2> 92%  -12/23 on room air -SpO2: 97 % O2 Flow Rate (L/min): 2 L/min; no longer wearing supplemental oxygen nasal cannula -Repeat chest x-ray this a.m. showed "Left lower lobe airspace disease could reflect atelectasis or infiltrate." -She is not hypoxic and will need outpatient chest x-ray       Gastroesophageal reflux disease without esophagitis Continue PPI.   Hypothyroidism -12/21 TSH 5.5 elevated -12/22 increase levothyroxine on 100 mcg daily.  Recheck TSH in 6 weeks    Discharge Instructions  Discharge Instructions     Call MD for:  difficulty breathing, headache or visual disturbances   Complete by: As directed    Call MD for:  extreme fatigue   Complete  by: As directed    Call MD for:  hives   Complete by: As directed    Call MD for:  persistant dizziness or light-headedness   Complete by: As directed    Call MD for:  persistant nausea and vomiting   Complete by: As directed    Call MD for:  redness, tenderness, or signs of infection (pain, swelling, redness, odor or green/yellow discharge around incision site)   Complete by: As directed    Call MD for:  severe uncontrolled pain   Complete by: As directed    Call MD for:  temperature >100.4   Complete by: As directed    Diet - low sodium heart healthy   Complete by: As directed    Discharge instructions   Complete by: As directed    You were cared for by a hospitalist during your hospital stay. If you have any questions about your discharge medications or the care you received while you were in the hospital after you are discharged, you can call the unit and ask to speak with the hospitalist on call if the hospitalist that took care of you is not available. Once you are discharged, your primary care physician will handle any further medical issues. Please note that NO REFILLS for any discharge medications will be authorized once you are discharged, as it is imperative that you return to your primary care physician (or establish a relationship with a primary care physician if you do not have one) for your aftercare needs so that they can reassess your need for medications and monitor your lab values.  Follow up with PCP, Cardiology and Urology within 1-2 weeks. Take all medications as prescribed. If symptoms change or worsen please return to the ED for evaluation   Increase activity slowly   Complete by: As directed       Allergies as of 01/29/2021       Reactions   Levofloxacin Other (See Comments)   Made head feel weird   Nortriptyline Other (See Comments)   Caused rectal bleeding        Medication List     STOP taking these medications    hydrALAZINE 25 MG  tablet Commonly known as: APRESOLINE   lovastatin 40 MG tablet Commonly known as: MEVACOR   spironolactone 50 MG tablet Commonly known as: ALDACTONE       TAKE these medications    Accu-Chek FastClix Lancets Misc 1 Device by Does not apply route 3 (three) times daily. Use to check blood sugar up to three times a day   acetaminophen 325 MG tablet Commonly known as: TYLENOL Take 2 tablets (650 mg total) by mouth every 6 (six) hours as needed for mild pain (or Fever >/= 101).   albuterol 108 (90 Base) MCG/ACT inhaler Commonly known as: VENTOLIN HFA INHALE 2 PUFFS INTO THE LUNGS EVERY 4 HOURS AS NEEDED What changed:  how much to take when to take this reasons to take this additional instructions   alum & mag hydroxide-simeth [phone] MG/5ML suspension Commonly known as: MAALOX/MYLANTA Take 30 mLs by mouth 2 (two) times daily.   B-12 PO Take 1 tablet by mouth daily.   benzonatate 100 MG capsule Commonly known as:  TESSALON Take 1 capsule (100 mg total) by mouth 3 (three) times daily as needed for cough.   diclofenac 1.3 % Ptch Commonly known as: Flector Place 1 patch onto the skin 2 (two) times daily. What changed: how much to take   diclofenac sodium 1 % Gel Commonly known as: VOLTAREN APPLY 2 GRAMS TOPICALLY 4 TIMES DAILY   diphenoxylate-atropine 2.5-0.025 MG tablet Commonly known as: LOMOTIL Take 2 tablets by mouth 2 (two) times daily. Take with imodium   empagliflozin 25 MG Tabs tablet Commonly known as: JARDIANCE Take 25 mg by mouth daily after lunch. What changed: Another medication with the same name was removed. Continue taking this medication, and follow the directions you see here.   esomeprazole 40 MG capsule Commonly known as: NEXIUM TAKE 1 CAPSULE BY MOUTH ONCE DAILY What changed:  how to take this when to take this reasons to take this   FEXOFENADINE HCL PO Take 0.5 tablets by mouth 2 (two) times daily.   fluticasone 50 MCG/ACT nasal  spray Commonly known as: FLONASE Place 2 sprays into both nostrils daily. What changed:  when to take this reasons to take this   glipiZIDE 10 MG tablet Commonly known as: GLUCOTROL Take 1 tablet (10 mg total) by mouth 2 (two) times daily before a meal.   guaiFENesin-dextromethorphan 100-10 MG/5ML syrup Commonly known as: ROBITUSSIN DM Take 5 mLs by mouth every 4 (four) hours as needed for cough.   hydrOXYzine 50 MG tablet Commonly known as: ATARAX Take 1 tablet (50 mg total) by mouth 3 (three) times daily as needed. What changed:  when to take this reasons to take this   levothyroxine 75 MCG tablet Commonly known as: SYNTHROID Take 1 tablet (75 mcg total) by mouth daily. What changed: when to take this   lipase/protease/amylase 36000 UNITS Cpep capsule Commonly known as: CREON Take 36,000-72,000 Units by mouth See admin instructions. Take 2 capsules (72000 units) by mouth three times daily with meals, take 1 capsule (36000 units) with snacks or drinks   loperamide 2 MG capsule Commonly known as: IMODIUM Take 8 mg by mouth 2 (two) times daily. Take with lomotil   menthol-cetylpyridinium 3 MG lozenge Commonly known as: CEPACOL Take 1 lozenge (3 mg total) by mouth as needed for sore throat.   metoprolol tartrate 25 MG tablet Commonly known as: LOPRESSOR Take 1 tablet (25 mg total) by mouth 2 (two) times daily. What changed:  medication strength how much to take how to take this when to take this additional instructions   NIFEdipine 30 MG 24 hr tablet Commonly known as: ADALAT CC Take 1 tablet (30 mg total) by mouth daily. Start taking on: January 30, 2021 What changed:  medication strength how much to take   nitroGLYCERIN 0.4 MG SL tablet Commonly known as: NITROSTAT Take SL prn chest pain/May repeat 5 min apart up to 3 times prn What changed:  how much to take when to take this reasons to take this additional instructions   nystatin cream Commonly  known as: MYCOSTATIN APPLY TO AFFECTED AREAS ONCE DAILY AS NEEDED What changed: See the new instructions.   ondansetron 4 MG tablet Commonly known as: ZOFRAN Take 1 tablet (4 mg total) by mouth every 6 (six) hours as needed for nausea.   oxyCODONE 20 mg 12 hr tablet Commonly known as: OxyCONTIN Take 1 tablet (20 mg total) by mouth every 12 (twelve) hours. What changed:  how much to take when to take this reasons to  take this   Pataday 0.2 % Soln Generic drug: Olopatadine HCl PLACE 1 DROP INTO BOTH EYES EVERY DAY What changed: See the new instructions.   pregabalin 25 MG capsule Commonly known as: LYRICA Take 1 capsule (25 mg total) by mouth 2 (two) times daily.   rosuvastatin 20 MG tablet Commonly known as: Crestor Take 1 tablet (20 mg total) by mouth daily.   Spiriva HandiHaler 18 MCG inhalation capsule Generic drug: tiotropium INHALE THE CONTENTS OF 1 CAPSUOLE VIA HANDIHALER ONCE DAILY What changed:  how much to take when to take this additional instructions   sulfamethoxazole-trimethoprim 800-160 MG tablet Commonly known as: BACTRIM DS Take 1 tablet by mouth every 12 (twelve) hours for 14 days.   tiZANidine 4 MG tablet Commonly known as: ZANAFLEX Take 4 mg by mouth See admin instructions. Take one tablet (4 mg) by mouth twice daily - at bedtime and 1am   topiramate 100 MG tablet Commonly known as: TOPAMAX Take 3 tablets (300 mg total) by mouth daily. What changed:  how much to take when to take this   zolpidem 5 MG tablet Commonly known as: AMBIEN Take 1 tablet (5 mg total) by mouth at bedtime as needed. for sleep What changed: when to take this        [place]. Schedule an appointment as soon as possible for a visit.   Specialty: Urology Contact information: [place], [place]        Kate Sable, MD Follow up.   Specialties:  Cardiology, Radiology Why: cardiology scheduler will contact you to arrange follow up with Dr. Garen Lah, please give [place] a call if you do not hear from our scheduler in 3 business days. Contact information: [place] [address] [phone]                Allergies  Allergen Reactions   Levofloxacin Other (See Comments)    Made head feel weird   Nortriptyline Other (See Comments)    Caused rectal bleeding    Consultations: Urology Cardiology  Procedures/Studies: CT ABDOMEN PELVIS WO CONTRAST  Result Date: 01/24/2021 CLINICAL DATA:  Nephrolithiasis. EXAM: CT ABDOMEN AND PELVIS WITHOUT CONTRAST TECHNIQUE: Multidetector CT imaging of the abdomen and pelvis was performed following the standard protocol without IV contrast. COMPARISON:  CT abdomen and pelvis 01/21/2021. FINDINGS: Lower chest: Small bilateral pleural effusions have increased. There is new right lower lobe airspace disease and new left lower lobe atelectasis. Hepatobiliary: Gallbladder surgically absent. There is stable dilatation of the common bile duct measuring 12 mm. There is no intrahepatic biliary ductal dilatation. The liver is within normal limits. Pancreas: Unremarkable. No pancreatic ductal dilatation or surrounding inflammatory changes. Spleen: Normal in size without focal abnormality. Adrenals/Urinary Tract: Right ureteral stent is unchanged in position. There is no hydronephrosis. 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 Small right peripelvic cyst is unchanged. No ureteral calculi are seen. The left kidney and ureter are within normal limits. 2.9 cm mildly hyperdense cyst in the superior pole the left kidney is unchanged. Otherwise, the left kidney and ureter are within normal limits. There is a small amount of air in the bladder. There is also hyperdensity seen throughout the bladder. The adrenal glands are within normal limits. Stomach/Bowel: Stomach is within  normal limits. Appendix appears normal. No evidence of bowel wall thickening, distention, or inflammatory changes. The appendix is not seen. There  is sigmoid colon diverticulosis without evidence for acute diverticulitis. Vascular/Lymphatic: Aortic atherosclerosis. No enlarged abdominal or pelvic lymph nodes. Reproductive: Status post hysterectomy. No adnexal masses. Other: No abdominal wall hernia or abnormality. No abdominopelvic ascites. There is stable mild body wall edema. Musculoskeletal: No acute or significant osseous findings. IMPRESSION: 1. Right ureteral stent is unchanged in position. There is no hydronephrosis. 2. 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  No right ureteral calculi. 3. Small amount of air in the bladder which may be related to recent instrumentation or infection. The bladder is mildly hyperdense likely related to recent contrast administration. 4. Increasing small bilateral pleural effusions with new right lower lobe airspace disease and new left lower lobe atelectasis. 5. Body wall edema. 6.  Aortic Atherosclerosis (ICD10-I70.0). Electronically Signed   By: Ronney Asters M.D.   On: 01/24/2021 15:23   CT ABDOMEN PELVIS WO CONTRAST  Result Date: 01/21/2021 CLINICAL DATA:  Hydronephrosis. EXAM: CT ABDOMEN AND PELVIS WITHOUT CONTRAST TECHNIQUE: Multidetector CT imaging of the abdomen and pelvis was performed following the standard protocol without IV contrast. COMPARISON:  CT abdomen and pelvis 01/20/2021. FINDINGS: Lower chest: There is new atelectasis in the bilateral lower lobes. Hepatobiliary: Gallbladder surgically absent. Common bile duct is dilated, but stable in size. Liver is within normal limits. Pancreas: Atrophic, unchanged. Spleen: Normal in size without focal abnormality. Adrenals/Urinary Tract: A new right ureteral stent is in place. There is no hydronephrosis. No ureteral calculi are seen. 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  There is a small amount of air in the right renal pelvis and collecting system which has slightly decreased. Right perinephric and Peri ureteral stranding is present, but has decreased. There is a stable mildly hyperdense low-attenuation area in the superior pole the left kidney measuring 3.2 cm. There are additional hypodensities in the kidneys which are too small to characterize. There is no left-sided hydronephrosis or urinary tract calculus. The bladder is decompressed by Foley catheter. The adrenal glands are within normal limits. Stomach/Bowel: Stomach is within normal limits. No evidence of bowel wall thickening, distention, or inflammatory changes. The appendix is not visualized. Vascular/Lymphatic: Aortic atherosclerosis. No enlarged abdominal or pelvic lymph nodes. Reproductive: Status post hysterectomy. No adnexal masses. Other: There is trace free fluid in the pelvis. There is no abdominal wall hernia. Musculoskeletal: No acute or significant osseous findings. IMPRESSION: 1. New right ureteral stent in place. No hydronephrosis. There is a small amount of air in the right renal collecting system and pelvis which has decreased from prior. Infection not excluded. 2. Stable right staghorn calculus.  No right ureteral calculi. 3. Right perinephric and Periureteral stranding has decreased. 4. Stable lesion in the superior pole the left kidney, likely a proteinaceous cyst. 5.  Aortic Atherosclerosis (ICD10-I70.0). Electronically Signed   By: Ronney Asters M.D.   On: 01/21/2021 15:13   CT Abdomen Pelvis Wo Contrast  Result Date: 01/20/2021 CLINICAL DATA:  75 year old female with history of right lower quadrant abdominal pain. Suspected bowel obstruction. EXAM: CT ABDOMEN AND PELVIS WITHOUT CONTRAST TECHNIQUE: Multidetector CT imaging of the abdomen and pelvis was performed following the standard protocol without IV contrast. COMPARISON:  CT of the abdomen and pelvis 07/28/2020. FINDINGS: Lower chest: Aortic  atherosclerosis. Patchy areas of ground-glass attenuation and septal thickening noted in the visualized lung bases. Hepatobiliary: No definite suspicious cystic or solid hepatic lesions are confidently identified on today's noncontrast CT examination. Status post cholecystectomy. Common bile duct is dilated measuring up to  14 mm in the porta hepatis, similar to prior examination from 07/28/2020, likely reflective of benign post cholecystectomy physiology. Pancreas: Diffuse fatty atrophy in the pancreas. No definite pancreatic mass or peripancreatic fluid collections or inflammatory changes are confidently identified on today's noncontrast CT examination. Spleen: Tiny splenule inferior to the spleen. Otherwise, unremarkable. Adrenals/Urinary Tract: Large staghorn calculus again noted in the lower pole collecting system of the right kidney extending into the right renal pelvis estimated to measure approximately 4.0 x 1.7 x 1.9 cm. In addition, in the distal third of the right ureter at or immediately before the right ureterovesicular junction (axial image 79 of series 3) there is a 3 mm obstructing calculus which is associated with mild proximal right hydroureteronephrosis. Extensive right-sided perinephric stranding is noted, out of proportion to the degree of right-sided hydroureteronephrosis, suggestive of recent rupture of a calyx. There is some fluid tracking caudally throughout the right retroperitoneal space and right pericolic gutter. Notably, there is also gas present within the right renal collecting system, right ureter and non dependent portion of the urinary bladder. Unenhanced appearance of the left kidney and bilateral adrenal glands is normal. Urinary bladder is otherwise grossly normal in appearance. Stomach/Bowel: The unenhanced appearance of the stomach is normal. No pathologic dilatation of small bowel or colon. The appendix is not confidently identified and may be surgically absent. Regardless,  there are no inflammatory changes noted adjacent to the cecum to suggest the presence of an acute appendicitis at this time. Vascular/Lymphatic: Atherosclerotic calcifications in the abdominal aorta and pelvic vasculature. No lymphadenopathy noted in the abdomen or pelvis. Reproductive: Status post hysterectomy.  Ovaries are atrophic. Other: No significant volume of ascites.  No pneumoperitoneum. Musculoskeletal: There are no aggressive appearing lytic or blastic lesions noted in the visualized portions of the skeleton. IMPRESSION: 1. 3 mm obstructing calculus at the right ureterovesicular junction with mild proximal right hydroureteronephrosis, but extensive right-sided perinephric stranding out of proportion to the degree of apparent obstruction, suggesting rupture of a calyx. Notably, there is also extensive gas throughout the urinary bladder, right ureter and right renal collecting system, suggesting right-sided pyonephrosis. There is also a large staghorn calculus in the lower pole collecting system of the right kidney which extends into the right renal pelvis. Urologic consultation is strongly recommended. 2. Aortic atherosclerosis. 3. Findings in the lung bases which could be indicative of early or mild interstitial lung disease. Nonemergent outpatient follow-up high-resolution chest CT is recommended in the near future to better evaluate these findings. 4. Additional incidental findings, as above. Electronically Signed   By: Vinnie Langton M.D.   On: 01/20/2021 08:20   DG Abd 1 View  Result Date: 01/29/2021 CLINICAL DATA:  Left lower quadrant abdominal pain EXAM: ABDOMEN - 1 VIEW COMPARISON:  CT abdomen/pelvis dated 01/24/2021 FINDINGS: Right ureteral stent. Calculi in the right lower pole renal collecting system. Calcified pelvic phleboliths overlying the right pelvis. Cholecystectomy clips. Nonobstructive bowel gas pattern. 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  Electronically Signed   By: Julian Hy M.D.   On: 01/29/2021 12:40   CT Angio Chest Pulmonary Embolism (PE) W or WO Contrast  Result Date: 01/23/2021 CLINICAL DATA:  Rule out pulmonary embolus. EXAM: CT ANGIOGRAPHY CHEST WITH CONTRAST TECHNIQUE: Multidetector CT imaging of the chest was performed using the standard protocol during bolus administration of intravenous contrast. Multiplanar CT image reconstructions and MIPs were obtained to evaluate the vascular anatomy. CONTRAST:  1mL OMNIPAQUE IOHEXOL 350 MG/ML SOLN COMPARISON:  None. FINDINGS: Cardiovascular: Exam detail is diminished secondary to respiratory motion artifact. Pulmonary arterial opacification is also suboptimal. Within these limitations there is no signs of central pulmonary artery or lobar pulmonary artery embolus. Beyond the lobar pulmonary arteries exam detail is essentially nondiagnostic for pulmonary embolus. Heart size is upper limits of normal. No pericardial effusion. Aortic atherosclerosis. Mild LAD coronary artery calcifications. Mediastinum/Nodes: No enlarged mediastinal, hilar, or axillary lymph nodes. Thyroid gland, trachea, and esophagus demonstrate no significant findings. Lungs/Pleura: Significantly degraded exam detail of the lungs due to motion artifact. Small left pleural effusion and moderate right pleural effusion identified. There is diffuse interlobular septal thickening identified suggesting interstitial edema. Airspace consolidation versus compressive type atelectasis is noted overlying the right pleural effusion, image 97/7. Subpleural consolidation is also noted within the posterior and lateral right upper lobe, image 43/7.Indeterminate subpleural nodule is identified within the anterior left upper lobe measuring 1.2 cm, image 70/7. Upper Abdomen: Images through the upper abdomen are also degraded by motion artifact. Signs of previous cholecystectomy with increase caliber of the common bile duct is again noted  and appears similar to CT from 01/21/2021. Musculoskeletal: No chest wall abnormality. No acute or significant osseous findings. Review of the MIP images confirms the above findings. IMPRESSION: 1. Exam detail is diminished secondary to respiratory motion artifact and diminished pulmonary arterial opacification. Within these limitations there is no signs of central pulmonary artery or lobar pulmonary artery embolus. Beyond the lobar pulmonary arteries exam detail is essentially nondiagnostic for pulmonary embolus. 2. Bilateral pleural effusions, right greater than left. There is, bilateral interlobular septal thickening compatible with interstitial edema. Correlate for signs or symptoms of congestive heart failure. 3. Airspace consolidation versus compressive type atelectasis is noted overlying the right pleural effusion. Underlying infection not excluded. 4. Indeterminate subpleural nodule is identified within the anterior left upper lobe measuring 1.2 cm. Repeat CT of the chest following resolution of patient's current acute clinical condition is recommended for more definitive characterization. 5. Aortic Atherosclerosis (ICD10-I70.0). Electronically Signed   By: Kerby Moors M.D.   On: 01/23/2021 11:46   NM Myocar Multi W/Spect W/Wall Motion / EF  Result Date: 01/27/2021 CLINICAL DATA:  Nonspecific chest pain EXAM: MYOCARDIAL IMAGING WITH SPECT (REST AND PHARMACOLOGIC-STRESS) GATED LEFT VENTRICULAR WALL MOTION STUDY LEFT VENTRICULAR EJECTION FRACTION TECHNIQUE: Standard myocardial SPECT imaging was performed after resting intravenous injection of 10.1 mCi Tc-48m tetrofosmin. Subsequently, intravenous infusion of Lexiscan was performed under the supervision of the Cardiology staff. At peak effect of the drug, 31.4 mCi Tc-78m tetrofosmin was injected intravenously and standard myocardial SPECT imaging was performed. Quantitative gated imaging was also performed to evaluate left ventricular wall motion, and  estimate left ventricular ejection fraction. COMPARISON:  None. FINDINGS: Perfusion: Matched substantially reduced activity at the cardiac apex favoring scar over apical thinning. Small focus of reversibility suggesting inducible ischemia in the inferior wall near the apex. There is a substantial degree of motion artifact on the rest images which reduces signal to noise ratio. Wall Motion: Septal hypokinesis noted with a small amount of septal dyskinesis. Mild anterior and inferior wall hypokinesis. Left Ventricular Ejection Fraction: 47 % End diastolic volume 86 ml End systolic volume 46 ml IMPRESSION: 1. Apical scar favored over apical thinning. Also small region of inferior wall inducible ischemia near the apex. 2. Septal hypokinesis with mild septal dyskinesis. Mild anterior and inferior wall hypokinesis 3. Left ventricular ejection fraction 47% 4. Non invasive risk stratification*: Intermediate *2012 Appropriate Use Criteria for Coronary Revascularization Focused  Update: J Am Coll Cardiol. 0998;33(8):250-539. [email or web address] Electronically Signed   By: Van Clines M.D.   On: 01/27/2021 14:51   DG CHEST PORT 1 VIEW  Result Date: 01/29/2021 CLINICAL DATA:  Shortness of breath EXAM: PORTABLE CHEST 1 VIEW COMPARISON:  01/20/2021 FINDINGS: Minimal left lower lobe airspace opacity. Right lung clear. Heart is normal size. No effusions or acute bony abnormality. IMPRESSION: Left lower lobe airspace disease could reflect atelectasis or infiltrate. Electronically Signed   By: Rolm Baptise M.D.   On: 01/29/2021 06:03   DG Chest Port 1 View  Result Date: 01/20/2021 CLINICAL DATA:  Questionable sepsis, RIGHT lower quadrant abdominal pain since last night, weakness, COPD, hypertension EXAM: PORTABLE CHEST 1 VIEW COMPARISON:  Portable exam 0834 hours compared to 10/29/2012 FINDINGS: Normal heart size and pulmonary vascularity. Mediastinal contours grossly normal  for positioning and scoliosis. Questionable LEFT perihilar infiltrate. Remaining lungs clear. No pleural effusion or pneumothorax. Mild biconvex thoracolumbar scoliosis. IMPRESSION: Questionable LEFT perihilar infiltrate Electronically Signed   By: Lavonia Dana M.D.   On: 01/20/2021 08:52   DG C-Arm 1-60 Min-No Report  Result Date: 01/20/2021 Fluoroscopy was utilized by the requesting physician.  No radiographic interpretation.   ECHOCARDIOGRAM COMPLETE  Result Date: 01/24/2021    ECHOCARDIOGRAM REPORT   Patient Name:   NICHOLETTE DOLSON Date of Exam: 01/24/2021 Medical Rec #:  [id number]       Height:       65.0 in Accession #:    [id number]      Weight:       170.0 lb Date of Birth:  [birth year]        BSA:          1.846 m Patient Age:    75 years        BP:           86/51 mmHg Patient Gender: F               HR:           76 bpm. Exam Location:  Inpatient Procedure: 2D Echo, Cardiac Doppler and Color Doppler Indications:    CHF  History:        Patient has no prior history of Echocardiogram examinations.                 Previous Myocardial Infarction, COPD, Arrythmias:LBBB; Risk                 Factors:Diabetes and Hypertension.  Sonographer:    Glo Herring Referring Phys: [place]  1. Left ventricular ejection fraction, by estimation, is 50 to 55%. The left ventricle has low normal function. The left ventricle has no regional wall motion abnormalities. Left ventricular diastolic parameters were normal.  2. Right ventricular systolic function is normal. The right ventricular size is normal.  3. Left atrial size was mildly dilated.  4. The mitral valve is grossly normal. Trivial mitral valve regurgitation. No evidence of mitral stenosis.  5. The aortic valve is grossly normal. Aortic valve regurgitation is not visualized. No aortic stenosis is present.  6. The inferior vena cava is dilated in size with >50% respiratory variability, suggesting right atrial pressure of 8 mmHg.  Comparison(s): No prior Echocardiogram. Conclusion(s)/Recommendation(s): Otherwise normal echocardiogram, with minor abnormalities described in the report. FINDINGS  Left Ventricle: Left ventricular ejection fraction, by estimation, is 50 to 55%. The left ventricle has low normal function. The left ventricle has no regional wall motion abnormalities.  The left ventricular internal cavity size was normal in size. There is no left ventricular hypertrophy. Abnormal (paradoxical) septal motion, consistent with left bundle branch block. Left ventricular diastolic parameters were normal. Right Ventricle: The right ventricular size is normal. Right vetricular wall thickness was not well visualized. Right ventricular systolic function is normal. Left Atrium: Left atrial size was mildly dilated. Right Atrium: Right atrial size was normal in size. Pericardium: There is no evidence of pericardial effusion. Presence of epicardial fat layer. Mitral Valve: The mitral valve is grossly normal. Trivial mitral valve regurgitation. No evidence of mitral valve stenosis. Tricuspid Valve: The tricuspid valve is grossly normal. Tricuspid valve regurgitation is trivial. No evidence of tricuspid stenosis. Aortic Valve: The aortic valve is grossly normal. Aortic valve regurgitation is not visualized. No aortic stenosis is present. Aortic valve mean gradient measures 5.0 mmHg. Aortic valve peak gradient measures 9.9 mmHg. Aortic valve area, by VTI measures 1.47 cm. Pulmonic Valve: The pulmonic valve was not well visualized. Pulmonic valve regurgitation is not visualized. Aorta: The aortic root, ascending aorta, aortic arch and descending aorta are all structurally normal, with no evidence of dilitation or obstruction. Venous: The inferior vena cava is dilated in size with greater than 50% respiratory variability, suggesting right atrial pressure of 8 mmHg. IAS/Shunts: The interatrial septum was not well visualized.  LEFT VENTRICLE PLAX 2D  LVIDd:         4.50 cm      Diastology LVIDs:         2.80 cm      LV e' medial:    7.83 cm/s LV PW:         1.00 cm      LV E/e' medial:  12.4 LV IVS:        1.00 cm      LV e' lateral:   8.05 cm/s LVOT diam:     1.85 cm      LV E/e' lateral: 12.0 LV SV:         45 LV SV Index:   25 LVOT Area:     2.69 cm  LV Volumes (MOD) LV vol d, MOD A2C: 84.7 ml LV vol d, MOD A4C: 101.0 ml LV vol s, MOD A2C: 40.9 ml LV vol s, MOD A4C: 49.7 ml LV SV MOD A2C:     43.8 ml LV SV MOD A4C:     101.0 ml LV SV MOD BP:      49.7 ml RIGHT VENTRICLE             IVC RV Basal diam:  3.20 cm     IVC diam: 2.20 cm RV Mid diam:    2.20 cm RV S prime:     12.30 cm/s LEFT ATRIUM             Index        RIGHT ATRIUM           Index LA diam:        3.50 cm 1.90 cm/m   RA Area:     14.10 cm LA Vol (A2C):   59.0 ml 31.96 ml/m  RA Volume:   31.20 ml  16.90 ml/m LA Vol (A4C):   46.4 ml 25.13 ml/m LA Biplane Vol: 53.9 ml 29.20 ml/m  AORTIC VALVE                     PULMONIC VALVE AV Area (Vmax):    1.48 cm  PV Vmax:       0.95 m/s AV Area (Vmean):   1.45 cm      PV Peak grad:  3.6 mmHg AV Area (VTI):     1.47 cm AV Vmax:           157.00 cm/s AV Vmean:          109.000 cm/s AV VTI:            0.310 m AV Peak Grad:      9.9 mmHg AV Mean Grad:      5.0 mmHg LVOT Vmax:         86.20 cm/s LVOT Vmean:        58.800 cm/s LVOT VTI:          0.169 m LVOT/AV VTI ratio: 0.55  AORTA Ao Root diam: 2.90 cm MITRAL VALVE MV Area (PHT): 3.81 cm    SHUNTS MV Decel Time: 199 msec    Systemic VTI:  0.17 m MV E velocity: 96.80 cm/s  Systemic Diam: 1.85 cm MV A velocity: 96.40 cm/s MV E/A ratio:  1.00 Buford Dresser MD Electronically signed by Buford Dresser MD Signature Date/Time: 01/24/2021/3:15:12 PM    Final    VAS [place] LOWER EXTREMITY VENOUS (DVT)  Result Date: 01/21/2021  Lower Venous DVT Study Patient Name:  ALNISA HASLEY  Date of Exam:   01/20/2021 Medical Rec #: [id number]        Accession #:    [id number] Date of Birth: Mar 16, 1945          Patient Gender: F Patient Age:   75 years Exam Location:  [facility] Procedure:      VAS [place] LOWER EXTREMITY VENOUS (DVT) Referring Phys: DAVID ORTIZ --------------------------------------------------------------------------------  Indications: Swelling.  Risk Factors: None identified. Limitations: Poor ultrasound/tissue interface and patient pain tolerance. Comparison Study: No prior studies. Performing Technologist: Oliver Hum RVT  Examination Guidelines: A complete evaluation includes B-mode imaging, spectral Doppler, color Doppler, and power Doppler as needed of all accessible portions of each vessel. Bilateral testing is considered an integral part of a complete examination. Limited examinations for reoccurring indications may be performed as noted. The reflux portion of the exam is performed with the patient in reverse Trendelenburg.  +---------+---------------+---------+-----------+----------+--------------+  RIGHT     Compressibility Phasicity Spontaneity Properties Thrombus Aging  +---------+---------------+---------+-----------+----------+--------------+  CFV       Full            Yes       Yes                                    +---------+---------------+---------+-----------+----------+--------------+  SFJ       Full                                                             +---------+---------------+---------+-----------+----------+--------------+  FV Prox   Full                                                             +---------+---------------+---------+-----------+----------+--------------+  FV Mid    Full                                                             +---------+---------------+---------+-----------+----------+--------------+  FV Distal Full                                                             +---------+---------------+---------+-----------+----------+--------------+  PFV       Full                                                              +---------+---------------+---------+-----------+----------+--------------+  POP       Full            Yes       Yes                                    +---------+---------------+---------+-----------+----------+--------------+  PTV       Full                                                             +---------+---------------+---------+-----------+----------+--------------+  PERO      Full                                                             +---------+---------------+---------+-----------+----------+--------------+   +---------+---------------+---------+-----------+----------+--------------+  LEFT      Compressibility Phasicity Spontaneity Properties Thrombus Aging  +---------+---------------+---------+-----------+----------+--------------+  CFV       Full            Yes       Yes                                    +---------+---------------+---------+-----------+----------+--------------+  SFJ       Full                                                             +---------+---------------+---------+-----------+----------+--------------+  FV Prox   Full                                                             +---------+---------------+---------+-----------+----------+--------------+  FV Mid    Full                                                             +---------+---------------+---------+-----------+----------+--------------+  FV Distal                 Yes       Yes                                    +---------+---------------+---------+-----------+----------+--------------+  PFV       Full                                                             +---------+---------------+---------+-----------+----------+--------------+  POP       Full            Yes       Yes                                    +---------+---------------+---------+-----------+----------+--------------+  PTV       Full                                                              +---------+---------------+---------+-----------+----------+--------------+  PERO      Full                                                             +---------+---------------+---------+-----------+----------+--------------+     Summary: RIGHT: - There is no evidence of deep vein thrombosis in the lower extremity. However, portions of this examination were limited- see technologist comments above.  - No cystic structure found in the popliteal fossa.  LEFT: - There is no evidence of deep vein thrombosis in the lower extremity. However, portions of this examination were limited- see technologist comments above.  - No cystic structure found in the popliteal fossa.  *See table(s) above for measurements and observations. Electronically signed by Orlie Pollen on 01/21/2021 at 10:28:34 AM.    Final      Subjective: Seen and examined at bedside and states that her allergies were bad and was having some abdominal discomfort.  Woke her abdominal discomfort up with a KUB but then patient states that she felt better and did not want me to swab her nose for respiratory virus panel.  She felt well and was deemed stable to be discharged home and will need to follow-up with her PCP, cardiologist as well as urologist.  GI complaints or concerns and is back to her baseline.  Discharge Exam: Vitals:   01/29/21 1110  01/29/21 1251  BP: 138/70 (!) 146/79  Pulse: 82 86  Resp:  16  Temp:  98.2 F (36.8 C)  SpO2:  97%   Vitals:   01/28/21 2031 01/29/21 0503 01/29/21 1110 01/29/21 1251  BP: (!) 142/90 136/72 138/70 (!) 146/79  Pulse: 80 69 82 86  Resp: 16 16  16   Temp: 99.1 F (37.3 C) 98.1 F (36.7 C)  98.2 F (36.8 C)  TempSrc: Oral Oral  Oral  SpO2: 97% 98%  97%  Weight:      Height:       General: Pt is alert, awake, not in acute distress Cardiovascular: RRR, S1/S2 +, no rubs, no gallops Respiratory: Diminished bilaterally, no wheezing, no rhonchi; unlabored breathing Abdominal: Soft, NT, ND, bowel  sounds + Extremities: no edema, no cyanosis  The results of significant diagnostics from this hospitalization (including imaging, microbiology, ancillary and laboratory) are listed below for reference.    Microbiology: Recent Results (from the past 240 hour(s))  Blood Culture (routine x 2)     Status: None   Collection Time: 01/20/21  8:10 AM   Specimen: BLOOD  Result Value Ref Range Status   Specimen Description BLOOD BLOOD LEFT HAND  Final   Special Requests   Final    BOTTLES DRAWN AEROBIC AND ANAEROBIC Blood Culture results may not be optimal due to an excessive volume of blood received in culture bottles   Culture   Final    NO GROWTH 5 DAYS Performed at [facility], [place] [address] [place], [place] [address]    Report Status 01/25/2021 FINAL  Final  Resp Panel by RT-PCR (Flu A&B, Covid) Nasopharyngeal Swab     Status: None   Collection Time: 01/20/21  8:14 AM   Specimen: Nasopharyngeal Swab; Nasopharyngeal(NP) swabs in vial transport medium  Result Value Ref Range Status   SARS Coronavirus 2 by RT PCR NEGATIVE NEGATIVE Final    Comment: (NOTE) SARS-CoV-2 target nucleic acids are NOT DETECTED.  The SARS-CoV-2 RNA is generally detectable in upper respiratory specimens during the acute phase of infection. The lowest concentration of SARS-CoV-2 viral copies this assay can detect is 138 copies/mL. A negative result does not preclude SARS-Cov-2 infection and should not be used as the sole basis for treatment or other patient management decisions. A negative result may occur with  improper specimen collection/handling, submission of specimen other than nasopharyngeal swab, presence of viral mutation(s) within the areas targeted by this assay, and inadequate number of viral copies(<138 copies/mL). A negative result must be combined with clinical observations, patient history, and epidemiological information. The expected result is Negative.  Fact Sheet for Patients:   EntrepreneurPulse.com.au  Fact Sheet for Healthcare Providers:  IncredibleEmployment.be  This test is no t yet approved or cleared by the [place] FDA and  has been authorized for detection and/or diagnosis of SARS-CoV-2 by FDA under an Emergency Use Authorization (EUA). This EUA will remain  in effect (meaning this test can be used) for the duration of the COVID-19 declaration under Section 564(b)(1) of the Act, 21 U.S.C.section 360bbb-3(b)(1), unless the authorization is terminated  or revoked sooner.       Influenza A by PCR NEGATIVE NEGATIVE Final   Influenza B by PCR NEGATIVE NEGATIVE Final    Comment: (NOTE) The Xpert Xpress SARS-CoV-2/FLU/RSV plus assay is intended as an aid in the diagnosis of influenza from Nasopharyngeal swab specimens and should not be used as a sole basis for treatment. Nasal washings and  aspirates are unacceptable for Xpert Xpress SARS-CoV-2/FLU/RSV testing.  Fact Sheet for Patients: EntrepreneurPulse.com.au  Fact Sheet for Healthcare Providers: IncredibleEmployment.be  This test is not yet approved or cleared by the [place] FDA and has been authorized for detection and/or diagnosis of SARS-CoV-2 by FDA under an Emergency Use Authorization (EUA). This EUA will remain in effect (meaning this test can be used) for the duration of the COVID-19 declaration under Section 564(b)(1) of the Act, 21 U.S.C. section 360bbb-3(b)(1), unless the authorization is terminated or revoked.  Performed at [facility], [place] [address] [place], [place] [address]   Blood Culture (routine x 2)     Status: None   Collection Time: 01/20/21 10:50 AM   Specimen: BLOOD  Result Value Ref Range Status   Specimen Description   Final    BLOOD RIGHT ANTECUBITAL Performed at [place] [address], [place], [place] [address]    Special Requests   Final     BOTTLES DRAWN AEROBIC AND ANAEROBIC Blood Culture adequate volume Performed at [place] [address], [place], [place] [address]    Culture   Final    NO GROWTH 5 DAYS Performed at [facility], [place] [address], [place], [place] [address]    Report Status 01/25/2021 FINAL  Final  Urine Culture     Status: Abnormal   Collection Time: 01/20/21  2:21 PM   Specimen: In/Out Cath Urine  Result Value Ref Range Status   Specimen Description   Final    IN/OUT CATH URINE Performed at [facility] [facility] [address] [place], [place] [address]    Special Requests   Final    NONE Performed at [facility], [place] [address], [place], [place] [address]    Culture (A)  Final    40,000 COLONIES/mL ESCHERICHIA COLI 20,000 COLONIES/mL PROTEUS MIRABILIS    Report Status 01/23/2021 FINAL  Final   Organism ID, Bacteria ESCHERICHIA COLI (A)  Final   Organism ID, Bacteria PROTEUS MIRABILIS (A)  Final      Susceptibility   Escherichia coli - MIC*    AMPICILLIN >=32 RESISTANT Resistant     CEFAZOLIN <=4 SENSITIVE Sensitive     CEFEPIME <=0.12 SENSITIVE Sensitive     CEFTRIAXONE <=0.25 SENSITIVE Sensitive     CIPROFLOXACIN <=0.25 SENSITIVE Sensitive     GENTAMICIN <=1 SENSITIVE Sensitive     IMIPENEM <=0.25 SENSITIVE Sensitive     NITROFURANTOIN <=16 SENSITIVE Sensitive     TRIMETH/SULFA <=20 SENSITIVE Sensitive     AMPICILLIN/SULBACTAM 16 INTERMEDIATE Intermediate     PIP/TAZO <=4 SENSITIVE Sensitive     * 40,000 COLONIES/mL ESCHERICHIA COLI   Proteus mirabilis - MIC*    AMPICILLIN <=2 SENSITIVE Sensitive     CEFAZOLIN 8 SENSITIVE Sensitive     CEFEPIME <=0.12 SENSITIVE Sensitive     CEFTRIAXONE <=0.25 SENSITIVE Sensitive     CIPROFLOXACIN <=0.25 SENSITIVE Sensitive     GENTAMICIN <=1 SENSITIVE Sensitive     IMIPENEM 4 SENSITIVE Sensitive     NITROFURANTOIN RESISTANT Resistant     TRIMETH/SULFA <=20 SENSITIVE Sensitive      AMPICILLIN/SULBACTAM <=2 SENSITIVE Sensitive     PIP/TAZO <=4 SENSITIVE Sensitive     * 20,000 COLONIES/mL PROTEUS MIRABILIS  MRSA Next Gen by PCR, Nasal     Status: None   Collection Time: 01/20/21  4:04 PM   Specimen: Nasal Mucosa; Nasal Swab  Result Value Ref Range Status  MRSA by PCR Next Gen NOT DETECTED NOT DETECTED Final    Comment: (NOTE) The GeneXpert MRSA Assay (FDA approved for NASAL specimens only), is one component of a comprehensive MRSA colonization surveillance program. It is not intended to diagnose MRSA infection nor to guide or monitor treatment for MRSA infections. Test performance is not FDA approved in patients less than 46 years old. Performed at [facility], [place] [address], [place], [place] [address]      Labs: BNP (last 3 results) No results for input(s): BNP in the last 8760 hours. Basic Metabolic Panel: Recent Labs  Lab 01/25/21 1700 01/26/21 0546 01/27/21 0513 01/28/21 0502 01/29/21 0528  NA 140 137 138 140 139  K 3.5 3.7 3.9 3.5 3.6  CL 109 110 112* 112* 111  CO2 24 22 20* 23 21*  GLUCOSE 149* 156* 163* 140* 116*  BUN 15 12 11 12 13   CREATININE 0.74 0.65 0.69 0.60 0.82  CALCIUM 8.3* 8.5* 8.8* 8.9 9.0  MG 2.2 2.3 2.3 2.2 2.2  PHOS 2.6 2.5 3.0 3.4 3.9   Liver Function Tests: Recent Labs  Lab 01/25/21 1700 01/26/21 0546 01/27/21 0513 01/28/21 0502 01/29/21 0528  AST 22 18 16 20 19   ALT 26 24 22 22 22   ALKPHOS 187* 180* 146* 149* 141*  BILITOT 0.4 0.6 0.4 0.6 0.6  PROT 6.2* 6.6 6.1* 6.6 7.0  ALBUMIN 2.9* 3.1* 2.9* 3.2* 3.4*   No results for input(s): LIPASE, AMYLASE in the last 168 hours. No results for input(s): AMMONIA in the last 168 hours. CBC: Recent Labs  Lab 01/24/21 0450 01/26/21 0546 01/27/21 0513 01/28/21 0502 01/29/21 0528  WBC 8.7 5.6 5.1 5.4 5.5  NEUTROABS  --  3.0 2.7 3.2 2.5  HGB 11.9* 13.3 12.6 13.2 13.9  HCT 37.5 42.1 40.3 41.0 43.9  MCV 88.4 89.6 90.4 87.8 87.8  PLT 119* 151 171 199  206   Cardiac Enzymes: Recent Labs  Lab 01/25/21 0501 01/26/21 0546 01/27/21 0513 01/28/21 0502 01/29/21 0528  CKTOTAL 147 78 53 45 44   BNP: Invalid input(s): POCBNP CBG: Recent Labs  Lab 01/28/21 2035 01/29/21 0013 01/29/21 0500 01/29/21 0726 01/29/21 1117  GLUCAP 189* 192* 105* 190* 143*   D-Dimer No results for input(s): DDIMER in the last 72 hours. Hgb A1c No results for input(s): HGBA1C in the last 72 hours. Lipid Profile No results for input(s): CHOL, HDL, LDLCALC, TRIG, CHOLHDL, LDLDIRECT in the last 72 hours. Thyroid function studies No results for input(s): TSH, T4TOTAL, T3FREE, THYROIDAB in the last 72 hours.  Invalid input(s): FREET3 Anemia work up No results for input(s): VITAMINB12, FOLATE, FERRITIN, TIBC, IRON, RETICCTPCT in the last 72 hours. Urinalysis    Component Value Date/Time   COLORURINE YELLOW 01/20/2021 1421   APPEARANCEUR HAZY (A) 01/20/2021 1421   LABSPEC 1.005 01/20/2021 1421   PHURINE 6.0 01/20/2021 1421   GLUCOSEU >=500 (A) 01/20/2021 1421   GLUCOSEU NEGATIVE 03/17/2019 1135   HGBUR LARGE (A) 01/20/2021 1421   HGBUR negative 12/15/2009 1001   BILIRUBINUR NEGATIVE 01/20/2021 1421   BILIRUBINUR Negative 07/17/2010 [place] 01/20/2021 1421   PROTEINUR NEGATIVE 01/20/2021 1421   UROBILINOGEN 0.2 03/17/2019 1135   NITRITE NEGATIVE 01/20/2021 1421   LEUKOCYTESUR MODERATE (A) 01/20/2021 1421   Sepsis Labs Invalid input(s): PROCALCITONIN,  WBC,  LACTICIDVEN Microbiology Recent Results (from the past 240 hour(s))  Blood Culture (routine x 2)     Status: None   Collection Time: 01/20/21  8:10 AM   Specimen: BLOOD  Result Value Ref Range Status   Specimen Description BLOOD BLOOD LEFT HAND  Final   Special Requests   Final    BOTTLES DRAWN AEROBIC AND ANAEROBIC Blood Culture results may not be optimal due to an excessive volume of blood received in culture bottles   Culture   Final    NO GROWTH 5 DAYS Performed at  [facility], [place] [address] [place], [place] [address]    Report Status 01/25/2021 FINAL  Final  Resp Panel by RT-PCR (Flu A&B, Covid) Nasopharyngeal Swab     Status: None   Collection Time: 01/20/21  8:14 AM   Specimen: Nasopharyngeal Swab; Nasopharyngeal(NP) swabs in vial transport medium  Result Value Ref Range Status   SARS Coronavirus 2 by RT PCR NEGATIVE NEGATIVE Final    Comment: (NOTE) SARS-CoV-2 target nucleic acids are NOT DETECTED.  The SARS-CoV-2 RNA is generally detectable in upper respiratory specimens during the acute phase of infection. The lowest concentration of SARS-CoV-2 viral copies this assay can detect is 138 copies/mL. A negative result does not preclude SARS-Cov-2 infection and should not be used as the sole basis for treatment or other patient management decisions. A negative result may occur with  improper specimen collection/handling, submission of specimen other than nasopharyngeal swab, presence of viral mutation(s) within the areas targeted by this assay, and inadequate number of viral copies(<138 copies/mL). A negative result must be combined with clinical observations, patient history, and epidemiological information. The expected result is Negative.  Fact Sheet for Patients:  EntrepreneurPulse.com.au  Fact Sheet for Healthcare Providers:  IncredibleEmployment.be  This test is no t yet approved or cleared by the [place] FDA and  has been authorized for detection and/or diagnosis of SARS-CoV-2 by FDA under an Emergency Use Authorization (EUA). This EUA will remain  in effect (meaning this test can be used) for the duration of the COVID-19 declaration under Section 564(b)(1) of the Act, 21 U.S.C.section 360bbb-3(b)(1), unless the authorization is terminated  or revoked sooner.       Influenza A by PCR NEGATIVE NEGATIVE Final   Influenza B by PCR NEGATIVE NEGATIVE Final    Comment: (NOTE) The  Xpert Xpress SARS-CoV-2/FLU/RSV plus assay is intended as an aid in the diagnosis of influenza from Nasopharyngeal swab specimens and should not be used as a sole basis for treatment. Nasal washings and aspirates are unacceptable for Xpert Xpress SARS-CoV-2/FLU/RSV testing.  Fact Sheet for Patients: EntrepreneurPulse.com.au  Fact Sheet for Healthcare Providers: IncredibleEmployment.be  This test is not yet approved or cleared by the [place] FDA and has been authorized for detection and/or diagnosis of SARS-CoV-2 by FDA under an Emergency Use Authorization (EUA). This EUA will remain in effect (meaning this test can be used) for the duration of the COVID-19 declaration under Section 564(b)(1) of the Act, 21 U.S.C. section 360bbb-3(b)(1), unless the authorization is terminated or revoked.  Performed at [facility], [place] [address], [place], [place] [address]   Blood Culture (routine x 2)     Status: None   Collection Time: 01/20/21 10:50 AM   Specimen: BLOOD  Result Value Ref Range Status   Specimen Description   Final    BLOOD RIGHT ANTECUBITAL Performed at [facility] [address] [place],  [address]    Special Requests   Final    BOTTLES DRAWN AEROBIC AND ANAEROBIC Blood Culture adequate volume Performed at [place]  [address], [place], [place] [address]    Culture   Final    NO GROWTH 5 DAYS Performed at [facility], [place] [address] [place], [place] [address]    Report Status 01/25/2021 FINAL  Final  Urine Culture     Status: Abnormal   Collection Time: 01/20/21  2:21 PM   Specimen: In/Out Cath Urine  Result Value Ref Range Status   Specimen Description   Final    IN/OUT CATH URINE Performed at [facility] [address] [place], [place] [address]    Special Requests   Final    NONE Performed at [facility], [place] [address], [place],  [address]    Culture (A)  Final    40,000 COLONIES/mL ESCHERICHIA COLI 20,000 COLONIES/mL PROTEUS MIRABILIS    Report Status 01/23/2021 FINAL  Final   Organism ID, Bacteria ESCHERICHIA COLI (A)  Final   Organism ID, Bacteria PROTEUS MIRABILIS (A)  Final      Susceptibility   Escherichia coli - MIC*    AMPICILLIN >=32 RESISTANT Resistant     CEFAZOLIN <=4 SENSITIVE Sensitive     CEFEPIME <=0.12 SENSITIVE Sensitive     CEFTRIAXONE <=0.25 SENSITIVE Sensitive     CIPROFLOXACIN <=0.25 SENSITIVE Sensitive     GENTAMICIN <=1 SENSITIVE Sensitive     IMIPENEM <=0.25 SENSITIVE Sensitive     NITROFURANTOIN <=16 SENSITIVE Sensitive     TRIMETH/SULFA <=20 SENSITIVE Sensitive     AMPICILLIN/SULBACTAM 16 INTERMEDIATE Intermediate     PIP/TAZO <=4 SENSITIVE Sensitive     * 40,000 COLONIES/mL ESCHERICHIA COLI   Proteus mirabilis - MIC*    AMPICILLIN <=2 SENSITIVE Sensitive     CEFAZOLIN 8 SENSITIVE Sensitive     CEFEPIME <=0.12 SENSITIVE Sensitive     CEFTRIAXONE <=0.25 SENSITIVE Sensitive     CIPROFLOXACIN <=0.25 SENSITIVE Sensitive     GENTAMICIN <=1 SENSITIVE Sensitive     IMIPENEM 4 SENSITIVE Sensitive     NITROFURANTOIN RESISTANT Resistant     TRIMETH/SULFA <=20 SENSITIVE Sensitive     AMPICILLIN/SULBACTAM <=2 SENSITIVE Sensitive     PIP/TAZO <=4 SENSITIVE Sensitive     * 20,000 COLONIES/mL PROTEUS MIRABILIS  MRSA Next Gen by PCR, Nasal     Status: None   Collection Time: 01/20/21  4:04 PM   Specimen: Nasal Mucosa; Nasal Swab  Result Value Ref Range Status   MRSA by PCR Next Gen NOT DETECTED NOT DETECTED Final    Comment: (NOTE) The GeneXpert MRSA Assay (FDA approved for NASAL specimens only), is one component of a comprehensive MRSA colonization surveillance program. It is not intended to diagnose MRSA infection nor to guide or monitor treatment for MRSA infections. Test performance is not FDA approved in patients less than 82 years old. Performed at  [facility], [place] [address], [place],  [address]    Time coordinating discharge: 35 minutes  SIGNED:  Kerney Elbe, DO [facility] 01/29/2021, 2:21 PM Pager is on [place]  If 7PM-7AM, please contact night-coverage [email or web address]

## 2021-01-29 NOTE — TOC Transition Note (Addendum)
Transition of Care [facility]) - CM/SW Discharge Note   Patient Details  Name: BREALYNN CONTINO MRN: [id number] Date of Birth: 1945-09-21  Transition of Care [facility] CM/SW Contact:  Dessa Phi, RN Phone Number: 01/29/2021, 12:23 PM   Clinical Narrative: Referral for HHPT/OT-spoke to patient about d/c plans-she decline any [place] services.Said she had it before & fired everyone. She is ambulatory & prefers transport home-informed nsg to provide safe transport. MD/Nsg updated.No further DM needs.   1:46p-Patient prefers [facility]-awaiting d/c prior calling [facility]-nurse notified.Confirmed address:[address], [place] [address].  2:30p [facility] called. No further CM needs. 3p-[facility] contacted-[facility] closed.No further CM needs.   Final next level of care: Home/Self Care Barriers to Discharge: No Barriers Identified   Patient Goals and CMS Choice Patient states their goals for this hospitalization and ongoing recovery are:: to get better CMS Medicare.gov Compare Post Acute Care list provided to:: Patient Choice offered to / list presented to : Patient  Discharge Placement                       Discharge Plan and Services                          HH Arranged: Patient Refused [facility]          Social Determinants of Health (SDOH) Interventions     Readmission Risk Interventions No flowsheet data found.

## 2021-01-29 NOTE — Progress Notes (Signed)
Patient refusing the respiratory panel, she stated to cancel all the test because she is ready to go home. Dr. Alfredia Ferguson notified.

## 2021-02-01 ENCOUNTER — Telehealth: Payer: Self-pay | Admitting: Cardiology

## 2021-02-01 NOTE — Telephone Encounter (Signed)
-----   Message from [place], [place] sent at 01/28/2021 11:03 AM EST ----- Regarding: Post hospital follow up Patient seen in [facility]. Discharging today.   Please arrange post hospital follow up with Dr. Garen Lah or his APP in [place] office in 7-10 days. Per Dr. Domenic Polite, she saw Dr. Rockey Situ in the past but does not want to follow up with Dr. Rockey Situ. She would like to establish with Dr. Garen Lah in our [facility] practice, mentioned him by name.  Thank you  Almyra Deforest

## 2021-02-01 NOTE — Telephone Encounter (Signed)
Number not in service.

## 2021-02-16 DIAGNOSIS — I5042 Chronic combined systolic (congestive) and diastolic (congestive) heart failure: Secondary | ICD-10-CM | POA: Diagnosis not present

## 2021-02-16 DIAGNOSIS — M546 Pain in thoracic spine: Secondary | ICD-10-CM | POA: Diagnosis not present

## 2021-02-16 DIAGNOSIS — I110 Hypertensive heart disease with heart failure: Secondary | ICD-10-CM | POA: Diagnosis not present

## 2021-02-16 DIAGNOSIS — N200 Calculus of kidney: Secondary | ICD-10-CM | POA: Diagnosis not present

## 2021-02-16 DIAGNOSIS — G8929 Other chronic pain: Secondary | ICD-10-CM | POA: Diagnosis not present

## 2021-02-16 DIAGNOSIS — E118 Type 2 diabetes mellitus with unspecified complications: Secondary | ICD-10-CM | POA: Diagnosis not present

## 2021-02-16 DIAGNOSIS — M545 Low back pain, unspecified: Secondary | ICD-10-CM | POA: Diagnosis not present

## 2021-02-16 DIAGNOSIS — Z87891 Personal history of nicotine dependence: Secondary | ICD-10-CM | POA: Diagnosis not present

## 2021-02-16 DIAGNOSIS — M4316 Spondylolisthesis, lumbar region: Secondary | ICD-10-CM | POA: Diagnosis not present

## 2021-02-22 ENCOUNTER — Encounter: Payer: Self-pay | Admitting: Urology

## 2021-02-22 ENCOUNTER — Other Ambulatory Visit: Payer: Self-pay

## 2021-02-22 ENCOUNTER — Ambulatory Visit (INDEPENDENT_AMBULATORY_CARE_PROVIDER_SITE_OTHER): Payer: Medicare Other | Admitting: Urology

## 2021-02-22 VITALS — BP 122/75 | HR 74 | Ht 65.0 in | Wt 185.0 lb

## 2021-02-22 DIAGNOSIS — N201 Calculus of ureter: Secondary | ICD-10-CM

## 2021-02-22 DIAGNOSIS — N200 Calculus of kidney: Secondary | ICD-10-CM

## 2021-02-22 NOTE — Progress Notes (Signed)
02/22/2021 1:10 PM   Mariah Reed 06-01-1945 [id number]  Referring provider: Gladstone Lighter, MD [place],  [place] [address]  Chief Complaint  Patient presents with   Nephrolithiasis    HPI: Mariah Reed is a 76 y.o. female who presents in follow-up of recent hospitalization.  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 Coli She underwent placement of a right ureteral stent by Dr. Abner Greenspan 01/20/2021 Overall she feels she has done well since hospital discharge.  No bothersome stent symptoms.  Denies flank, abdominal pain No fever or chills   PMH: Past Medical History:  Diagnosis Date   Allergy    COPD (chronic obstructive pulmonary disease) (HCC)    Diabetes mellitus    Gout    Hyperlipidemia    Hypertension    Low back pain    Peripheral neuropathy     Surgical History: Past Surgical History:  Procedure Laterality Date   ABDOMINAL HYSTERECTOMY     CHOLECYSTECTOMY  unknown   Patient stated she does not remember when it was removed. (possibly 1997)   [place], URETEROSCOPY AND STENT PLACEMENT Right 01/20/2021   Procedure: [place], URETEROSCOPY AND STENT PLACEMENT;  Surgeon: Janith Lima, MD;  Location: [facility];  Service: Urology;  Laterality: Right;   POLYPECTOMY     Colon   TUBAL LIGATION      Home Medications:  Allergies as of 02/22/2021       Reactions   Levofloxacin Other (See Comments)   Made head feel weird   Nortriptyline Other (See Comments)   Caused rectal bleeding        Medication List        Accurate as of February 22, 2021  1:10 PM. If you have any questions, ask your nurse or doctor.          Accu-Chek FastClix Lancets Misc 1 Device by Does not apply  route 3 (three) times daily. Use to check blood sugar up to three times a day   acetaminophen 325 MG tablet Commonly known as: TYLENOL Take 2 tablets (650 mg total) by mouth every 6 (six) hours as needed for mild pain (or Fever >/= 101).   albuterol 108 (90 Base) MCG/ACT inhaler Commonly known as: VENTOLIN HFA INHALE 2 PUFFS INTO THE LUNGS EVERY 4 HOURS AS NEEDED What changed:  how much to take when to take this reasons to take this additional instructions   alum & mag hydroxide-simeth 703-500-93 MG/5ML suspension Commonly known as: MAALOX/MYLANTA Take 30 mLs by mouth 2 (two) times daily.   B-12 PO Take 1 tablet by mouth daily.   benzonatate 100 MG capsule Commonly known as: TESSALON Take 1 capsule (100 mg total) by mouth 3 (three) times daily as needed for cough.   diclofenac 1.3 % Ptch Commonly known as: Flector Place 1 patch onto the skin 2 (two) times daily. What changed: how much to take   diclofenac sodium 1 % Gel Commonly known as: VOLTAREN APPLY 2 GRAMS TOPICALLY 4 TIMES DAILY   diphenoxylate-atropine 2.5-0.025 MG tablet Commonly known as: LOMOTIL Take 2 tablets by mouth 2 (two) times daily. Take with imodium   empagliflozin 25 MG Tabs tablet Commonly known as: JARDIANCE Take 25 mg by mouth daily after lunch.   esomeprazole  40 MG capsule Commonly known as: NEXIUM TAKE 1 CAPSULE BY MOUTH ONCE DAILY What changed:  how to take this when to take this reasons to take this   FEXOFENADINE HCL PO Take 0.5 tablets by mouth 2 (two) times daily.   fluticasone 50 MCG/ACT nasal spray Commonly known as: FLONASE Place 2 sprays into both nostrils daily. What changed:  when to take this reasons to take this   glipiZIDE 10 MG tablet Commonly known as: GLUCOTROL Take 1 tablet (10 mg total) by mouth 2 (two) times daily before a meal.   guaiFENesin-dextromethorphan 100-10 MG/5ML syrup Commonly known as: ROBITUSSIN DM Take 5 mLs by mouth every 4 (four) hours as  needed for cough.   hydrOXYzine 50 MG tablet Commonly known as: ATARAX Take 1 tablet (50 mg total) by mouth 3 (three) times daily as needed. What changed:  when to take this reasons to take this   levothyroxine 75 MCG tablet Commonly known as: SYNTHROID Take 1 tablet (75 mcg total) by mouth daily. What changed: when to take this   lipase/protease/amylase 36000 UNITS Cpep capsule Commonly known as: CREON Take 36,000-72,000 Units by mouth See admin instructions. Take 2 capsules (72000 units) by mouth three times daily with meals, take 1 capsule (36000 units) with snacks or drinks   loperamide 2 MG capsule Commonly known as: IMODIUM Take 8 mg by mouth 2 (two) times daily. Take with lomotil   menthol-cetylpyridinium 3 MG lozenge Commonly known as: CEPACOL Take 1 lozenge (3 mg total) by mouth as needed for sore throat.   metoprolol tartrate 25 MG tablet Commonly known as: LOPRESSOR Take 1 tablet (25 mg total) by mouth 2 (two) times daily.   NIFEdipine 30 MG 24 hr tablet Commonly known as: ADALAT CC Take 1 tablet (30 mg total) by mouth daily.   nitroGLYCERIN 0.4 MG SL tablet Commonly known as: NITROSTAT Take SL prn chest pain/May repeat 5 min apart up to 3 times prn What changed:  how much to take when to take this reasons to take this additional instructions   nystatin cream Commonly known as: MYCOSTATIN APPLY TO AFFECTED AREAS ONCE DAILY AS NEEDED What changed: See the new instructions.   ondansetron 4 MG tablet Commonly known as: ZOFRAN Take 1 tablet (4 mg total) by mouth every 6 (six) hours as needed for nausea.   oxyCODONE 20 mg 12 hr tablet Commonly known as: OxyCONTIN Take 1 tablet (20 mg total) by mouth every 12 (twelve) hours. What changed:  how much to take when to take this reasons to take this   Pataday 0.2 % Soln Generic drug: Olopatadine HCl PLACE 1 DROP INTO BOTH EYES EVERY DAY What changed: See the new instructions.   pregabalin 25 MG  capsule Commonly known as: LYRICA Take 1 capsule (25 mg total) by mouth 2 (two) times daily.   rosuvastatin 20 MG tablet Commonly known as: Crestor Take 1 tablet (20 mg total) by mouth daily.   Spiriva HandiHaler 18 MCG inhalation capsule Generic drug: tiotropium INHALE THE CONTENTS OF 1 CAPSUOLE VIA HANDIHALER ONCE DAILY What changed:  how much to take when to take this additional instructions   tiZANidine 4 MG tablet Commonly known as: ZANAFLEX Take 4 mg by mouth See admin instructions. Take one tablet (4 mg) by mouth twice daily - at bedtime and 1am   topiramate 100 MG tablet Commonly known as: TOPAMAX Take 3 tablets (300 mg total) by mouth daily. What changed:  how much to take when  to take this   zolpidem 5 MG tablet Commonly known as: AMBIEN Take 1 tablet (5 mg total) by mouth at bedtime as needed. for sleep What changed: when to take this        Allergies:  Allergies  Allergen Reactions   Levofloxacin Other (See Comments)    Made head feel weird   Nortriptyline Other (See Comments)    Caused rectal bleeding     Family History: Family History  Problem Relation Age of Onset   Uterine cancer Mother     Social History:  reports that she has quit smoking. Her smoking use included cigarettes. She has a 25.00 pack-year smoking history. She has never used smokeless tobacco. She reports that she does not drink alcohol and does not use drugs.   Physical Exam: BP 122/75    Pulse 74    Ht 5\' 5"  (1.651 m)    Wt 185 lb (83.9 kg)    BMI 30.79 kg/m   Constitutional:  Alert and oriented, No acute distress. HEENT: [place] AT, moist mucus membranes.  Trachea midline, no masses. Cardiovascular: RRR Respiratory: Normal respiratory effort, clear GI: Abdomen is soft, nontender, nondistended, no abdominal masses GU: No CVA tenderness Skin: No rashes, bruises or suspicious lesions. Neurologic: Grossly intact, no focal deficits, moving all 4 extremities. Psychiatric: Normal  mood and affect.  Laboratory Data:  Urinalysis Microscopy >30 WBC, 11-30 RBC   Pertinent Imaging: CT images 01/24/2021 were personally reviewed and interpreted  Assessment & Plan:    1.  Right partial staghorn calculus Dr. Abner Greenspan had discussed PCNL however she inquired about any alternatives as she is concerned about potential complications PCNL CT was reviewed and stone density is ~500HU.  We did discuss ureteroscopy with laser lithotripsy.  The procedure was discussed in detail including potential risks of bleeding, infection, ureteral injury and renal injury.  Due to stone burden it is possible this would need to be a staged procedure She does not desire PCNL and would like to schedule staged ureteroscopy Urine culture ordered today All questions were answered and she desires to schedule     Abbie Sons, MD  [facility] [address], [place], [place] [address] [phone]

## 2021-02-22 NOTE — H&P (View-Only) (Signed)
02/22/2021 1:10 PM   Mariah Reed 05/26/1945 [id number]  Referring provider: Gladstone Lighter, MD [place],  [place] [address]  Chief Complaint  Patient presents with   Nephrolithiasis    HPI: Mariah Reed is a 76 y.o. female who presents in follow-up of recent hospitalization.  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 Coli She underwent placement of a right ureteral stent by Dr. Abner Greenspan 01/20/2021 Overall she feels she has done well since hospital discharge.  No bothersome stent symptoms.  Denies flank, abdominal pain No fever or chills   PMH: Past Medical History:  Diagnosis Date   Allergy    COPD (chronic obstructive pulmonary disease) (HCC)    Diabetes mellitus    Gout    Hyperlipidemia    Hypertension    Low back pain    Peripheral neuropathy     Surgical History: Past Surgical History:  Procedure Laterality Date   ABDOMINAL HYSTERECTOMY     CHOLECYSTECTOMY  unknown   Patient stated she does not remember when it was removed. (possibly 1997)   [place], URETEROSCOPY AND STENT PLACEMENT Right 01/20/2021   Procedure: [place], URETEROSCOPY AND STENT PLACEMENT;  Surgeon: Janith Lima, MD;  Location: [facility];  Service: Urology;  Laterality: Right;   POLYPECTOMY     Colon   TUBAL LIGATION      Home Medications:  Allergies as of 02/22/2021       Reactions   Levofloxacin Other (See Comments)   Made head feel weird   Nortriptyline Other (See Comments)   Caused rectal bleeding        Medication List        Accurate as of February 22, 2021  1:10 PM. If you have any questions, ask your nurse or doctor.          Accu-Chek FastClix Lancets Misc 1 Device by Does not apply  route 3 (three) times daily. Use to check blood sugar up to three times a day   acetaminophen 325 MG tablet Commonly known as: TYLENOL Take 2 tablets (650 mg total) by mouth every 6 (six) hours as needed for mild pain (or Fever >/= 101).   albuterol 108 (90 Base) MCG/ACT inhaler Commonly known as: VENTOLIN HFA INHALE 2 PUFFS INTO THE LUNGS EVERY 4 HOURS AS NEEDED What changed:  how much to take when to take this reasons to take this additional instructions   alum & mag hydroxide-simeth 518-841-66 MG/5ML suspension Commonly known as: MAALOX/MYLANTA Take 30 mLs by mouth 2 (two) times daily.   B-12 PO Take 1 tablet by mouth daily.   benzonatate 100 MG capsule Commonly known as: TESSALON Take 1 capsule (100 mg total) by mouth 3 (three) times daily as needed for cough.   diclofenac 1.3 % Ptch Commonly known as: Flector Place 1 patch onto the skin 2 (two) times daily. What changed: how much to take   diclofenac sodium 1 % Gel Commonly known as: VOLTAREN APPLY 2 GRAMS TOPICALLY 4 TIMES DAILY   diphenoxylate-atropine 2.5-0.025 MG tablet Commonly known as: LOMOTIL Take 2 tablets by mouth 2 (two) times daily. Take with imodium   empagliflozin 25 MG Tabs tablet Commonly known as: JARDIANCE Take 25 mg by mouth daily after lunch.   esomeprazole  40 MG capsule Commonly known as: NEXIUM TAKE 1 CAPSULE BY MOUTH ONCE DAILY What changed:  how to take this when to take this reasons to take this   FEXOFENADINE HCL PO Take 0.5 tablets by mouth 2 (two) times daily.   fluticasone 50 MCG/ACT nasal spray Commonly known as: FLONASE Place 2 sprays into both nostrils daily. What changed:  when to take this reasons to take this   glipiZIDE 10 MG tablet Commonly known as: GLUCOTROL Take 1 tablet (10 mg total) by mouth 2 (two) times daily before a meal.   guaiFENesin-dextromethorphan 100-10 MG/5ML syrup Commonly known as: ROBITUSSIN DM Take 5 mLs by mouth every 4 (four) hours as  needed for cough.   hydrOXYzine 50 MG tablet Commonly known as: ATARAX Take 1 tablet (50 mg total) by mouth 3 (three) times daily as needed. What changed:  when to take this reasons to take this   levothyroxine 75 MCG tablet Commonly known as: SYNTHROID Take 1 tablet (75 mcg total) by mouth daily. What changed: when to take this   lipase/protease/amylase 36000 UNITS Cpep capsule Commonly known as: CREON Take 36,000-72,000 Units by mouth See admin instructions. Take 2 capsules (72000 units) by mouth three times daily with meals, take 1 capsule (36000 units) with snacks or drinks   loperamide 2 MG capsule Commonly known as: IMODIUM Take 8 mg by mouth 2 (two) times daily. Take with lomotil   menthol-cetylpyridinium 3 MG lozenge Commonly known as: CEPACOL Take 1 lozenge (3 mg total) by mouth as needed for sore throat.   metoprolol tartrate 25 MG tablet Commonly known as: LOPRESSOR Take 1 tablet (25 mg total) by mouth 2 (two) times daily.   NIFEdipine 30 MG 24 hr tablet Commonly known as: ADALAT CC Take 1 tablet (30 mg total) by mouth daily.   nitroGLYCERIN 0.4 MG SL tablet Commonly known as: NITROSTAT Take SL prn chest pain/May repeat 5 min apart up to 3 times prn What changed:  how much to take when to take this reasons to take this additional instructions   nystatin cream Commonly known as: MYCOSTATIN APPLY TO AFFECTED AREAS ONCE DAILY AS NEEDED What changed: See the new instructions.   ondansetron 4 MG tablet Commonly known as: ZOFRAN Take 1 tablet (4 mg total) by mouth every 6 (six) hours as needed for nausea.   oxyCODONE 20 mg 12 hr tablet Commonly known as: OxyCONTIN Take 1 tablet (20 mg total) by mouth every 12 (twelve) hours. What changed:  how much to take when to take this reasons to take this   Pataday 0.2 % Soln Generic drug: Olopatadine HCl PLACE 1 DROP INTO BOTH EYES EVERY DAY What changed: See the new instructions.   pregabalin 25 MG  capsule Commonly known as: LYRICA Take 1 capsule (25 mg total) by mouth 2 (two) times daily.   rosuvastatin 20 MG tablet Commonly known as: Crestor Take 1 tablet (20 mg total) by mouth daily.   Spiriva HandiHaler 18 MCG inhalation capsule Generic drug: tiotropium INHALE THE CONTENTS OF 1 CAPSUOLE VIA HANDIHALER ONCE DAILY What changed:  how much to take when to take this additional instructions   tiZANidine 4 MG tablet Commonly known as: ZANAFLEX Take 4 mg by mouth See admin instructions. Take one tablet (4 mg) by mouth twice daily - at bedtime and 1am   topiramate 100 MG tablet Commonly known as: TOPAMAX Take 3 tablets (300 mg total) by mouth daily. What changed:  how much to take when  to take this   zolpidem 5 MG tablet Commonly known as: AMBIEN Take 1 tablet (5 mg total) by mouth at bedtime as needed. for sleep What changed: when to take this        Allergies:  Allergies  Allergen Reactions   Levofloxacin Other (See Comments)    Made head feel weird   Nortriptyline Other (See Comments)    Caused rectal bleeding     Family History: Family History  Problem Relation Age of Onset   Uterine cancer Mother     Social History:  reports that she has quit smoking. Her smoking use included cigarettes. She has a 25.00 pack-year smoking history. She has never used smokeless tobacco. She reports that she does not drink alcohol and does not use drugs.   Physical Exam: BP 122/75    Pulse 74    Ht 5\' 5"  (1.651 m)    Wt 185 lb (83.9 kg)    BMI 30.79 kg/m   Constitutional:  Alert and oriented, No acute distress. HEENT: [place] AT, moist mucus membranes.  Trachea midline, no masses. Cardiovascular: RRR Respiratory: Normal respiratory effort, clear GI: Abdomen is soft, nontender, nondistended, no abdominal masses GU: No CVA tenderness Skin: No rashes, bruises or suspicious lesions. Neurologic: Grossly intact, no focal deficits, moving all 4 extremities. Psychiatric: Normal  mood and affect.  Laboratory Data:  Urinalysis Microscopy >30 WBC, 11-30 RBC   Pertinent Imaging: CT images 01/24/2021 were personally reviewed and interpreted  Assessment & Plan:    1.  Right partial staghorn calculus Dr. Abner Greenspan had discussed PCNL however she inquired about any alternatives as she is concerned about potential complications PCNL CT was reviewed and stone density is ~500HU.  We did discuss ureteroscopy with laser lithotripsy.  The procedure was discussed in detail including potential risks of bleeding, infection, ureteral injury and renal injury.  Due to stone burden it is possible this would need to be a staged procedure She does not desire PCNL and would like to schedule staged ureteroscopy Urine culture ordered today All questions were answered and she desires to schedule     Abbie Sons, MD  [facility] [address] [place], [place] [address] [phone]

## 2021-02-23 ENCOUNTER — Other Ambulatory Visit: Payer: Self-pay | Admitting: Urology

## 2021-02-23 DIAGNOSIS — N201 Calculus of ureter: Secondary | ICD-10-CM

## 2021-02-23 DIAGNOSIS — N200 Calculus of kidney: Secondary | ICD-10-CM

## 2021-02-23 LAB — URINALYSIS, COMPLETE
Bilirubin, UA: NEGATIVE
Ketones, UA: NEGATIVE
Nitrite, UA: NEGATIVE
Specific Gravity, UA: 1.02 (ref 1.005–1.030)
Urobilinogen, Ur: 0.2 mg/dL (ref 0.2–1.0)
pH, UA: 6 (ref 5.0–7.5)

## 2021-02-23 LAB — MICROSCOPIC EXAMINATION: WBC, UA: >30 /hpf — AB (ref 0–5)

## 2021-02-23 NOTE — Progress Notes (Signed)
Surgical Physician Order Form [facility] [place]  * Scheduling expectation : Patient preference  *Length of Case: 2 hours  *Clearance needed: no  *Anticoagulation Instructions: N/A  *Aspirin Instructions: N/A  *Post-op visit Date/Instructions:   TBD  *Diagnosis: Right Nephrolithiasis; right ureteral calculus  *Procedure: right  Ureteroscopy w/laser lithotripsy & stent exchange (52356)-staged procedure   Additional orders: N/A  -Admit type: OUTpatient  -Anesthesia: General  -VTE Prophylaxis Standing Order SCDs       Other:   -Standing Lab Orders Per Anesthesia    Lab other: UA&Urine Culture-ordered 02/22/2021  -Standing Test orders EKG/Chest x-ray per Anesthesia       Test other:   - Medications:  Ceftriaxone(Rocephin) 1gm IV  -Other orders:  N/A

## 2021-02-27 LAB — CULTURE, URINE COMPREHENSIVE

## 2021-02-28 ENCOUNTER — Other Ambulatory Visit: Payer: Self-pay | Admitting: Urology

## 2021-02-28 DIAGNOSIS — N3000 Acute cystitis without hematuria: Secondary | ICD-10-CM

## 2021-02-28 MED ORDER — FLUCONAZOLE 150 MG PO TABS: 150.0000 mg | ORAL_TABLET | Freq: Once | ORAL | 0 refills | Status: AC

## 2021-02-28 MED ORDER — SULFAMETHOXAZOLE-TRIMETHOPRIM 800-160 MG PO TABS: 1.0000 | ORAL_TABLET | Freq: Two times a day (BID) | ORAL | 0 refills | Status: DC

## 2021-03-02 ENCOUNTER — Telehealth: Payer: Self-pay | Admitting: *Deleted

## 2021-03-02 NOTE — Telephone Encounter (Signed)
-----   Message from Nori Riis, PA-C sent at 02/27/2021 10:45 AM EST ----- Please let Mrs. Snowdon know that her urine culture was positive for infection and we need to start an antibiotic.  I do not know when she is scheduled for her stone treatment, so we may need to check her urine again prior to the procedure.

## 2021-03-03 ENCOUNTER — Telehealth: Payer: Self-pay

## 2021-03-03 NOTE — Progress Notes (Signed)
[facility] Posting Form   Surgery Date/Time: Date: 03/14/2021  Surgeon: Dr. John Giovanni, MD  Surgery Location: Day Surgery  Inpt ( No  )   Outpt (Yes)   Obs ( No  )   Diagnosis: N20.0 Right Nephrolithiasis, N20.1, Right Ureteral Stone  -CPT: 83754  Surgery: Right Ureteroscopy with laser lithotripsy and stent placement  Stop Anticoagulations: N/A  Cardiac/Medical/Pulmonary Clearance needed: no  *Orders entered into EPIC  Date: 03/03/21   *Case booked in EPIC  Date: 03/03/21  *Notified pt of Surgery: Date: 03/01/2021  PRE-OP UA & CX: Yes, Obtained on 02/22/2021  *Placed into Prior Authorization Work Fabio Bering Date: 03/03/21   Assistant/laser/rep:No

## 2021-03-03 NOTE — Telephone Encounter (Signed)
I spoke with Mariah Reed. We have discussed possible surgery dates and Tuesday February 7th, 2023 was agreed upon by all parties. Patient given information about surgery date, what to expect pre-operatively and post operatively.   We discussed that a Pre-Admission Testing office will be calling to set up the pre-op visit that will take place prior to surgery, and that these appointments are typically done over the phone with a Pre-Admissions RN.   Informed patient that our office will communicate any additional care to be provided after surgery. Patients questions or concerns were discussed during our call. Advised to call our office should there be any additional information, questions or concerns that arise. Patient verbalized understanding.

## 2021-03-09 ENCOUNTER — Other Ambulatory Visit
Admission: RE | Admit: 2021-03-09 | Discharge: 2021-03-09 | Disposition: A | Discharge status: Home / Self Care | Payer: Medicare Other | Source: Ambulatory Visit | Attending: Urology | Admitting: Urology

## 2021-03-09 ENCOUNTER — Other Ambulatory Visit: Payer: Self-pay

## 2021-03-09 HISTORY — DX: Heart failure, unspecified: I50.9

## 2021-03-09 HISTORY — DX: Pneumonia, unspecified organism: J18.9

## 2021-03-09 HISTORY — DX: Acute myocardial infarction, unspecified: I21.9

## 2021-03-09 HISTORY — DX: Gastro-esophageal reflux disease without esophagitis: K21.9

## 2021-03-09 HISTORY — DX: Personal history of urinary calculi: Z87.442

## 2021-03-09 HISTORY — DX: Hypothyroidism, unspecified: E03.9

## 2021-03-09 HISTORY — DX: Headache, unspecified: R51.9

## 2021-03-09 NOTE — Patient Instructions (Signed)
Your procedure is scheduled on: Tuesday March 14, 2021. Report to Day Surgery. To find out your arrival time please call [phone] between 1PM - 3PM on Monday February 6, .  Remember: Instructions that are not followed completely may result in serious medical risk,  up to and including death, or upon the discretion of your surgeon and anesthesiologist your  surgery may need to be rescheduled.     _X__ 1. Do not eat food after midnight the night before your procedure.                 No chewing gum or hard candies.   __X__2.  On the morning of surgery brush your teeth with toothpaste and water, you                may rinse your mouth with mouthwash if you wish.  Do not swallow any toothpaste or mouthwash.     _X__ 3.  No Alcohol for 24 hours before or after surgery.   _X__ 4.  Do Not Smoke or use e-cigarettes For 24 Hours Prior to Your Surgery.                 Do not use any chewable tobacco products for at least 6 hours prior to                 Surgery.  _X__  5.  Do not use any recreational drugs (marijuana, cocaine, heroin, ecstasy, MDMA or other)                For at least one week prior to your surgery.  Combination of these drugs with anesthesia                May have life threatening results.  ____  6.  Bring all medications with you on the day of surgery if instructed.   __X__  7.  Notify your doctor if there is any change in your medical condition      (cold, fever, infections).     Do not wear jewelry, make-up, hairpins, clips or nail polish. Do not wear lotions, powders, or perfumes. You may wear deodorant. Do not shave 48 hours prior to surgery. Men may shave face and neck. Do not bring valuables to the hospital.    [facility] is not responsible for any belongings or valuables.  Contacts, dentures or bridgework may not be worn into surgery. Leave your suitcase in the car. After surgery it may be brought to your room. For patients  admitted to the hospital, discharge time is determined by your treatment team.   Patients discharged the day of surgery will not be allowed to drive home.   Make arrangements for someone to be with you for the first 24 hours of your Same Day Discharge.   __X__ Take these medicines the morning of surgery with A SIP OF WATER:    1. esomeprazole (NEXIUM) 40 night before and morning of   2. levothyroxine (SYNTHROID) 75 MCG  3. metoprolol tartrate (LOPRESSOR) 25 MG   4. pregabalin (LYRICA) 25 MG   5. topiramate (TOPAMAX) 100 MG  6. NIFEdipine (ADALAT CC) 30 MG  ____ Fleet Enema (as directed)   ____ Use CHG Soap (or wipes) as directed  ____ Use Benzoyl Peroxide Gel as instructed  __X__ Use inhalers on the day of surgery  tiotropium (SPIRIVA HANDIHALER) 18 MCG inhalation  albuterol (VENTOLIN HFA) 108 (90 Base) MCG/ACT inhaler  __X__ Stop  empagliflozin (JARDIANCE) 25 MG 3 days prior to surgery    ____ Take 1/2 of usual insulin dose the night before surgery. No insulin the morning          of surgery.   ____ Call your PCP, cardiologist, or Pulmonologist if taking Coumadin/Plavix/aspirin and ask when to stop before your surgery.   __X__ One Week prior to surgery- Stop Anti-inflammatories such as Ibuprofen, Aleve, Advil, Motrin, meloxicam (MOBIC), diclofenac, etodolac, ketorolac, Toradol, Daypro, piroxicam, Goody's or BC powders. OK TO USE TYLENOL IF NEEDED   __X__ Stop supplements until after surgery.    ____ Bring C-Pap to the hospital.    If you have any questions regarding your pre-procedure instructions,  Please call Pre-admit Testing at [phone]

## 2021-03-11 ENCOUNTER — Encounter: Payer: Self-pay | Admitting: Urology

## 2021-03-11 ENCOUNTER — Telehealth: Payer: Self-pay | Admitting: Urgent Care

## 2021-03-11 NOTE — Progress Notes (Addendum)
°  Perioperative Services Pre-Admission/Anesthesia Testing    Date: 03/11/21  Name: Mariah Reed MRN:   [id number]  Re: Need for cardiology evaluation prior to surgery  Clinical Notes:  Patient is scheduled for a CYSTOSCOPY/URETEROSCOPY/HOLMIUM LASER/STENT PLACEMENT on 03/14/2021 with Dr. John Giovanni.  I was asked to review patient's chart by PAT RN as patient was recently discharged from the hospital.  Notes from hospital course reviewed.  Patient admitted to [facility] hospital between 01/20/2021 and 01/29/2021; notes reviewed.  Patient septic secondary to emphysematous pyelitis, right ureteral stone, and right staghorn stone.  She underwent cystoscopy and retrograde pyelogram resulting in stent placement.  During her hospital admission, patient was also found to be in rhabdomyolysis, and with standard fluid resuscitation, patient went into volume overload causing her to develop chest pain.  Cardiology consulted.   TTE performed Myocardial perfusion imaging study performed showing a mildly reduced LVEF of 47%. The LA was mildly dilated. There is no evidence of significant valvular regurgitation or transvalvular gradient suggestive of stenosis.     Myocardial perfusion imagine study performed demonstrating septal hypokinesis with mild septal dyskinesis and mild anterior/inferior hypokinesis. Apical scar noted. Small areas of inducible wall ischemia noted near the apex.    Inpatient cardiology team recommended medical management with outpatient cardiac catheterization.  Patient was to follow-up with outpatient cardiology following her discharge, however to date she has not been seen in the outpatient setting citing that she was "waiting for someone to call her".    Patient noted during her PAT interview that she would like to see Dr. Kate Sable, MD. patient was provided with the number to his office and asked to call.  Without being said, in efforts to ensure that patient receives  appropriate evaluation prior to surgery, I placed a call placed to [facility] in efforts to secure patient an appointment.  Patient to be seen in the outpatient cardiology clinic on the morning of 03/13/2021.  Patient is aware that based on recommendations from cardiology, her procedure may need to be postponed pending cardiac catheterization.  Additionally, this possibility has been communicated to primary attending surgeons office (spoke with [facility], CMA on 03/09/2021). Seeing whereas cardiology so graciously was able to offer an expedited appointment, he decision was made to make no changes to the surgical schedule at this time pending cardiac evaluation and clearance for the procedure. We will plan to follow up on Monday (03/13/2021) following outpatient cardiology visit to determine disposition for upcoming urological procedure.   Honor Loh, MSN, APRN, FNP-C, CEN [facility]  Peri-operative Services Nurse Practitioner Phone: [phone] 03/11/21 2:18 PM  NOTE: This note has been prepared using Dragon dictation software. Despite my best ability to proofread, there is always the potential that unintentional transcriptional errors may still occur from this process.

## 2021-03-13 ENCOUNTER — Encounter: Payer: Self-pay | Admitting: Cardiology

## 2021-03-13 ENCOUNTER — Ambulatory Visit (INDEPENDENT_AMBULATORY_CARE_PROVIDER_SITE_OTHER): Payer: Medicare Other | Admitting: Cardiology

## 2021-03-13 ENCOUNTER — Other Ambulatory Visit: Payer: Self-pay

## 2021-03-13 ENCOUNTER — Telehealth: Payer: Self-pay | Admitting: Urgent Care

## 2021-03-13 VITALS — BP 120/70 | HR 84 | Ht 65.0 in | Wt 191.0 lb

## 2021-03-13 DIAGNOSIS — Z01810 Encounter for preprocedural cardiovascular examination: Secondary | ICD-10-CM | POA: Diagnosis not present

## 2021-03-13 DIAGNOSIS — I2510 Atherosclerotic heart disease of native coronary artery without angina pectoris: Secondary | ICD-10-CM | POA: Diagnosis not present

## 2021-03-13 DIAGNOSIS — E782 Mixed hyperlipidemia: Secondary | ICD-10-CM

## 2021-03-13 MED ORDER — EZETIMIBE 10 MG PO TABS: 10.0000 mg | ORAL_TABLET | Freq: Every day | ORAL | 3 refills | Status: DC

## 2021-03-13 MED ORDER — LOVASTATIN 40 MG PO TABS: 40.0000 mg | ORAL_TABLET | Freq: Two times a day (BID) | ORAL | 3 refills | Status: DC

## 2021-03-13 NOTE — Progress Notes (Signed)
Cardiology Office Note:    Date:  03/13/2021   ID:  Mariah Reed, DOB 1945-06-16, MRN [id number]  PCP:  Gladstone Lighter, MD   [place] [facility] Cardiologist:  Kate Sable, MD     Referring MD: Gladstone Lighter, MD   Chief Complaint  Patient presents with   Other    Patient needs pre op clearance -- CYSTOSCOPY/URETEROSCOPY/HOLMIUM LASER/STENT PLACEMENT (Right). Meds reviewed verbally with patient.     History of Present Illness:    Mariah Reed is a 76 y.o. female with a hx of hypertension, hyperlipidemia, diabetes, COPD, former smoker presenting for preop evaluation.  Recently seen in the hospital 01/26/2021 due to symptoms of chest pain.   Underwent chest CTA 01/23/2021 showing mild LAD calcification disease. Echocardiogram 01/24/2021 EF 50 to 55%.  Normal diastolic function. Lexiscan Myoview 01/27/2021 no significant perfusion abnormalities, small inferior apical defect and apical thinning.  Medical therapy advised.  Aspirin was previously recommended, patient declined due to stomach upset.  Patient recently admitted for renal stone, s/p stent placement.  Urological procedure/cystoscopy, placement, possible stent removal is being planned.  She denies chest pain or shortness of breath.  Takes lovastatin, cannot tolerate Crestor.  Has a history of GI bleeds and stomach irritation, avoiding aspirin due to this.  Past Medical History:  Diagnosis Date   Allergy    CHF (congestive heart failure) (HCC)    COPD (chronic obstructive pulmonary disease) (HCC)    Diabetes mellitus    GERD (gastroesophageal reflux disease)    Gout    Headache    Hyperlipidemia    Hypertension    Hypothyroidism    LBBB (left bundle branch block)    Low back pain    Myocardial infarction [facility])    Nephrolithiasis    Peripheral neuropathy    Pneumonia    Rhabdomyolysis    a.) noted during 12/16-12/25/2022 admission for sepsis 2/2 RIGHT ureteral and RIGHT staghorn  nephrolithiasis.   Sepsis ([place]) 01/20/2021   a.) sepsis 2/2 RIGHT ureteral and RIGHT staghorn nephrolithiasis.    Past Surgical History:  Procedure Laterality Date   ABDOMINAL HYSTERECTOMY     CHOLECYSTECTOMY  unknown   Patient stated she does not remember when it was removed. (possibly 1997)   [place], URETEROSCOPY AND STENT PLACEMENT Right 01/20/2021   Procedure: [place], URETEROSCOPY AND STENT PLACEMENT;  Surgeon: Janith Lima, MD;  Location: [facility];  Service: Urology;  Laterality: Right;   POLYPECTOMY     Colon   TUBAL LIGATION      Current Medications: Current Meds  Medication Sig   ACCU-CHEK FASTCLIX LANCETS MISC 1 Device by Does not apply route 3 (three) times daily. Use to check blood sugar up to three times a day   acetaminophen (TYLENOL) 325 MG tablet Take 2 tablets (650 mg total) by mouth every 6 (six) hours as needed for mild pain (or Fever >/= 101).   albuterol (VENTOLIN HFA) 108 (90 Base) MCG/ACT inhaler INHALE 2 PUFFS INTO THE LUNGS EVERY 4 HOURS AS NEEDED   alum & mag hydroxide-simeth (MAALOX/MYLANTA) 200-200-20 MG/5ML suspension Take 30 mLs by mouth 2 (two) times daily.   benzonatate (TESSALON) 100 MG capsule Take 1 capsule (100 mg total) by mouth 3 (three) times daily as needed for cough.   Cyanocobalamin (B-12 PO) Take 1 tablet by mouth daily.   diclofenac (FLECTOR) 1.3 % PTCH Place 1 patch onto the skin 2 (two) times daily.   diclofenac sodium (VOLTAREN)  1 % GEL APPLY 2 GRAMS TOPICALLY 4 TIMES DAILY   diphenoxylate-atropine (LOMOTIL) 2.5-0.025 MG tablet Take 2 tablets by mouth 2 (two) times daily. Take with imodium   empagliflozin (JARDIANCE) 25 MG TABS tablet Take 25 mg by mouth daily after lunch.   esomeprazole (NEXIUM) 40 MG capsule TAKE 1 CAPSULE BY MOUTH ONCE DAILY   ezetimibe (ZETIA) 10 MG tablet Take 1 tablet (10 mg total) by mouth daily.   FEXOFENADINE HCL PO Take 0.5 tablets by mouth 2 (two) times  daily.   fluticasone (FLONASE) 50 MCG/ACT nasal spray Place 2 sprays into both nostrils daily.   glipiZIDE (GLUCOTROL) 10 MG tablet Take 1 tablet (10 mg total) by mouth 2 (two) times daily before a meal.   guaiFENesin (MUCINEX) 600 MG 12 hr tablet Take by mouth 2 (two) times daily.   guaiFENesin-dextromethorphan (ROBITUSSIN DM) 100-10 MG/5ML syrup Take 5 mLs by mouth every 4 (four) hours as needed for cough.   hydrOXYzine (ATARAX/VISTARIL) 50 MG tablet Take 1 tablet (50 mg total) by mouth 3 (three) times daily as needed.   levothyroxine (SYNTHROID) 75 MCG tablet Take 1 tablet (75 mcg total) by mouth daily.   lipase/protease/amylase (CREON) 36000 UNITS CPEP capsule Take 36,000-72,000 Units by mouth See admin instructions. Take 2 capsules (72000 units) by mouth three times daily with meals, take 1 capsule (36000 units) with snacks or drinks   loperamide (IMODIUM) 2 MG capsule Take 8 mg by mouth 2 (two) times daily. Take with lomotil   losartan (COZAAR) 50 MG tablet Take 50 mg by mouth daily.   menthol-cetylpyridinium (CEPACOL) 3 MG lozenge Take 1 lozenge (3 mg total) by mouth as needed for sore throat.   metoprolol tartrate (LOPRESSOR) 25 MG tablet Take 1 tablet (25 mg total) by mouth 2 (two) times daily.   NIFEdipine (ADALAT CC) 30 MG 24 hr tablet Take 1 tablet (30 mg total) by mouth daily.   nitroGLYCERIN (NITROSTAT) 0.4 MG SL tablet Take SL prn chest pain/May repeat 5 min apart up to 3 times prn   nystatin cream (MYCOSTATIN) APPLY TO AFFECTED AREAS ONCE DAILY AS NEEDED   ondansetron (ZOFRAN) 4 MG tablet Take 1 tablet (4 mg total) by mouth every 6 (six) hours as needed for nausea.   oxyCODONE (OXYCONTIN) 20 mg 12 hr tablet Take 1 tablet (20 mg total) by mouth every 12 (twelve) hours.   PATADAY 0.2 % SOLN PLACE 1 DROP INTO BOTH EYES EVERY DAY   pregabalin (LYRICA) 25 MG capsule Take 1 capsule (25 mg total) by mouth 2 (two) times daily.   sulfamethoxazole-trimethoprim (BACTRIM DS) 800-160 MG tablet  Take 1 tablet by mouth every 12 (twelve) hours.   tiotropium (SPIRIVA HANDIHALER) 18 MCG inhalation capsule INHALE THE CONTENTS OF 1 CAPSUOLE VIA HANDIHALER ONCE DAILY   tiZANidine (ZANAFLEX) 4 MG tablet Take 4 mg by mouth See admin instructions. Take one tablet (4 mg) by mouth twice daily - at bedtime and 1am   topiramate (TOPAMAX) 100 MG tablet Take 3 tablets (300 mg total) by mouth daily.   zolpidem (AMBIEN) 5 MG tablet Take 1 tablet (5 mg total) by mouth at bedtime as needed. for sleep   [DISCONTINUED] lovastatin (MEVACOR) 40 MG tablet Take 40 mg by mouth at bedtime.   [DISCONTINUED] rosuvastatin (CRESTOR) 20 MG tablet Take 1 tablet (20 mg total) by mouth daily.     Allergies:   Levofloxacin, Nortriptyline, and Tape   Social History   Socioeconomic History   Marital status: Legally  Separated    Spouse name: Satori Krabill   Number of children: Not on file   Years of education: Not on file   Highest education level: Not on file  Occupational History    Employer: DISABLED  Tobacco Use   Smoking status: Former    Packs/day: 2.50    Years: 10.00    Pack years: 25.00    Types: Cigarettes   Smokeless tobacco: Never  Substance and Sexual Activity   Alcohol use: No   Drug use: No   Sexual activity: Not on file  Other Topics Concern   Not on file  Social History Narrative   Pt would like [name] at [phone] (nephew) to be called in case of emergency and ask him to call her daughter [name] at [phone].   Social Determinants of Health   Financial Resource Strain: Not on file  Food Insecurity: Not on file  Transportation Needs: Not on file  Physical Activity: Not on file  Stress: Not on file  Social Connections: Not on file     Family History: The patient's family history includes Uterine cancer in her mother.  ROS:   Please see the history of present illness.     All other systems reviewed and are negative.  EKGs/Labs/Other Studies Reviewed:     The following studies were reviewed today:   EKG:  EKG not  ordered today.   Recent Labs: 01/26/2021: TSH 5.557 01/29/2021: ALT 22; BUN 13; Creatinine, Ser 0.82; Hemoglobin 13.9; Magnesium 2.2; Platelets 206; Potassium 3.6; Sodium 139  Recent Lipid Panel    Component Value Date/Time   CHOL 168 01/26/2021 0546   CHOL 170 10/27/2019 1009   TRIG 186 (H) 01/26/2021 0546   HDL 22 (L) 01/26/2021 0546   HDL 45 10/27/2019 1009   CHOLHDL 7.6 01/26/2021 0546   VLDL 37 01/26/2021 0546   LDLCALC 109 (H) 01/26/2021 0546   LDLCALC 95 10/27/2019 1009   LDLCALC 113 (H) 02/10/2018 1123   LDLDIRECT 71.0 03/17/2019 1135     Risk Assessment/Calculations:          Physical Exam:    VS:  BP 120/70 (BP Location: Left Arm, Patient Position: Sitting, Cuff Size: Normal)    Pulse 84    Ht 5\' 5"  (1.651 m)    Wt 191 lb (86.6 kg)    SpO2 95%    BMI 31.78 kg/m     Wt Readings from Last 3 Encounters:  03/13/21 191 lb (86.6 kg)  03/09/21 198 lb (89.8 kg)  02/22/21 185 lb (83.9 kg)     GEN:  Well nourished, well developed in no acute distress HEENT: Normal NECK: No JVD; No carotid bruits LYMPHATICS: No lymphadenopathy CARDIAC: RRR, no murmurs, rubs, gallops RESPIRATORY:  Clear to auscultation without rales, wheezing or rhonchi  ABDOMEN: Soft, non-tender, non-distended MUSCULOSKELETAL:  No edema; No deformity  SKIN: Warm and dry NEUROLOGIC:  Alert and oriented x 3 PSYCHIATRIC:  Normal affect   ASSESSMENT:    1. Pre-operative cardiovascular examination   2. Mixed hyperlipidemia   3. Coronary artery disease involving native coronary artery of native heart without angina pectoris    PLAN:    In order of problems listed above:  Preop evaluation prior to urological procedure.  Your procedure is typically deemed low risk from the cardiac perspective.  Last echo with preserved EF, Lexiscan Myoview with no significant ischemia.  Okay to proceed with procedure from a cardiac  perspective. Hyperlipidemia, not tolerant of Crestor.  Increase lovastatin to 40 mg twice daily, start Zetia 10 mg daily.  Check lipid panel in 6 weeks. LAD calcifications on chest CT.  Lovastatin, Zetia as above.  Avoiding aspirin due to gastric irritation/bleed.  Follow-up in 2 to 3 months.       Medication Adjustments/Labs and Tests Ordered: Current medicines are reviewed at length with the patient today.  Concerns regarding medicines are outlined above.  Orders Placed This Encounter  Procedures   Lipid panel   EKG 12-Lead   Meds ordered this encounter  Medications   lovastatin (MEVACOR) 40 MG tablet    Sig: Take 1 tablet (40 mg total) by mouth 2 (two) times daily.    Dispense:  60 tablet    Refill:  3   ezetimibe (ZETIA) 10 MG tablet    Sig: Take 1 tablet (10 mg total) by mouth daily.    Dispense:  30 tablet    Refill:  3    Patient Instructions  Medication Instructions:   Your physician has recommended you make the following change in your medication:    INCREASE your Lovastatin to 40 MG twice a day.  2.    START taking Ezetimibe (Zetia) 10 MG once a day,.  *If you need a refill on your cardiac medications before your next appointment, please call your pharmacy*   Lab Work:  Your physician recommends that you return for a FASTING lipid profile: In 6-8 weeks  - You will need to be fasting. Please do not have anything to eat or drink after midnight the morning you have the lab work. You may only have water or black coffee with no cream or sugar.   We will call you closer to your appointment time and schedule you in our office for this lab draw.    Testing/Procedures: None Ordered   Follow-Up: At [facility], you and your health needs are our priority.  As part of our continuing mission to provide you with exceptional heart care, we have created designated Provider Care Teams.  These Care Teams include your primary Cardiologist (physician) and Advanced  Practice Providers (APPs -  Physician Assistants and Nurse Practitioners) who all work together to provide you with the care you need, when you need it.  We recommend signing up for the patient portal called "MyChart".  Sign up information is provided on this After Visit Summary.  MyChart is used to connect with patients for Virtual Visits (Telemedicine).  Patients are able to view lab/test results, encounter notes, upcoming appointments, etc.  Non-urgent messages can be sent to your provider as well.   To learn more about what you can do with MyChart, go to [email or web address].    Your next appointment:   2 month(s)  The format for your next appointment:   In Person  Provider:   You may see Kate Sable, MD or one of the following Advanced Practice Providers on your designated Care Team:   Murray Hodgkins, NP Christell Faith, PA-C Cadence Kathlen Mody, [place]    Other Instructions     Signed, Kate Sable, MD  03/13/2021 11:54 AM    [place]

## 2021-03-13 NOTE — Progress Notes (Signed)
°  Perioperative Services Pre-Admission/Anesthesia Testing    Date: 03/13/21  Name: Mariah Reed MRN:   [id number]  Re: Preoperative cardiology clearance  Clinical Notes:  Patient is scheduled to undergo a CYSTOSCOPY/URETEROSCOPY/HOLMIUM LASER/STENT PLACEMENT on 03/14/2021 with Dr. John Giovanni, MD.  In review of her PAT documentation, it was noted the patient was recently discharged from the hospital with recommendations for outpatient cardiology consult and possible cardiac catheterization.  We were able to get patient worked and to see cardiologist today (03/13/2021).    Patient was seen in consult by Dr. Kate Sable, MD per her request; notes reviewed.  At the time of her clinic visit, patient doing well overall from a cardiovascular perspective.  She denied any episodes of chest pain, shortness breath, PND, orthopnea, palpitations, significant peripheral edema, vertiginous symptoms, or presyncope/syncope.  Blood pressure was well controlled at 120/70 on prescribed beta-blocker and nifedipine.  She was on a statin for HLD diagnosis.  Patient noted to be intolerant of Crestor, therefore lovastatin dose was increased to twice daily and ezetimibe was started.  Patient unable to be on daily low-dose aspirin due to previous gastric irritation/bleeding. T2DM poorly controlled on currently prescribed regimen; last HgbA1c was 10.5% when checked on 01/21/2021. Functional capacity, as defined by DASI, is documented as being >/= 4 METS.  More made to her medication regimen no other change.  Patient to follow-up with outpatient cardiology in 2 to 3 months or sooner if needed.  Regarding her upcoming procedure, cardiology noted that planned procedure is typically deemed to be low risk from a cardiovascular perspective.  Echocardiogram and myocardial perfusion imaging study performed during recent admission reviewed by cardiologist.  Following consultation visit cardiology advised that patient was  "cleared to proceed with planned procedure from a cardiac perspective" giving her an overall ACCEPTABLE risk of significant cardiovascular complications.  Impression and Plan:  Mariah Reed has been referred for pre-anesthesia review and clearance prior to her undergoing the planned anesthetic and procedural courses. Available labs, pertinent testing, and imaging results were personally reviewed by me. This patient has been appropriately cleared by cardiology with an overall ACCEPTABLE risk of significant perioperative cardiovascular complications.  Based on clinical review performed today (03/13/21), barring any significant acute changes in the patient's overall condition, it is anticipated that she will be able to proceed with the planned surgical intervention. Any acute changes in clinical condition may necessitate her procedure being postponed and/or cancelled. Patient will meet with anesthesia team (MD and/or CRNA) on this day of her procedure for preoperative evaluation/assessment.   Pre-surgical instructions were reviewed with the patient during her PAT appointment and questions were fielded by PAT clinical staff. Patient was advised that if any questions or concerns arise prior to her procedure then she should return a call to PAT and/or her surgeon's office to discuss.  Honor Loh, MSN, APRN, FNP-C, CEN [facility]  Peri-operative Services Nurse Practitioner Phone: [phone] 03/13/21 11:29 AM  NOTE: This note has been prepared using Dragon dictation software. Despite my best ability to proofread, there is always the potential that unintentional transcriptional errors may still occur from this process.

## 2021-03-13 NOTE — Patient Instructions (Signed)
Medication Instructions:   Your physician has recommended you make the following change in your medication:    INCREASE your Lovastatin to 40 MG twice a day.  2.    START taking Ezetimibe (Zetia) 10 MG once a day,.  *If you need a refill on your cardiac medications before your next appointment, please call your pharmacy*   Lab Work:  Your physician recommends that you return for a FASTING lipid profile: In 6-8 weeks  - You will need to be fasting. Please do not have anything to eat or drink after midnight the morning you have the lab work. You may only have water or black coffee with no cream or sugar.   We will call you closer to your appointment time and schedule you in our office for this lab draw.    Testing/Procedures: None Ordered   Follow-Up: At [facility], you and your health needs are our priority.  As part of our continuing mission to provide you with exceptional heart care, we have created designated Provider Care Teams.  These Care Teams include your primary Cardiologist (physician) and Advanced Practice Providers (APPs -  Physician Assistants and Nurse Practitioners) who all work together to provide you with the care you need, when you need it.  We recommend signing up for the patient portal called "MyChart".  Sign up information is provided on this After Visit Summary.  MyChart is used to connect with patients for Virtual Visits (Telemedicine).  Patients are able to view lab/test results, encounter notes, upcoming appointments, etc.  Non-urgent messages can be sent to your provider as well.   To learn more about what you can do with MyChart, go to [email or web address].    Your next appointment:   2 month(s)  The format for your next appointment:   In Person  Provider:   You may see Kate Sable, MD or one of the following Advanced Practice Providers on your designated Care Team:   Murray Hodgkins, NP Christell Faith, PA-C Cadence Kathlen Mody, [place]    Other  Instructions

## 2021-03-14 ENCOUNTER — Other Ambulatory Visit: Payer: Self-pay

## 2021-03-14 ENCOUNTER — Encounter: Payer: Self-pay | Admitting: Urology

## 2021-03-14 ENCOUNTER — Ambulatory Visit: Payer: Medicare Other | Admitting: Urgent Care

## 2021-03-14 ENCOUNTER — Ambulatory Visit: Payer: Medicare Other

## 2021-03-14 ENCOUNTER — Encounter
Admission: RE | Disposition: A | Discharge status: Home / Self Care | Payer: Self-pay | Source: Home / Self Care | Attending: Urology

## 2021-03-14 ENCOUNTER — Ambulatory Visit
Admission: RE | Admit: 2021-03-14 | Discharge: 2021-03-14 | Disposition: A | Discharge status: Home / Self Care | Payer: Medicare Other | Attending: Urology | Admitting: Urology

## 2021-03-14 DIAGNOSIS — N201 Calculus of ureter: Secondary | ICD-10-CM

## 2021-03-14 DIAGNOSIS — E119 Type 2 diabetes mellitus without complications: Secondary | ICD-10-CM | POA: Diagnosis not present

## 2021-03-14 DIAGNOSIS — I447 Left bundle-branch block, unspecified: Secondary | ICD-10-CM | POA: Insufficient documentation

## 2021-03-14 DIAGNOSIS — N1330 Unspecified hydronephrosis: Secondary | ICD-10-CM

## 2021-03-14 DIAGNOSIS — I509 Heart failure, unspecified: Secondary | ICD-10-CM | POA: Diagnosis not present

## 2021-03-14 DIAGNOSIS — I110 Hypertensive heart disease with heart failure: Secondary | ICD-10-CM | POA: Insufficient documentation

## 2021-03-14 DIAGNOSIS — Z87891 Personal history of nicotine dependence: Secondary | ICD-10-CM | POA: Insufficient documentation

## 2021-03-14 DIAGNOSIS — N132 Hydronephrosis with renal and ureteral calculous obstruction: Secondary | ICD-10-CM | POA: Insufficient documentation

## 2021-03-14 DIAGNOSIS — J449 Chronic obstructive pulmonary disease, unspecified: Secondary | ICD-10-CM | POA: Insufficient documentation

## 2021-03-14 DIAGNOSIS — E039 Hypothyroidism, unspecified: Secondary | ICD-10-CM | POA: Diagnosis not present

## 2021-03-14 DIAGNOSIS — I2510 Atherosclerotic heart disease of native coronary artery without angina pectoris: Secondary | ICD-10-CM | POA: Insufficient documentation

## 2021-03-14 DIAGNOSIS — N200 Calculus of kidney: Secondary | ICD-10-CM

## 2021-03-14 DIAGNOSIS — I252 Old myocardial infarction: Secondary | ICD-10-CM | POA: Diagnosis not present

## 2021-03-14 DIAGNOSIS — N202 Calculus of kidney with calculus of ureter: Secondary | ICD-10-CM | POA: Diagnosis not present

## 2021-03-14 HISTORY — PX: CYSTOSCOPY/URETEROSCOPY/HOLMIUM LASER/STENT PLACEMENT: SHX6546

## 2021-03-14 HISTORY — DX: Left bundle-branch block, unspecified: I44.7

## 2021-03-14 HISTORY — DX: Calculus of kidney: N20.0

## 2021-03-14 HISTORY — DX: Rhabdomyolysis: M62.82

## 2021-03-14 LAB — GLUCOSE, CAPILLARY
Glucose-Capillary: 127 mg/dL — ABNORMAL HIGH (ref 70–99)
Glucose-Capillary: 122 mg/dL — ABNORMAL HIGH (ref 70–99)

## 2021-03-14 SURGERY — CYSTOSCOPY/URETEROSCOPY/HOLMIUM LASER/STENT PLACEMENT
Anesthesia: General | Site: Ureter | Laterality: Right

## 2021-03-14 MED ORDER — FENTANYL CITRATE (PF) 100 MCG/2ML IJ SOLN: 25.0000 ug | INTRAMUSCULAR | Status: DC | PRN

## 2021-03-14 MED ORDER — PROPOFOL 10 MG/ML IV BOLUS
INTRAVENOUS | Status: DC | PRN
  Administered 2021-03-14: 130 mg via INTRAVENOUS

## 2021-03-14 MED ORDER — FENTANYL CITRATE (PF) 100 MCG/2ML IJ SOLN
INTRAMUSCULAR | Status: AC
  Filled 2021-03-14: qty 2

## 2021-03-14 MED ORDER — CHLORHEXIDINE GLUCONATE 0.12 % MT SOLN: 15.0000 mL | Freq: Once | OROMUCOSAL | Status: AC

## 2021-03-14 MED ORDER — LACTATED RINGERS IV SOLN: INTRAVENOUS | Status: DC

## 2021-03-14 MED ORDER — SODIUM CHLORIDE 0.9 % IV SOLN
1.0000 g | INTRAVENOUS | Status: AC
  Administered 2021-03-14: 1 g via INTRAVENOUS
  Filled 2021-03-14: qty 1

## 2021-03-14 MED ORDER — MIDAZOLAM HCL 2 MG/2ML IJ SOLN
INTRAMUSCULAR | Status: DC | PRN
  Administered 2021-03-14: 2 mg via INTRAVENOUS

## 2021-03-14 MED ORDER — SODIUM CHLORIDE 0.9 % IR SOLN
Status: DC | PRN
  Administered 2021-03-14: 2000 mL

## 2021-03-14 MED ORDER — TROSPIUM CHLORIDE 20 MG PO TABS: 20.0000 mg | ORAL_TABLET | Freq: Two times a day (BID) | ORAL | 1 refills | Status: DC | PRN

## 2021-03-14 MED ORDER — PHENYLEPHRINE HCL (PRESSORS) 10 MG/ML IV SOLN
INTRAVENOUS | Status: DC | PRN
  Administered 2021-03-14: 160 ug via INTRAVENOUS
  Administered 2021-03-14 (×2): 80 ug via INTRAVENOUS
  Administered 2021-03-14: 160 ug via INTRAVENOUS

## 2021-03-14 MED ORDER — ROCURONIUM BROMIDE 100 MG/10ML IV SOLN
INTRAVENOUS | Status: DC | PRN
  Administered 2021-03-14: 40 mg via INTRAVENOUS

## 2021-03-14 MED ORDER — ACETAMINOPHEN 10 MG/ML IV SOLN: 1000.0000 mg | Freq: Once | INTRAVENOUS | Status: DC | PRN

## 2021-03-14 MED ORDER — SODIUM CHLORIDE 0.9 % IV SOLN: INTRAVENOUS | Status: DC

## 2021-03-14 MED ORDER — PROPOFOL 10 MG/ML IV BOLUS
INTRAVENOUS | Status: AC
  Filled 2021-03-14: qty 20

## 2021-03-14 MED ORDER — ORAL CARE MOUTH RINSE: 15.0000 mL | Freq: Once | OROMUCOSAL | Status: AC

## 2021-03-14 MED ORDER — MIDAZOLAM HCL 2 MG/2ML IJ SOLN
INTRAMUSCULAR | Status: AC
  Filled 2021-03-14: qty 2

## 2021-03-14 MED ORDER — ONDANSETRON HCL 4 MG/2ML IJ SOLN
INTRAMUSCULAR | Status: DC | PRN
  Administered 2021-03-14: 4 mg via INTRAVENOUS

## 2021-03-14 MED ORDER — ONDANSETRON HCL 4 MG/2ML IJ SOLN: 4.0000 mg | Freq: Once | INTRAMUSCULAR | Status: DC | PRN

## 2021-03-14 MED ORDER — DEXAMETHASONE SODIUM PHOSPHATE 10 MG/ML IJ SOLN
INTRAMUSCULAR | Status: AC
  Filled 2021-03-14: qty 1

## 2021-03-14 MED ORDER — FENTANYL CITRATE (PF) 100 MCG/2ML IJ SOLN
INTRAMUSCULAR | Status: DC | PRN
Start: 2021-03-14 — End: 2021-03-14
  Administered 2021-03-14: 50 ug via INTRAVENOUS

## 2021-03-14 MED ORDER — SUGAMMADEX SODIUM 200 MG/2ML IV SOLN
INTRAVENOUS | Status: DC | PRN
  Administered 2021-03-14: 200 mg via INTRAVENOUS

## 2021-03-14 MED ORDER — ROCURONIUM BROMIDE 10 MG/ML (PF) SYRINGE
PREFILLED_SYRINGE | INTRAVENOUS | Status: AC
  Filled 2021-03-14: qty 10

## 2021-03-14 MED ORDER — GLYCOPYRROLATE 0.2 MG/ML IJ SOLN
INTRAMUSCULAR | Status: DC | PRN
  Administered 2021-03-14: .2 mg via INTRAVENOUS

## 2021-03-14 MED ORDER — DEXAMETHASONE SODIUM PHOSPHATE 10 MG/ML IJ SOLN
INTRAMUSCULAR | Status: DC | PRN
  Administered 2021-03-14: 5 mg via INTRAVENOUS

## 2021-03-14 MED ORDER — CHLORHEXIDINE GLUCONATE 0.12 % MT SOLN
OROMUCOSAL | Status: AC
  Administered 2021-03-14: 15 mL via OROMUCOSAL
  Filled 2021-03-14: qty 15

## 2021-03-14 MED ORDER — IOHEXOL 180 MG/ML  SOLN
INTRAMUSCULAR | Status: DC | PRN
  Administered 2021-03-14: 10 mL

## 2021-03-14 MED ORDER — OXYCODONE HCL 5 MG/5ML PO SOLN: 5.0000 mg | Freq: Once | ORAL | Status: DC | PRN

## 2021-03-14 MED ORDER — ONDANSETRON HCL 4 MG/2ML IJ SOLN
INTRAMUSCULAR | Status: AC
  Filled 2021-03-14: qty 2

## 2021-03-14 MED ORDER — OXYCODONE HCL 5 MG PO TABS: 5.0000 mg | ORAL_TABLET | Freq: Once | ORAL | Status: DC | PRN

## 2021-03-14 SURGICAL SUPPLY — 33 items
BAG DRAIN CYSTO-URO LG1000N (MISCELLANEOUS) ×2 IMPLANT
BASKET ZERO TIP 1.9FR (BASKET) ×1 IMPLANT
BRUSH SCRUB EZ 1% IODOPHOR (MISCELLANEOUS) ×2 IMPLANT
BSKT STON RTRVL ZERO TP 1.9FR (BASKET) ×1
CATH URET FLEX-TIP 2 LUMEN 10F (CATHETERS) ×1 IMPLANT
CATH URETL OPEN END 6X70 (CATHETERS) IMPLANT
CNTNR SPEC 2.5X3XGRAD LEK (MISCELLANEOUS) ×1
CONT SPEC 4OZ STER OR WHT (MISCELLANEOUS) ×1
CONT SPEC 4OZ STRL OR WHT (MISCELLANEOUS) ×1
CONTAINER SPEC 2.5X3XGRAD LEK (MISCELLANEOUS) IMPLANT
DRAPE UTILITY 15X26 TOWEL STRL (DRAPES) ×2 IMPLANT
FIBER LASER MOSES 200 DFL (Laser) ×1 IMPLANT
GAUZE 4X4 16PLY ~~LOC~~+RFID DBL (SPONGE) ×4 IMPLANT
GLOVE SURG UNDER POLY LF SZ7.5 (GLOVE) ×2 IMPLANT
GOWN SRG LRG LVL 4 IMPRV REINF (GOWNS) IMPLANT
GOWN STRL REIN LRG LVL4 (GOWNS) ×2
GOWN STRL REUS W/ TWL LRG LVL3 (GOWN DISPOSABLE) ×1 IMPLANT
GOWN STRL REUS W/ TWL XL LVL3 (GOWN DISPOSABLE) ×1 IMPLANT
GOWN STRL REUS W/TWL LRG LVL3 (GOWN DISPOSABLE) ×2
GOWN STRL REUS W/TWL XL LVL3 (GOWN DISPOSABLE) ×2
GUIDEWIRE STR DUAL SENSOR (WIRE) ×3 IMPLANT
INFUSOR MANOMETER BAG 3000ML (MISCELLANEOUS) ×1 IMPLANT
IV NS IRRIG 3000ML ARTHROMATIC (IV SOLUTION) ×2 IMPLANT
KIT TURNOVER CYSTO (KITS) ×2 IMPLANT
PACK CYSTO AR (MISCELLANEOUS) ×2 IMPLANT
SET CYSTO W/LG BORE CLAMP LF (SET/KITS/TRAYS/PACK) ×2 IMPLANT
SHEATH URETERAL 12FRX35CM (MISCELLANEOUS) ×1 IMPLANT
STENT URET 6FRX24 CONTOUR (STENTS) ×1 IMPLANT
STENT URET 6FRX26 CONTOUR (STENTS) IMPLANT
SURGILUBE 2OZ TUBE FLIPTOP (MISCELLANEOUS) ×2 IMPLANT
TRACTIP FLEXIVA PULSE ID 200 (Laser) ×2 IMPLANT
VALVE UROSEAL ADJ ENDO (VALVE) ×1 IMPLANT
WATER STERILE IRR 500ML POUR (IV SOLUTION) ×2 IMPLANT

## 2021-03-14 NOTE — Anesthesia Postprocedure Evaluation (Signed)
Anesthesia Post Note  Patient: Mariah Reed  Procedure(s) Performed: CYSTOSCOPY/URETEROSCOPY/HOLMIUM LASER/STENT PLACEMENT (Right: Ureter)  Patient location during evaluation: PACU Anesthesia Type: General Level of consciousness: awake and alert, oriented and patient cooperative Pain management: pain level controlled Vital Signs Assessment: post-procedure vital signs reviewed and stable Respiratory status: spontaneous breathing, nonlabored ventilation and respiratory function stable Cardiovascular status: blood pressure returned to baseline and stable Postop Assessment: adequate PO intake Anesthetic complications: no   No notable events documented.   Last Vitals:  Vitals:   03/14/21 1048 03/14/21 1100  BP: (!) 122/49 (!) 142/87  Pulse: 88 77  Resp: 12 14  Temp: 36.9 C (!) 36.3 C  SpO2: 96% 96%    Last Pain:  Vitals:   03/14/21 1100  TempSrc: Temporal  PainSc: 0-No pain                 Darrin Nipper

## 2021-03-14 NOTE — Op Note (Signed)
Preoperative diagnosis: Right nephrolithiasis   Postoperative diagnosis: Right nephrolithiasis  Procedure:  Cystoscopy  Right ureteroscopy and stone removal Ureteroscopic laser lithotripsy Right ureteral stent exchange (15F/24 cm)  Right retrograde pyelography with interpretation  Surgeon: Nicki Reaper C. England Greb, M.D.  Anesthesia: General  Complications: None  Intraoperative findings: Cystoscopy-bladder mucosa without solid or papillary lesions.  Inflammatory changes right hemitrigone secondary to indwelling stent.   Ureteroscopy-no ureteral calculi or mucosal abnormalities noted Pyeloscopy-partial staghorn lower pole calculus involving a lower pole infundibulum and renal pelvis by CT measuring 10 x 35 mm Right retrograde pyelography post procedure showed no filling defects, stone fragments or contrast extravasation.  There was moderate hydronephrosis present which was noted on recent CTs with the indwelling stent  EBL: Minimal  Specimens: Calculus fragments for analysis   Indication: Mariah Reed is a 76 y.o. female admitted December 2022 with UTI, emphysematous pyelitis, pyonephrosis and an obstructing lower pole partial staghorn calculus extending into the renal pelvis.  She underwent urgent stent placement and presents today for definitive stone treatment.  She declined PCNL and desired ureteroscopy, possibly staged.  After reviewing the management options for treatment, the patient elected to proceed with the above surgical procedure(s). We have discussed the potential benefits and risks of the procedure, side effects of the proposed treatment, the likelihood of the patient achieving the goals of the procedure, and any potential problems that might occur during the procedure or recuperation. Informed consent has been obtained.  Description of procedure:  The patient was taken to the operating room and general anesthesia was induced.  The patient was placed in the dorsal lithotomy  position, prepped and draped in the usual sterile fashion, and preoperative antibiotics were administered. A preoperative time-out was performed.   A 21 French cystoscope was lubricated and passed per urethra.  Panendoscopy was performed with findings as described above.  The stent was grasped with endoscopic forceps and brought out through the urethral meatus.  A 0.038 Sensor wire would not advance through the stent and the cystoscope was repassed and the guidewire was placed alongside the stent and advanced into the renal pelvis under fluoroscopic guidance.  The calculus was easily visualized on fluoroscopy.  The indwelling stent was then removed.  The cystoscope was removed and a dual-lumen catheter was placed over the guidewire.  Retrograde pyelogram was performed which showed lower pole partial staghorn calculus extending into the renal pelvis.  A second guidewire was placed and the dual-lumen catheter was removed.  A 12/14 French ureteral access sheath was placed over the working wire and advance to the upper proximal ureter without difficulty.  The inner stylette and working wire were removed.  A dual channel digital flexible ureteroscope was then placed through the sheath and into the renal pelvis with findings as described above.  A 200 m Moses laser fiber was placed through the ureteroscope and the calculus was dusted at an initial setting of 0.3J/80 Hz and increased to 0.3/100.  The calculus had density ~ 700 HU on CT and easily dusted.  Once completely dusted areas where the fragments had settled were further treated with noncontact laser lithotripsy at 0.6/100.  A 1.9 French nitinol basket was placed through the ureteroscope and a grouping of fragments was snared and sent for analysis.  Repeat retrograde pyelogram was performed through the ureteroscope and all calyces were examined.  No fragments were identified that were significantly larger than the tip of the laser fiber.  The  ureteral access sheath and ureteroscope were  removed in tandem and the ureter showed no evidence of injury or perforation.  A 55F/24 cm Contour ureteral stent was placed under fluoroscopic guidance.  The wire was then removed with an adequate stent curl noted in the renal pelvis as well as in the bladder.  The bladder was then emptied and the procedure ended.  The patient appeared to tolerate the procedure well and without complications.  After anesthetic reversal the patient was transported to the PACU in stable condition.   Plan: Due to stone volume and the number of dusted fragments produced will plan a second look ureteroscopy in approximately 2 weeks   John Giovanni, MD

## 2021-03-14 NOTE — Discharge Instructions (Addendum)
DISCHARGE INSTRUCTIONS FOR KIDNEY STONE/URETERAL STENT   MEDICATIONS:  1. Resume all your other meds from home.  2.  AZO (over-the-counter) can help with the burning/stinging when you urinate. 3.  Trospium is for bladder irritation.  Rx was sent to your pharmacy.  ACTIVITY:  1. May resume regular activities in 24 hours. 2. No driving while on narcotic pain medications  3. Drink plenty of water  4. Continue to walk at home - you can still get blood clots when you are at home, so keep active, but don't over do it.  5. May return to work/school tomorrow or when you feel ready    SIGNS/SYMPTOMS TO CALL:  Common postoperative symptoms include urinary frequency, urgency, bladder spasm and blood in the urine  Please call us if you have a fever greater than 101.5, uncontrolled nausea/vomiting, uncontrolled pain, dizziness, unable to urinate, excessively bloody urine, chest pain, shortness of breath, leg swelling, leg pain, or any other concerns or questions.   You can reach [place] at [phone].   FOLLOW-UP:  1.  The office will contact you to schedule second look ureteroscopy and stent removal in 2 weeks  AMBULATORY SURGERY  DISCHARGE INSTRUCTIONS   The drugs that you were given will stay in your system until tomorrow so for the next 24 hours you should not:  Drive an automobile Make any legal decisions Drink any alcoholic beverage   You may resume regular meals tomorrow.  Today it is better to start with liquids and gradually work up to solid foods.  You may eat anything you prefer, but it is better to start with liquids, then soup and crackers, and gradually work up to solid foods.   Please notify your doctor immediately if you have any unusual bleeding, trouble breathing, redness and pain at the surgery site, drainage, fever, or pain not relieved by medication.    Additional Instructions:    Please contact your physician with any problems or Same Day Surgery at  [phone], Monday through Friday 6 am to 4 pm, or [place] at [facility] number at [phone].

## 2021-03-14 NOTE — Interval H&P Note (Signed)
History and Physical Interval Note:  03/14/2021 8:11 AM  Mariah Reed  has presented today for surgery, with the diagnosis of Right Nephrolithiasis, Right Ureteral Stone.  The various methods of treatment have been discussed with the patient and family. After consideration of risks, benefits and other options for treatment, the patient has consented to  Procedure(s): CYSTOSCOPY/URETEROSCOPY/HOLMIUM LASER/STENT PLACEMENT (Right) as a surgical intervention.  The patient's history has been reviewed, patient examined, no change in status, stable for surgery.  I have reviewed the patient's chart and labs.  Questions were answered to the patient's satisfaction.     [place]

## 2021-03-14 NOTE — Anesthesia Procedure Notes (Signed)
Procedure Name: Intubation Date/Time: 03/14/2021 8:35 AM Performed by: Rolla Plate, CRNA Pre-anesthesia Checklist: Patient identified, Patient being monitored, Timeout performed, Emergency Drugs available and Suction available Patient Re-evaluated:Patient Re-evaluated prior to induction Oxygen Delivery Method: Circle system utilized Preoxygenation: Pre-oxygenation with 100% oxygen Induction Type: IV induction Ventilation: Mask ventilation without difficulty Laryngoscope Size: Mac and 3 Grade View: Grade I Tube type: Oral Tube size: 7.0 mm Number of attempts: 1 Airway Equipment and Method: Stylet Placement Confirmation: ETT inserted through vocal cords under direct vision, positive ETCO2 and breath sounds checked- equal and bilateral Secured at: 21 cm Tube secured with: Tape Dental Injury: Teeth and Oropharynx as per pre-operative assessment

## 2021-03-14 NOTE — Anesthesia Preprocedure Evaluation (Signed)
Anesthesia Evaluation  Patient identified by MRN, date of birth, ID band Patient awake    Reviewed: Allergy & Precautions, NPO status , Patient's Chart, lab work & pertinent test results  History of Anesthesia Complications Negative for: history of anesthetic complications  Airway Mallampati: I   Neck ROM: Full    Dental  (+) Edentulous Upper, Edentulous Lower   Pulmonary COPD, former smoker (quit 1999),    Pulmonary exam normal breath sounds clear to auscultation       Cardiovascular hypertension, + CAD (s/p MI) and +CHF  Normal cardiovascular exam Rhythm:Regular Rate:Normal  Myocardial perfusion 01/26/21:  1. Apical scar favored over apical thinning. Also small region of inferior wall inducible ischemia near the apex. 2. Septal hypokinesis with mild septal dyskinesis. Mild anterior and inferior wall hypokinesis 3. Left ventricular ejection fraction 47% 4. 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   Cardiology note 03/13/21:  Mariah Reed is a 76 y.o. female with a hx of hypertension, hyperlipidemia, diabetes, COPD, former smoker presenting for preop evaluation.  Recently seen in the hospital 01/26/2021 due to symptoms of chest pain.   Underwent chest CTA 01/23/2021 showing mild LAD calcification disease. Echocardiogram 01/24/2021 EF 50 to 55%.  Normal diastolic function. Lexiscan Myoview  01/27/2021 no significant perfusion abnormalities, small inferior apical defect and apical thinning.  Medical therapy advised.  Aspirin was previously recommended, patient declined due to stomach upset.  Patient recently admitted for renal stone, s/p stent placement.  Urological procedure/cystoscopy, placement, possible stent removal is being planned.  She denies chest pain or shortness of breath.  Takes lovastatin, cannot tolerate Crestor.  Has a history of GI bleeds and stomach irritation, avoiding aspirin due to this.  1. Preop evaluation prior to urological procedure.  Your procedure is typically deemed low risk from the cardiac perspective.  Last echo with preserved EF, Lexiscan Myoview with no significant ischemia.  Okay to proceed with procedure from a cardiac perspective. 2. Hyperlipidemia, not tolerant of Crestor.  Increase lovastatin to 40 mg twice daily, start Zetia 10 mg daily.  Check lipid panel in 6 weeks. 3. LAD calcifications on chest CT.  Lovastatin, Zetia as above.  Avoiding aspirin due to gastric irritation/bleed.  Follow-up in 2 to 3 months.  Reproductive/Obstetrics                             Anesthesia Physical Anesthesia Plan  ASA: 3  Anesthesia Plan: General   Post-op Pain Management:    Induction: Intravenous  PONV Risk Score and Plan: 3 and Ondansetron, Dexamethasone and Treatment may vary due to age or medical condition  Airway Management Planned: Oral ETT  Additional Equipment:   Intra-op Plan:   Post-operative Plan: Extubation in OR  Informed Consent: I have reviewed the patients History and Physical, chart, labs and discussed the procedure including the risks, benefits and alternatives for the proposed anesthesia with the patient or authorized representative who has indicated his/her understanding and  acceptance.     Dental advisory given  Plan Discussed with: CRNA  Anesthesia Plan Comments: (Patient consented for  risks of anesthesia including but not limited to:  - adverse reactions to medications - damage to eyes, teeth, lips or other oral mucosa - nerve damage due to positioning  - sore throat or hoarseness - damage to heart, brain, nerves, lungs, other parts of body or loss of life  Informed patient about role of CRNA in peri- and intra-operative care.  Patient voiced understanding.)        Anesthesia Quick Evaluation

## 2021-03-14 NOTE — Transfer of Care (Signed)
Immediate Anesthesia Transfer of Care Note  Patient: Mariah Reed  Procedure(s) Performed: CYSTOSCOPY/URETEROSCOPY/HOLMIUM LASER/STENT PLACEMENT (Right: Ureter)  Patient Location: PACU  Anesthesia Type:General  Level of Consciousness: awake  Airway & Oxygen Therapy: Patient Spontanous Breathing and Patient connected to face mask oxygen  Post-op Assessment: Report given to RN and Post -op Vital signs reviewed and stable  Post vital signs: Reviewed  Last Vitals:  Vitals Value Taken Time  BP    Temp    Pulse 90 03/14/21 1011  Resp    SpO2 93 % 03/14/21 1011  Vitals shown include unvalidated device data.  Last Pain:         Complications: No notable events documented.

## 2021-03-18 LAB — CALCULI, WITH PHOTOGRAPH (CLINICAL LAB)
Carbonate Apatite: 30 %
Mg NH4 PO4 (Struvite): 70 %
Weight Calculi: 1 mg

## 2021-03-20 ENCOUNTER — Telehealth: Payer: Self-pay | Admitting: Urology

## 2021-03-20 MED ORDER — OXYBUTYNIN CHLORIDE 5 MG PO TABS: 5.0000 mg | ORAL_TABLET | Freq: Three times a day (TID) | ORAL | 0 refills | Status: DC | PRN

## 2021-03-20 NOTE — Telephone Encounter (Signed)
Pt would like to know if she needs an appt, as she is still in pain and the medication that was prescribed does not and has not been working. Would like a call back. Informed pt may not get a call back today.

## 2021-03-20 NOTE — Telephone Encounter (Signed)
Talked with patient she states when she eat she has right side pain . I talked with Dr. Bernardo Heater and we will call her some oxybutynin.

## 2021-03-21 ENCOUNTER — Other Ambulatory Visit: Payer: Self-pay | Admitting: Urology

## 2021-03-21 DIAGNOSIS — N200 Calculus of kidney: Secondary | ICD-10-CM

## 2021-03-21 NOTE — Progress Notes (Signed)
Surgical Physician Order Form [facility] [place]  * Scheduling expectation :  03/28/2021  *Length of Case: 45 minutes  *Clearance needed: no  *Anticoagulation Instructions: N/A  *Aspirin Instructions: N/A  *Post-op visit Date/Instructions:  1 month follow up  *Diagnosis: Right Nephrolithiasis  *Procedure: right  ureteroscopy with possible laser lithotripsy; stent removal versus exchange   Additional orders: N/A  -Admit type: OUTpatient  -Anesthesia: General  -VTE Prophylaxis Standing Order SCDs       Other:   -Standing Lab Orders Per Anesthesia    Lab other: UA&Urine Culture  -Standing Test orders EKG/Chest x-ray per Anesthesia       Test other:   - Medications:  Ceftriaxone(Rocephin) 1gm IV  -Other orders:  N/A

## 2021-03-23 ENCOUNTER — Telehealth: Payer: Self-pay | Admitting: Cardiology

## 2021-03-23 ENCOUNTER — Other Ambulatory Visit: Payer: Self-pay | Admitting: Urology

## 2021-03-23 ENCOUNTER — Telehealth: Payer: Self-pay | Admitting: Urology

## 2021-03-23 DIAGNOSIS — N3000 Acute cystitis without hematuria: Secondary | ICD-10-CM

## 2021-03-23 NOTE — Telephone Encounter (Signed)
Pt called and wanted to let you all know that the pharmacy would be calling in a refill for the Bactrim 800mg .

## 2021-03-23 NOTE — Telephone Encounter (Signed)
Patient was asking about Bactrim because she has a sinus infection . Advised patient she needs to talk with her Primary Care for this problems. She states she will call them .

## 2021-03-23 NOTE — Telephone Encounter (Signed)
Attempted to schedule patient overwhelmed and not ready .   Requested callback at end of march

## 2021-03-24 ENCOUNTER — Telehealth: Payer: Self-pay

## 2021-03-24 DIAGNOSIS — R059 Cough, unspecified: Secondary | ICD-10-CM | POA: Diagnosis not present

## 2021-03-24 DIAGNOSIS — J069 Acute upper respiratory infection, unspecified: Secondary | ICD-10-CM | POA: Diagnosis not present

## 2021-03-24 DIAGNOSIS — R300 Dysuria: Secondary | ICD-10-CM | POA: Diagnosis not present

## 2021-03-24 NOTE — Telephone Encounter (Signed)
I spoke with Mariah Reed. We have discussed possible surgery dates and Tuesday March 14th, 2023 was agreed upon by all parties. Patient given information about surgery date, what to expect pre-operatively and post operatively.   We discussed that a Pre-Admission Testing office will be calling to set up the pre-op visit that will take place prior to surgery, and that these appointments are typically done over the phone with a Pre-Admissions RN.   Informed patient that our office will communicate any additional care to be provided after surgery. Patients questions or concerns were discussed during our call. Advised to call our office should there be any additional information, questions or concerns that arise. Patient verbalized understanding.

## 2021-03-24 NOTE — Progress Notes (Signed)
[facility] Posting Form   Surgery Date/Time: Date: 04/18/2021  Surgeon: Dr. John Giovanni, MD  Surgery Location: Day Surgery  Inpt ( No  )   Outpt (Yes)   Obs ( No  )   Diagnosis: N20.0 Right Nephrolithiasis  -CPT: 34917  Surgery: Right Ureteroscopy with possible laser lithotripsy and stent removal versus exchange  Stop Anticoagulations: No  Cardiac/Medical/Pulmonary Clearance needed: no  *Orders entered into EPIC  Date: 03/24/21   *Case booked in EPIC  Date: 03/23/2021  *Notified pt of Surgery: Date: 03/23/2021  PRE-OP UA & CX: Yes will obtain on 04/04/2021  *Placed into Prior Authorization Work Fabio Bering Date: 03/24/21   Assistant/laser/rep:No

## 2021-03-28 ENCOUNTER — Telehealth: Payer: Self-pay | Admitting: Urology

## 2021-03-28 NOTE — Telephone Encounter (Signed)
Would like a call back to discuss medications she's been put on and wants to know what she should do about upcoming lab appt (urine) needed for procedure.

## 2021-03-28 NOTE — Telephone Encounter (Signed)
Patient called today and states her Doctor put her on a antibiotic for a sinus infection . She just wanted to make sure she still needs to give a ua for her surgery. Advised patient to still give ua sample.

## 2021-04-04 ENCOUNTER — Other Ambulatory Visit: Payer: Self-pay

## 2021-04-04 ENCOUNTER — Other Ambulatory Visit: Payer: Medicare Other

## 2021-04-04 DIAGNOSIS — N200 Calculus of kidney: Secondary | ICD-10-CM | POA: Diagnosis not present

## 2021-04-04 LAB — MICROSCOPIC EXAMINATION: RBC, Urine: >30 /hpf — AB (ref 0–2)

## 2021-04-04 LAB — URINALYSIS, COMPLETE
Bilirubin, UA: NEGATIVE
Glucose, UA: NEGATIVE
Ketones, UA: NEGATIVE
Nitrite, UA: NEGATIVE
Specific Gravity, UA: 1.025 (ref 1.005–1.030)
Urobilinogen, Ur: 0.2 mg/dL (ref 0.2–1.0)
pH, UA: 5.5 (ref 5.0–7.5)

## 2021-04-07 LAB — CULTURE, URINE COMPREHENSIVE

## 2021-04-14 ENCOUNTER — Other Ambulatory Visit
Admission: RE | Admit: 2021-04-14 | Discharge: 2021-04-14 | Disposition: A | Discharge status: Home / Self Care | Payer: Medicare Other | Source: Ambulatory Visit | Attending: Urology | Admitting: Urology

## 2021-04-14 ENCOUNTER — Telehealth: Payer: Self-pay

## 2021-04-14 ENCOUNTER — Other Ambulatory Visit: Payer: Self-pay

## 2021-04-14 HISTORY — DX: Unspecified osteoarthritis, unspecified site: M19.90

## 2021-04-14 HISTORY — DX: Angina pectoris, unspecified: I20.9

## 2021-04-14 HISTORY — DX: Anemia, unspecified: D64.9

## 2021-04-14 MED ORDER — FLUCONAZOLE 100 MG PO TABS: 100.0000 mg | ORAL_TABLET | Freq: Every day | ORAL | 0 refills | Status: DC

## 2021-04-14 NOTE — Patient Instructions (Addendum)
Your procedure is scheduled on: 04/18/21 - Tuesday ?Report to the Registration Desk on the 1st floor of the [facility]. ?To find out your arrival time, please call [phone] between 1PM - 3PM on: 04/17/21 - Monday ? ?REMEMBER: ?Instructions that are not followed completely may result in serious medical risk, up to and including death; or upon the discretion of your surgeon and anesthesiologist your surgery may need to be rescheduled. ? ?Do not eat food or drink any fluids after midnight the night before surgery.  ?No gum chewing, lozengers or hard candies. ? ?TAKE THESE MEDICATIONS THE MORNING OF SURGERY WITH A SIP OF WATER: ? ?- diphenoxylate-atropine (LOMOTIL) 2.5-0.025 MG tablet ?- esomeprazole (NEXIUM) 40 MG capsule, take one the night before and one on the morning of surgery - helps to prevent nausea after surgery. ?- fluticasone (FLONASE) 50 MCG/ACT nasal spray ?- levothyroxine (SYNTHROID) 75 MCG tablet ?- loperamide (IMODIUM) 2 MG capsule ?- metoprolol tartrate (LOPRESSOR) 25 MG tablet ?- NIFEdipine (ADALAT CC) 30 MG 24 hr tablet  ?- oxyCODONE (OXYCONTIN) 20 mg 12 hr tablet ?- PATADAY 0.2 % SOLN ?- pregabalin (LYRICA) 25 MG capsule ?- tiotropium (SPIRIVA HANDIHALER) 18 MCG inhalation capsule ?- topiramate (TOPAMAX) 100 MG tablet ? ?Use albuterol (VENTOLIN HFA) 108 (90 Base) MCG/ACT inhaler on the day of surgery and bring to the hospital. ? ?STOP TAKING empagliflozin (JARDIANCE) 25 MG TABS tablet beginning 04/15/21, may resume taking the day after surgery. ? ?Remove diclofenac (FLECTOR) 1.3 % PTCH before your procedure. ? ?One week prior to surgery: ?Stop Anti-inflammatories (NSAIDS) such as Advil, Aleve, Ibuprofen, Motrin, Naproxen, Naprosyn and Aspirin based products such as Excedrin, Goodys Powder, BC Powder. ? ?Stop ANY OVER THE COUNTER supplements until after surgery. ? ?You may however, continue to take Tylenol if needed for pain up until the day of surgery. ? ?No Alcohol for 24 hours before or  after surgery. ? ?No Smoking including e-cigarettes for 24 hours prior to surgery.  ?No chewable tobacco products for at least 6 hours prior to surgery.  ?No nicotine patches on the day of surgery. ? ?Do not use any "recreational" drugs for at least a week prior to your surgery.  ?Please be advised that the combination of cocaine and anesthesia may have negative outcomes, up to and including death. ?If you test positive for cocaine, your surgery will be cancelled. ? ?On the morning of surgery brush your teeth with toothpaste and water, you may rinse your mouth with mouthwash if you wish. ?Do not swallow any toothpaste or mouthwash. ? ?Do not wear jewelry, make-up, hairpins, clips or nail polish. ? ?Do not wear lotions, powders, or perfumes.  ? ?Do not shave body from the neck down 48 hours prior to surgery just in case you cut yourself which could leave a site for infection.  ?Also, freshly shaved skin may become irritated if using the CHG soap. ? ?Contact lenses, hearing aids and dentures may not be worn into surgery. ? ?Do not bring valuables to the hospital. [facility] is not responsible for any missing/lost belongings or valuables.  ? ?Notify your doctor if there is any change in your medical condition (cold, fever, infection). ? ?Wear comfortable clothing (specific to your surgery type) to the hospital. ? ?After surgery, you can help prevent lung complications by doing breathing exercises.  ?Take deep breaths and cough every 1-2 hours. Your doctor may order a device called an Incentive Spirometer to help you take deep breaths. ?When coughing or sneezing, hold  a pillow firmly against your incision with both hands. This is called ?splinting.? Doing this helps protect your incision. It also decreases belly discomfort. ? ?If you are being admitted to the hospital overnight, leave your suitcase in the car. ?After surgery it may be brought to your room. ? ?If you are being discharged the day of surgery, you will not  be allowed to drive home. ?You will need a responsible adult (18 years or older) to drive you home and stay with you that night.  ? ?If you are taking public transportation, you will need to have a responsible adult (18 years or older) with you. ?Please confirm with your physician that it is acceptable to use public transportation.  ? ?Please call the [place] Dept. at [phone] if you have any questions about these instructions. ? ?Surgery Visitation Policy: ? ?Patients undergoing a surgery or procedure may have one family member or support person with them as long as that person is not COVID-19 positive or experiencing its symptoms.  ?That person may remain in the waiting area during the procedure and may rotate out with other people. ? ?Inpatient Visitation:   ? ?Visiting hours are 7 a.m. to 8 p.m. ?Up to two visitors ages 16+ are allowed at one time in a patient room. The visitors may rotate out with other people during the day. Visitors must check out when they leave, or other visitors will not be allowed. One designated support person may remain overnight. ?The visitor must pass COVID-19 screenings, use hand sanitizer when entering and exiting the patient?s room and wear a mask at all times, including in the patient?s room. ?Patients must also wear a mask when staff or their visitor are in the room. ?Masking is required regardless of vaccination status.  ?

## 2021-04-14 NOTE — Telephone Encounter (Signed)
Spoke with pt. Pt. Advised of results and verbalized understanding. Medication has been sent to [facility] per patient request. ?

## 2021-04-14 NOTE — Telephone Encounter (Signed)
-----   Message from Abbie Sons, MD sent at 04/13/2021  4:39 PM EST ----- ?Urine culture grew a low level of yeast.  Please send in Rx fluconazole 100 mg daily x5 days starting 04/14/2021 ?

## 2021-05-02 ENCOUNTER — Other Ambulatory Visit: Payer: Self-pay

## 2021-05-02 MED ORDER — LOVASTATIN 40 MG PO TABS: 40.0000 mg | ORAL_TABLET | Freq: Two times a day (BID) | ORAL | 0 refills | Status: DC

## 2021-05-03 ENCOUNTER — Other Ambulatory Visit: Payer: Self-pay

## 2021-05-03 DIAGNOSIS — N200 Calculus of kidney: Secondary | ICD-10-CM

## 2021-05-04 ENCOUNTER — Other Ambulatory Visit: Payer: Medicare Other

## 2021-05-04 DIAGNOSIS — N200 Calculus of kidney: Secondary | ICD-10-CM | POA: Diagnosis not present

## 2021-05-04 LAB — URINALYSIS, COMPLETE
Bilirubin, UA: NEGATIVE
Ketones, UA: NEGATIVE
Nitrite, UA: NEGATIVE
Specific Gravity, UA: 1.015 (ref 1.005–1.030)
Urobilinogen, Ur: 0.2 mg/dL (ref 0.2–1.0)
pH, UA: 7 (ref 5.0–7.5)

## 2021-05-04 LAB — MICROSCOPIC EXAMINATION
Epithelial Cells (non renal): NONE SEEN /hpf (ref 0–10)
WBC, UA: >30 /hpf — AB (ref 0–5)

## 2021-05-08 ENCOUNTER — Other Ambulatory Visit: Payer: Self-pay | Admitting: Urology

## 2021-05-08 ENCOUNTER — Telehealth: Payer: Self-pay

## 2021-05-08 LAB — CULTURE, URINE COMPREHENSIVE

## 2021-05-08 MED ORDER — SULFAMETHOXAZOLE-TRIMETHOPRIM 800-160 MG PO TABS: 1.0000 | ORAL_TABLET | Freq: Two times a day (BID) | ORAL | 0 refills | Status: DC

## 2021-05-08 NOTE — Telephone Encounter (Signed)
-----   Message from Abbie Sons, MD sent at 05/08/2021  7:19 AM EDT ----- ?Preop urine culture growing Proteus.  Most likely colonization.  If no UTI symptoms please send in Rx Septra DS 1 twice daily x7 days and start Thursday, 05/11/2021. ?

## 2021-05-08 NOTE — Telephone Encounter (Signed)
Incoming call on triage line from pt in regards to Bactrim Rx. Pt states [facility] did not receive Rx. [facility] who states also they did not receive Rx. Resent new Rx. ?

## 2021-05-08 NOTE — Addendum Note (Signed)
Addended by: Evelina Bucy on: 05/08/2021 02:25 PM ? ? Modules accepted: Orders ? ?

## 2021-05-08 NOTE — Telephone Encounter (Signed)
Spoke with pt. Pt. Advised and I have sent medication in to [facility] per patient request.  ?

## 2021-05-10 ENCOUNTER — Telehealth: Payer: Self-pay

## 2021-05-10 NOTE — Telephone Encounter (Signed)
Attempted to schedule patient previously declined not ready  ? ?Mailed recall  ?

## 2021-05-12 ENCOUNTER — Ambulatory Visit: Payer: Medicare Other | Admitting: Cardiology

## 2021-05-15 ENCOUNTER — Encounter
Admission: RE | Admit: 2021-05-15 | Discharge: 2021-05-15 | Disposition: A | Discharge status: Home / Self Care | Payer: Medicare Other | Source: Ambulatory Visit | Attending: Urology | Admitting: Urology

## 2021-05-15 MED ORDER — SODIUM CHLORIDE 0.9 % IV SOLN
1.0000 g | INTRAVENOUS | Status: AC
  Administered 2021-05-16: 1 g via INTRAVENOUS
  Filled 2021-05-15: qty 1

## 2021-05-15 MED ORDER — CHLORHEXIDINE GLUCONATE 0.12 % MT SOLN
15.0000 mL | Freq: Once | OROMUCOSAL | Status: AC
Start: 2021-05-15 — End: 2021-05-16

## 2021-05-15 MED ORDER — SODIUM CHLORIDE 0.9 % IV SOLN: INTRAVENOUS | Status: DC

## 2021-05-15 MED ORDER — ORAL CARE MOUTH RINSE: 15.0000 mL | Freq: Once | OROMUCOSAL | Status: AC

## 2021-05-15 NOTE — Patient Instructions (Addendum)
Your procedure is scheduled on: Tuesday 05/16/21 ?Report to the Registration Desk on the 1st floor of the [facility]. ?To find out your arrival time, please call [phone] between 1PM - 3PM on: Monday 05/15/21 ? ?REMEMBER: ?Instructions that are not followed completely may result in serious medical risk, up to and including death; or upon the discretion of your surgeon and anesthesiologist your surgery may need to be rescheduled. ? ?Do not eat or drink after midnight the night before surgery.  ?No gum chewing, lozengers or hard candies. ? ?TAKE THESE MEDICATIONS THE MORNING OF SURGERY WITH A SIP OF WATER: ?levothyroxine (SYNTHROID) 75 MCG tablet ?lovastatin (MEVACOR) 40 MG tablet ?metoprolol tartrate (LOPRESSOR) 25 MG tablet ?NIFEdipine (ADALAT CC) 30 MG 24 hr tablet ?oxyCODONE (OXYCONTIN) 20 mg 12 hr tablet ?pregabalin (LYRICA) 25 MG capsule ?tiotropium (SPIRIVA HANDIHALER) 18 MCG inhalation capsule ?topiramate (TOPAMAX) 100 MG tablet ? ? ?esomeprazole (NEXIUM) 40 MG capsule (take one the night before and one on the morning of surgery - helps to prevent nausea after surgery.) ? ?Use albuterol (VENTOLIN HFA) 108 (90 Base) MCG/ACT inhaler inhalers on the day of surgery and bring to the hospital. ? ?Hold your Jardiance today. ? ?Remove diclofenac (FLECTOR) 1.3 % PTCH before the procedure. ? ?One week prior to surgery: ?Stop Anti-inflammatories (NSAIDS) such as Advil, Aleve, Ibuprofen, Motrin, Naproxen, Naprosyn and Aspirin based products such as Excedrin, Goodys Powder, BC Powder. ? ?Stop ANY OVER THE COUNTER supplements until after surgery. ? ?You may however, continue to take Tylenol if needed for pain up until the day of surgery. ? ?No Alcohol for 24 hours before or after surgery. ? ?No Smoking including e-cigarettes for 24 hours prior to surgery.  ?No chewable tobacco products for at least 6 hours prior to surgery.  ?No nicotine patches on the day of surgery. ? ?Do not use any "recreational" drugs for at  least a week prior to your surgery.  ?Please be advised that the combination of cocaine and anesthesia may have negative outcomes, up to and including death. ?If you test positive for cocaine, your surgery will be cancelled. ? ?On the morning of surgery brush your teeth with toothpaste and water, you may rinse your mouth with mouthwash if you wish. ?Do not swallow any toothpaste or mouthwash. ? ?Do not wear jewelry, make-up, hairpins, clips or nail polish. ? ?Do not wear lotions, powders, or perfumes.  ? ?Do not shave body from the neck down 48 hours prior to surgery just in case you cut yourself which could leave a site for infection.  ?Also, freshly shaved skin may become irritated if using the CHG soap. ? ?dentures may not be worn into surgery. ? ?Do not bring valuables to the hospital. [facility] is not responsible for any missing/lost belongings or valuables.  ? ?Notify your doctor if there is any change in your medical condition (cold, fever, infection). ? ?Wear comfortable clothing (specific to your surgery type) to the hospital. ? ?If you are being discharged the day of surgery, you will not be allowed to drive home. ?You will need a responsible adult (18 years or older) to drive you home and stay with you that night.  ? ?If you are taking public transportation, you will need to have a responsible adult (18 years or older) with you. ?Please confirm with your physician that it is acceptable to use public transportation.  ? ?Please call the [place] Dept. at [phone] if you have any questions about these instructions. ? ?  Surgery Visitation Policy: ? ?Patients undergoing a surgery or procedure may have two family members or support persons with them as long as the person is not COVID-19 positive or experiencing its symptoms.  ? ?Inpatient Visitation:   ? ?Visiting hours are 7 a.m. to 8 p.m. ?Up to four visitors are allowed at one time in a patient room, including children. The visitors may  rotate out with other people during the day. One designated support person (adult) may remain overnight.  ?

## 2021-05-15 NOTE — Progress Notes (Signed)
Patient stated she wants her d/c instructions given to her only. She does not want anyone else to have her surgical information. ?

## 2021-05-15 NOTE — Progress Notes (Signed)
Patient was given medications to stop and also ones to take the morning of the procedure. She would not write the medications down therefore she may not remember all of the ones she was given. Patient was asked to write them down and she refused. ?

## 2021-05-16 ENCOUNTER — Encounter
Admission: RE | Disposition: A | Discharge status: Home / Self Care | Payer: Self-pay | Source: Home / Self Care | Attending: Urology

## 2021-05-16 ENCOUNTER — Encounter: Payer: Self-pay | Admitting: Urology

## 2021-05-16 ENCOUNTER — Ambulatory Visit: Payer: Medicare Other | Admitting: Anesthesiology

## 2021-05-16 ENCOUNTER — Other Ambulatory Visit: Payer: Self-pay

## 2021-05-16 ENCOUNTER — Ambulatory Visit
Admission: RE | Admit: 2021-05-16 | Discharge: 2021-05-16 | Disposition: A | Discharge status: Home / Self Care | Payer: Medicare Other | Attending: Urology | Admitting: Urology

## 2021-05-16 ENCOUNTER — Ambulatory Visit: Payer: Medicare Other

## 2021-05-16 DIAGNOSIS — N202 Calculus of kidney with calculus of ureter: Secondary | ICD-10-CM | POA: Insufficient documentation

## 2021-05-16 DIAGNOSIS — N200 Calculus of kidney: Secondary | ICD-10-CM

## 2021-05-16 DIAGNOSIS — N134 Hydroureter: Secondary | ICD-10-CM | POA: Diagnosis not present

## 2021-05-16 DIAGNOSIS — N1330 Unspecified hydronephrosis: Secondary | ICD-10-CM | POA: Diagnosis not present

## 2021-05-16 DIAGNOSIS — I509 Heart failure, unspecified: Secondary | ICD-10-CM | POA: Diagnosis not present

## 2021-05-16 DIAGNOSIS — I110 Hypertensive heart disease with heart failure: Secondary | ICD-10-CM | POA: Diagnosis not present

## 2021-05-16 DIAGNOSIS — E119 Type 2 diabetes mellitus without complications: Secondary | ICD-10-CM | POA: Insufficient documentation

## 2021-05-16 DIAGNOSIS — E785 Hyperlipidemia, unspecified: Secondary | ICD-10-CM | POA: Diagnosis not present

## 2021-05-16 HISTORY — PX: CYSTOSCOPY/URETEROSCOPY/HOLMIUM LASER/STENT PLACEMENT: SHX6546

## 2021-05-16 LAB — GLUCOSE, CAPILLARY
Glucose-Capillary: 177 mg/dL — ABNORMAL HIGH (ref 70–99)
Glucose-Capillary: 173 mg/dL — ABNORMAL HIGH (ref 70–99)

## 2021-05-16 SURGERY — CYSTOSCOPY/URETEROSCOPY/HOLMIUM LASER/STENT PLACEMENT
Anesthesia: General | Laterality: Right

## 2021-05-16 MED ORDER — PHENYLEPHRINE 40 MCG/ML (10ML) SYRINGE FOR IV PUSH (FOR BLOOD PRESSURE SUPPORT)
PREFILLED_SYRINGE | INTRAVENOUS | Status: DC | PRN
  Administered 2021-05-16 (×4): 80 ug via INTRAVENOUS

## 2021-05-16 MED ORDER — FENTANYL CITRATE (PF) 100 MCG/2ML IJ SOLN
INTRAMUSCULAR | Status: AC
  Filled 2021-05-16: qty 2

## 2021-05-16 MED ORDER — OXYCODONE HCL 5 MG PO TABS: 5.0000 mg | ORAL_TABLET | Freq: Once | ORAL | Status: DC | PRN

## 2021-05-16 MED ORDER — CHLORHEXIDINE GLUCONATE 0.12 % MT SOLN
OROMUCOSAL | Status: AC
  Administered 2021-05-16: 15 mL via OROMUCOSAL
  Filled 2021-05-16: qty 15

## 2021-05-16 MED ORDER — IOHEXOL 180 MG/ML  SOLN
INTRAMUSCULAR | Status: DC | PRN
  Administered 2021-05-16: 20 mL

## 2021-05-16 MED ORDER — ONDANSETRON HCL 4 MG/2ML IJ SOLN
INTRAMUSCULAR | Status: AC
  Filled 2021-05-16: qty 2

## 2021-05-16 MED ORDER — ONDANSETRON HCL 4 MG/2ML IJ SOLN
4.0000 mg | Freq: Once | INTRAMUSCULAR | Status: AC | PRN
  Administered 2021-05-16: 4 mg via INTRAVENOUS

## 2021-05-16 MED ORDER — VASOPRESSIN 20 UNIT/ML IV SOLN
INTRAVENOUS | Status: AC
  Filled 2021-05-16: qty 1

## 2021-05-16 MED ORDER — LIDOCAINE HCL (CARDIAC) PF 100 MG/5ML IV SOSY
PREFILLED_SYRINGE | INTRAVENOUS | Status: DC | PRN
  Administered 2021-05-16: 80 mg via INTRAVENOUS

## 2021-05-16 MED ORDER — MIDAZOLAM HCL 2 MG/2ML IJ SOLN
INTRAMUSCULAR | Status: DC | PRN
  Administered 2021-05-16: 2 mg via INTRAVENOUS

## 2021-05-16 MED ORDER — OXYCODONE HCL 5 MG/5ML PO SOLN: 5.0000 mg | Freq: Once | ORAL | Status: DC | PRN

## 2021-05-16 MED ORDER — FENTANYL CITRATE (PF) 100 MCG/2ML IJ SOLN: 25.0000 ug | INTRAMUSCULAR | Status: DC | PRN

## 2021-05-16 MED ORDER — PROPOFOL 10 MG/ML IV BOLUS
INTRAVENOUS | Status: DC | PRN
  Administered 2021-05-16: 100 mg via INTRAVENOUS

## 2021-05-16 MED ORDER — FENTANYL CITRATE (PF) 100 MCG/2ML IJ SOLN
INTRAMUSCULAR | Status: DC | PRN
  Administered 2021-05-16: 50 ug via INTRAVENOUS

## 2021-05-16 MED ORDER — VASOPRESSIN 20 UNIT/ML IV SOLN
INTRAVENOUS | Status: DC | PRN
  Administered 2021-05-16: 1 [IU] via INTRAVENOUS

## 2021-05-16 MED ORDER — LIDOCAINE HCL (PF) 2 % IJ SOLN
INTRAMUSCULAR | Status: AC
  Filled 2021-05-16: qty 5

## 2021-05-16 MED ORDER — EPHEDRINE SULFATE (PRESSORS) 50 MG/ML IJ SOLN
INTRAMUSCULAR | Status: DC | PRN
  Administered 2021-05-16 (×2): 10 mg via INTRAVENOUS

## 2021-05-16 MED ORDER — ACETAMINOPHEN 10 MG/ML IV SOLN: 1000.0000 mg | Freq: Once | INTRAVENOUS | Status: DC | PRN

## 2021-05-16 MED ORDER — MIDAZOLAM HCL 2 MG/2ML IJ SOLN
INTRAMUSCULAR | Status: AC
  Filled 2021-05-16: qty 2

## 2021-05-16 MED ORDER — ONDANSETRON HCL 4 MG/2ML IJ SOLN
INTRAMUSCULAR | Status: DC | PRN
  Administered 2021-05-16: 4 mg via INTRAVENOUS

## 2021-05-16 SURGICAL SUPPLY — 29 items
BAG DRAIN CYSTO-URO LG1000N (MISCELLANEOUS) ×2 IMPLANT
BASKET ZERO TIP 1.9FR (BASKET) ×1 IMPLANT
BRUSH SCRUB EZ 1% IODOPHOR (MISCELLANEOUS) ×2 IMPLANT
CATH URET FLEX-TIP 2 LUMEN 10F (CATHETERS) IMPLANT
CATH URETL OPEN END 6X70 (CATHETERS) IMPLANT
CNTNR SPEC 2.5X3XGRAD LEK (MISCELLANEOUS)
CONT SPEC 4OZ STER OR WHT (MISCELLANEOUS)
CONT SPEC 4OZ STRL OR WHT (MISCELLANEOUS)
CONTAINER SPEC 2.5X3XGRAD LEK (MISCELLANEOUS) IMPLANT
DRAPE UTILITY 15X26 TOWEL STRL (DRAPES) ×2 IMPLANT
GAUZE 4X4 16PLY ~~LOC~~+RFID DBL (SPONGE) ×3 IMPLANT
GLOVE SURG UNDER POLY LF SZ7.5 (GLOVE) ×2 IMPLANT
GOWN STRL REUS W/ TWL LRG LVL3 (GOWN DISPOSABLE) ×1 IMPLANT
GOWN STRL REUS W/ TWL XL LVL3 (GOWN DISPOSABLE) ×1 IMPLANT
GOWN STRL REUS W/TWL LRG LVL3 (GOWN DISPOSABLE) ×2
GOWN STRL REUS W/TWL XL LVL3 (GOWN DISPOSABLE) ×2
GUIDEWIRE STR DUAL SENSOR (WIRE) ×2 IMPLANT
INFUSOR MANOMETER BAG 3000ML (MISCELLANEOUS) ×1 IMPLANT
IV NS IRRIG 3000ML ARTHROMATIC (IV SOLUTION) ×2 IMPLANT
KIT TURNOVER CYSTO (KITS) ×2 IMPLANT
PACK CYSTO AR (MISCELLANEOUS) ×2 IMPLANT
SET CYSTO W/LG BORE CLAMP LF (SET/KITS/TRAYS/PACK) ×2 IMPLANT
SHEATH URETERAL 12FRX35CM (MISCELLANEOUS) IMPLANT
STENT URET 6FRX24 CONTOUR (STENTS) ×1 IMPLANT
STENT URET 6FRX26 CONTOUR (STENTS) IMPLANT
SURGILUBE 2OZ TUBE FLIPTOP (MISCELLANEOUS) ×2 IMPLANT
TRACTIP FLEXIVA PULSE ID 200 (Laser) ×2 IMPLANT
VALVE UROSEAL ADJ ENDO (VALVE) ×1 IMPLANT
WATER STERILE IRR 500ML POUR (IV SOLUTION) ×2 IMPLANT

## 2021-05-16 NOTE — H&P (Signed)
? ?Urology H&P ? ? ?History of Present Illness: Mariah Reed is a 76 y.o. female who underwent right ureteroscopy with laser lithotripsy of a partial staghorn right renal calculus 03/14/2021.  A 2-week follow-up ureteroscopy was recommended however had to be rescheduled on several occasions.  Preoperative urine culture grew Proteus.  She was started on antibiotics last week and has been asymptomatic ? ?Past Medical History:  ?Diagnosis Date  ? Allergy   ? Anemia   ? Anginal pain ([place])   ? Arthritis   ? CHF (congestive heart failure) ([place])   ? COPD (chronic obstructive pulmonary disease) ([place])   ? Diabetes mellitus   ? GERD (gastroesophageal reflux disease)   ? Gout   ? Headache   ? History of kidney stones   ? Hyperlipidemia   ? Hypertension   ? Hypothyroidism   ? LBBB (left bundle branch block)   ? Low back pain   ? Myocardial infarction [facility])   ? Nephrolithiasis   ? Peripheral neuropathy   ? Pneumonia   ? Rhabdomyolysis   ? a.) noted during 12/16-12/25/2022 admission for sepsis 2/2 RIGHT ureteral and RIGHT staghorn nephrolithiasis.  ? Sepsis ([place]) 01/20/2021  ? a.) sepsis 2/2 RIGHT ureteral and RIGHT staghorn nephrolithiasis.  ? ? ?Past Surgical History:  ?Procedure Laterality Date  ? ABDOMINAL HYSTERECTOMY    ? CHOLECYSTECTOMY  unknown  ? Patient stated she does not remember when it was removed. (possibly 1997)  ? COLONOSCOPY    ? CYSTOSCOPY WITH RETROGRADE PYELOGRAM, URETEROSCOPY AND STENT PLACEMENT Right 01/20/2021  ? Procedure: CYSTOSCOPY WITH RETROGRADE PYELOGRAM, URETEROSCOPY AND STENT PLACEMENT;  Surgeon: Janith Lima, MD;  Location: [facility];  Service: Urology;  Laterality: Right;  ? CYSTOSCOPY/URETEROSCOPY/HOLMIUM LASER/STENT PLACEMENT Right 03/14/2021  ? Procedure: CYSTOSCOPY/URETEROSCOPY/HOLMIUM LASER/STENT PLACEMENT;  Surgeon: Abbie Sons, MD;  Location: [facility] ORS;  Service: Urology;  Laterality: Right;  ? POLYPECTOMY    ? Colon  ? TUBAL LIGATION    ? ? ?Home Medications:  ?Current Meds   ?Medication Sig  ? ACCU-CHEK FASTCLIX LANCETS MISC 1 Device by Does not apply route 3 (three) times daily. Use to check blood sugar up to three times a day  ? acetaminophen (TYLENOL) 325 MG tablet Take 2 tablets (650 mg total) by mouth every 6 (six) hours as needed for mild pain (or Fever >/= 101).  ? albuterol (VENTOLIN HFA) 108 (90 Base) MCG/ACT inhaler INHALE 2 PUFFS INTO THE LUNGS EVERY 4 HOURS AS NEEDED  ? alum & mag hydroxide-simeth (MAALOX/MYLANTA) 200-200-20 MG/5ML suspension Take 30 mLs by mouth 2 (two) times daily.  ? benzonatate (TESSALON) 100 MG capsule Take 1 capsule (100 mg total) by mouth 3 (three) times daily as needed for cough.  ? chlorpheniramine (CHLOR-TRIMETON) 4 MG tablet Take 4 mg by mouth 2 (two) times daily as needed for allergies.  ? Cyanocobalamin (B-12 PO) Take 1 tablet by mouth daily.  ? diclofenac (FLECTOR) 1.3 % PTCH Place 1 patch onto the skin 2 (two) times daily.  ? diclofenac sodium (VOLTAREN) 1 % GEL APPLY 2 GRAMS TOPICALLY 4 TIMES DAILY (Patient taking differently: as needed.)  ? diphenoxylate-atropine (LOMOTIL) 2.5-0.025 MG tablet Take 2 tablets by mouth in the morning, at noon, and at bedtime. Take with imodium  ? empagliflozin (JARDIANCE) 25 MG TABS tablet Take 25 mg by mouth daily after lunch.  ? esomeprazole (NEXIUM) 40 MG capsule TAKE 1 CAPSULE BY MOUTH ONCE DAILY  ? FEXOFENADINE HCL PO Take 0.5 tablets by mouth daily.  ?  fluconazole (DIFLUCAN) 100 MG tablet Take 1 tablet (100 mg total) by mouth daily. X 7 days  ? fluticasone (FLONASE) 50 MCG/ACT nasal spray Place 2 sprays into both nostrils daily.  ? glipiZIDE (GLUCOTROL) 10 MG tablet Take 1 tablet (10 mg total) by mouth 2 (two) times daily before a meal.  ? guaiFENesin (MUCINEX) 600 MG 12 hr tablet Take by mouth at bedtime.  ? hydrOXYzine (ATARAX/VISTARIL) 50 MG tablet Take 1 tablet (50 mg total) by mouth 3 (three) times daily as needed.  ? levothyroxine (SYNTHROID) 75 MCG tablet Take 1 tablet (75 mcg total) by mouth  daily.  ? lipase/protease/amylase (CREON) 36000 UNITS CPEP capsule Take 36,000-72,000 Units by mouth See admin instructions. Take 2 capsules (72000 units) by mouth three times daily with meals, take 1 capsule (36000 units) with snacks or drinks  ? loperamide (IMODIUM) 2 MG capsule Take 8 mg by mouth 2 (two) times daily. Take with lomotil  ? lovastatin (MEVACOR) 40 MG tablet Take 1 tablet (40 mg total) by mouth 2 (two) times daily.  ? meclizine (ANTIVERT) 25 MG tablet Take 25 mg by mouth 3 (three) times daily as needed for dizziness.  ? NIFEdipine (ADALAT CC) 30 MG 24 hr tablet Take 1 tablet (30 mg total) by mouth daily.  ? nitroGLYCERIN (NITROSTAT) 0.4 MG SL tablet Take SL prn chest pain/May repeat 5 min apart up to 3 times prn  ? nystatin cream (MYCOSTATIN) APPLY TO AFFECTED AREAS ONCE DAILY AS NEEDED  ? PATADAY 0.2 % SOLN PLACE 1 DROP INTO BOTH EYES EVERY DAY  ? pregabalin (LYRICA) 25 MG capsule Take 1 capsule (25 mg total) by mouth 2 (two) times daily.  ? sulfamethoxazole-trimethoprim (BACTRIM DS) 800-160 MG tablet Take 1 tablet by mouth every 12 (twelve) hours.  ? tiotropium (SPIRIVA HANDIHALER) 18 MCG inhalation capsule INHALE THE CONTENTS OF 1 CAPSUOLE VIA HANDIHALER ONCE DAILY  ? tiZANidine (ZANAFLEX) 4 MG tablet Take 4 mg by mouth See admin instructions. Take one tablet (4 mg) by mouth twice daily - at bedtime and 1am  ? topiramate (TOPAMAX) 100 MG tablet Take 3 tablets (300 mg total) by mouth daily.  ? zolpidem (AMBIEN) 5 MG tablet Take 1 tablet (5 mg total) by mouth at bedtime as needed. for sleep  ? ? ?Allergies:  ?Allergies  ?Allergen Reactions  ? Levofloxacin Other (See Comments)  ?  Made head feel weird  ? Nortriptyline Other (See Comments)  ?  Caused rectal bleeding ?  ? Tape Other (See Comments)  ?  Breaks out with Medical Tape   ? ? ?Family History  ?Problem Relation Age of Onset  ? Uterine cancer Mother   ? ? ?Social History:  reports that she has quit smoking. Her smoking use included cigarettes.  She has a 25.00 pack-year smoking history. She has never used smokeless tobacco. She reports that she does not drink alcohol and does not use drugs. ? ?ROS: ?Noncontributory except as per the HPI ? ?Physical Exam:  ?Vital signs in last 24 hours: ?Temp:  [97.8 ?F (36.6 ?C)] 97.8 ?F (36.6 ?C) (04/11 2010) ?Pulse Rate:  [94] 94 (04/11 0721) ?Resp:  [18] 18 (04/11 0712) ?BP: (162)/(84) 162/84 (04/11 0721) ?SpO2:  [98 %] 98 % (04/11 0721) ?Weight:  [83.5 kg] 83.5 kg (04/11 0721) ?Constitutional:  Alert and oriented, No acute distress ?HEENT: [place] AT, moist mucus membranes.  Trachea midline, no masses ?Cardiovascular: Regular rate and rhythm ?Respiratory: Normal respiratory effort, lungs clear bilaterally ?GI: Abdomen is soft,  nontender, nondistended, no abdominal masses ? ?Laboratory Data:  ?No results for input(s): WBC, HGB, HCT in the last 72 hours. ?No results for input(s): NA, K, CL, CO2, GLUCOSE, BUN, CREATININE, CALCIUM in the last 72 hours. ?No results for input(s): LABPT, INR in the last 72 hours. ?No results for input(s): LABURIN in the last 72 hours. ?Results for orders placed or performed in visit on 05/04/21  ?CULTURE, URINE COMPREHENSIVE     Status: Abnormal  ? Collection Time: 05/04/21 11:22 AM  ? Specimen: Urine  ? UR  ?Result Value Ref Range Status  ? Urine Culture, Comprehensive Final report (A)  Final  ? Organism ID, Bacteria Proteus mirabilis (A)  Final  ?  Comment: Cefazolin <=4 ug/mL ?Cefazolin with an MIC <=16 predicts susceptibility to the oral agents ?cefaclor, cefdinir, cefpodoxime, cefprozil, cefuroxime, cephalexin, ?and loracarbef when used for therapy of uncomplicated urinary tract ?infections due to E. coli, Klebsiella pneumoniae, and Proteus ?mirabilis. ?Greater than 100,000 colony forming units per mL ?  ? ANTIMICROBIAL SUSCEPTIBILITY Comment  Final  ?  Comment:       ** S = Susceptible; I = Intermediate; R = Resistant ** ?                   P = Positive; N = Negative ?            MICS are  expressed in micrograms per mL ?   Antibiotic                 RSLT#1    RSLT#2    RSLT#3    RSLT#4 ?Amoxicillin/Clavulanic Acid    S ?Ampicillin                     S ?Cefepime                       S ?Ceftriaxone

## 2021-05-16 NOTE — Interval H&P Note (Signed)
History and Physical Interval Note: ? ?05/16/2021 ?8:17 AM ? ?Mariah Reed  has presented today for surgery, with the diagnosis of Right Nephrolithiasis.  The various methods of treatment have been discussed with the patient and family. After consideration of risks, benefits and other options for treatment, the patient has consented to  Procedure(s): ?CYSTOSCOPY/URETEROSCOPY/HOLMIUM LASER/STENT REMOVAL VS EXCHANGE (Right) as a surgical intervention.  The patient's history has been reviewed, patient examined, no change in status, stable for surgery.  I have reviewed the patient's chart and labs.  Questions were answered to the patient's satisfaction.   ? ? ?Ayvah Caroll C Glade Strausser ? ? ?

## 2021-05-16 NOTE — Transfer of Care (Signed)
Immediate Anesthesia Transfer of Care Note ? ?Patient: Mariah Reed ? ?Procedure(s) Performed: CYSTOSCOPY/URETEROSCOPY/HOLMIUM LASER/STENT REMOVAL VS EXCHANGE (Right) ? ?Patient Location: PACU ? ?Anesthesia Type:General ? ?Level of Consciousness: awake, alert  and oriented ? ?Airway & Oxygen Therapy: Patient Spontanous Breathing and Patient connected to face mask oxygen ? ?Post-op Assessment: Report given to RN, Post -op Vital signs reviewed and stable and Patient moving all extremities X 4 ? ?Post vital signs: Reviewed and stable ? ?Last Vitals:  ?Vitals Value Taken Time  ?BP 155/129 05/16/21 0940  ?Temp    ?Pulse 123 05/16/21 0944  ?Resp 16 05/16/21 0944  ?SpO2 82 % 05/16/21 0944  ?Vitals shown include unvalidated device data. ? ?Last Pain:  ?Vitals:  ? 05/16/21 0721  ?TempSrc: Temporal  ?   ? ?  ? ?Complications: No notable events documented. ?

## 2021-05-16 NOTE — Anesthesia Preprocedure Evaluation (Signed)
Anesthesia Evaluation  ?Patient identified by MRN, date of birth, ID band ?Patient awake ? ? ? ?Reviewed: ?Allergy & Precautions, NPO status , Patient's Chart, lab work & pertinent test results ? ?History of Anesthesia Complications ?Negative for: history of anesthetic complications ? ?Airway ?Mallampati: II ? ?TM Distance: >3 FB ?Neck ROM: Full ? ? ? Dental ? ?(+) Edentulous Upper, Edentulous Lower ?  ?Pulmonary ?COPD,  COPD inhaler, former smoker,  ?  ?Pulmonary exam normal ?breath sounds clear to auscultation ? ? ? ? ? ? Cardiovascular ?hypertension, + CAD (s/p MI), + Past MI and +CHF  ?Normal cardiovascular exam ?Rhythm:Regular Rate:Normal ? ?Myocardial perfusion 01/26/21:  ?1. Apical scar favored over apical thinning. Also small region of inferior wall inducible ischemia near the apex. ?2. Septal hypokinesis with mild septal dyskinesis. Mild anterior and inferior wall hypokinesis ?3. Left ventricular ejection fraction 47% ?4. Non invasive risk stratification*: Intermediate ? ?ECG 01/26/21:  ?Sinus rhythm with Premature atrial complexes ?Left bundle branch block ?  ?Neuro/Psych ? Headaches,  Neuromuscular disease (peripheral neuropathy)   ? GI/Hepatic ?GERD  ,  ?Endo/Other  ?diabetes, Type 2Hypothyroidism  ? Renal/GU ?Renal disease (nephrolithiasis)  ? ?  ?Musculoskeletal ? 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 Abdominal ?  ?Peds ? Hematology ?negative hematology ROS ?(+)   ?Anesthesia Other Findings ?Reviewed and agree with [facility] Males pre-anesthesia clinical review note.  ? ?Cardiology note 03/13/21:  ?IVORI STORR is a 76 y.o. female with a hx of hypertension, hyperlipidemia, diabetes, COPD, former smoker presenting for preop evaluation. ?? ?Recently seen in the hospital 01/26/2021 due to symptoms of chest pain.   ?Underwent chest CTA 01/23/2021 showing mild LAD calcification disease. ?Echocardiogram 01/24/2021 EF 50 to 55%.  Normal  diastolic function. ?Lexiscan Myoview 01/27/2021 no significant perfusion abnormalities, small inferior apical defect and apical thinning. ?? ?Medical therapy advised.  Aspirin was previously recommended, patient declined due to stomach upset. ?? ?Patient recently admitted for renal stone, s/p stent placement.  Urological procedure/cystoscopy, placement, possible stent removal is being planned.  She denies chest pain or shortness of breath.  Takes lovastatin, cannot tolerate Crestor.  Has a history of GI bleeds and stomach irritation, avoiding aspirin due to this. ? ?1. Preop evaluation prior to urological procedure.  Your procedure is typically deemed low risk from the cardiac perspective.  Last echo with preserved EF, Lexiscan Myoview with no significant ischemia.  Okay to proceed with procedure from a cardiac perspective. ?2. Hyperlipidemia, not tolerant of Crestor.  Increase lovastatin to 40 mg twice daily, start Zetia 10 mg daily.  Check lipid panel in 6 weeks. ?3. LAD calcifications on chest CT.  Lovastatin, Zetia as above.  Avoiding aspirin due to gastric irritation/bleed. ?? ?Follow-up in 2 to 3 months. ? Reproductive/Obstetrics ? ?  ? ? ? ? ? ? ? ? ? ? ? ? ? ?  ?  ? ? ? ? ? ? ? ? ?Anesthesia Physical ? ?Anesthesia Plan ? ?ASA: 3 ? ?Anesthesia Plan: General  ? ?Post-op Pain Management: Minimal or no pain anticipated  ? ?Induction: Intravenous ? ?PONV Risk Score and Plan: 3 and Ondansetron, Dexamethasone and Treatment may vary due to age or medical condition ? ?Airway Management Planned: LMA ? ?Additional Equipment:  ? ?Intra-op Plan:  ? ?Post-operative Plan: Extubation in OR ? ?Informed Consent: I have reviewed the patients History and Physical, chart, labs and discussed the procedure including the risks, benefits and alternatives for the proposed anesthesia with  the patient or authorized representative who has indicated his/her understanding and acceptance.  ? ? ? ?Dental advisory given ? ?Plan Discussed  with: CRNA ? ?Anesthesia Plan Comments: (Patient consented for risks of anesthesia including but not limited to:  ?- adverse reactions to medications ?- damage to eyes, teeth, lips or other oral mucosa ?- nerve damage due to positioning  ?- sore throat or hoarseness ?- damage to heart, brain, nerves, lungs, other parts of body or loss of life ? ?Informed patient about role of CRNA in peri- and intra-operative care.  Patient voiced understanding.)  ? ? ? ? ? ? ?Anesthesia Quick Evaluation ? ?

## 2021-05-16 NOTE — Anesthesia Postprocedure Evaluation (Signed)
Anesthesia Post Note ? ?Patient: Mariah Reed ? ?Procedure(s) Performed: CYSTOSCOPY/URETEROSCOPY/HOLMIUM LASER/STENT REMOVAL VS EXCHANGE (Right) ? ?Patient location during evaluation: PACU ?Anesthesia Type: General ?Level of consciousness: awake and alert ?Pain management: pain level controlled ?Vital Signs Assessment: post-procedure vital signs reviewed and stable ?Respiratory status: spontaneous breathing, nonlabored ventilation, respiratory function stable and patient connected to nasal cannula oxygen ?Cardiovascular status: blood pressure returned to baseline and stable ?Postop Assessment: no apparent nausea or vomiting ?Anesthetic complications: no ? ? ?No notable events documented. ? ? ?Last Vitals:  ?Vitals:  ? 05/16/21 1052 05/16/21 1137  ?BP: (!) 148/64 118/69  ?Pulse: 100 (!) 120  ?Resp: 16 16  ?Temp: 36.8 ?C   ?SpO2: 98% 93%  ?  ?Last Pain:  ?Vitals:  ? 05/16/21 1137  ?TempSrc:   ?PainSc: 2   ? ? ?  ?  ?  ?  ?  ?  ? ?[name] ? ? ? ? ?

## 2021-05-16 NOTE — Discharge Instructions (Addendum)
DISCHARGE INSTRUCTIONS FOR KIDNEY STONE/URETERAL STENT  ? ?MEDICATIONS:  ?1. Resume all your other meds from home.  ?2.  AZO (over-the-counter) can help with the burning/stinging when you urinate. ? ? ?ACTIVITY:  ?1. May resume regular activities in 24 hours. ?2. No driving while on narcotic pain medications  ?3. Drink plenty of water  ?4. Continue to walk at home - you can still get blood clots when you are at home, so keep active, but don't over do it.  ?5. May return to work/school tomorrow or when you feel ready  ? ?BATHING:  ?1. You can shower. ?2. You have a string coming from your urethra: The stent string is attached to your ureteral stent. Do not pull on this.  ? ?SIGNS/SYMPTOMS TO CALL:  ?Common postoperative symptoms include urinary frequency, urgency, bladder spasm and blood in the urine ? ?Please call us if you have a fever greater than 101.5, uncontrolled nausea/vomiting, uncontrolled pain, dizziness, unable to urinate, excessively bloody urine, chest pain, shortness of breath, leg swelling, leg pain, or any other concerns or questions.  ? ?You can reach [place] at [phone].  ? ?FOLLOW-UP:  ?1. You have a follow-up appointment scheduled 06/17/2021 ?2. You have a string attached to your stent, you may remove it on Thursday, 05/18/2021. To do this, pull the string until the stent is completely removed. You may feel an odd sensation in your back.  If you have any questions regarding stent removal please call the office at [phone] ?AMBULATORY SURGERY  ?DISCHARGE INSTRUCTIONS ? ? ?The drugs that you were given will stay in your system until tomorrow so for the next 24 hours you should not: ? ?Drive an automobile ?Make any legal decisions ?Drink any alcoholic beverage ? ? ?You may resume regular meals tomorrow.  Today it is better to start with liquids and gradually work up to solid foods. ? ?You may eat anything you prefer, but it is better to start with liquids, then soup and crackers, and gradually work  up to solid foods. ? ? ?Please notify your doctor immediately if you have any unusual bleeding, trouble breathing, redness and pain at the surgery site, drainage, fever, or pain not relieved by medication. ? ?  ? ?Additional Instructions:  ? ? ? ? ? ? ? ? ? ? ? ?

## 2021-05-16 NOTE — Op Note (Signed)
Preoperative diagnosis: Right nephrolithiasis  ? ?Postoperative diagnosis:  ?Right nephrolithiasis ?Right ureteral calculi ? ?Procedure: ? ?Cystoscopy ?Right ureteroscopy and stone removal ?Ureteroscopic laser lithotripsy ?Right ureteral stent exchange (45F/24 cm)  ?Right retrograde pyelography with interpretation ? ?Surgeon: Ronda Fairly. Roxann Vierra, M.D. ? ?Anesthesia: General ? ?Complications: None ? ?Intraoperative findings:  ?Cystoscopy-inflammatory changes right hemitrigone secondary to indwelling stent ?Ureteroscopy-distal ureteral stone fragments measuring ~ 4 mm ?Pyeloscopy-right lower calyceal fragments measuring ~ 5 mm.  Removed with basket.  Small calyceal fragments dusted with holmium laser fiber ?Right retrograde pyelography post procedure showed no filling defects, stone fragments or contrast extravasation.  Moderate hydronephrosis and hydroureter without obstruction ? ?EBL: Minimal ? ?Specimens: ?None ? ? ?Indication: Mariah Reed is a 76 y.o. who underwent right ureteroscopy with laser lithotripsy of a right partial lower pole staghorn calculus 03/14/2021.  She was scheduled for a second look ureteroscopy in early March however has been rescheduled on several occasions and presents today for second look ureteroscopy. After reviewing the management options for treatment, the patient elected to proceed with the above surgical procedure(s). We have discussed the potential benefits and risks of the procedure, side effects of the proposed treatment, the likelihood of the patient achieving the goals of the procedure, and any potential problems that might occur during the procedure or recuperation. Informed consent has been obtained. ? ?Description of procedure: ? ?The patient was taken to the operating room and general anesthesia was induced.  The patient was placed in the dorsal lithotomy position, prepped and draped in the usual sterile fashion, and preoperative antibiotics were administered. A preoperative  time-out was performed.  ? ?A 21 French cystoscope was lubricated and passed under per urethra.  Cystoscopy was performed with findings as described above ? ?The right ureteral stent was grasped with endoscopic forceps and brought out to the urethral meatus.  The stent could not be cannulated with a guidewire secondary to encrustation.  The cystoscope was repassed and a 0.038 Sensor wire was placed through the cystoscope into the right ureter alongside the stent and passed to the renal pelvis on fluoroscopic guidance.  The stent was then removed. ? ?A single channel digital flexible ureteroscope was passed per urethra and easily advanced into the right ureter alongside the guidewire.  The ureter was dilated and easily advanced into the renal pelvis.  The distal ureteral fragments were removed via a 1.9 [place] nitinol basket.  The larger lower calyceal fragment was removed with a 1.9 [place] nitinol basket. ? ?A 243 ?m holmium laser fiber was placed through the ureteroscope and the lower calyceal fragments were further fragmented at a setting of 0.6J/40 Hz until there were no fragments larger than the holmium laser fiber tip. ? ?Right retrograde pyelogram was performed with findings as described above.  All calyces were examined.  There were multiple Randall's plaques present on renal papillae.  No additional stone fragments were identified. ? ?A 45F/24 cm Contour ureteral stent with tether was placed under fluoroscopic guidance.  The proximal curl was within an upper pole calyx and the distal stent end was well positioned in the bladder. ? ?The bladder was then emptied and the procedure ended.  The patient appeared to tolerate the procedure well and without complications.  After anesthetic reversal the patient was transported to the PACU in stable condition.  ? ?Plan: ?She was instructed to remove the stent via the tether on 05/18/2021 ?Postop follow-up 1 month to discuss stone analysis and metabolic  evaluation ? ? ?Karine Garn  Mikaylee Arseneau, MD ? ?

## 2021-05-16 NOTE — Anesthesia Procedure Notes (Signed)
Procedure Name: LMA Insertion ?Date/Time: 05/16/2021 8:40 AM ?Performed by: Esaw Grandchild, CRNA ?Pre-anesthesia Checklist: Patient identified, Emergency Drugs available, Suction available and Patient being monitored ?Patient Re-evaluated:Patient Re-evaluated prior to induction ?Oxygen Delivery Method: Circle system utilized ?Preoxygenation: Pre-oxygenation with 100% oxygen ?Induction Type: IV induction ?LMA: LMA inserted ?LMA Size: 4.0 ?Number of attempts: 1 ?Placement Confirmation: positive ETCO2 and breath sounds checked- equal and bilateral ?Tube secured with: Tape ?Dental Injury: Teeth and Oropharynx as per pre-operative assessment  ? ? ? ? ?

## 2021-05-17 ENCOUNTER — Telehealth: Payer: Self-pay | Admitting: Urology

## 2021-05-17 NOTE — Telephone Encounter (Signed)
Patient had surgery with Dr. Bernardo Heater on 05/16/21 and called the office today that she is doing well but running a temperature (100.3). ? ?Per Dr. Bernardo Heater, patient should continue to monitor her temperature and go to the emergency room if over 101. ? ?Patient expressed understanding. ?

## 2021-05-19 ENCOUNTER — Ambulatory Visit: Payer: Medicare Other | Admitting: Urology

## 2021-05-23 DIAGNOSIS — R059 Cough, unspecified: Secondary | ICD-10-CM | POA: Diagnosis not present

## 2021-05-23 DIAGNOSIS — M5440 Lumbago with sciatica, unspecified side: Secondary | ICD-10-CM | POA: Diagnosis not present

## 2021-05-23 DIAGNOSIS — R053 Chronic cough: Secondary | ICD-10-CM | POA: Diagnosis not present

## 2021-05-23 DIAGNOSIS — Z79899 Other long term (current) drug therapy: Secondary | ICD-10-CM | POA: Diagnosis not present

## 2021-05-23 DIAGNOSIS — M25562 Pain in left knee: Secondary | ICD-10-CM | POA: Diagnosis not present

## 2021-05-23 DIAGNOSIS — E039 Hypothyroidism, unspecified: Secondary | ICD-10-CM | POA: Diagnosis not present

## 2021-05-23 DIAGNOSIS — G8929 Other chronic pain: Secondary | ICD-10-CM | POA: Diagnosis not present

## 2021-05-23 DIAGNOSIS — E118 Type 2 diabetes mellitus with unspecified complications: Secondary | ICD-10-CM | POA: Diagnosis not present

## 2021-05-23 DIAGNOSIS — Z780 Asymptomatic menopausal state: Secondary | ICD-10-CM | POA: Diagnosis not present

## 2021-05-23 DIAGNOSIS — E1142 Type 2 diabetes mellitus with diabetic polyneuropathy: Secondary | ICD-10-CM | POA: Diagnosis not present

## 2021-05-23 DIAGNOSIS — G629 Polyneuropathy, unspecified: Secondary | ICD-10-CM | POA: Diagnosis not present

## 2021-05-23 DIAGNOSIS — I10 Essential (primary) hypertension: Secondary | ICD-10-CM | POA: Diagnosis not present

## 2021-05-23 DIAGNOSIS — N200 Calculus of kidney: Secondary | ICD-10-CM | POA: Diagnosis not present

## 2021-05-23 DIAGNOSIS — Z0000 Encounter for general adult medical examination without abnormal findings: Secondary | ICD-10-CM | POA: Diagnosis not present

## 2021-05-30 ENCOUNTER — Other Ambulatory Visit: Payer: Self-pay

## 2021-05-30 MED ORDER — EZETIMIBE 10 MG PO TABS: 10.0000 mg | ORAL_TABLET | Freq: Every day | ORAL | 0 refills | Status: DC

## 2021-06-05 ENCOUNTER — Other Ambulatory Visit: Payer: Self-pay | Admitting: *Deleted

## 2021-06-05 ENCOUNTER — Other Ambulatory Visit: Payer: Self-pay | Admitting: Urology

## 2021-06-05 MED ORDER — LOVASTATIN 40 MG PO TABS: 40.0000 mg | ORAL_TABLET | Freq: Two times a day (BID) | ORAL | 0 refills | Status: DC

## 2021-06-15 ENCOUNTER — Ambulatory Visit (INDEPENDENT_AMBULATORY_CARE_PROVIDER_SITE_OTHER): Payer: Medicare Other | Admitting: Urology

## 2021-06-15 ENCOUNTER — Encounter: Payer: Self-pay | Admitting: Urology

## 2021-06-15 VITALS — BP 131/76 | HR 90 | Ht 65.0 in | Wt 188.0 lb

## 2021-06-15 DIAGNOSIS — Z87442 Personal history of urinary calculi: Secondary | ICD-10-CM | POA: Diagnosis not present

## 2021-06-15 MED ORDER — FLUCONAZOLE 150 MG PO TABS
150.0000 mg | ORAL_TABLET | Freq: Once | ORAL | 0 refills | Status: AC
Start: 2021-06-15 — End: 2021-06-15

## 2021-06-15 NOTE — Patient Instructions (Signed)
[facility] Instructions ?[facility] ?  ?You will receive a box/kit in the mail that will have a urine jug and instructions in the kit.  When the box arrives you will need to call our office [phone] to schedule a LAB appointment. ?  ?You will need to do a 24hour urine and this should be done during the days that our office will be open.  For example any day from Sunday through Thursday. ?  ?If you take Vitamin C 186m or greater please stop this 5 days prior to collection. ?  ?How to collect the urine sample: On the day you start the urine sample this 1st morning urine should NOT be collected.  For the rest of the day including all night urines should be collected.  On the next morning the 1st urine should be collected and then you will be finished with the urine collections. ?  ?You will need to bring the box with you on your LAB appointment day after urine has been collected and all instructions are complete in the box.  Your blood will be drawn and the box will be collected by our Lab employee to be sent off for analysis. ?  ?When urine and blood is complete you will need to schedule a follow up appointment for lab results.  ?

## 2021-06-15 NOTE — Progress Notes (Signed)
? ?06/15/2021 ?1:03 PM  ? ?[place] ?Dec 30, 1945 ?[id number] ? ?Referring provider: Gladstone Lighter, MD ?[address] ?[place],  [place] [address] ? ?Chief Complaint  ?Patient presents with  ? Nephrolithiasis  ? ? ?HPI: ?76 y.o. female presents for postop follow-up. ? ?Completion of staged ureteroscopy for a partial right lower pole staghorn calculus 05/16/2021.  Refer to op note for details ?She removed her stent via tether 2 days postop ?She recently completed a course of antibiotics for a pulmonary infection and thinks she has a yeast infection with complaints of vaginal itching ?Stone analysis 30% carbonate apatite/70% struvite ?Denies flank, abdominal pain ?No bothersome LUTS ? ?PMH: ?Past Medical History:  ?Diagnosis Date  ? Allergy   ? Anemia   ? Anginal pain ([place])   ? Arthritis   ? CHF (congestive heart failure) ([place])   ? COPD (chronic obstructive pulmonary disease) ([place])   ? Diabetes mellitus   ? GERD (gastroesophageal reflux disease)   ? Gout   ? Headache   ? History of kidney stones   ? Hyperlipidemia   ? Hypertension   ? Hypothyroidism   ? LBBB (left bundle branch block)   ? Low back pain   ? Myocardial infarction [facility])   ? Nephrolithiasis   ? Peripheral neuropathy   ? Pneumonia   ? Rhabdomyolysis   ? a.) noted during 12/16-12/25/2022 admission for sepsis 2/2 RIGHT ureteral and RIGHT staghorn nephrolithiasis.  ? Sepsis ([place]) 01/20/2021  ? a.) sepsis 2/2 RIGHT ureteral and RIGHT staghorn nephrolithiasis.  ? ? ?Surgical History: ?Past Surgical History:  ?Procedure Laterality Date  ? ABDOMINAL HYSTERECTOMY    ? CHOLECYSTECTOMY  unknown  ? Patient stated she does not remember when it was removed. (possibly 1997)  ? COLONOSCOPY    ? CYSTOSCOPY WITH RETROGRADE PYELOGRAM, URETEROSCOPY AND STENT PLACEMENT Right 01/20/2021  ? Procedure: CYSTOSCOPY WITH RETROGRADE PYELOGRAM, URETEROSCOPY AND STENT PLACEMENT;  Surgeon: Janith Lima, MD;  Location: [facility];  Service: Urology;  Laterality: Right;  ?  CYSTOSCOPY/URETEROSCOPY/HOLMIUM LASER/STENT PLACEMENT Right 03/14/2021  ? Procedure: CYSTOSCOPY/URETEROSCOPY/HOLMIUM LASER/STENT PLACEMENT;  Surgeon: Abbie Sons, MD;  Location: [facility] OR[facility];  Service: Urology;  Laterality: Right;  ? CYSTOSCOPY/URETEROSCOPY/HOLMIUM LASER/STENT PLACEMENT Right 05/16/2021  ? Procedure: CYSTOSCOPY/URETEROSCOPY/HOLMIUM LASER/STENT REMOVAL VS EXCHANGE;  Surgeon: Abbie Sons, MD;  Location: [facility] ORS;  Service: Urology;  Laterality: Right;  ? POLYPECTOMY    ? Colon  ? TUBAL LIGATION    ? ? ?Home Medications:  ?Allergies as of 06/15/2021   ? ?   Reactions  ? Levofloxacin Other (See Comments)  ? Made head feel weird  ? Nortriptyline Other (See Comments)  ? Caused rectal bleeding  ? Tape Other (See Comments)  ? Breaks out with Medical Tape   ? ?  ? ?  ?Medication List  ?  ? ?  ? Accurate as of Jun 15, 2021  1:03 PM. If you have any questions, ask your nurse or doctor.  ?  ?  ? ?  ? ?Accu-Chek FastClix Lancets Misc ?1 Device by Does not apply route 3 (three) times daily. Use to check blood sugar up to three times a day ?  ?acetaminophen 325 MG tablet ?Commonly known as: TYLENOL ?Take 2 tablets (650 mg total) by mouth every 6 (six) hours as needed for mild pain (or Fever >/= 101). ?  ?albuterol 108 (90 Base) MCG/ACT inhaler ?Commonly known as: VENTOLIN HFA ?INHALE 2 PUFFS INTO THE LUNGS EVERY 4 HOURS AS NEEDED ?  ?  alum & mag hydroxide-simeth 200-200-20 MG/5ML suspension ?Commonly known as: MAALOX/MYLANTA ?Take 30 mLs by mouth 2 (two) times daily. ?  ?B-12 PO ?Take 1 tablet by mouth daily. ?  ?benzonatate 100 MG capsule ?Commonly known as: TESSALON ?Take 1 capsule (100 mg total) by mouth 3 (three) times daily as needed for cough. ?  ?chlorpheniramine 4 MG tablet ?Commonly known as: CHLOR-TRIMETON ?Take 4 mg by mouth 2 (two) times daily as needed for allergies. ?  ?diclofenac 1.3 % Ptch ?Commonly known as: Flector ?Place 1 patch onto the skin 2 (two) times daily. ?  ?diclofenac sodium 1 %  Gel ?Commonly known as: VOLTAREN ?APPLY 2 GRAMS TOPICALLY 4 TIMES DAILY ?What changed: See the new instructions. ?  ?diphenoxylate-atropine 2.5-0.025 MG tablet ?Commonly known as: LOMOTIL ?Take 2 tablets by mouth in the morning, at noon, and at bedtime. Take with imodium ?  ?empagliflozin 25 MG Tabs tablet ?Commonly known as: JARDIANCE ?Take 25 mg by mouth daily after lunch. ?  ?esomeprazole 40 MG capsule ?Commonly known as: [place] ?TAKE 1 CAPSULE BY MOUTH ONCE DAILY ?  ?ezetimibe 10 MG tablet ?Commonly known as: ZETIA ?Take 1 tablet (10 mg total) by mouth daily. ?  ?FEXOFENADINE HCL PO ?Take 0.5 tablets by mouth daily. ?  ?fluconazole 100 MG tablet ?Commonly known as: DIFLUCAN ?Take 1 tablet (100 mg total) by mouth daily. X 7 days ?  ?fluticasone 50 MCG/ACT nasal spray ?Commonly known as: FLONASE ?Place 2 sprays into both nostrils daily. ?  ?glipiZIDE 10 MG tablet ?Commonly known as: GLUCOTROL ?Take 1 tablet (10 mg total) by mouth 2 (two) times daily before a meal. ?  ?guaiFENesin 600 MG 12 hr tablet ?Commonly known as: [place] ?Take by mouth at bedtime. ?  ?guaiFENesin-dextromethorphan 100-10 MG/5ML syrup ?Commonly known as: ROBITUSSIN DM ?Take 5 mLs by mouth every 4 (four) hours as needed for cough. ?  ?hydrOXYzine 50 MG tablet ?Commonly known as: ATARAX ?Take 1 tablet (50 mg total) by mouth 3 (three) times daily as needed. ?  ?levothyroxine 75 MCG tablet ?Commonly known as: SYNTHROID ?Take 1 tablet (75 mcg total) by mouth daily. ?  ?lipase/protease/amylase 36000 UNITS Cpep capsule ?Commonly known as: CREON ?Take 36,000-72,000 Units by mouth See admin instructions. Take 2 capsules (72000 units) by mouth three times daily with meals, take 1 capsule (36000 units) with snacks or drinks ?  ?loperamide 2 MG capsule ?Commonly known as: IMODIUM ?Take 8 mg by mouth 2 (two) times daily. Take with lomotil ?  ?losartan 50 MG tablet ?Commonly known as: COZAAR ?Take 50 mg by mouth daily. ?  ?lovastatin 40 MG tablet ?Commonly  known as: MEVACOR ?Take 1 tablet (40 mg total) by mouth 2 (two) times daily. ?  ?meclizine 25 MG tablet ?Commonly known as: ANTIVERT ?Take 25 mg by mouth 3 (three) times daily as needed for dizziness. ?  ?menthol-cetylpyridinium 3 MG lozenge ?Commonly known as: CEPACOL ?Take 1 lozenge (3 mg total) by mouth as needed for sore throat. ?  ?metoprolol tartrate 25 MG tablet ?Commonly known as: LOPRESSOR ?Take 1 tablet (25 mg total) by mouth 2 (two) times daily. ?  ?NIFEdipine 30 MG 24 hr tablet ?Commonly known as: ADALAT CC ?Take 1 tablet (30 mg total) by mouth daily. ?  ?nitroGLYCERIN 0.4 MG SL tablet ?Commonly known as: NITROSTAT ?Take SL prn chest pain/May repeat 5 min apart up to 3 times prn ?  ?nystatin cream ?Commonly known as: MYCOSTATIN ?APPLY TO AFFECTED AREAS ONCE DAILY AS NEEDED ?  ?  ondansetron 4 MG tablet ?Commonly known as: ZOFRAN ?Take 1 tablet (4 mg total) by mouth every 6 (six) hours as needed for nausea. ?  ?oxybutynin 5 MG tablet ?Commonly known as: DITROPAN ?Take 1 tablet (5 mg total) by mouth 3 (three) times daily as needed for bladder spasms. ?  ?oxyCODONE 20 mg 12 hr tablet ?Commonly known as: OxyCONTIN ?Take 1 tablet (20 mg total) by mouth every 12 (twelve) hours. ?  ?Pataday 0.2 % Soln ?Generic drug: Olopatadine HCl ?PLACE 1 DROP INTO BOTH EYES EVERY DAY ?  ?pregabalin 25 MG capsule ?Commonly known as: LYRICA ?Take 1 capsule (25 mg total) by mouth 2 (two) times daily. ?  ?Spiriva HandiHaler 18 MCG inhalation capsule ?Generic drug: tiotropium ?INHALE THE CONTENTS OF 1 CAPSUOLE VIA HANDIHALER ONCE DAILY ?  ?sulfamethoxazole-trimethoprim 800-160 MG tablet ?Commonly known as: BACTRIM DS ?Take 1 tablet by mouth every 12 (twelve) hours. ?  ?tiZANidine 4 MG tablet ?Commonly known as: ZANAFLEX ?Take 4 mg by mouth See admin instructions. Take one tablet (4 mg) by mouth twice daily - at bedtime and 1am ?  ?topiramate 100 MG tablet ?Commonly known as: TOPAMAX ?Take 3 tablets (300 mg total) by mouth daily. ?   ?zolpidem 5 MG tablet ?Commonly known as: AMBIEN ?Take 1 tablet (5 mg total) by mouth at bedtime as needed. for sleep ?  ? ?  ? ? ?Allergies:  ?Allergies  ?Allergen Reactions  ? Levofloxacin Other (See Commen

## 2021-06-19 ENCOUNTER — Ambulatory Visit (INDEPENDENT_AMBULATORY_CARE_PROVIDER_SITE_OTHER): Payer: Medicare Other | Admitting: Cardiology

## 2021-06-19 ENCOUNTER — Encounter: Payer: Self-pay | Admitting: Cardiology

## 2021-06-19 VITALS — BP 110/70 | HR 80 | Ht 65.0 in | Wt 192.6 lb

## 2021-06-19 DIAGNOSIS — I2510 Atherosclerotic heart disease of native coronary artery without angina pectoris: Secondary | ICD-10-CM | POA: Diagnosis not present

## 2021-06-19 DIAGNOSIS — E782 Mixed hyperlipidemia: Secondary | ICD-10-CM | POA: Diagnosis not present

## 2021-06-19 NOTE — Patient Instructions (Signed)

## 2021-06-19 NOTE — Progress Notes (Signed)
?Cardiology Office Note:   ? ?Date:  06/19/2021  ? ?ID:  Mariah Reed, DOB [birth year], MRN [id number] ? ?PCP:  Gladstone Lighter, MD ?  ?[facility] Providers ?Cardiologist:  Kate Sable, MD    ? ?Referring MD: Gladstone Lighter, MD  ? ?Chief Complaint  ?Patient presents with  ? Other  ?  2 month follow up. Meds reviewed verbally with patient ?  ? ? ?History of Present Illness:   ? ?Mariah Reed is a 76 y.o. female with a hx of hypertension, hyperlipidemia, diabetes, scoliosis, COPD, former smoker presenting for follow-up. ? ?Previously seen for preop eval prior to urologic procedure.  History of coronary artery calcifications.  Triglycerides were elevated, patient did not tolerate Crestor.  Lovastatin was increased to 80 mg daily, Zetia was started.  Patient did not take Zetia stating she did not want to take any additional medications.  Has no new concerns at this time. ? ?Prior studies/notes  ?seen in the hospital 01/26/2021 due to symptoms of chest pain.   ?Underwent chest CTA 01/23/2021 showing mild LAD calcification disease. ?Echocardiogram 01/24/2021 EF 50 to 55%.  Normal diastolic function. ?Lexiscan Myoview 01/27/2021 no significant perfusion abnormalities, small inferior apical defect and apical thinning. ?Medical therapy advised.  Aspirin was previously recommended, patient declined due to stomach upset history of GI bleeds. ? ?Patient recently admitted for renal stone, s/p stent placement.  Urological procedure/cystoscopy, placement, possible stent removal is being planned.  She denies chest pain or shortness of breath.  Takes lovastatin, cannot tolerate Crestor.  Has a history of GI bleeds and stomach irritation, avoiding aspirin due to this. ? ?Past Medical History:  ?Diagnosis Date  ? Allergy   ? Anemia   ? Anginal pain ([place])   ? Arthritis   ? CHF (congestive heart failure) ([place])   ? COPD (chronic obstructive pulmonary disease) ([place])   ? Diabetes mellitus   ? GERD (gastroesophageal  reflux disease)   ? Gout   ? Headache   ? History of kidney stones   ? Hyperlipidemia   ? Hypertension   ? Hypothyroidism   ? LBBB (left bundle branch block)   ? Low back pain   ? Myocardial infarction [facility])   ? Nephrolithiasis   ? Peripheral neuropathy   ? Pneumonia   ? Rhabdomyolysis   ? a.) noted during 12/16-12/25/2022 admission for sepsis 2/2 RIGHT ureteral and RIGHT staghorn nephrolithiasis.  ? Sepsis ([place]) 01/20/2021  ? a.) sepsis 2/2 RIGHT ureteral and RIGHT staghorn nephrolithiasis.  ? ? ?Past Surgical History:  ?Procedure Laterality Date  ? ABDOMINAL HYSTERECTOMY    ? CHOLECYSTECTOMY  unknown  ? Patient stated she does not remember when it was removed. (possibly 1997)  ? COLONOSCOPY    ? CYSTOSCOPY WITH RETROGRADE PYELOGRAM, URETEROSCOPY AND STENT PLACEMENT Right 01/20/2021  ? Procedure: CYSTOSCOPY WITH RETROGRADE PYELOGRAM, URETEROSCOPY AND STENT PLACEMENT;  Surgeon: Janith Lima, MD;  Location: [facility];  Service: Urology;  Laterality: Right;  ? CYSTOSCOPY/URETEROSCOPY/HOLMIUM LASER/STENT PLACEMENT Right 03/14/2021  ? Procedure: CYSTOSCOPY/URETEROSCOPY/HOLMIUM LASER/STENT PLACEMENT;  Surgeon: Abbie Sons, MD;  Location: [facility];  Service: Urology;  Laterality: Right;  ? CYSTOSCOPY/URETEROSCOPY/HOLMIUM LASER/STENT PLACEMENT Right 05/16/2021  ? Procedure: CYSTOSCOPY/URETEROSCOPY/HOLMIUM LASER/STENT REMOVAL VS EXCHANGE;  Surgeon: Abbie Sons, MD;  Location: [facility] OR[facility];  Service: Urology;  Laterality: Right;  ? POLYPECTOMY    ? Colon  ? TUBAL LIGATION    ? ? ?Current Medications: ?Current Meds  ?Medication Sig  ? diclofenac sodium (VOLTAREN) 1 %  GEL APPLY 2 GRAMS TOPICALLY 4 TIMES DAILY (Patient taking differently: as needed.)  ? diphenoxylate-atropine (LOMOTIL) 2.5-0.025 MG tablet Take 2 tablets by mouth in the morning, at noon, and at bedtime. Take with imodium  ? empagliflozin (JARDIANCE) 25 MG TABS tablet Take 25 mg by mouth daily after lunch.  ? esomeprazole (NEXIUM) 40 MG capsule TAKE 1 CAPSULE  BY MOUTH ONCE DAILY  ? fluticasone (FLONASE) 50 MCG/ACT nasal spray Place 2 sprays into both nostrils daily.  ? glipiZIDE (GLUCOTROL) 10 MG tablet Take 1 tablet (10 mg total) by mouth 2 (two) times daily before a meal.  ? guaiFENesin (MUCINEX) 600 MG 12 hr tablet Take by mouth at bedtime.  ? hydrOXYzine (ATARAX/VISTARIL) 50 MG tablet Take 1 tablet (50 mg total) by mouth 3 (three) times daily as needed.  ? levothyroxine (SYNTHROID) 75 MCG tablet Take 1 tablet (75 mcg total) by mouth daily.  ? loperamide (IMODIUM) 2 MG capsule Take 8 mg by mouth 2 (two) times daily. Take with lomotil  ? lovastatin (MEVACOR) 40 MG tablet Take 1 tablet (40 mg total) by mouth 2 (two) times daily.  ? metoprolol tartrate (LOPRESSOR) 25 MG tablet Take 1 tablet (25 mg total) by mouth 2 (two) times daily.  ? NIFEdipine (ADALAT CC) 30 MG 24 hr tablet Take 1 tablet (30 mg total) by mouth daily.  ? nitroGLYCERIN (NITROSTAT) 0.4 MG SL tablet Take SL prn chest pain/May repeat 5 min apart up to 3 times prn  ? nystatin cream (MYCOSTATIN) APPLY TO AFFECTED AREAS ONCE DAILY AS NEEDED  ? oxyCODONE (OXYCONTIN) 20 mg 12 hr tablet Take 1 tablet (20 mg total) by mouth every 12 (twelve) hours.  ? PATADAY 0.2 % SOLN PLACE 1 DROP INTO BOTH EYES EVERY DAY  ? pregabalin (LYRICA) 25 MG capsule Take 1 capsule (25 mg total) by mouth 2 (two) times daily.  ? tiotropium (SPIRIVA HANDIHALER) 18 MCG inhalation capsule INHALE THE CONTENTS OF 1 CAPSUOLE VIA HANDIHALER ONCE DAILY  ? tiZANidine (ZANAFLEX) 4 MG tablet Take 4 mg by mouth See admin instructions. Take one tablet (4 mg) by mouth twice daily - at bedtime and 1am  ? topiramate (TOPAMAX) 100 MG tablet Take 3 tablets (300 mg total) by mouth daily.  ? zolpidem (AMBIEN) 5 MG tablet Take 1 tablet (5 mg total) by mouth at bedtime as needed. for sleep  ?  ? ?Allergies:   Levofloxacin, Nortriptyline, and Tape  ? ?Social History  ? ?Socioeconomic History  ? Marital status: Legally Separated  ?  Spouse name: Malayla Granberry   ? Number of children: Not on file  ? Years of education: Not on file  ? Highest education level: Not on file  ?Occupational History  ?  Employer: DISABLED  ?Tobacco Use  ? Smoking status: Former  ?  Packs/day: 2.50  ?  Years: 10.00  ?  Pack years: 25.00  ?  Types: Cigarettes  ? Smokeless tobacco: Never  ?Substance and Sexual Activity  ? Alcohol use: No  ? Drug use: No  ? Sexual activity: Not on file  ?Other Topics Concern  ? Not on file  ?Social History Narrative  ? Pt would like [name] at [phone] (nephew) to be called in case of emergency and ask him to call her daughter [name] at [phone].  ? ?Social Determinants of Health  ? ?Financial Resource Strain: Not on file  ?Food Insecurity: Not on file  ?Transportation Needs: Not on file  ?Physical Activity: Not on  file  ?Stress: Not on file  ?Social Connections: Not on file  ?  ? ?Family History: ?The patient's family history includes Uterine cancer in her mother. ? ?ROS:   ?Please see the history of present illness.    ? All other systems reviewed and are negative. ? ?EKGs/Labs/Other Studies Reviewed:   ? ?The following studies were reviewed today: ? ? ?EKG:  EKG not  ordered today.  ? ?Recent Labs: ?01/26/2021: TSH 5.557 ?01/29/2021: ALT 22; BUN 13; Creatinine, Ser 0.82; Hemoglobin 13.9; Magnesium 2.2; Platelets 206; Potassium 3.6; Sodium 139  ?Recent Lipid Panel ?   ?Component Value Date/Time  ? CHOL 168 01/26/2021 0546  ? CHOL 170 10/27/2019 1009  ? TRIG 186 (H) 01/26/2021 0546  ? HDL 22 (L) 01/26/2021 0546  ? HDL 45 10/27/2019 1009  ? CHOLHDL 7.6 01/26/2021 0546  ? VLDL 37 01/26/2021 0546  ? [place] 109 (H) 01/26/2021 0546  ? [place] 95 10/27/2019 1009  ? LDLCALC 113 (H) 02/10/2018 1123  ? LDLDIRECT 71.0 03/17/2019 1135  ? ? ? ?Risk Assessment/Calculations:   ? ? ?    ? ?Physical Exam:   ? ?VS:  BP 110/70 (BP Location: Left Arm, Patient Position: Sitting, Cuff Size: Normal)   Pulse 80   Ht '5\' 5"'$  (1.651 m)   Wt 192 lb 9.6 oz (87.4  kg)   SpO2 97%   BMI 32.05 kg/m?    ? ?Wt Readings from Last 3 Encounters:  ?06/19/21 192 lb 9.6 oz (87.4 kg)  ?06/15/21 188 lb (85.3 kg)  ?05/16/21 184 lb (83.5 kg)  ?  ? ?GEN:  Well nourished, well developed

## 2021-06-27 ENCOUNTER — Other Ambulatory Visit: Payer: Medicare Other

## 2021-06-27 DIAGNOSIS — N200 Calculus of kidney: Secondary | ICD-10-CM | POA: Diagnosis not present

## 2021-07-06 ENCOUNTER — Other Ambulatory Visit: Payer: Self-pay

## 2021-07-06 MED ORDER — EZETIMIBE 10 MG PO TABS: 10.0000 mg | ORAL_TABLET | Freq: Every day | ORAL | 3 refills | Status: DC

## 2021-07-06 MED ORDER — LOVASTATIN 40 MG PO TABS: 40.0000 mg | ORAL_TABLET | Freq: Two times a day (BID) | ORAL | 2 refills | Status: DC

## 2021-07-11 ENCOUNTER — Telehealth: Payer: Self-pay

## 2021-07-11 DIAGNOSIS — N200 Calculus of kidney: Secondary | ICD-10-CM

## 2021-07-11 NOTE — Telephone Encounter (Signed)
Patient called and left a message requesting 24 HR urine results. Results never received from [facility], able to print from online portal, sent to be scanned into chart. Patient was notified of this and was told we would call with results once the report was reviewed.

## 2021-07-13 NOTE — Telephone Encounter (Signed)
Urine output was excellent at >3.5 L.  The only significant abnormalities identified were a low urine citrate level and an elevated urine pH.  It is possible she may have a condition called renal tubular acidosis which can predispose to stone formation.  I would recommend a nephrology evaluation to evaluate for this condition.  I put in the referral and she will be contacted for an appointment.  Please let me know if she has any questions.

## 2021-07-14 ENCOUNTER — Other Ambulatory Visit: Payer: Self-pay | Admitting: Urology

## 2021-07-14 NOTE — Telephone Encounter (Signed)
Notified patient as instructed,she will wait for the referral

## 2021-07-21 ENCOUNTER — Other Ambulatory Visit: Payer: Self-pay | Admitting: Urology

## 2021-07-21 ENCOUNTER — Other Ambulatory Visit: Payer: Self-pay | Admitting: Internal Medicine

## 2021-07-21 ENCOUNTER — Ambulatory Visit
Admission: RE | Admit: 2021-07-21 | Discharge: 2021-07-21 | Disposition: A | Discharge status: Home / Self Care | Payer: Medicare Other | Source: Ambulatory Visit | Attending: Internal Medicine | Admitting: Internal Medicine

## 2021-07-21 DIAGNOSIS — R600 Localized edema: Secondary | ICD-10-CM

## 2021-07-21 DIAGNOSIS — G8929 Other chronic pain: Secondary | ICD-10-CM | POA: Diagnosis not present

## 2021-07-21 DIAGNOSIS — M5440 Lumbago with sciatica, unspecified side: Secondary | ICD-10-CM | POA: Diagnosis not present

## 2021-07-21 DIAGNOSIS — M545 Low back pain, unspecified: Secondary | ICD-10-CM | POA: Diagnosis not present

## 2021-07-26 ENCOUNTER — Other Ambulatory Visit: Payer: Self-pay | Admitting: Internal Medicine

## 2021-07-26 DIAGNOSIS — G8929 Other chronic pain: Secondary | ICD-10-CM

## 2021-07-28 DIAGNOSIS — R0789 Other chest pain: Secondary | ICD-10-CM | POA: Diagnosis not present

## 2021-07-28 DIAGNOSIS — R079 Chest pain, unspecified: Secondary | ICD-10-CM | POA: Diagnosis not present

## 2021-07-28 DIAGNOSIS — J449 Chronic obstructive pulmonary disease, unspecified: Secondary | ICD-10-CM | POA: Diagnosis not present

## 2021-07-28 DIAGNOSIS — Z87891 Personal history of nicotine dependence: Secondary | ICD-10-CM | POA: Diagnosis not present

## 2021-08-22 DIAGNOSIS — R195 Other fecal abnormalities: Secondary | ICD-10-CM | POA: Diagnosis not present

## 2021-08-22 DIAGNOSIS — K5289 Other specified noninfective gastroenteritis and colitis: Secondary | ICD-10-CM | POA: Diagnosis not present

## 2021-08-22 DIAGNOSIS — Z9049 Acquired absence of other specified parts of digestive tract: Secondary | ICD-10-CM | POA: Diagnosis not present

## 2021-08-23 ENCOUNTER — Emergency Department (HOSPITAL_COMMUNITY)
Admission: EM | Admit: 2021-08-23 | Discharge: 2021-08-24 | Disposition: A | Discharge status: Home / Self Care | Payer: Medicare Other | Attending: Emergency Medicine | Admitting: Emergency Medicine

## 2021-08-23 ENCOUNTER — Encounter (HOSPITAL_COMMUNITY): Payer: Self-pay | Admitting: Emergency Medicine

## 2021-08-23 ENCOUNTER — Emergency Department (HOSPITAL_COMMUNITY): Payer: Medicare Other

## 2021-08-23 ENCOUNTER — Ambulatory Visit: Admission: RE | Admit: 2021-08-23 | Payer: Medicare Other | Source: Ambulatory Visit

## 2021-08-23 DIAGNOSIS — I10 Essential (primary) hypertension: Secondary | ICD-10-CM | POA: Diagnosis not present

## 2021-08-23 DIAGNOSIS — R079 Chest pain, unspecified: Secondary | ICD-10-CM | POA: Insufficient documentation

## 2021-08-23 DIAGNOSIS — Z5321 Procedure and treatment not carried out due to patient leaving prior to being seen by health care provider: Secondary | ICD-10-CM | POA: Diagnosis not present

## 2021-08-23 DIAGNOSIS — R739 Hyperglycemia, unspecified: Secondary | ICD-10-CM | POA: Diagnosis not present

## 2021-08-23 DIAGNOSIS — R0789 Other chest pain: Secondary | ICD-10-CM | POA: Diagnosis not present

## 2021-08-23 DIAGNOSIS — I959 Hypotension, unspecified: Secondary | ICD-10-CM | POA: Diagnosis not present

## 2021-08-23 LAB — CBC
HCT: 45.1 % (ref 36.0–46.0)
Hemoglobin: 14.1 g/dL (ref 12.0–15.0)
MCH: 27.8 pg (ref 26.0–34.0)
MCHC: 31.3 g/dL (ref 30.0–36.0)
MCV: 88.8 fL (ref 80.0–100.0)
Platelets: 169 10*3/uL (ref 150–400)
RBC: 5.08 MIL/uL (ref 3.87–5.11)
RDW: 13.3 % (ref 11.5–15.5)
WBC: 6.6 10*3/uL (ref 4.0–10.5)
nRBC: 0 % (ref 0.0–0.2)

## 2021-08-23 LAB — BASIC METABOLIC PANEL
Anion gap: 8 (ref 5–15)
BUN: 16 mg/dL (ref 8–23)
CO2: 24 mmol/L (ref 22–32)
Calcium: 9.3 mg/dL (ref 8.9–10.3)
Chloride: 105 mmol/L (ref 98–111)
Creatinine, Ser: 1.01 mg/dL — ABNORMAL HIGH (ref 0.44–1.00)
GFR, Estimated: 58 mL/min — ABNORMAL LOW (ref >60)
Glucose, Bld: 230 mg/dL — ABNORMAL HIGH (ref 70–99)
Potassium: 4 mmol/L (ref 3.5–5.1)
Sodium: 137 mmol/L (ref 135–145)

## 2021-08-23 LAB — TROPONIN I (HIGH SENSITIVITY): Troponin I (High Sensitivity): 6 ng/L (ref <18)

## 2021-08-23 NOTE — ED Triage Notes (Signed)
Patient BIB [facility] from home for evaluation after patient had a low reading from her home blood pressure monitor, subsequent blood pressure measurements on same device were normal. Patient called her retired PCP who told her to take '150mg'$  (6x '25mg'$ )benadryl to increase her blood pressure. Patient also complains of "a ball of hurt" in the center of her chest. Patient has no chest pain at this time.  147/88 HR 78 97% on room air CBG 323

## 2021-08-23 NOTE — ED Notes (Signed)
Pt no longer wanted to wait  

## 2021-08-23 NOTE — ED Provider Triage Note (Signed)
Emergency Medicine Provider Triage Evaluation Note  Mariah Reed , a 76 y.o. female  was evaluated in triage.  Pt complains of chest pain.  Patient states that symptoms been present for the past 3 to 4 days.  She describes pain as "ball of pain in chest."  She denies associated shortness of breath.  She got most concerned about a low blood pressure reading of approximately 94 systolic at home.  She states that she checks her blood pressure about every hour.  She took two 25 mg Benadryl around 11 AM this morning and then another two 25 mg Benadryl around 3 PM this afternoon expecting it to raise her blood pressure.  She currently still endorses chest pain.  Denies fever, chills, night sweats, abdominal pain, nausea/vomiting/diarrhea, cough, urinary/vaginal symptoms, change in bowel habits.  Review of Systems  Positive: See above Negative:   Physical Exam  BP (!) 154/77 (BP Location: Right Arm)   Pulse 79   Temp 98.4 F (36.9 C) (Oral)   Resp 16   SpO2 97%  Gen:   Awake, no distress   Resp:  Normal effort  MSK:   Moves extremities without difficulty  Other:  No murmurs gallops or rubs appreciated.  No rales, rhonchi, wheeze auscultated.  Patient is tender to palpation midsternal lower.  Medical Decision Making  Medically screening exam initiated at 3:57 PM.  Appropriate orders placed.  ALAIZA YAU was informed that the remainder of the evaluation will be completed by another provider, this initial triage assessment does not replace that evaluation, and the importance of remaining in the ED until their evaluation is complete.     Wilnette Kales, [place] 08/23/21 1600

## 2021-09-07 DIAGNOSIS — R944 Abnormal results of kidney function studies: Secondary | ICD-10-CM | POA: Diagnosis not present

## 2021-09-07 DIAGNOSIS — R058 Other specified cough: Secondary | ICD-10-CM | POA: Diagnosis not present

## 2021-09-07 DIAGNOSIS — R0981 Nasal congestion: Secondary | ICD-10-CM | POA: Diagnosis not present

## 2021-09-07 DIAGNOSIS — J069 Acute upper respiratory infection, unspecified: Secondary | ICD-10-CM | POA: Diagnosis not present

## 2021-09-15 ENCOUNTER — Ambulatory Visit: Payer: Medicare Other | Admitting: Urology

## 2021-09-18 ENCOUNTER — Ambulatory Visit
Admission: RE | Admit: 2021-09-18 | Discharge: 2021-09-18 | Disposition: A | Discharge status: Home / Self Care | Payer: Medicare Other | Source: Ambulatory Visit | Attending: Internal Medicine | Admitting: Internal Medicine

## 2021-09-18 DIAGNOSIS — G8929 Other chronic pain: Secondary | ICD-10-CM | POA: Diagnosis not present

## 2021-09-18 DIAGNOSIS — M47816 Spondylosis without myelopathy or radiculopathy, lumbar region: Secondary | ICD-10-CM | POA: Diagnosis not present

## 2021-09-18 DIAGNOSIS — M5440 Lumbago with sciatica, unspecified side: Secondary | ICD-10-CM | POA: Insufficient documentation

## 2021-09-18 DIAGNOSIS — M4316 Spondylolisthesis, lumbar region: Secondary | ICD-10-CM | POA: Diagnosis not present

## 2021-09-18 DIAGNOSIS — M545 Low back pain, unspecified: Secondary | ICD-10-CM | POA: Diagnosis not present

## 2021-09-22 DIAGNOSIS — B372 Candidiasis of skin and nail: Secondary | ICD-10-CM | POA: Diagnosis not present

## 2021-09-22 DIAGNOSIS — M5440 Lumbago with sciatica, unspecified side: Secondary | ICD-10-CM | POA: Diagnosis not present

## 2021-09-22 DIAGNOSIS — G8929 Other chronic pain: Secondary | ICD-10-CM | POA: Diagnosis not present

## 2021-09-22 DIAGNOSIS — E118 Type 2 diabetes mellitus with unspecified complications: Secondary | ICD-10-CM | POA: Diagnosis not present

## 2021-09-22 LAB — HEMOGLOBIN A1C: Hemoglobin A1C: 9.2

## 2021-09-27 DIAGNOSIS — E119 Type 2 diabetes mellitus without complications: Secondary | ICD-10-CM | POA: Diagnosis not present

## 2021-09-27 DIAGNOSIS — M3501 Sicca syndrome with keratoconjunctivitis: Secondary | ICD-10-CM | POA: Diagnosis not present

## 2021-09-27 DIAGNOSIS — H2513 Age-related nuclear cataract, bilateral: Secondary | ICD-10-CM | POA: Diagnosis not present

## 2021-09-27 LAB — HM DIABETES EYE EXAM

## 2021-11-08 DIAGNOSIS — K580 Irritable bowel syndrome with diarrhea: Secondary | ICD-10-CM | POA: Diagnosis not present

## 2021-11-15 ENCOUNTER — Ambulatory Visit: Payer: Medicare Other | Admitting: Urology

## 2021-11-23 DIAGNOSIS — K580 Irritable bowel syndrome with diarrhea: Secondary | ICD-10-CM | POA: Diagnosis not present

## 2021-11-27 ENCOUNTER — Encounter: Payer: Self-pay | Admitting: Internal Medicine

## 2021-11-27 ENCOUNTER — Ambulatory Visit (INDEPENDENT_AMBULATORY_CARE_PROVIDER_SITE_OTHER): Payer: Medicare Other | Admitting: Internal Medicine

## 2021-11-27 VITALS — BP 132/76 | HR 92 | Temp 98.1°F | Resp 18 | Ht 65.0 in | Wt 200.2 lb

## 2021-11-27 DIAGNOSIS — J431 Panlobular emphysema: Secondary | ICD-10-CM | POA: Diagnosis not present

## 2021-11-27 DIAGNOSIS — E039 Hypothyroidism, unspecified: Secondary | ICD-10-CM

## 2021-11-27 DIAGNOSIS — I509 Heart failure, unspecified: Secondary | ICD-10-CM

## 2021-11-27 DIAGNOSIS — I252 Old myocardial infarction: Secondary | ICD-10-CM

## 2021-11-27 DIAGNOSIS — I10 Essential (primary) hypertension: Secondary | ICD-10-CM

## 2021-11-27 DIAGNOSIS — Z23 Encounter for immunization: Secondary | ICD-10-CM | POA: Diagnosis not present

## 2021-11-27 DIAGNOSIS — G629 Polyneuropathy, unspecified: Secondary | ICD-10-CM | POA: Diagnosis not present

## 2021-11-27 DIAGNOSIS — E118 Type 2 diabetes mellitus with unspecified complications: Secondary | ICD-10-CM

## 2021-11-27 DIAGNOSIS — E782 Mixed hyperlipidemia: Secondary | ICD-10-CM

## 2021-11-27 DIAGNOSIS — Z9109 Other allergy status, other than to drugs and biological substances: Secondary | ICD-10-CM

## 2021-11-27 MED ORDER — SAVELLA 25 MG PO TABS: 25.0000 mg | ORAL_TABLET | Freq: Every day | ORAL | 1 refills | Status: DC

## 2021-11-27 NOTE — Progress Notes (Signed)
New Patient Office Visit  Subjective    Patient ID: Mariah Reed, female    DOB: February 08, 1945  Age: 76 y.o. MRN: [id number]  CC:  Chief Complaint  Patient presents with   Establish Care    HPI TARSHA BLANDO presents to establish care.  Had been seeing a PCP in [place], however the drive was too far for her.  She does living [place].  Hypertension/CHF: -Medications: Losartan 50 mg, nifedipine 30 mg, only taking metoprolol 25 mg BID when pulse is high but states it makes her blood pressure too low, nitroglycerin as needed -Patient is compliant with above medications and reports no side effects. -Checking BP at home (average): states blood pressure goes up and down  -Denies any SOB, CP, vision changes, LE edema or symptoms of hypotension. Does have some orthopnea.  -Most recent echo 01/24/21 showing EF 50-55% -Does follow with cardiology, note reviewed from 06/19/2021.  HLD/Hx of MI: -Medications: Lovastatin 40 mg BID -not on aspirin secondary to history of GI bleed -Patient is compliant with above medications and reports no side effects.  -Had an [place] but did not go to the hospital at the time -Was in the hospital for kidney stones in December 2022, was also having chest pain at that time.  Chest CTA from 01/23/2021 showing mild LDA calcification, stress test from 01/27/2021 without significant perfusion abnormalities -Last lipid panel: Lipid Panel     Component Value Date/Time   CHOL 168 01/26/2021 0546   CHOL 170 10/27/2019 1009   TRIG 186 (H) 01/26/2021 0546   HDL 22 (L) 01/26/2021 0546   HDL 45 10/27/2019 1009   CHOLHDL 7.6 01/26/2021 0546   VLDL 37 01/26/2021 0546   LDLCALC 109 (H) 01/26/2021 0546   LDLCALC 95 10/27/2019 1009   LDLCALC 113 (H) 02/10/2018 1123   LDLDIRECT 71.0 03/17/2019 1135   LABVLDL 30 10/27/2019 1009   Diabetes, Type 2: -Last A1c 8/23, 9.2 -Medications: Jardiance 25 mg, glipizide 10 mg twice daily, Levemir 8 units twice a day, Trajenta  5 mg -Patient is compliant with the above medications and reports no side effects.  -Checking BG at home: checking but uncertain numbers  -Eye exam: States she just had an eye exam but is uncertain if this included a diabetic eye exam -Foot exam: Due -Microalbumin: Due -Statin: Yes -PNA vaccine: Due for Prevnar 20  COPD: -COPD status: stable -Current medications: Spiriva, albuterol as needed -Satisfied with current treatment?: yes -Oxygen use: no -Dyspnea frequency: Occasionally -Cough frequency: no -Rescue inhaler frequency: Nearly daily -Limitation of activity: no -Productive cough: No -Pneumovax: Not up to Date -Influenza: Not up to Date  Environmental Allergies:  -Currently on Allegra and Mucinex   Hypothyroidism: -Medications: Levothyroxine 75 mcg -Patient is compliant with the above medication (s) at the above dose and reports no medication side effects.  -Denies weight changes, cold./heat intolerance, skin changes, anxiety/palpitations  -Last TSH: 4/23, 2.820  Neuropathy:  -Currently on Lyrica 25 mg - couldn't tolerate higher dose due to weakness  -Previous doctor mentioned Savella as a treatment option   Outpatient Encounter Medications as of 11/27/2021  Medication Sig   ACCU-CHEK FASTCLIX LANCETS MISC 1 Device by Does not apply route 3 (three) times daily. Use to check blood sugar up to three times a day   albuterol (VENTOLIN HFA) 108 (90 Base) MCG/ACT inhaler INHALE 2 PUFFS INTO THE LUNGS EVERY 4 HOURS AS NEEDED   alum & mag hydroxide-simeth (MAALOX/MYLANTA) 200-200-20 MG/5ML suspension Take 30  mLs by mouth 2 (two) times daily.   diclofenac (FLECTOR) 1.3 % PTCH Place 1 patch onto the skin 2 (two) times daily.   diclofenac sodium (VOLTAREN) 1 % GEL APPLY 2 GRAMS TOPICALLY 4 TIMES DAILY (Patient taking differently: as needed.)   diphenoxylate-atropine (LOMOTIL) 2.5-0.025 MG tablet Take 2 tablets by mouth in the morning, at noon, and at bedtime. Take with imodium    empagliflozin (JARDIANCE) 25 MG TABS tablet Take 25 mg by mouth daily after lunch.   esomeprazole (NEXIUM) 40 MG capsule TAKE 1 CAPSULE BY MOUTH ONCE DAILY   fluticasone (FLONASE) 50 MCG/ACT nasal spray Place 2 sprays into both nostrils daily.   glipiZIDE (GLUCOTROL) 10 MG tablet Take 1 tablet (10 mg total) by mouth 2 (two) times daily before a meal.   levothyroxine (SYNTHROID) 75 MCG tablet Take 1 tablet (75 mcg total) by mouth daily.   loperamide (IMODIUM) 2 MG capsule Take 8 mg by mouth 2 (two) times daily. Take with lomotil   losartan (COZAAR) 50 MG tablet Take 50 mg by mouth daily.   lovastatin (MEVACOR) 40 MG tablet Take 1 tablet (40 mg total) by mouth 2 (two) times daily.   metoprolol tartrate (LOPRESSOR) 25 MG tablet Take 1 tablet (25 mg total) by mouth 2 (two) times daily.   NIFEdipine (ADALAT CC) 30 MG 24 hr tablet Take 1 tablet (30 mg total) by mouth daily.   nitroGLYCERIN (NITROSTAT) 0.4 MG SL tablet Take SL prn chest pain/May repeat 5 min apart up to 3 times prn   nystatin cream (MYCOSTATIN) APPLY TO AFFECTED AREAS ONCE DAILY AS NEEDED   pregabalin (LYRICA) 25 MG capsule Take 1 capsule (25 mg total) by mouth 2 (two) times daily.   tiotropium (SPIRIVA HANDIHALER) 18 MCG inhalation capsule INHALE THE CONTENTS OF 1 CAPSUOLE VIA HANDIHALER ONCE DAILY   tiZANidine (ZANAFLEX) 4 MG tablet Take 4 mg by mouth See admin instructions. Take one tablet (4 mg) by mouth twice daily - at bedtime and 1am   topiramate (TOPAMAX) 100 MG tablet Take 3 tablets (300 mg total) by mouth daily.   zolpidem (AMBIEN) 5 MG tablet Take 1 tablet (5 mg total) by mouth at bedtime as needed. for sleep   chlorpheniramine (CHLOR-TRIMETON) 4 MG tablet Take 4 mg by mouth 2 (two) times daily as needed for allergies.   hydrOXYzine (ATARAX/VISTARIL) 50 MG tablet Take 1 tablet (50 mg total) by mouth 3 (three) times daily as needed. (Patient not taking: Reported on 11/27/2021)   [DISCONTINUED] acetaminophen (TYLENOL) 325 MG  tablet Take 2 tablets (650 mg total) by mouth every 6 (six) hours as needed for mild pain (or Fever >/= 101).   [DISCONTINUED] ezetimibe (ZETIA) 10 MG tablet Take 1 tablet (10 mg total) by mouth daily.   [DISCONTINUED] FEXOFENADINE HCL PO Take 0.5 tablets by mouth daily.   [DISCONTINUED] guaiFENesin (MUCINEX) 600 MG 12 hr tablet Take by mouth at bedtime.   [DISCONTINUED] guaiFENesin-dextromethorphan (ROBITUSSIN DM) 100-10 MG/5ML syrup Take 5 mLs by mouth every 4 (four) hours as needed for cough. (Patient not taking: Reported on 04/14/2021)   [DISCONTINUED] lipase/protease/amylase (CREON) 36000 UNITS CPEP capsule Take 36,000-72,000 Units by mouth See admin instructions. Take 2 capsules (72000 units) by mouth three times daily with meals, take 1 capsule (36000 units) with snacks or drinks   [DISCONTINUED] meclizine (ANTIVERT) 25 MG tablet Take 25 mg by mouth 3 (three) times daily as needed for dizziness. (Patient not taking: Reported on 06/19/2021)   [DISCONTINUED] menthol-cetylpyridinium (CEPACOL) 3  MG lozenge Take 1 lozenge (3 mg total) by mouth as needed for sore throat. (Patient not taking: Reported on 04/14/2021)   [DISCONTINUED] ondansetron (ZOFRAN) 4 MG tablet Take 1 tablet (4 mg total) by mouth every 6 (six) hours as needed for nausea. (Patient not taking: Reported on 04/14/2021)   [DISCONTINUED] oxybutynin (DITROPAN) 5 MG tablet Take 1 tablet (5 mg total) by mouth 3 (three) times daily as needed for bladder spasms. (Patient not taking: Reported on 04/14/2021)   [DISCONTINUED] oxyCODONE (OXYCONTIN) 20 mg 12 hr tablet Take 1 tablet (20 mg total) by mouth every 12 (twelve) hours.   [DISCONTINUED] PATADAY 0.2 % SOLN PLACE 1 DROP INTO BOTH EYES EVERY DAY   [DISCONTINUED] sulfamethoxazole-trimethoprim (BACTRIM DS) 800-160 MG tablet Take 1 tablet by mouth every 12 (twelve) hours. (Patient not taking: Reported on 06/19/2021)   No facility-administered encounter medications on file as of 11/27/2021.    Past  Medical History:  Diagnosis Date   Allergy    Anemia    Anginal pain (HCC)    Arthritis    CHF (congestive heart failure) (HCC)    COPD (chronic obstructive pulmonary disease) (HCC)    Diabetes mellitus    GERD (gastroesophageal reflux disease)    Gout    Headache    History of kidney stones    Hyperlipidemia    Hypertension    Hypothyroidism    LBBB (left bundle branch block)    Low back pain    Myocardial infarction ([place])    Nephrolithiasis    Peripheral neuropathy    Pneumonia    Rhabdomyolysis    a.) noted during 12/16-12/25/2022 admission for sepsis 2/2 RIGHT ureteral and RIGHT staghorn nephrolithiasis.   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    Past Surgical History:  Procedure Laterality Date   ABDOMINAL HYSTERECTOMY     CHOLECYSTECTOMY  unknown   Patient stated she does not remember when it was removed. (possibly 1997)   [place], URETEROSCOPY AND STENT PLACEMENT Right 01/20/2021   Procedure: [place], URETEROSCOPY AND STENT PLACEMENT;  Surgeon: Janith Lima, MD;  Location: [facility];  Service: Urology;  Laterality: Right;   CYSTOSCOPY/URETEROSCOPY/HOLMIUM LASER/STENT PLACEMENT Right 03/14/2021   Procedure: CYSTOSCOPY/URETEROSCOPY/HOLMIUM LASER/STENT PLACEMENT;  Surgeon: Abbie Sons, MD;  Location: [facility];  Service: Urology;  Laterality: Right;   CYSTOSCOPY/URETEROSCOPY/HOLMIUM LASER/STENT PLACEMENT Right 05/16/2021   Procedure: CYSTOSCOPY/URETEROSCOPY/HOLMIUM LASER/STENT REMOVAL VS EXCHANGE;  Surgeon: Abbie Sons, MD;  Location: [facility];  Service: Urology;  Laterality: Right;   POLYPECTOMY     Colon   TUBAL LIGATION      Family History  Problem Relation Age of Onset   Uterine cancer Mother     Social History   Socioeconomic History   Marital status: Legally Separated    Spouse name: Lavonna Lampron   Number of children:  Not on file   Years of education: Not on file   Highest education level: Not on file  Occupational History    Employer: DISABLED  Tobacco Use   Smoking status: Former    Packs/day: 2.50    Years: 10.00    Total pack years: 25.00    Types: Cigarettes   Smokeless tobacco: Never  Vaping Use   Vaping Use: Never used  Substance and Sexual Activity   Alcohol use: No   Drug use: No   Sexual activity: Not Currently  Other Topics  Concern   Not on file  Social History Narrative   Pt would like [name] at [phone] (nephew) to be called in case of emergency and ask him to call her daughter [name] at [phone].   Social Determinants of Health   Financial Resource Strain: Not on file  Food Insecurity: Not on file  Transportation Needs: Not on file  Physical Activity: Not on file  Stress: Not on file  Social Connections: Not on file  Intimate Partner Violence: Not on file    Review of Systems  All other systems reviewed and are negative.       Objective    BP 132/76   Pulse 92   Temp 98.1 F (36.7 C)   Resp 18   Ht '5\' 5"'$  (1.651 m)   Wt 200 lb 3.2 oz (90.8 kg)   SpO2 92%   BMI 33.32 kg/m   Physical Exam Constitutional:      Appearance: Normal appearance.  HENT:     Head: Normocephalic and atraumatic.     Mouth/Throat:     Mouth: Mucous membranes are moist.     Pharynx: Oropharynx is clear.  Eyes:     Extraocular Movements: Extraocular movements intact.     Conjunctiva/sclera: Conjunctivae normal.     Pupils: Pupils are equal, round, and reactive to light.  Cardiovascular:     Rate and Rhythm: Normal rate and regular rhythm.  Pulmonary:     Effort: Pulmonary effort is normal.     Breath sounds: Normal breath sounds.  Musculoskeletal:     Right lower leg: No edema.     Left lower leg: No edema.  Skin:    General: Skin is warm and dry.  Neurological:     General: No focal deficit present.     Mental Status: She is alert. Mental  status is at baseline.  Psychiatric:        Mood and Affect: Mood normal.        Behavior: Behavior normal.         Assessment & Plan:   1. Primary hypertension/Chronic congestive heart failure, unspecified heart failure type [facility]): Patient does not take medication as directed.  She states she only takes metoprolol when her "pulse is too high" stating about 106.  She then notices a drop in blood pressure after she takes the metoprolol but only in combination with the losartan.  Discussed holding the losartan, continuing the nifedipine 30 mg and metoprolol 25 mg twice daily.  Patient does follow with cardiology, last echo, stress test results reviewed from December 2022.  Cardiology note from 06/19/2021 reviewed.  2. Mixed hyperlipidemia/History of MI (myocardial infarction): Patient is compliant with lovastatin 40 mg twice daily.  She cannot be on aspirin secondary to history of GI bleed.  3. Diabetes mellitus type 2 with complications ([place]): Uncontrolled.  Last A1c in August 9 0.2.  The patient is on Levemir 8 units twice daily, Tradjenta 5 mg, Jardiance 25 mg and glipizide 10 mg twice daily.  She states she is checking her blood sugar but cannot recall any specific values.  She will bring in her blood sugar log to her next appointment in 1 month, we will recheck A1c and do her foot exam.  4. Panlobular emphysema ([place]): Stable.  Continue Spiriva and albuterol as needed.  5. Environmental allergies: Stable.  Continue Allegra daily.  6. Hypothyroidism, unspecified type: Stable.  Reviewed last TSH that was normal in February 2023.  Continue levothyroxine  75 mcg daily.  7. Neuropathy: Has history of longstanding neuropathy, especially in her lower extremities that cause her a lot of pain.  She is currently on Lyrica 75 mg, states that she cannot tolerate higher dose.  Her previous doctor talked her about starting [place] but this was not covered by her insurance.  8. Need for influenza  vaccination: Flu vaccine administered today.  - Flu Vaccine QUAD High Dose(Fluad)  Return in about 4 weeks (around 12/25/2021).   Teodora Medici, DO

## 2021-11-27 NOTE — Addendum Note (Signed)
Addended by: Teodora Medici on: 11/27/2021 01:34 PM   Modules accepted: Orders

## 2021-12-05 ENCOUNTER — Other Ambulatory Visit: Payer: Self-pay | Admitting: Internal Medicine

## 2021-12-05 NOTE — Telephone Encounter (Signed)
Requested medication (s) are due for refill today: yes  Requested medication (s) are on the active medication list: yes except Spirolactone    Last refill: Topiramate  10/27/19  #90  5 refills    Zanaflex  01/20/21 No amount      Albuterol  05/22/19  18g 11 r     Flector 11/26/19  #60 0 refills  Future visit scheduled yes 12/25/21  Notes to clinic: All meds except Spirolactone by historical providers. Spirolactone is not on current med profile.  Please review.   Requested Prescriptions  Pending Prescriptions Disp Refills   spironolactone (ALDACTONE) 50 MG tablet [Pharmacy Med Name: SPIRONOLACTONE 50 MG TAB] 30 tablet     Sig: TAKE 1 TABLET BY MOUTH ONCE DAILY AS NEEDED FOR SWELLING OF LEGS     Cardiovascular: Diuretics - Aldosterone Antagonist Failed - 12/05/2021  2:16 PM      Failed - Cr in normal range and within 180 days    Creat  Date Value Ref Range Status  02/10/2018 0.83 mg/dL Final    Comment:    . Age and/or gender not provided. Unable to calculate eGFR. Marland Kitchen           Reference Range           Female:   0.70-1.18           Female: 0.60-0.93           Unable to flag appropriately           due to gender not provided. . For patients >35 years of age, the reference limit for Creatinine is approximately 13% higher for people identified as African-American. .    Creatinine, Ser  Date Value Ref Range Status  08/23/2021 1.01 (H) 0.44 - 1.00 mg/dL Final   Creatinine, POC  Date Value Ref Range Status  05/07/2017 50 (4.4) mg/dL Final   Creatinine,U  Date Value Ref Range Status  03/17/2019 141.0 mg/dL Final         Passed - K in normal range and within 180 days    Potassium  Date Value Ref Range Status  08/23/2021 4.0 3.5 - 5.1 mmol/L Final         Passed - Na in normal range and within 180 days    Sodium  Date Value Ref Range Status  08/23/2021 137 135 - 145 mmol/L Final  10/27/2019 139 134 - 144 mmol/L Final         Passed - eGFR is 30 or above and within 180  days    GFR calc Af Amer  Date Value Ref Range Status  10/27/2019 78 >59 mL/min/1.73 Final    Comment:    **[facility] currently reports eGFR in compliance with the current**   recommendations of the [employer]. [facility] will   update reporting as new guidelines are published from the NKF-ASN   Task force.    GFR, Estimated  Date Value Ref Range Status  08/23/2021 58 (L) >60 mL/min Final    Comment:    (NOTE) Calculated using the CKD-EPI Creatinine Equation (2021)    GFR  Date Value Ref Range Status  03/17/2019 72.10 >60.00 mL/min Final         Passed - Last BP in normal range    BP Readings from Last 1 Encounters:  11/27/21 132/76         Passed - Valid encounter within last 6 months    Recent Outpatient Visits  1 week ago Primary hypertension   [facility] Teodora Medici, DO   2 years ago Hypertension associated with diabetes [facility])   [facility], Dionne Bucy, MD       Future Appointments             In 2 weeks Teodora Medici, [facility], [place]   In 4 months Carollee Leitz, MD [place], [facility]             topiramate (TOPAMAX) 100 MG tablet [Pharmacy Med Name: TOPIRAMATE 100 MG TAB] 90 tablet 5    Sig: TAKE 3 TABLETS BY MOUTH ONCE DAILY     Neurology: Anticonvulsants - topiramate & zonisamide Failed - 12/05/2021  2:16 PM      Failed - Cr in normal range and within 360 days    Creat  Date Value Ref Range Status  02/10/2018 0.83 mg/dL Final    Comment:    . Age and/or gender not provided. Unable to calculate eGFR. Marland Kitchen           Reference Range           Female:   0.70-1.18           Female: 0.60-0.93           Unable to flag appropriately           due to gender not provided. . For patients >89 years of age, the reference limit for Creatinine is approximately 13% higher for people identified as African-American. .    Creatinine, Ser   Date Value Ref Range Status  08/23/2021 1.01 (H) 0.44 - 1.00 mg/dL Final   Creatinine, POC  Date Value Ref Range Status  05/07/2017 50 (4.4) mg/dL Final   Creatinine,U  Date Value Ref Range Status  03/17/2019 141.0 mg/dL Final         Passed - CO2 in normal range and within 360 days    CO2  Date Value Ref Range Status  08/23/2021 24 22 - 32 mmol/L Final   Bicarbonate  Date Value Ref Range Status  01/20/2021 19.7 (L) 20.0 - 28.0 mmol/L Final         Passed - ALT in normal range and within 360 days    ALT  Date Value Ref Range Status  01/29/2021 22 0 - 44 U/L Final         Passed - AST in normal range and within 360 days    AST  Date Value Ref Range Status  01/29/2021 19 15 - 41 U/L Final         Passed - Completed PHQ-2 or PHQ-9 in the last 360 days      Passed - Valid encounter within last 12 months    Recent Outpatient Visits           1 week ago Primary hypertension   [place], DO   2 years ago Hypertension associated with diabetes [facility])   [place], Dionne Bucy, MD       Future Appointments             In 2 weeks Teodora Medici, [facility], [place]   In 4 months Carollee Leitz, MD [place], [facility]             tiZANidine (ZANAFLEX) 4 MG tablet [Pharmacy Med Name: TIZANIDINE HCL 4 MG TAB] 60 tablet  Sig: TAKE 1 TABLET BY MOUTH 2 TIMES DAILY AS NEEDED     Not Delegated - Cardiovascular:  Alpha-2 Agonists - tizanidine Failed - 12/05/2021  2:16 PM      Failed - This refill cannot be delegated      Passed - Valid encounter within last 6 months    Recent Outpatient Visits           1 week ago Primary hypertension   [place], DO   2 years ago Hypertension associated with diabetes [facility])   [facility], Dionne Bucy, MD       Future Appointments             In 2 weeks  Teodora Medici, [facility], [place]   In 4 months Carollee Leitz, MD [place], [facility]             albuterol (VENTOLIN HFA) 108 (90 Base) MCG/ACT inhaler [Pharmacy Med Name: ALBUTEROL SULFATE HFA 108 (90 BASE)] 18 g 11    Sig: INHALE 2 INHALATIONS INTO THE LUNGS EVERY 4 HOURS AS NEEDED FOR WHEEZING     Pulmonology:  Beta Agonists 2 Passed - 12/05/2021  2:16 PM      Passed - Last BP in normal range    BP Readings from Last 1 Encounters:  11/27/21 132/76         Passed - Last Heart Rate in normal range    Pulse Readings from Last 1 Encounters:  11/27/21 92         Passed - Valid encounter within last 12 months    Recent Outpatient Visits           1 week ago Primary hypertension   [place], DO   2 years ago Hypertension associated with diabetes [facility])   [facility], Dionne Bucy, MD       Future Appointments             In 2 weeks Teodora Medici, DO [facility], [place]   In 4 months Carollee Leitz, MD [place], [place] 1.3 % [facility] [Pharmacy Med Name: FLECTOR 1.3% TOP PAT]      Sig: APPLY PATCH TO THE MOST PAINFUL AREA ONCE OR TWICE DAILY     Analgesics:  Topicals Failed - 12/05/2021  2:16 PM      Failed - Manual Review: Labs are only required if the patient has taken medication for more than 8 weeks.      Failed - Cr in normal range and within 360 days    Creat  Date Value Ref Range Status  02/10/2018 0.83 mg/dL Final    Comment:    . Age and/or gender not provided. Unable to calculate eGFR. Marland Kitchen           Reference Range           Female:   0.70-1.18           Female: 0.60-0.93           Unable to flag appropriately           due to gender not provided. . For patients >51 years of age, the reference limit for Creatinine is approximately 13% higher for people identified as African-American. .     Creatinine, Ser  Date Value Ref Range Status  08/23/2021 1.01 (H) 0.44 - 1.00 mg/dL Final   Creatinine, POC  Date Value Ref Range Status  05/07/2017 50 (4.4) mg/dL Final   Creatinine,U  Date Value Ref Range Status  03/17/2019 141.0 mg/dL Final         Passed - PLT in normal range and within 360 days    Platelets  Date Value Ref Range Status  08/23/2021 169 150 - 400 K/uL Final         Passed - HGB in normal range and within 360 days    Hemoglobin  Date Value Ref Range Status  08/23/2021 14.1 12.0 - 15.0 g/dL Final         Passed - HCT in normal range and within 360 days    HCT  Date Value Ref Range Status  08/23/2021 45.1 36.0 - 46.0 % Final         Passed - eGFR is 30 or above and within 360 days    GFR calc Af Amer  Date Value Ref Range Status  10/27/2019 78 >59 mL/min/1.73 Final    Comment:    **[facility] currently reports eGFR in compliance with the current**   recommendations of the [employer]. [facility] will   update reporting as new guidelines are published from the NKF-ASN   Task force.    GFR, Estimated  Date Value Ref Range Status  08/23/2021 58 (L) >60 mL/min Final    Comment:    (NOTE) Calculated using the CKD-EPI Creatinine Equation (2021)    GFR  Date Value Ref Range Status  03/17/2019 72.10 >60.00 mL/min Final         Passed - Patient is not pregnant      Passed - Valid encounter within last 12 months    Recent Outpatient Visits           1 week ago Primary hypertension   [place], DO   2 years ago Hypertension associated with diabetes [facility])   [facility], Dionne Bucy, MD       Future Appointments             In 2 weeks Teodora Medici, DO [facility], [place]   In 4 months Carollee Leitz, MD [facility], [facility]

## 2021-12-06 ENCOUNTER — Telehealth: Payer: Self-pay | Admitting: Internal Medicine

## 2021-12-06 NOTE — Telephone Encounter (Signed)
Left detailed vm °

## 2021-12-06 NOTE — Telephone Encounter (Signed)
Since savella was denied were u going to try cymbalta? Or discuss at next f/u?

## 2021-12-06 NOTE — Telephone Encounter (Signed)
Pt called and states she has been waiting / her Milnacipran HCl (SAVELLA) 25 MG TABS needs PA and she stated she spoke with the pharmacy and they havent heard back / please advise asap

## 2021-12-08 ENCOUNTER — Telehealth: Payer: Self-pay | Admitting: *Deleted

## 2021-12-08 ENCOUNTER — Other Ambulatory Visit: Payer: Self-pay | Admitting: Internal Medicine

## 2021-12-08 DIAGNOSIS — G43901 Migraine, unspecified, not intractable, with status migrainosus: Secondary | ICD-10-CM

## 2021-12-08 MED ORDER — TOPIRAMATE 100 MG PO TABS: 300.0000 mg | ORAL_TABLET | Freq: Every day | ORAL | 2 refills | Status: DC

## 2021-12-08 NOTE — Telephone Encounter (Signed)
Patient wanted to inform PCP she received a Topamax refill last night and the [place] will contact PCP regarding future refills.   Patient sated she gave PCP a paper with a list of medications and if she sees anything noted from 2021 it was just an outline of her past medications.

## 2021-12-08 NOTE — Telephone Encounter (Signed)
'  Caryl Pina' from [facility] calling. States pt called to tell them Dr. Kathlene November not refill Topamax because it has not been filled since 2021. Caryl Pina states last refill was 11/07/21  for 30 days by Dr. Tressia Miners. States they have been refilling all along. Pt saw dr. Tressia Miners prior to coming to Dr. Rosana Berger. States possibly a computer error that did not bring up accurate information. Please advise.

## 2021-12-18 ENCOUNTER — Ambulatory Visit: Payer: Self-pay

## 2021-12-18 NOTE — Telephone Encounter (Signed)
Patient called, left VM to return the call to the office to discuss symptoms with a nurse.  Summary: Pt has a build up of yeast and it is causing discomfort and itching.   Pt stated she was told [facility] that she has a build up of yeast and it is causing discomfort and itching. Pt stated she was prescribed loperamide (IMODIUM) 2 MG capsule but it is not helping with the yeast build up. Pt requests call back to discuss possible solution. Cb# [phone]

## 2021-12-18 NOTE — Telephone Encounter (Signed)
Patient called, left VM to return the call to the office to discuss symptoms with a nurse.   Summary: Pt has a build up of yeast and it is causing discomfort and itching.   Pt stated she was told [facility] doctor that she has a build up of yeast and it is causing discomfort and itching. Pt stated she was prescribed loperamide (IMODIUM) 2 MG capsule but it is not helping with the yeast build up. Pt requests call back to discuss possible solution. Cb# [phone]

## 2021-12-19 ENCOUNTER — Ambulatory Visit: Payer: Self-pay | Admitting: *Deleted

## 2021-12-19 NOTE — Telephone Encounter (Signed)
  Chief Complaint: Rectal Itching Symptoms:  Rectal itching after BMs Frequency: months Pertinent Negatives: Patient denies pain Disposition: '[]'$ ED /'[]'$ Urgent Care (no appt availability in office) / '[]'$ Appointment(In office/virtual)/ '[]'$  [facility]/ '[]'$ Home Care/ '[]'$ Refused Recommended Disposition /'[]'$ [facility]/ '[x]'$  Follow-up with PCP Additional Notes: Pt states GI tested and "Lab found my gut was full of yeast" Has been taking Imodium for years for diarrhea, "The yeast built up according to that doctor but didn't tell me what to do about it."  Itching occurs after BMs, she applies different creams that help until next BM. Reports "Very gassy." Declines appt, states they already know it's yeast, I just need something for it. Care advsie provided, declines, that won't help. Please advise.

## 2021-12-19 NOTE — Telephone Encounter (Signed)
Patient called, left VM to return the call to the office to discuss symptoms with a nurse.         Summary: Pt has a build up of yeast and it is causing discomfort and itching.     Pt stated she was told [facility] doctor that she has a build up of yeast and it is causing discomfort and itching. Pt stated she was prescribed loperamide (IMODIUM) 2 MG capsule but it is not helping with the yeast build up. Pt requests call back to discuss possible solution. Cb# [phone]           Reason for Disposition  MODERATE-SEVERE rectal itching (i.e., interferes with school, work, or sleep)  Answer Assessment - Initial Assessment Questions 1. SYMPTOM:  "What's the main symptom you're concerned about?" (e.g., pain, itching, swelling, rash)     Rectal itching 2. ONSET: "When did the   start?"     months 3. RECTAL PAIN: "Do you have any pain around your rectum?" "How bad is the pain?"  (Scale 0-10; or mild, moderate, severe)   - NONE (0): no pain   - MILD (1-3): doesn't interfere with normal activities    - MODERATE (4-7): interferes with normal activities or awakens from sleep, limping    - SEVERE (8-10): excruciating pain, unable to have a bowel movement      no 4. RECTAL ITCHING: "Do you have any itching in this area?" "How bad is the itching?"  (Scale 0-10; or mild, moderate, severe)   - NONE: no itching   - MILD: doesn't interfere with normal activities    - MODERATE-SEVERE: interferes with normal activities or awakens from sleep     Severe after going to BM. Stops, occurs when BM again 5. CONSTIPATION: "Do you have constipation?" If Yes, ask: "How bad is it?"     No 6. CAUSE: "What do you think is causing the anus symptoms?"     "Yeast" 7. OTHER SYMPTOMS: "Do you have any other symptoms?"  (e.g., abdomen pain, fever, rectal bleeding, vomiting)     H/O diarrhea  Protocols used: Rectal Symptoms-A-AH

## 2021-12-25 ENCOUNTER — Ambulatory Visit: Payer: Medicare Other | Admitting: Internal Medicine

## 2022-01-02 ENCOUNTER — Other Ambulatory Visit: Payer: Self-pay

## 2022-01-02 ENCOUNTER — Other Ambulatory Visit: Payer: Self-pay | Admitting: Internal Medicine

## 2022-01-03 NOTE — Progress Notes (Unsigned)
Established Patient Office Visit  Subjective    Patient ID: Mariah Reed, female    DOB: 11-14-1945  Age: 76 y.o. MRN: [id number]  CC:  No chief complaint on file.   HPI SHLONDA DOLLOFF presents for follow-up on chronic medical conditions.  Hypertension/CHF: -Medications: Losartan 50 mg, nifedipine 30 mg, only taking metoprolol 25 mg BID when pulse is high but states it makes her blood pressure too low, nitroglycerin as needed -Patient is compliant with above medications and reports no side effects. -Checking BP at home (average): states blood pressure goes up and down  -Denies any SOB, CP, vision changes, LE edema or symptoms of hypotension. Does have some orthopnea.  -Most recent echo 01/24/21 showing EF 50-55% -Does follow with cardiology, note reviewed from 06/19/2021.  HLD/Hx of MI: -Medications: Lovastatin 40 mg BID -not on aspirin secondary to history of GI bleed -Patient is compliant with above medications and reports no side effects.  -Had an [place] but did not go to the hospital at the time -Was in the hospital for kidney stones in December 2022, was also having chest pain at that time.  Chest CTA from 01/23/2021 showing mild LDA calcification, stress test from 01/27/2021 without significant perfusion abnormalities -Last lipid panel: Lipid Panel     Component Value Date/Time   CHOL 168 01/26/2021 0546   CHOL 170 10/27/2019 1009   TRIG 186 (H) 01/26/2021 0546   HDL 22 (L) 01/26/2021 0546   HDL 45 10/27/2019 1009   CHOLHDL 7.6 01/26/2021 0546   VLDL 37 01/26/2021 0546   LDLCALC 109 (H) 01/26/2021 0546   LDLCALC 95 10/27/2019 1009   LDLCALC 113 (H) 02/10/2018 1123   LDLDIRECT 71.0 03/17/2019 1135   LABVLDL 30 10/27/2019 1009   Diabetes, Type 2: -Last A1c 8/23, 9.2 -Medications: Jardiance 25 mg, glipizide 10 mg twice daily, Levemir 8 units twice a day, Trajenta 5 mg -Patient is compliant with the above medications and reports no side effects.  -Checking BG at  home: checking but uncertain numbers  -Eye exam: States she just had an eye exam but is uncertain if this included a diabetic eye exam -Foot exam: Due -Microalbumin: Due -Statin: Yes -PNA vaccine: Due for Prevnar 20  COPD: -COPD status: stable -Current medications: Spiriva, albuterol as needed -Satisfied with current treatment?: yes -Oxygen use: no -Dyspnea frequency: Occasionally -Cough frequency: no -Rescue inhaler frequency: Nearly daily -Limitation of activity: no -Productive cough: No -Pneumovax: Not up to Date -Influenza: Not up to Date  Environmental Allergies:  -Currently on Allegra and Mucinex   Hypothyroidism: -Medications: Levothyroxine 75 mcg -Patient is compliant with the above medication (s) at the above dose and reports no medication side effects.  -Denies weight changes, cold./heat intolerance, skin changes, anxiety/palpitations  -Last TSH: 4/23, 2.820  Neuropathy:  -Currently on Lyrica 25 mg - couldn't tolerate higher dose due to weakness  -Previous doctor mentioned Savella as a treatment option   Outpatient Encounter Medications as of 01/04/2022  Medication Sig   ACCU-CHEK FASTCLIX LANCETS MISC 1 Device by Does not apply route 3 (three) times daily. Use to check blood sugar up to three times a day   albuterol (VENTOLIN HFA) 108 (90 Base) MCG/ACT inhaler INHALE 2 INHALATIONS INTO THE LUNGS EVERY 4 HOURS AS NEEDED FOR WHEEZING   alum & mag hydroxide-simeth (MAALOX/MYLANTA) 200-200-20 MG/5ML suspension Take 30 mLs by mouth 2 (two) times daily.   chlorpheniramine (CHLOR-TRIMETON) 4 MG tablet Take 4 mg by mouth 2 (two) times  daily as needed for allergies.   diclofenac sodium (VOLTAREN) 1 % GEL APPLY 2 GRAMS TOPICALLY 4 TIMES DAILY (Patient taking differently: as needed.)   diphenoxylate-atropine (LOMOTIL) 2.5-0.025 MG tablet Take 2 tablets by mouth in the morning, at noon, and at bedtime. Take with imodium   empagliflozin (JARDIANCE) 25 MG TABS tablet Take 25 mg  by mouth daily after lunch.   esomeprazole (NEXIUM) 40 MG capsule TAKE 1 CAPSULE BY MOUTH ONCE DAILY   FLECTOR 1.3 % PTCH APPLY PATCH TO THE MOST PAINFUL AREA ONCE OR TWICE DAILY   fluticasone (FLONASE) 50 MCG/ACT nasal spray Place 2 sprays into both nostrils daily.   glipiZIDE (GLUCOTROL) 10 MG tablet Take 1 tablet (10 mg total) by mouth 2 (two) times daily before a meal.   insulin detemir (LEVEMIR) 100 UNIT/ML injection Inject 8 Units into the skin 2 (two) times daily.   levothyroxine (SYNTHROID) 75 MCG tablet Take 1 tablet (75 mcg total) by mouth daily.   linagliptin (TRADJENTA) 5 MG TABS tablet Take 5 mg by mouth daily.   loperamide (IMODIUM) 2 MG capsule Take 8 mg by mouth 2 (two) times daily. Take with lomotil   losartan (COZAAR) 50 MG tablet Take 50 mg by mouth daily.   lovastatin (MEVACOR) 40 MG tablet Take 1 tablet (40 mg total) by mouth 2 (two) times daily.   metoprolol tartrate (LOPRESSOR) 25 MG tablet Take 1 tablet (25 mg total) by mouth 2 (two) times daily.   Milnacipran HCl (SAVELLA) 25 MG TABS Take 1 tablet (25 mg total) by mouth daily.   NIFEdipine (ADALAT CC) 30 MG 24 hr tablet Take 1 tablet (30 mg total) by mouth daily.   nitroGLYCERIN (NITROSTAT) 0.4 MG SL tablet Take SL prn chest pain/May repeat 5 min apart up to 3 times prn   nystatin cream (MYCOSTATIN) APPLY TO AFFECTED AREAS ONCE DAILY AS NEEDED   pregabalin (LYRICA) 25 MG capsule Take 1 capsule (25 mg total) by mouth 2 (two) times daily.   spironolactone (ALDACTONE) 50 MG tablet TAKE 1 TABLET BY MOUTH ONCE DAILY AS NEEDED FOR SWELLING OF LEGS   tiotropium (SPIRIVA HANDIHALER) 18 MCG inhalation capsule INHALE THE CONTENTS OF 1 CAPSUOLE VIA HANDIHALER ONCE DAILY   tiZANidine (ZANAFLEX) 4 MG tablet TAKE 1 TABLET BY MOUTH 2 TIMES DAILY AS NEEDED   topiramate (TOPAMAX) 100 MG tablet Take 3 tablets (300 mg total) by mouth daily.   zolpidem (AMBIEN) 5 MG tablet Take 1 tablet (5 mg total) by mouth at bedtime as needed. for sleep    No facility-administered encounter medications on file as of 01/04/2022.    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  Past Surgical History:  Procedure Laterality Date   ABDOMINAL HYSTERECTOMY     CHOLECYSTECTOMY  unknown   Patient stated she does not remember when it was removed. (possibly 1997)   [place], URETEROSCOPY AND STENT PLACEMENT Right 01/20/2021   Procedure: [place], URETEROSCOPY AND STENT PLACEMENT;  Surgeon: Janith Lima, MD;  Location: [facility];  Service: Urology;  Laterality: Right;   CYSTOSCOPY/URETEROSCOPY/HOLMIUM LASER/STENT PLACEMENT Right 03/14/2021   Procedure: CYSTOSCOPY/URETEROSCOPY/HOLMIUM LASER/STENT PLACEMENT;  Surgeon: Abbie Sons, MD;  Location: [facility];  Service: Urology;  Laterality: Right;   CYSTOSCOPY/URETEROSCOPY/HOLMIUM LASER/STENT PLACEMENT Right 05/16/2021   Procedure: CYSTOSCOPY/URETEROSCOPY/HOLMIUM LASER/STENT REMOVAL VS EXCHANGE;  Surgeon: Abbie Sons, MD;  Location: [facility];  Service: Urology;  Laterality: Right;   POLYPECTOMY     Colon   TUBAL LIGATION      Family History  Problem Relation Age of Onset   Uterine cancer Mother     Social History   Socioeconomic History    Marital status: Legally Separated    Spouse name: Khylee Algeo   Number of children: Not on file   Years of education: Not on file   Highest education level: Not on file  Occupational History    Employer: DISABLED  Tobacco Use   Smoking status: Former    Packs/day: 2.50    Years: 10.00    Total pack years: 25.00    Types: Cigarettes   Smokeless tobacco: Never  Vaping Use   Vaping Use: Never used  Substance and Sexual Activity   Alcohol use: No   Drug use: No   Sexual activity: Not Currently  Other Topics Concern   Not on file  Social History Narrative   Pt would like [name] at [phone] (nephew) to be called in case of emergency and ask him to call her daughter [name] at [phone].   Social Determinants of Health   Financial Resource Strain: Not on file  Food Insecurity: Not on file  Transportation Needs: Not on file  Physical Activity: Not on file  Stress: Not on file  Social Connections: Not on file  Intimate Partner Violence: Not on file    Review of Systems  All other systems reviewed and are negative.       Objective    There were no vitals taken for this visit.  Physical Exam Constitutional:      Appearance: Normal appearance.  HENT:     Head: Normocephalic and atraumatic.     Mouth/Throat:     Mouth: Mucous membranes are moist.     Pharynx: Oropharynx is clear.  Eyes:     Extraocular Movements: Extraocular movements intact.     Conjunctiva/sclera: Conjunctivae normal.     Pupils: Pupils are equal, round, and reactive to light.  Cardiovascular:     Rate and Rhythm: Normal rate and regular rhythm.  Pulmonary:     Effort: Pulmonary effort is normal.     Breath sounds: Normal breath sounds.  Musculoskeletal:     Right lower leg: No edema.     Left lower leg: No edema.  Skin:    General: Skin is warm and dry.  Neurological:     General: No focal deficit present.     Mental Status: She is alert. Mental status is at  baseline.  Psychiatric:        Mood and Affect: Mood normal.        Behavior: Behavior  normal.         Assessment & Plan:   1. Primary hypertension/Chronic congestive heart failure, unspecified heart failure type [facility]): Patient does not take medication as directed.  She states she only takes metoprolol when her "pulse is too high" stating about 106.  She then notices a drop in blood pressure after she takes the metoprolol but only in combination with the losartan.  Discussed holding the losartan, continuing the nifedipine 30 mg and metoprolol 25 mg twice daily.  Patient does follow with cardiology, last echo, stress test results reviewed from December 2022.  Cardiology note from 06/19/2021 reviewed.  2. Mixed hyperlipidemia/History of MI (myocardial infarction): Patient is compliant with lovastatin 40 mg twice daily.  She cannot be on aspirin secondary to history of GI bleed.  3. Diabetes mellitus type 2 with complications ([place]): Uncontrolled.  Last A1c in August 9 0.2.  The patient is on Levemir 8 units twice daily, Tradjenta 5 mg, Jardiance 25 mg and glipizide 10 mg twice daily.  She states she is checking her blood sugar but cannot recall any specific values.  She will bring in her blood sugar log to her next appointment in 1 month, we will recheck A1c and do her foot exam.  4. 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  Continue Spiriva and albuterol as needed.  5. Environmental allergies: Stable.  Continue Allegra daily.  6. Hypothyroidism, unspecified type: Stable.  Reviewed last TSH that was normal in February 2023.  Continue levothyroxine 75 mcg daily.  7. Neuropathy: Has history of longstanding neuropathy, especially in her lower extremities that cause her a lot of pain.  She is currently on Lyrica 75 mg, states that she cannot tolerate higher dose.  Her previous doctor talked her about starting [place] but this was not covered by her insurance.  8. Need for influenza vaccination: Flu  vaccine administered today.  - Flu Vaccine QUAD High Dose(Fluad)  No follow-ups on file.   Teodora Medici, DO

## 2022-01-03 NOTE — Telephone Encounter (Signed)
Requested medication (s) are due for refill today: yes  Requested medication (s) are on the active medication list: yes    Last refill: 12/08/21   #30  0 refills  Future visit scheduled yes 01/04/22  Notes to clinic:Not delegated, please review. Thank you.  Requested Prescriptions  Pending Prescriptions Disp Refills   tiZANidine (ZANAFLEX) 4 MG tablet [Pharmacy Med Name: TIZANIDINE HCL 4 MG TAB] 30 tablet 0    Sig: TAKE 1 TABLET BY MOUTH 2 TIMES DAILY AS NEEDED     Not Delegated - Cardiovascular:  Alpha-2 Agonists - tizanidine Failed - 01/02/2022  4:29 PM      Failed - This refill cannot be delegated      Passed - Valid encounter within last 6 months    Recent Outpatient Visits           1 month ago Primary hypertension   [place], DO   2 years ago Hypertension associated with diabetes [facility])   [place], Dionne Bucy, MD       Future Appointments             Tomorrow Teodora Medici, [facility], [place]   In 3 months Carollee Leitz, MD [facility], [facility]

## 2022-01-04 ENCOUNTER — Telehealth: Payer: Self-pay | Admitting: Internal Medicine

## 2022-01-04 ENCOUNTER — Ambulatory Visit (INDEPENDENT_AMBULATORY_CARE_PROVIDER_SITE_OTHER): Payer: Medicare Other | Admitting: Internal Medicine

## 2022-01-04 ENCOUNTER — Encounter: Payer: Self-pay | Admitting: Internal Medicine

## 2022-01-04 VITALS — BP 132/68 | HR 86 | Resp 16 | Ht 65.0 in | Wt 194.7 lb

## 2022-01-04 DIAGNOSIS — M159 Polyosteoarthritis, unspecified: Secondary | ICD-10-CM | POA: Diagnosis not present

## 2022-01-04 DIAGNOSIS — B379 Candidiasis, unspecified: Secondary | ICD-10-CM

## 2022-01-04 DIAGNOSIS — Z794 Long term (current) use of insulin: Secondary | ICD-10-CM | POA: Diagnosis not present

## 2022-01-04 DIAGNOSIS — E1165 Type 2 diabetes mellitus with hyperglycemia: Secondary | ICD-10-CM

## 2022-01-04 DIAGNOSIS — G629 Polyneuropathy, unspecified: Secondary | ICD-10-CM

## 2022-01-04 DIAGNOSIS — G8929 Other chronic pain: Secondary | ICD-10-CM

## 2022-01-04 LAB — POCT GLYCOSYLATED HEMOGLOBIN (HGB A1C): Hemoglobin A1C: 9.4 % — AB (ref 4.0–5.6)

## 2022-01-04 MED ORDER — FLUCONAZOLE 150 MG PO TABS: 150.0000 mg | ORAL_TABLET | Freq: Once | ORAL | 0 refills | Status: AC

## 2022-01-04 NOTE — Patient Instructions (Addendum)
It was great seeing you today!  Plan discussed at today's visit: -Continue diabetes medications with one exception, please increase Levemir to 12 units in the morning, keep Levemir 8 units in the evening -Diflucan sent to pharmacy -Referral to pain management placed  -Please check sugars in the morning and evening, write them down and bring to follow up  Follow up in: 3 months   Take care and let us know if you have any questions or concerns prior to your next visit.  Dr. Rosana Berger

## 2022-01-04 NOTE — Telephone Encounter (Signed)
Pt stated she doesn't do well from going from one office to another  due to her heart condition / pt is asking if Dr. Rosana Berger would send in a prescription for gut issue / antibiotic / please advise

## 2022-01-04 NOTE — Telephone Encounter (Signed)
Did you  tell her she needed to f/u with GI?

## 2022-01-05 LAB — MICROALBUMIN / CREATININE URINE RATIO
Creatinine, Urine: 69 mg/dL (ref 20–275)
Microalb Creat Ratio: 6 mcg/mg creat (ref <30)
Microalb, Ur: 0.4 mg/dL

## 2022-01-05 NOTE — Telephone Encounter (Signed)
Pt notified, was not happy, she don't understand she said you stated yesterday in room she had infection and does not know why you will not treat.  I told her I don't know what you all talked about in room but when you looked through GI notes there was no sign of infection or yeast, so if she was having stomach issues or needing rx for imodium and lomotil she would have to see specialist regarding this.

## 2022-01-15 ENCOUNTER — Ambulatory Visit: Payer: Self-pay | Admitting: *Deleted

## 2022-01-15 ENCOUNTER — Ambulatory Visit: Payer: Medicare Other | Admitting: Internal Medicine

## 2022-01-15 DIAGNOSIS — N898 Other specified noninflammatory disorders of vagina: Secondary | ICD-10-CM | POA: Diagnosis not present

## 2022-01-15 NOTE — Telephone Encounter (Signed)
Message from [place] sent at 01/15/2022  9:57 AM EST  Summary: pain   Patient called in says, has pain, didn't say where , she rushed off the phone, saying she is at Dr's office and is asking for Cymbalta.          Call History   Type Contact Phone/Fax User  01/15/2022 09:56 AM EST Phone (Incoming) Mariah Reed, Mariah Reed (Self) [phone] (Confidential) Bayard Beaver   Reason for Disposition . Prescription request for new medicine (not a refill)    She talked with Dr. Rosana Berger about this during visit on 01/04/2022.  Answer Assessment - Initial Assessment Questions 1. NAME of MEDICINE: "What medicine(s) are you calling about?"     Cymbalta 2. QUESTION: "What is your question?" (e.g., double dose of medicine, side effect)     I saw Dr. Rosana Berger recently (01/04/2022) and she offered me Cymbalta for the pain in my legs and I told her I thought I had taken that in the past.    I have not taken Cymbalta in the past and was wondering if she would prescribe it for me.    I was mistaken.    My legs are still really hurting a lot.    If she is agreeable to prescribing it send it to [facility]. 3. PRESCRIBER: "Who prescribed the medicine?" Reason: if prescribed by specialist, call should be referred to that group.     Dr. Teodora Medici 4. SYMPTOMS: "Do you have any symptoms?" If Yes, ask: "What symptoms are you having?"  "How bad are the symptoms (e.g., mild, moderate, severe)     Legs are really hurting.   We talked about this during my last visit recently. 5. PREGNANCY:  "Is there any chance that you are pregnant?" "When was your last menstrual period?"     N/A  Protocols used: Medication Question Call-A-AH

## 2022-01-17 NOTE — Telephone Encounter (Signed)
Pt. Calling again about trying the Cymbalta. Has not heard back from PCP. Having continued pain to legs. Please advise pt.

## 2022-01-18 ENCOUNTER — Other Ambulatory Visit: Payer: Self-pay | Admitting: Internal Medicine

## 2022-01-18 DIAGNOSIS — G8929 Other chronic pain: Secondary | ICD-10-CM

## 2022-01-18 DIAGNOSIS — G629 Polyneuropathy, unspecified: Secondary | ICD-10-CM

## 2022-01-18 MED ORDER — DULOXETINE HCL 20 MG PO CPEP: 20.0000 mg | ORAL_CAPSULE | Freq: Every day | ORAL | 1 refills | Status: DC

## 2022-01-23 ENCOUNTER — Ambulatory Visit: Payer: Medicare Other | Admitting: Internal Medicine

## 2022-02-09 ENCOUNTER — Other Ambulatory Visit: Payer: Self-pay | Admitting: Internal Medicine

## 2022-02-09 NOTE — Telephone Encounter (Signed)
Requested Prescriptions  Pending Prescriptions Disp Refills   FLECTOR 1.3 % PTCH [Pharmacy Med Name: FLECTOR 1.3% TOP PAT] 30 patch 1    Sig: APPLY PATCH TO THE MOST PAINFUL AREA ONCE OR TWICE DAILY     Analgesics:  Topicals Failed - 02/09/2022 10:42 AM      Failed - Manual Review: Labs are only required if the patient has taken medication for more than 8 weeks.      Failed - Cr in normal range and within 360 days    Creat  Date Value Ref Range Status  02/10/2018 0.83 mg/dL Final    Comment:    . Age and/or gender not provided. Unable to calculate eGFR. Marland Kitchen           Reference Range           Female:   0.70-1.18           Female: 0.60-0.93           Unable to flag appropriately           due to gender not provided. . For patients >40 years of age, the reference limit for Creatinine is approximately 13% higher for people identified as African-American. .    Creatinine, Ser  Date Value Ref Range Status  08/23/2021 1.01 (H) 0.44 - 1.00 mg/dL Final   Creatinine, POC  Date Value Ref Range Status  05/07/2017 50 (4.4) mg/dL Final   Creatinine,U  Date Value Ref Range Status  03/17/2019 141.0 mg/dL Final   Creatinine, Urine  Date Value Ref Range Status  01/04/2022 69 20 - 275 mg/dL Final         Passed - PLT in normal range and within 360 days    Platelets  Date Value Ref Range Status  08/23/2021 169 150 - 400 K/uL Final         Passed - HGB in normal range and within 360 days    Hemoglobin  Date Value Ref Range Status  08/23/2021 14.1 12.0 - 15.0 g/dL Final         Passed - HCT in normal range and within 360 days    HCT  Date Value Ref Range Status  08/23/2021 45.1 36.0 - 46.0 % Final         Passed - eGFR is 30 or above and within 360 days    GFR calc Af Amer  Date Value Ref Range Status  10/27/2019 78 >59 mL/min/1.73 Final    Comment:    **[facility] currently reports eGFR in compliance with the current**   recommendations of the [employer].  [facility] will   update reporting as new guidelines are published from the NKF-ASN   Task force.    GFR, Estimated  Date Value Ref Range Status  08/23/2021 58 (L) >60 mL/min Final    Comment:    (NOTE) Calculated using the CKD-EPI Creatinine Equation (2021)    GFR  Date Value Ref Range Status  03/17/2019 72.10 >60.00 mL/min Final         Passed - Patient is not pregnant      Passed - Valid encounter within last 12 months    Recent Outpatient Visits           1 month ago Type 2 diabetes mellitus with hyperglycemia, with long-term current use of insulin [facility])   [place], DO   2 months ago Primary hypertension   [facility] Teodora Medici,  DO   2 years ago Hypertension associated with diabetes [facility])   [place], Dionne Bucy, MD       Future Appointments             In 1 month Teodora Medici, [facility], [place]   In 2 months Carollee Leitz, MD [facility], [facility]             spironolactone (ALDACTONE) 50 MG tablet [Pharmacy Med Name: SPIRONOLACTONE 50 MG TAB] 30 tablet 1    Sig: TAKE 1 TABLET BY MOUTH ONCE DAILY AS NEEDED FOR SWELLING OF LEGS     Cardiovascular: Diuretics - Aldosterone Antagonist Failed - 02/09/2022 10:42 AM      Failed - Cr in normal range and within 180 days    Creat  Date Value Ref Range Status  02/10/2018 0.83 mg/dL Final    Comment:    . Age and/or gender not provided. Unable to calculate eGFR. Marland Kitchen           Reference Range           Female:   0.70-1.18           Female: 0.60-0.93           Unable to flag appropriately           due to gender not provided. . For patients >49 years of age, the reference limit for Creatinine is approximately 13% higher for people identified as African-American. .    Creatinine, Ser  Date Value Ref Range Status  08/23/2021 1.01 (H) 0.44 - 1.00 mg/dL Final   Creatinine, POC   Date Value Ref Range Status  05/07/2017 50 (4.4) mg/dL Final   Creatinine,U  Date Value Ref Range Status  03/17/2019 141.0 mg/dL Final   Creatinine, Urine  Date Value Ref Range Status  01/04/2022 69 20 - 275 mg/dL Final         Passed - K in normal range and within 180 days    Potassium  Date Value Ref Range Status  08/23/2021 4.0 3.5 - 5.1 mmol/L Final         Passed - Na in normal range and within 180 days    Sodium  Date Value Ref Range Status  08/23/2021 137 135 - 145 mmol/L Final  10/27/2019 139 134 - 144 mmol/L Final         Passed - eGFR is 30 or above and within 180 days    GFR calc Af Amer  Date Value Ref Range Status  10/27/2019 78 >59 mL/min/1.73 Final    Comment:    **[facility] currently reports eGFR in compliance with the current**   recommendations of the [employer]. [facility] will   update reporting as new guidelines are published from the NKF-ASN   Task force.    GFR, Estimated  Date Value Ref Range Status  08/23/2021 58 (L) >60 mL/min Final    Comment:    (NOTE) Calculated using the CKD-EPI Creatinine Equation (2021)    GFR  Date Value Ref Range Status  03/17/2019 72.10 >60.00 mL/min Final         Passed - Last BP in normal range    BP Readings from Last 1 Encounters:  01/04/22 132/68         Passed - Valid encounter within last 6 months    Recent Outpatient Visits           1 month ago Type  2 diabetes mellitus with hyperglycemia, with long-term current use of insulin [facility])   [place], DO   2 months ago Primary hypertension   [place], DO   2 years ago Hypertension associated with diabetes [facility])   [facility], Dionne Bucy, MD       Future Appointments             In 1 month Teodora Medici, [facility], [place]   In 2 months Carollee Leitz, MD [facility], [facility]

## 2022-02-12 ENCOUNTER — Other Ambulatory Visit: Payer: Self-pay | Admitting: Internal Medicine

## 2022-02-12 DIAGNOSIS — G4701 Insomnia due to medical condition: Secondary | ICD-10-CM

## 2022-02-13 NOTE — Telephone Encounter (Signed)
Requested medication (s) are due for refill today: yes   Requested medication (s) are on the active medication list: yes  Last refill:  levemir historical med, zolpidem 11/26/19  Future visit scheduled: yes  Notes to clinic:  levemir is historical med and per last OV note dose was going to be increased, zolpidem is not delegated but last prescribed by Dr. Brita Romp.      Requested Prescriptions  Pending Prescriptions Disp Refills   LEVEMIR FLEXPEN 100 UNIT/ML FlexPen [Pharmacy Med Name: LEVEMIR FLEXPEN 100 UNIT/ML SUBQ SO] 15 mL     Sig: INJECT 8 UNITS SUBCUTANEOUSLY 2 TIMES DAILY     Endocrinology:  Diabetes - Insulins Failed - 02/12/2022  5:23 PM      Failed - HBA1C is between 0 and 7.9 and within 180 days    Hemoglobin A1C  Date Value Ref Range Status  01/04/2022 9.4 (A) 4.0 - 5.6 % Final  09/22/2021 9.2  Final         Passed - Valid encounter within last 6 months    Recent Outpatient Visits           1 month ago Type 2 diabetes mellitus with hyperglycemia, with long-term current use of insulin [facility])   [place], DO   2 months ago Primary hypertension   [place], DO   2 years ago Hypertension associated with diabetes [facility])   [facility], Dionne Bucy, MD       Future Appointments             In 1 month Teodora Medici, [facility], [place]   In 2 months Carollee Leitz, MD [place], [place]             zolpidem (AMBIEN) 5 MG tablet [Pharmacy Med Name: ZOLPIDEM TARTRATE 5 MG TAB] 30 tablet     Sig: TAKE 1 TABLET BY MOUTH EVERY NIGHT AT BEDTIME     Not Delegated - Psychiatry:  Anxiolytics/Hypnotics Failed - 02/12/2022  5:23 PM      Failed - This refill cannot be delegated      Failed - Urine Drug Screen completed in last 360 days      Passed - Valid encounter within last 6 months    Recent Outpatient Visits            1 month ago Type 2 diabetes mellitus with hyperglycemia, with long-term current use of insulin [facility])   [facility] Teodora Medici, DO   2 months ago Primary hypertension   [place], DO   2 years ago Hypertension associated with diabetes [facility])   [facility], Dionne Bucy, MD       Future Appointments             In 1 month Teodora Medici, DO [facility], [place]   In 2 months Carollee Leitz, MD [facility], [facility]

## 2022-02-14 ENCOUNTER — Telehealth: Payer: Self-pay | Admitting: Internal Medicine

## 2022-02-14 DIAGNOSIS — G4701 Insomnia due to medical condition: Secondary | ICD-10-CM

## 2022-02-14 NOTE — Telephone Encounter (Signed)
Pt called to report that she is completely out of her current supply, hoping to receive refill today

## 2022-02-14 NOTE — Telephone Encounter (Signed)
Requested medication (s) are due for refill today - yes  Requested medication (s) are on the active medication list -yes  Future visit scheduled -yes  Last refill: 11/25/21 #30  Notes to clinic: non delegated Rx  Requested Prescriptions  Pending Prescriptions Disp Refills   zolpidem (AMBIEN) 5 MG tablet [Pharmacy Med Name: ZOLPIDEM TARTRATE 5 MG TAB] 30 tablet     Sig: TAKE 1 TABLET BY MOUTH EVERY NIGHT AT BEDTIME     Not Delegated - Psychiatry:  Anxiolytics/Hypnotics Failed - 02/14/2022 10:11 AM      Failed - This refill cannot be delegated      Failed - Urine Drug Screen completed in last 360 days      Passed - Valid encounter within last 6 months    Recent Outpatient Visits           1 month ago Type 2 diabetes mellitus with hyperglycemia, with long-term current use of insulin ([place])   [place], DO   2 months ago Primary hypertension   [place], DO   2 years ago Hypertension associated with diabetes [facility])   [facility], Dionne Bucy, MD       Future Appointments             In 1 month Teodora Medici, [facility], [place]   In 2 months Carollee Leitz, MD [facility], [facility]               Requested Prescriptions  Pending Prescriptions Disp Refills   zolpidem (AMBIEN) 5 MG tablet [Pharmacy Med Name: ZOLPIDEM TARTRATE 5 MG TAB] 30 tablet     Sig: TAKE 1 TABLET BY MOUTH EVERY NIGHT AT BEDTIME     Not Delegated - Psychiatry:  Anxiolytics/Hypnotics Failed - 02/14/2022 10:11 AM      Failed - This refill cannot be delegated      Failed - Urine Drug Screen completed in last 360 days      Passed - Valid encounter within last 6 months    Recent Outpatient Visits           1 month ago Type 2 diabetes mellitus with hyperglycemia, with long-term current use of insulin [facility])   [facility] Teodora Medici,  DO   2 months ago Primary hypertension   [place], DO   2 years ago Hypertension associated with diabetes [facility])   [facility], Dionne Bucy, MD       Future Appointments             In 1 month Teodora Medici, [facility], [place]   In 2 months Carollee Leitz, MD [facility], [facility]

## 2022-02-15 NOTE — Telephone Encounter (Signed)
Pt stated at her last appointment with PCP Rosana Berger that she advised her she wouldn't take her medication from her and would refill.  Pt stated she showed Dr.Andrews a picture of the swelling in her back.   Please advise.

## 2022-02-16 NOTE — Telephone Encounter (Signed)
Pt called and notified, we have never filled nor talked about this medication.  She was also told at first visit and several after we do not fill controlled meds.

## 2022-03-05 ENCOUNTER — Other Ambulatory Visit: Payer: Self-pay | Admitting: Internal Medicine

## 2022-03-05 ENCOUNTER — Ambulatory Visit: Payer: Self-pay

## 2022-03-05 DIAGNOSIS — Z794 Long term (current) use of insulin: Secondary | ICD-10-CM

## 2022-03-05 MED ORDER — BASAGLAR KWIKPEN 100 UNIT/ML ~~LOC~~ SOPN: 8.0000 [IU] | PEN_INJECTOR | Freq: Two times a day (BID) | SUBCUTANEOUS | 2 refills | Status: DC

## 2022-03-05 NOTE — Telephone Encounter (Signed)
Chief Complaint: Which med is the patient to be on basaglar or levemir? Additional Notes: [employer] and spoke to [place], [place] about the patient's basaglar and levemir. She says they want to know if the patient is to be on both or one of the meds, because they received basaglar today.. Advised per notes the Levemir is not covered under the patient's insurance, so basaglar was prescribed. She verbalized understanding.    Summary: Pharmacy Rx clarification.   Maddison from [place] stated clarification is needed regarding the medication Insulin Glargine (BASAGLAR KWIKPEN) 100 UNIT/ML. Maddison asked if this would be replacing Levemir or if pt would be using both.  Pharmacy seeking clinical advice.     Reason for Disposition  Pharmacy calling with prescription question and triager answers question  Answer Assessment - Initial Assessment Questions 1. NAME of MEDICINE: "What medicine(s) are you calling about?"     Basaglar and Levemir 2. QUESTION: "What is your question?" (e.g., double dose of medicine, side effect)     Which is the patient supposed to be on 3. PRESCRIBER: "Who prescribed the medicine?" Reason: if prescribed by specialist, call should be referred to that group.     Dr. Rosana Berger  Protocols used: Medication Question Call-A-AH

## 2022-03-06 ENCOUNTER — Other Ambulatory Visit: Payer: Self-pay | Admitting: Internal Medicine

## 2022-03-06 DIAGNOSIS — G43901 Migraine, unspecified, not intractable, with status migrainosus: Secondary | ICD-10-CM

## 2022-03-06 DIAGNOSIS — G8929 Other chronic pain: Secondary | ICD-10-CM

## 2022-03-06 DIAGNOSIS — G629 Polyneuropathy, unspecified: Secondary | ICD-10-CM

## 2022-03-07 NOTE — Telephone Encounter (Signed)
Requested medication (s) are due for refill today:   Several are historical, several from 2021 out of the 8 Rx  Requested medication (s) are on the active medication list:   Yes from 2021 and as historical  Future visit scheduled:   Yes 04/05/2022   Appt. Also noted with Dr. Volanda Napoleon with [facility] in March   Last ordered: Cymbalta 01/18/2022 #30, 1 refill;   Topamax 12/08/2021 #90, 2 refils;   Jardiance is historical;   Flonase 07/21/2019 16 g, 11 refills;   Spiriva inhaler 10/27/2019 #30, 11 refills another provider;   Glipizide 10/27/2019 #60, 3 refills;   Metoprolol;   Tradjenta historical.  Returned for provider review.      Requested Prescriptions  Pending Prescriptions Disp Refills   DULoxetine (CYMBALTA) 20 MG capsule [Pharmacy Med Name: DULOXETINE HCL 20 MG CAP] 30 capsule 1    Sig: TAKE 1 CAPSULE BY MOUTH DAILY     Psychiatry: Antidepressants - SNRI - duloxetine Failed - 03/06/2022  1:56 PM      Failed - Cr in normal range and within 360 days    Creat  Date Value Ref Range Status  02/10/2018 0.83 mg/dL Final    Comment:    . Age and/or gender not provided. Unable to calculate eGFR. Marland Kitchen           Reference Range           Female:   0.70-1.18           Female: 0.60-0.93           Unable to flag appropriately           due to gender not provided. . For patients >49 years of age, the reference limit for Creatinine is approximately 13% higher for people identified as African-American. .    Creatinine, Ser  Date Value Ref Range Status  08/23/2021 1.01 (H) 0.44 - 1.00 mg/dL Final   Creatinine, POC  Date Value Ref Range Status  05/07/2017 50 (4.4) mg/dL Final   Creatinine,U  Date Value Ref Range Status  03/17/2019 141.0 mg/dL Final   Creatinine, Urine  Date Value Ref Range Status  01/04/2022 69 20 - 275 mg/dL Final         Passed - eGFR is 30 or above and within 360 days    GFR calc Af Amer  Date Value Ref Range Status  10/27/2019 78 >59  mL/min/1.73 Final    Comment:    **[facility] currently reports eGFR in compliance with the current**   recommendations of the [employer]. [facility] will   update reporting as new guidelines are published from the NKF-ASN   Task force.    GFR, Estimated  Date Value Ref Range Status  08/23/2021 58 (L) >60 mL/min Final    Comment:    (NOTE) Calculated using the CKD-EPI Creatinine Equation (2021)    GFR  Date Value Ref Range Status  03/17/2019 72.10 >60.00 mL/min Final         Passed - Completed PHQ-2 or PHQ-9 in the last 360 days      Passed - Last BP in normal range    BP Readings from Last 1 Encounters:  01/04/22 132/68         Passed - Valid encounter within last 6 months    Recent Outpatient Visits           2 months ago Type 2 diabetes mellitus with hyperglycemia, with long-term current use  of insulin [facility])   [facility] Teodora Medici, DO   3 months ago Primary hypertension   [facility] Teodora Medici, DO   2 years ago Hypertension associated with diabetes [facility])   [place] Brita Romp, Dionne Bucy, MD       Future Appointments             In 4 weeks Teodora Medici, [facility], [place] [facility]   In 1 month Carollee Leitz, MD [place] at [facility], [place]             topiramate (TOPAMAX) 100 MG tablet [Pharmacy Med Name: TOPIRAMATE 100 MG TAB] 90 tablet 2    Sig: TAKE 3 TABLETS BY MOUTH DAILY     Neurology: Anticonvulsants - topiramate & zonisamide Failed - 03/06/2022  1:56 PM      Failed - Cr in normal range and within 360 days    Creat  Date Value Ref Range Status  02/10/2018 0.83 mg/dL Final    Comment:    . Age and/or gender not provided. Unable to calculate eGFR. Marland Kitchen           Reference Range           Female:   0.70-1.18           Female: 0.60-0.93           Unable to flag appropriately            due to gender not provided. . For patients >19 years of age, the reference limit for Creatinine is approximately 13% higher for people identified as African-American. .    Creatinine, Ser  Date Value Ref Range Status  08/23/2021 1.01 (H) 0.44 - 1.00 mg/dL Final   Creatinine, POC  Date Value Ref Range Status  05/07/2017 50 (4.4) mg/dL Final   Creatinine,U  Date Value Ref Range Status  03/17/2019 141.0 mg/dL Final   Creatinine, Urine  Date Value Ref Range Status  01/04/2022 69 20 - 275 mg/dL Final         Failed - ALT in normal range and within 360 days    ALT  Date Value Ref Range Status  01/29/2021 22 0 - 44 U/L Final         Failed - AST in normal range and within 360 days    AST  Date Value Ref Range Status  01/29/2021 19 15 - 41 U/L Final         Passed - CO2 in normal range and within 360 days    CO2  Date Value Ref Range Status  08/23/2021 24 22 - 32 mmol/L Final   Bicarbonate  Date Value Ref Range Status  01/20/2021 19.7 (L) 20.0 - 28.0 mmol/L Final         Passed - Completed PHQ-2 or PHQ-9 in the last 360 days      Passed - Valid encounter within last 12 months    Recent Outpatient Visits           2 months ago Type 2 diabetes mellitus with hyperglycemia, with long-term current use of insulin [facility])   [place], DO   3 months ago Primary hypertension   [place], DO   2 years ago Hypertension associated with diabetes [facility])   [place] [facility] Nikolai, Dionne Bucy, MD  Future Appointments             In 4 weeks Teodora Medici, [facility], [facility]   In 1 month Carollee Leitz, MD [place] at [facility], [place] 25 MG TABS tablet [Pharmacy Med Name: JARDIANCE 25 MG TAB] 90 tablet     Sig: TAKE 1 TABLET BY MOUTH ONCE DAILY     Endocrinology:   Diabetes - SGLT2 Inhibitors Failed - 03/06/2022  1:56 PM      Failed - Cr in normal range and within 360 days    Creat  Date Value Ref Range Status  02/10/2018 0.83 mg/dL Final    Comment:    . Age and/or gender not provided. Unable to calculate eGFR. Marland Kitchen           Reference Range           Female:   0.70-1.18           Female: 0.60-0.93           Unable to flag appropriately           due to gender not provided. . For patients >75 years of age, the reference limit for Creatinine is approximately 13% higher for people identified as African-American. .    Creatinine, Ser  Date Value Ref Range Status  08/23/2021 1.01 (H) 0.44 - 1.00 mg/dL Final   Creatinine, POC  Date Value Ref Range Status  05/07/2017 50 (4.4) mg/dL Final   Creatinine,U  Date Value Ref Range Status  03/17/2019 141.0 mg/dL Final   Creatinine, Urine  Date Value Ref Range Status  01/04/2022 69 20 - 275 mg/dL Final         Failed - HBA1C is between 0 and 7.9 and within 180 days    Hemoglobin A1C  Date Value Ref Range Status  01/04/2022 9.4 (A) 4.0 - 5.6 % Final  09/22/2021 9.2  Final         Failed - eGFR in normal range and within 360 days    GFR calc Af Amer  Date Value Ref Range Status  10/27/2019 78 >59 mL/min/1.73 Final    Comment:    **[facility] currently reports eGFR in compliance with the current**   recommendations of the [employer]. [facility] will   update reporting as new guidelines are published from the NKF-ASN   Task force.    GFR, Estimated  Date Value Ref Range Status  08/23/2021 58 (L) >60 mL/min Final    Comment:    (NOTE) Calculated using the CKD-EPI Creatinine Equation (2021)    GFR  Date Value Ref Range Status  03/17/2019 72.10 >60.00 mL/min Final         Passed - Valid encounter within last 6 months    Recent Outpatient Visits           2 months ago Type 2 diabetes mellitus with hyperglycemia, with long-term current use of insulin [facility])   [place], DO   3 months ago Primary hypertension   [facility] Teodora Medic[facility], DO   2 years ago Hypertension associated with diabetes [facility])   [place] Bacigalupo, Dionne Bucy, MD       Future Appointments             In 4 weeks Teodora Medici, [place]   In 1 month Carollee Leitz, MD [place] at [employer], [facility]             fluticasone [facility]) 50 MCG/ACT nasal spray [Pharmacy Med Name: FLUTICASONE PROPIONATE 50 MCG/ACT N] 16 g 11    Sig: PLACE 2 SPRAYS INTO BOTH NOSTRILS ONCE DAILY     Ear, Nose, and Throat: Nasal Preparations - Corticosteroids Passed - 03/06/2022  1:56 PM      Passed - Valid encounter within last 12 months    Recent Outpatient Visits           2 months ago Type 2 diabetes mellitus with hyperglycemia, with long-term current use of insulin [facility])   [facility] Teodora Medic[facility], DO   3 months ago Primary hypertension   [place], DO   2 years ago Hypertension associated with diabetes [facility])   [place] Bacigalupo, Dionne Bucy, MD       Future Appointments             In 4 weeks Teodora Medici, [facility], [place]   In 1 month Carollee Leitz, MD [place] at [facility], [place] 18 MCG inhalation capsule [Pharmacy Med Name: SPIRIVA HANDIHALER 18 MCG [place] 30 capsule 11    Sig: PLACE [place]     Pulmonology:  Anticholinergic Agents Passed - 03/06/2022  1:56 PM      Passed - Valid encounter within last 12 months    Recent Outpatient Visits           2 months ago Type 2 diabetes mellitus with hyperglycemia, with long-term current use of insulin [facility])   [facility] Teod[facility] Medic[facility], DO   3 months ago Primary hypertension   [place], DO   2 years ago Hypertension associated with diabetes [facility])   [place] Bacigalupo, Dionne Bucy, MD       Future Appointments             In 4 weeks Teodora Medici, [facility], [place]   In 1 month Carollee Leitz, MD [place] at [facility], [facility]             glipiZIDE (GLUCOTROL) 10 MG tablet [Pharmacy Med Name: GLIPIZIDE 10 MG TAB] 180 tablet     Sig: TAKE 1 TABLET BY MOUTH TWICE A DAY BEFORE MEALS     Endocrinology:  Diabetes - Sulfonylureas Failed - 03/06/2022  1:56 PM      Failed - HBA1C is between 0 and 7.9 and within 180 days    Hemoglobin A1C  Date Value Ref Range Status  01/04/2022 9.4 (A) 4.0 - 5.6 % Final  09/22/2021 9.2  Final         Failed - Cr in normal range and within 360 days    Creat  Date Value Ref Range Status  02/10/2018 0.83 mg/dL Final    Comment:    . Age and/or gender not provided. Unable to calculate eGFR. Marland Kitchen           Reference Range           Female:   0.70-1.18  Female: 0.60-0.93           Unable to flag appropriately           due to gender not provided. . For patients >58 years of age, the reference limit for Creatinine is approximately 13% higher for people identified as African-American. .    Creatinine, Ser  Date Value Ref Range Status  08/23/2021 1.01 (H) 0.44 - 1.00 mg/dL Final   Creatinine, POC  Date Value Ref Range Status  05/07/2017 50 (4.4) mg/dL Final   Creatinine,U  Date Value Ref Range Status  03/17/2019 141.0 mg/dL Final   Creatinine, Urine  Date Value Ref Range Status  01/04/2022 69 20 - 275 mg/dL Final         Passed - Valid encounter within last 6 months    Recent Outpatient Visits           2 months ago Type 2 diabetes mellitus with hyperglycemia, with long-term  current use of insulin [facility])   [facility] Teodora Medic[facility], DO   3 months ago Primary hypertension   [place], DO   2 years ago Hypertension associated with diabetes [facility])   [place] Bacigalupo, Dionne Bucy, MD       Future Appointments             In 4 weeks Teodora Medici, [facility], [place]   In 1 month Carollee Leitz, MD [place] at [facility], [place]             metoprolol tartrate (LOPRESSOR) 25 MG tablet [Pharmacy Med Name: METOPROLOL TARTRATE 25 MG TAB] 60 tablet 0    Sig: TAKE 1 TABLET BY MOUTH TWICE A DAY     Cardiovascular:  Beta Blockers Passed - 03/06/2022  1:56 PM      Passed - Last BP in normal range    BP Readings from Last 1 Encounters:  01/04/22 132/68         Passed - Last Heart Rate in normal range    Pulse Readings from Last 1 Encounters:  01/04/22 86         Passed - Valid encounter within last 6 months    Recent Outpatient Visits           2 months ago Type 2 diabetes mellitus with hyperglycemia, with long-term current use of insulin [facility])   [facility] Te[facility], DO   3 months ago Primary hypertension   [facility], DO   2 years ago Hypertension associated with diabetes [facility])   [place] Bacigalupo, Dionne Bucy, MD       Future Appointments             In 4 weeks Teodora Medici, [facility], [place]   In 1 month Carollee Leitz, MD [place] at [facility], [place] 5 MG TABS tablet [Pharmacy Med Name: TRADJENTA 5 MG TAB] 30 tablet     Sig: TAKE 1 TABLET BY MOUTH ONCE DAILY     Endocrinology:  Diabetes - DPP-4 Inhibitors - linagliptin Failed - 03/06/2022  1:56 PM      Failed - HBA1C is between 0  and 7.9 and within 180 days    Hemoglobin A1C  Date Value Ref  Range Status  01/04/2022 9.4 (A) 4.0 - 5.6 % Final  09/22/2021 9.2  Final         Passed - Valid encounter within last 6 months    Recent Outpatient Visits           2 months ago Type 2 diabetes mellitus with hyperglycemia, with long-term current use of insulin [facility])   [place], DO   3 months ago Primary hypertension   [facility] Teod[facility] Medic[facility], DO   2 years ago Hypertension associated with diabetes [facility])   [place] Bacigalupo, Dionne Bucy, MD       Future Appointments             In 4 weeks Teodora Medici, [facility], [facility]   In 1 month Carollee Leitz, MD [facility] [place], [facility]

## 2022-03-12 ENCOUNTER — Other Ambulatory Visit: Payer: Self-pay | Admitting: Internal Medicine

## 2022-03-13 NOTE — Telephone Encounter (Signed)
Requested medication (s) are due for refill today: yes  Requested medication (s) are on the active medication list: yes  Last refill:  10/27/19  Future visit scheduled: yes  Notes to clinic:  Unable to refill per protocol due to failed labs, no updated results.    Requested Prescriptions  Pending Prescriptions Disp Refills   levothyroxine (SYNTHROID) 75 MCG tablet [Pharmacy Med Name: LEVOTHYROXINE SODIUM 75 MCG TAB] 90 tablet 1    Sig: TAKE 1 TABLET BY MOUTH ONCE A DAY. TAKE ON AN EMPTY STOMACH WITH A GLASS OF WATER ATLEAST 30-60 MIN BEFORE BREAKFAST     Endocrinology:  Hypothyroid Agents Failed - 03/12/2022 12:23 PM      Failed - TSH in normal range and within 360 days    TSH  Date Value Ref Range Status  01/26/2021 5.557 (H) 0.350 - 4.500 uIU/mL Final    Comment:    Performed by a 3rd Generation assay with a functional sensitivity of <=0.01 uIU/mL. Performed at [facility], [place] [address], [place],  [address]   10/27/2019 4.500 0.450 - 4.500 uIU/mL Final         Passed - Valid encounter within last 12 months    Recent Outpatient Visits           2 months ago Type 2 diabetes mellitus with hyperglycemia, with long-term current use of insulin [facility])   [facility] Te[facility], DO   3 months ago Primary hypertension   [facility], DO   2 years ago Hypertension associated with diabetes [facility])   [place] Bacigalupo, Dionne Bucy, MD       Future Appointments             In 3 weeks Teodora Medici, [facility], [facility]   In 1 month Carollee Leitz, MD [facility] at [place], [facility]

## 2022-04-05 ENCOUNTER — Ambulatory Visit: Payer: Medicare Other | Admitting: Internal Medicine

## 2022-04-09 ENCOUNTER — Other Ambulatory Visit: Payer: Self-pay

## 2022-04-09 DIAGNOSIS — G4701 Insomnia due to medical condition: Secondary | ICD-10-CM

## 2022-04-09 MED ORDER — ALBUTEROL SULFATE HFA 108 (90 BASE) MCG/ACT IN AERS: INHALATION_SPRAY | RESPIRATORY_TRACT | 3 refills | Status: DC

## 2022-04-09 MED ORDER — DICLOFENAC EPOLAMINE 1.3 % EX PTCH: MEDICATED_PATCH | CUTANEOUS | 1 refills | Status: AC

## 2022-04-09 MED ORDER — SPIRONOLACTONE 50 MG PO TABS: ORAL_TABLET | ORAL | 1 refills | Status: DC

## 2022-04-16 ENCOUNTER — Ambulatory Visit: Payer: Medicare Other | Admitting: Family Medicine

## 2022-04-17 ENCOUNTER — Encounter: Payer: Self-pay | Admitting: Family Medicine

## 2022-04-17 ENCOUNTER — Ambulatory Visit (INDEPENDENT_AMBULATORY_CARE_PROVIDER_SITE_OTHER): Payer: Medicare Other | Admitting: Family Medicine

## 2022-04-17 ENCOUNTER — Ambulatory Visit: Payer: Self-pay | Admitting: *Deleted

## 2022-04-17 VITALS — BP 138/70 | HR 96 | Temp 98.2°F | Resp 16 | Ht 65.0 in | Wt 192.0 lb

## 2022-04-17 DIAGNOSIS — R21 Rash and other nonspecific skin eruption: Secondary | ICD-10-CM

## 2022-04-17 DIAGNOSIS — L299 Pruritus, unspecified: Secondary | ICD-10-CM | POA: Diagnosis not present

## 2022-04-17 MED ORDER — TRIAMCINOLONE ACETONIDE 0.1 % EX OINT: 1.0000 | TOPICAL_OINTMENT | Freq: Two times a day (BID) | CUTANEOUS | 0 refills | Status: AC

## 2022-04-17 MED ORDER — FAMOTIDINE 20 MG PO TABS: 20.0000 mg | ORAL_TABLET | Freq: Two times a day (BID) | ORAL | 1 refills | Status: DC

## 2022-04-17 MED ORDER — HYDROXYZINE PAMOATE 25 MG PO CAPS: 25.0000 mg | ORAL_CAPSULE | Freq: Three times a day (TID) | ORAL | 0 refills | Status: AC | PRN

## 2022-04-17 NOTE — Patient Instructions (Addendum)
Take a 2nd generation antihistamine twice a day for a few week and also add and take pepcid twice a day for a few weeks  Try hydroxyzine as prescribed INSTEAD of benadryl to see if it helps with your severe itching  You can try the steroid ointment - but I am not sure how much that will help you  Follow up in 2-3 weeks if the rash is not gone  Take your cymbalta every other day for a few more days and then stop the cymbalta for about 2 weeks to see if this is possibly causing your rash

## 2022-04-17 NOTE — Progress Notes (Signed)
Patient ID: MICHAILA LORTZ, female    DOB: 10-08-1945, 77 y.o.   MRN: [id number]  PCP: Teodora Medici, DO  Chief Complaint  Patient presents with   itching    severe itching red pin point spots to abdomen from one side to the other. Using "brush" to scratch self. Thinks since taking cymbalta itching started. Stopped taking cymbalta last night     Subjective:   ZAWADI BAILIN is a 77 y.o. female, presents to clinic with CC of the following:  HPI  Pt presents for rash started to left upper abd and has kinda spread across and down, onset about 2 weeks ago She's tried topical "antiitch" creams, tried pills, and everything that she has in her medicine cabinet or bathroom.  She reports taking benadryl constantly - usually 2-4 d a week and daily is taking allegra  Hx of severe seasonal/environmental allergies, she's tried topical eczema meds, vaseline, nothing has helped She is scratched so vigorously that she has caused some bruising in her skin and she is concerned that her abdomen is swelling She has not had any rash or swelling to her face mouth lips or no sensation of airway closure no wheeze She denies any recent changes to any soaps detergents body washes lotions Denies any recent med changes except for restarting Cymbalta about 6 weeks ago -she has been prescribed it several times in the past but she does not know that she ever took it and she is worried that might be causing itching/rash     Patient Active Problem List   Diagnosis Date Noted   Acute systolic CHF (congestive heart failure) ([place]) 01/27/2021   Demand ischemia 01/26/2021   Elevated troponin 01/26/2021   Acute pyonephrosis 01/20/2021   Severe sepsis ([place]) 01/20/2021   Hydronephrosis of right kidney 01/20/2021   Hydroureter on right 01/20/2021   Rhabdomyolysis 01/20/2021   Diarrhea 10/27/2019   Threatening behavior 08/27/2019   Non-compliance 08/27/2019   Insomnia due to medical condition 07/16/2019    Otalgia of both ears 03/17/2019   Encounter for long-term (current) use of high-risk medication 05/07/2017   Back pain 04/27/2017   Opioid abuse, daily use ([place]) 10/03/2016   OA (osteoarthritis) 07/11/2015   Cervical disc disorder with radiculopathy of cervical region [id number]   Family conflict [id number]   Chronic headaches 04/03/2012   Hypothyroidism 09/18/2010   Vitamin D deficiency 06/14/2010   ECHOCARDIOGRAM, ABNORMAL 03/16/2010   MYOCARDIAL INFARCTION, HX OF 02/09/2010   LEFT BUNDLE BRANCH BLOCK 12/26/2009   Gastroesophageal reflux disease without esophagitis 12/26/2009   COPD (chronic obstructive pulmonary disease) ([place]) 11/10/2009   Diabetes mellitus type 2 with complications ([place]) [id number]   HLD (hyperlipidemia) 03/12/2006   GOUT 03/12/2006   Hereditary and idiopathic peripheral neuropathy 03/12/2006   Essential hypertension 03/12/2006   Allergic rhinitis 03/12/2006      Current Outpatient Medications:    ACCU-CHEK FASTCLIX LANCETS MISC, 1 Device by Does not apply route 3 (three) times daily. Use to check blood sugar up to three times a day, Disp: 102 each, Rfl: 12   albuterol (VENTOLIN HFA) 108 (90 Base) MCG/ACT inhaler, INHALE 2 INHALATIONS INTO THE LUNGS EVERY 4 HOURS AS NEEDED FOR WHEEZING, Disp: 18 g, Rfl: 3   alum & mag hydroxide-simeth (MAALOX/MYLANTA) 200-200-20 MG/5ML suspension, Take 30 mLs by mouth 2 (two) times daily., Disp: , Rfl:    chlorpheniramine (CHLOR-TRIMETON) 4 MG tablet, Take 4 mg by mouth 2 (two) times daily as needed for allergies., Disp: ,  Rfl:    diclofenac (FLECTOR) 1.3 % PTCH, APPLY PATCH TO THE MOST PAINFUL AREA ONCE OR TWICE DAILY, Disp: 30 patch, Rfl: 1   diclofenac sodium (VOLTAREN) 1 % GEL, APPLY 2 GRAMS TOPICALLY 4 TIMES DAILY, Disp: 100 g, Rfl: PRN   diphenoxylate-atropine (LOMOTIL) 2.5-0.025 MG tablet, Take 2 tablets by mouth in the morning, at noon, and at bedtime. Take with imodium, Disp: , Rfl:    empagliflozin (JARDIANCE) 25 MG  TABS tablet, TAKE 1 TABLET BY MOUTH ONCE DAILY, Disp: 90 tablet, Rfl: 0   esomeprazole (NEXIUM) 40 MG capsule, TAKE 1 CAPSULE BY MOUTH ONCE DAILY, Disp: 30 capsule, Rfl: 0   fluticasone (FLONASE) 50 MCG/ACT nasal spray, PLACE 2 SPRAYS INTO BOTH NOSTRILS ONCE DAILY, Disp: 16 g, Rfl: 3   glipiZIDE (GLUCOTROL) 10 MG tablet, TAKE 1 TABLET BY MOUTH TWICE A DAY BEFORE MEALS, Disp: 180 tablet, Rfl: 0   Insulin Glargine (BASAGLAR KWIKPEN) 100 UNIT/ML, Inject 8 Units into the skin 2 (two) times daily., Disp: 4.8 mL, Rfl: 2   levothyroxine (SYNTHROID) 75 MCG tablet, TAKE 1 TABLET BY MOUTH ONCE A DAY. TAKE ON AN EMPTY STOMACH WITH A GLASS OF WATER ATLEAST 30-60 MIN BEFORE BREAKFAST, Disp: 90 tablet, Rfl: 1   linagliptin (TRADJENTA) 5 MG TABS tablet, TAKE 1 TABLET BY MOUTH ONCE DAILY, Disp: 90 tablet, Rfl: 0   loperamide (IMODIUM) 2 MG capsule, Take 8 mg by mouth 2 (two) times daily. Take with lomotil, Disp: , Rfl:    losartan (COZAAR) 50 MG tablet, Take 50 mg by mouth daily., Disp: , Rfl:    lovastatin (MEVACOR) 40 MG tablet, Take 1 tablet (40 mg total) by mouth 2 (two) times daily., Disp: 180 tablet, Rfl: 2   metoprolol tartrate (LOPRESSOR) 25 MG tablet, TAKE 1 TABLET BY MOUTH TWICE A DAY, Disp: 60 tablet, Rfl: 2   NIFEdipine (ADALAT CC) 30 MG 24 hr tablet, Take 1 tablet (30 mg total) by mouth daily., Disp: 30 tablet, Rfl: 0   nitroGLYCERIN (NITROSTAT) 0.4 MG SL tablet, Take SL prn chest pain/May repeat 5 min apart up to 3 times prn, Disp: 25 tablet, Rfl: 5   nystatin cream (MYCOSTATIN), APPLY TO AFFECTED AREAS ONCE DAILY AS NEEDED, Disp: 30 g, Rfl: PRN   pregabalin (LYRICA) 25 MG capsule, Take 1 capsule (25 mg total) by mouth 2 (two) times daily., Disp: 60 capsule, Rfl: 0   spironolactone (ALDACTONE) 50 MG tablet, TAKE 1 TABLET BY MOUTH ONCE DAILY AS NEEDED FOR SWELLING OF LEGS, Disp: 30 tablet, Rfl: 1   tiotropium (SPIRIVA HANDIHALER) 18 MCG inhalation capsule, PLACE 1 CAPSULE INTO INHALER AND INHALE ONCE  DAILY, Disp: 30 capsule, Rfl: 1   tiZANidine (ZANAFLEX) 4 MG tablet, TAKE 1 TABLET BY MOUTH 2 TIMES DAILY AS NEEDED, Disp: 30 tablet, Rfl: 0   topiramate (TOPAMAX) 100 MG tablet, Take 2 tablets (200 mg total) by mouth daily., Disp: 90 tablet, Rfl: 1   zolpidem (AMBIEN) 5 MG tablet, Take 1 tablet (5 mg total) by mouth at bedtime as needed. for sleep, Disp: 30 tablet, Rfl: 0   DULoxetine (CYMBALTA) 20 MG capsule, TAKE 1 CAPSULE BY MOUTH DAILY (Patient not taking: Reported on 04/17/2022), Disp: 30 capsule, Rfl: 1   Allergies  Allergen Reactions   Azithromycin Diarrhea   Levofloxacin Other (See Comments)    Made head feel weird   Nortriptyline Other (See Comments)    Caused rectal bleeding    Psyllium Other (See Comments)   Tape Other (See  Comments)    Breaks out with Medical Tape      Social History   Tobacco Use   Smoking status: Former    Packs/day: 2.50    Years: 10.00    Total pack years: 25.00    Types: Cigarettes   Smokeless tobacco: Never  Vaping Use   Vaping Use: Never used  Substance Use Topics   Alcohol use: No   Drug use: No      Chart Review Today: I personally reviewed active problem list, medication list, allergies, family history, social history, health maintenance, notes from last encounter, lab results, imaging with the patient/caregiver today.   Review of Systems  Constitutional: Negative.  Negative for activity change, appetite change, chills, diaphoresis, fatigue, fever and unexpected weight change.  HENT: Negative.  Negative for sore throat, trouble swallowing and voice change.   Eyes: Negative.   Respiratory: Negative.  Negative for chest tightness, shortness of breath and wheezing.   Cardiovascular: Negative.   Gastrointestinal: Negative.   Endocrine: Negative.   Genitourinary: Negative.   Musculoskeletal: Negative.   Skin:  Positive for rash.  Allergic/Immunologic: Negative.   Neurological: Negative.   Hematological: Negative.    Psychiatric/Behavioral: Negative.    All other systems reviewed and are negative.      Objective:   Vitals:   04/17/22 1519 04/17/22 1604  BP: (!) 158/82 138/70  Pulse: 96   Resp: 16   Temp: 98.2 F (36.8 C)   TempSrc: Oral   SpO2: 96%   Weight: 192 lb (87.1 kg)   Height: '5\' 5"'$  (1.651 m)     Body mass index is 31.95 kg/m.  Physical Exam Vitals and nursing note reviewed.  Constitutional:      General: She is not in acute distress.    Appearance: She is well-developed. She is obese. She is not ill-appearing, toxic-appearing or diaphoretic.  HENT:     Head: Normocephalic and atraumatic.     Nose: Nose normal. No congestion or rhinorrhea.     Mouth/Throat:     Mouth: Mucous membranes are dry.     Pharynx: No oropharyngeal exudate or posterior oropharyngeal erythema.  Eyes:     General:        Right eye: No discharge.        Left eye: No discharge.     Conjunctiva/sclera: Conjunctivae normal.  Neck:     Trachea: No tracheal deviation.  Cardiovascular:     Rate and Rhythm: Normal rate and regular rhythm.     Pulses: Normal pulses.     Heart sounds: Normal heart sounds.  Pulmonary:     Effort: Pulmonary effort is normal. No respiratory distress.     Breath sounds: Normal breath sounds. No stridor.  Abdominal:     Comments: Obese abdomen, soft, NTND, NBS x 4   Musculoskeletal:     Right lower leg: No edema.     Left lower leg: No edema.  Skin:    General: Skin is warm and dry.     Coloration: Skin is not jaundiced.     Findings: Rash (fine scattered erythematous rash across left upper abd, then down the center with associated excoriations) present. No bruising or lesion.  Neurological:     Mental Status: She is alert.     Motor: No abnormal muscle tone.     Coordination: Coordination normal.  Psychiatric:        Attention and Perception: She is inattentive.        Speech:  Speech is tangential.        Behavior: Behavior normal.      Results for orders  placed or performed in visit on 01/04/22  Urine Microalbumin w/creat. ratio  Result Value Ref Range   Creatinine, Urine 69 20 - 275 mg/dL   Microalb, Ur 0.4 mg/dL   Microalb Creat Ratio 6 <30 mcg/mg creat  POCT HgB A1C  Result Value Ref Range   Hemoglobin A1C 9.4 (A) 4.0 - 5.6 %   HbA1c POC (<> result, manual entry)     HbA1c, POC (prediabetic range)     HbA1c, POC (controlled diabetic range)         Assessment & Plan:   New rash/pruritic condition to upper abd onset about 2 weeks ago. She denies any new lotions soaps detergents body wash etc. She did restart Cymbalta 20 mg -this was prescribed for her at the very beginning of February so roughly 5 to 6 weeks ago, and she has taken it in the past-she is concerned this may be causing the rash She held her dose last night She reports trying everything that she has in her home -topically treating her rash without improvement she cannot tell me active ingredients except for a few possibly some anti-itch cream some nystatin, she is taking oral Allegra regularly and Benadryl several times a week without improvement.  She is also tried hydration with Vaseline without any change  There are some very fine erythematous papules across her left upper abdomen and then down her central abdomen with associated excoriations they are not crusted or plaque-like, no wheals or visible hives.  With the history and the exam I doubt that these are spreading and more appears that her vigorous scratching is causing some of the redness with some scant petechia/bruising She is concerned about a swollen abdomen however she is a soft obese abdomen that is nontender nondistended, she has no swelling anywhere else in her body  For now the plan will be to wean off Cymbalta (since she is worried about withdraw) she has not been on a high dose for very long I explained to her that for the next several days to weeks she can do every other day dosing with her 20 mg capsules  and then DC for 2 more weeks and monitor the rash Encouraged her to use a second-generation antihistamine twice daily and add Pepcid to this for potentiating effects with the antihistamines.  She has tried Benadryl several times over the past 2 weeks and it does not seem to help her itching very much -encouraged her to try hydroxyzine in place of Benadryl  If there is no improvement then we can complete the labs when she returns  1. Rash and nonspecific skin eruption 2. Pruritic condition  - famotidine (PEPCID) 20 MG tablet; Take 1 tablet (20 mg total) by mouth 2 (two) times daily.  Dispense: 60 tablet; Refill: 1 - hydrOXYzine (VISTARIL) 25 MG capsule; Take 1 capsule (25 mg total) by mouth every 8 (eight) hours as needed.  Dispense: 30 capsule; Refill: 0  Can try scan amount of stronger steroid to most bothersome area as long as skin is intact - use sparingly - triamcinolone ointment (KENALOG) 0.1 %; Apply 1 Application topically 2 (two) times daily.  Dispense: 30 g; Refill: 0  Labs if not improving - or consider allergy testing vs dermatology  - TSH - COMPLETE METABOLIC PANEL WITH GFR - CBC with Differential/Platelet  Return for 2-4 week f/up if not  improving.     Delsa Grana, PA-C 04/17/22 3:46 PM

## 2022-04-17 NOTE — Telephone Encounter (Signed)
  Chief Complaint: severe itching abdomen Symptoms: severe itching red pin point spots to abdomen from one side to the other. Using "brush" to scratch self. Thinks since taking cymbalta itching started. Stopped taking cymbalta last night  Frequency: 1 week  Pertinent Negatives: Patient denies chest pain no difficulty breathing no fever no tongue swelling no other areas on body itching  Disposition: [] ED /[] Urgent Care (no appt availability in office) / [x] Appointment(In office/virtual)/ []  [place] [facility]/ [] Home Care/ [] Refused Recommended Disposition /[]  Mobile Bus/ []  Follow-up with PCP Additional Notes:  Appt today . Recommended if worsening or spreads call back or go to ED    Reason for Disposition  [1] MODERATE-SEVERE local itching (i.e., interferes with work, school, activities) AND [2] not improved after 24 hours of hydrocortisone cream  Answer Assessment - Initial Assessment Questions 1. DESCRIPTION: "Describe the itching you are having." "Where is it located?"     Stomach spreading across one side to the other  2. SEVERITY: "How bad is it?"    - MILD: Doesn't interfere with normal activities.   - MODERATE-SEVERE: Interferes with work, school, sleep, or other activities.      severe 3. SCRATCHING: "Are there any scratch marks? Bleeding?"     No just turns red  4. ONSET: "When did the itching begin?"      1 week  5. CAUSE: "What do you think is causing the itching?"      Cymbalta  6. OTHER SYMPTOMS: "Do you have any other symptoms?"      Itching only pinpoint dots red. Reports hard to see 7. PREGNANCY: "Is there any chance you are pregnant?" "When was your last menstrual period?"     na  Protocols used: Itching - Localized-A-AH

## 2022-04-18 ENCOUNTER — Ambulatory Visit: Payer: Self-pay

## 2022-04-18 NOTE — Telephone Encounter (Signed)
Message from [place] sent at 04/18/2022  3:15 PM EDT  Summary: cough phlegm   Coughing phlegm, says , didn't want appt, asking for more med, that she was given at [facility], but Dr wouldn't refill for her         Chief Complaint: thick "chunky" green phlegm from lungs Symptoms: fatigue, coughing episodes, occasional wheezing, Frequency: 2 weeks Pertinent Negatives: Patient denies SOB, fever Disposition: '[]'$ ED /'[]'$ Urgent Care (no appt availability in office) / '[]'$ Appointment(In office/virtual)/ '[]'$  [place] [facility]/ '[]'$ Home Care/ '[x]'$ Refused Recommended Disposition /'[]'$  Mobile Bus/ '[]'$  Follow-up with PCP Additional Notes: pt refused OV- advised will need OV- pt stated she was in office yesterday and "that stethoscope" aint going to tell you more than you know."  Reason for Disposition  [1] Known COPD or other severe lung disease (i.e., bronchiectasis, cystic fibrosis, lung surgery) AND [2] worsening symptoms (i.e., increased sputum purulence or amount, increased breathing difficulty  Answer Assessment - Initial Assessment Questions 1. ONSET: "When did the cough begin?"      2 weeks ago 2. SEVERITY: "How bad is the cough today?"      Has periodic coughing episodes 3. SPUTUM: "Describe the color of your sputum" (none, dry cough; clear, white, yellow, green)     Green, chunks 5. DIFFICULTY BREATHING: "Are you having difficulty breathing?" If Yes, ask: "How bad is it?" (e.g., mild, moderate, severe)    - MILD: No SOB at rest, mild SOB with walking, speaks normally in sentences, can lie down, no retractions, pulse < 100.    - MODERATE: SOB at rest, SOB with minimal exertion and prefers to sit, cannot lie down flat, speaks in phrases, mild retractions, audible wheezing, pulse 100-120.    - SEVERE: Very SOB at rest, speaks in single words, struggling to breathe, sitting hunched forward, retractions, pulse > 120      no 6. FEVER: "Do you have a fever?" If Yes, ask: "What is  your temperature, how was it measured, and when did it start?"     no 7. CARDIAC HISTORY: "Do you have any history of heart disease?" (e.g., heart attack, congestive heart failure)      MI 8. LUNG HISTORY: "Do you have any history of lung disease?"  (e.g., pulmonary embolus, asthma, emphysema)     COPD 9. PE RISK FACTORS: "Do you have a history of blood clots?" (or: recent major surgery, recent prolonged travel, bedridden)     no 10. OTHER SYMPTOMS: "Do you have any other symptoms?" (e.g., runny nose, wheezing, chest pain)       Abx off 6 days, wheezing  Protocols used: Cough - Acute Productive-A-AH

## 2022-05-10 ENCOUNTER — Other Ambulatory Visit: Payer: Self-pay

## 2022-05-10 MED ORDER — LOVASTATIN 40 MG PO TABS: 40.0000 mg | ORAL_TABLET | Freq: Two times a day (BID) | ORAL | 0 refills | Status: AC

## 2022-05-18 ENCOUNTER — Ambulatory Visit: Payer: Medicare Other | Admitting: Internal Medicine

## 2022-06-11 ENCOUNTER — Telehealth: Payer: Self-pay | Admitting: Internal Medicine

## 2022-06-11 NOTE — Telephone Encounter (Signed)
Called patient to schedule Medicare Annual Wellness Visit (AWV). Unable to reach patient.  Last date of AWV: 01/14/2019  Please schedule an appointment at any time with [facility].  If any questions, please contact me at [phone].  Thank you ,  Randon Goldsmith Care Guide [facility] AWV TEAM Direct Dial: [phone]

## 2022-07-05 ENCOUNTER — Inpatient Hospital Stay
Admission: EM | Admit: 2022-07-05 | Discharge: 2022-07-09 | DRG: 392 | Disposition: A | Discharge status: Home / Self Care | Payer: Medicare Other | Attending: Internal Medicine | Admitting: Internal Medicine

## 2022-07-05 ENCOUNTER — Other Ambulatory Visit: Payer: Self-pay

## 2022-07-05 ENCOUNTER — Emergency Department: Payer: Medicare Other

## 2022-07-05 DIAGNOSIS — I5032 Chronic diastolic (congestive) heart failure: Secondary | ICD-10-CM

## 2022-07-05 DIAGNOSIS — I130 Hypertensive heart and chronic kidney disease with heart failure and stage 1 through stage 4 chronic kidney disease, or unspecified chronic kidney disease: Secondary | ICD-10-CM | POA: Diagnosis present

## 2022-07-05 DIAGNOSIS — Z794 Long term (current) use of insulin: Secondary | ICD-10-CM | POA: Diagnosis not present

## 2022-07-05 DIAGNOSIS — Z881 Allergy status to other antibiotic agents status: Secondary | ICD-10-CM

## 2022-07-05 DIAGNOSIS — E039 Hypothyroidism, unspecified: Secondary | ICD-10-CM

## 2022-07-05 DIAGNOSIS — E1129 Type 2 diabetes mellitus with other diabetic kidney complication: Secondary | ICD-10-CM | POA: Diagnosis not present

## 2022-07-05 DIAGNOSIS — I10 Essential (primary) hypertension: Secondary | ICD-10-CM | POA: Diagnosis not present

## 2022-07-05 DIAGNOSIS — E669 Obesity, unspecified: Secondary | ICD-10-CM | POA: Diagnosis present

## 2022-07-05 DIAGNOSIS — K219 Gastro-esophageal reflux disease without esophagitis: Secondary | ICD-10-CM | POA: Diagnosis present

## 2022-07-05 DIAGNOSIS — E875 Hyperkalemia: Secondary | ICD-10-CM | POA: Diagnosis present

## 2022-07-05 DIAGNOSIS — Z9049 Acquired absence of other specified parts of digestive tract: Secondary | ICD-10-CM | POA: Diagnosis not present

## 2022-07-05 DIAGNOSIS — E785 Hyperlipidemia, unspecified: Secondary | ICD-10-CM | POA: Diagnosis present

## 2022-07-05 DIAGNOSIS — R112 Nausea with vomiting, unspecified: Secondary | ICD-10-CM | POA: Diagnosis present

## 2022-07-05 DIAGNOSIS — N182 Chronic kidney disease, stage 2 (mild): Secondary | ICD-10-CM | POA: Diagnosis present

## 2022-07-05 DIAGNOSIS — K529 Noninfective gastroenteritis and colitis, unspecified: Secondary | ICD-10-CM | POA: Diagnosis not present

## 2022-07-05 DIAGNOSIS — E872 Acidosis, unspecified: Secondary | ICD-10-CM | POA: Diagnosis present

## 2022-07-05 DIAGNOSIS — Z6832 Body mass index (BMI) 32.0-32.9, adult: Secondary | ICD-10-CM

## 2022-07-05 DIAGNOSIS — E1122 Type 2 diabetes mellitus with diabetic chronic kidney disease: Secondary | ICD-10-CM | POA: Diagnosis present

## 2022-07-05 DIAGNOSIS — K631 Perforation of intestine (nontraumatic): Secondary | ICD-10-CM | POA: Diagnosis not present

## 2022-07-05 DIAGNOSIS — K5720 Diverticulitis of large intestine with perforation and abscess without bleeding: Secondary | ICD-10-CM | POA: Diagnosis present

## 2022-07-05 DIAGNOSIS — Z87891 Personal history of nicotine dependence: Secondary | ICD-10-CM | POA: Diagnosis not present

## 2022-07-05 DIAGNOSIS — Z8049 Family history of malignant neoplasm of other genital organs: Secondary | ICD-10-CM | POA: Diagnosis not present

## 2022-07-05 DIAGNOSIS — Z79890 Hormone replacement therapy (postmenopausal): Secondary | ICD-10-CM

## 2022-07-05 DIAGNOSIS — Z7984 Long term (current) use of oral hypoglycemic drugs: Secondary | ICD-10-CM

## 2022-07-05 DIAGNOSIS — K668 Other specified disorders of peritoneum: Secondary | ICD-10-CM

## 2022-07-05 DIAGNOSIS — Z90710 Acquired absence of both cervix and uterus: Secondary | ICD-10-CM | POA: Diagnosis not present

## 2022-07-05 DIAGNOSIS — D696 Thrombocytopenia, unspecified: Secondary | ICD-10-CM

## 2022-07-05 DIAGNOSIS — R109 Unspecified abdominal pain: Principal | ICD-10-CM

## 2022-07-05 DIAGNOSIS — E1142 Type 2 diabetes mellitus with diabetic polyneuropathy: Secondary | ICD-10-CM | POA: Diagnosis present

## 2022-07-05 DIAGNOSIS — I2510 Atherosclerotic heart disease of native coronary artery without angina pectoris: Secondary | ICD-10-CM | POA: Diagnosis present

## 2022-07-05 DIAGNOSIS — Z888 Allergy status to other drugs, medicaments and biological substances status: Secondary | ICD-10-CM

## 2022-07-05 DIAGNOSIS — J449 Chronic obstructive pulmonary disease, unspecified: Secondary | ICD-10-CM | POA: Diagnosis present

## 2022-07-05 DIAGNOSIS — I252 Old myocardial infarction: Secondary | ICD-10-CM | POA: Diagnosis not present

## 2022-07-05 DIAGNOSIS — Z91048 Other nonmedicinal substance allergy status: Secondary | ICD-10-CM

## 2022-07-05 DIAGNOSIS — Z79899 Other long term (current) drug therapy: Secondary | ICD-10-CM

## 2022-07-05 LAB — CBC WITH DIFFERENTIAL/PLATELET
Abs Immature Granulocytes: 0.04 10*3/uL (ref 0.00–0.07)
Basophils Absolute: 0.1 10*3/uL (ref 0.0–0.1)
Basophils Relative: 1 %
Eosinophils Absolute: 0.2 10*3/uL (ref 0.0–0.5)
Eosinophils Relative: 2 %
HCT: 53.4 % — ABNORMAL HIGH (ref 36.0–46.0)
Hemoglobin: 16.7 g/dL — ABNORMAL HIGH (ref 12.0–15.0)
Immature Granulocytes: 0 %
Lymphocytes Relative: 14 %
Lymphs Abs: 1.7 10*3/uL (ref 0.7–4.0)
MCH: 27.7 pg (ref 26.0–34.0)
MCHC: 31.3 g/dL (ref 30.0–36.0)
MCV: 88.7 fL (ref 80.0–100.0)
Monocytes Absolute: 0.7 10*3/uL (ref 0.1–1.0)
Monocytes Relative: 6 %
Neutro Abs: 9.9 10*3/uL — ABNORMAL HIGH (ref 1.7–7.7)
Neutrophils Relative %: 77 %
Platelets: 198 10*3/uL (ref 150–400)
RBC: 6.02 MIL/uL — ABNORMAL HIGH (ref 3.87–5.11)
RDW: 13.8 % (ref 11.5–15.5)
WBC: 12.7 10*3/uL — ABNORMAL HIGH (ref 4.0–10.5)
nRBC: 0 % (ref 0.0–0.2)

## 2022-07-05 LAB — PROTIME-INR
INR: 1 (ref 0.8–1.2)
Prothrombin Time: 13.6 seconds (ref 11.4–15.2)

## 2022-07-05 LAB — COMPREHENSIVE METABOLIC PANEL
ALT: 36 U/L (ref 0–44)
AST: 31 U/L (ref 15–41)
Albumin: 4.6 g/dL (ref 3.5–5.0)
Alkaline Phosphatase: 137 U/L — ABNORMAL HIGH (ref 38–126)
Anion gap: 10 (ref 5–15)
BUN: 24 mg/dL — ABNORMAL HIGH (ref 8–23)
CO2: 24 mmol/L (ref 22–32)
Calcium: 9.5 mg/dL (ref 8.9–10.3)
Chloride: 103 mmol/L (ref 98–111)
Creatinine, Ser: 1.02 mg/dL — ABNORMAL HIGH (ref 0.44–1.00)
GFR, Estimated: 57 mL/min — ABNORMAL LOW (ref >60)
Glucose, Bld: 234 mg/dL — ABNORMAL HIGH (ref 70–99)
Potassium: 5.3 mmol/L — ABNORMAL HIGH (ref 3.5–5.1)
Sodium: 137 mmol/L (ref 135–145)
Total Bilirubin: 1.5 mg/dL — ABNORMAL HIGH (ref 0.3–1.2)
Total Protein: 8.6 g/dL — ABNORMAL HIGH (ref 6.5–8.1)

## 2022-07-05 LAB — TYPE AND SCREEN
ABO/RH(D): O POS
Antibody Screen: NEGATIVE

## 2022-07-05 LAB — GLUCOSE, CAPILLARY
Glucose-Capillary: 165 mg/dL — ABNORMAL HIGH (ref 70–99)
Glucose-Capillary: 162 mg/dL — ABNORMAL HIGH (ref 70–99)
Glucose-Capillary: 124 mg/dL — ABNORMAL HIGH (ref 70–99)
Glucose-Capillary: 145 mg/dL — ABNORMAL HIGH (ref 70–99)

## 2022-07-05 LAB — CBG MONITORING, ED: Glucose-Capillary: 237 mg/dL — ABNORMAL HIGH (ref 70–99)

## 2022-07-05 LAB — POTASSIUM: Potassium: 4.1 mmol/L (ref 3.5–5.1)

## 2022-07-05 LAB — BRAIN NATRIURETIC PEPTIDE: B Natriuretic Peptide: 50.3 pg/mL (ref 0.0–100.0)

## 2022-07-05 LAB — APTT: aPTT: 26 seconds (ref 24–36)

## 2022-07-05 LAB — LIPASE, BLOOD: Lipase: 26 U/L (ref 11–51)

## 2022-07-05 LAB — TROPONIN I (HIGH SENSITIVITY): Troponin I (High Sensitivity): 9 ng/L (ref <18)

## 2022-07-05 MED ORDER — PIPERACILLIN-TAZOBACTAM 3.375 G IVPB 30 MIN
3.3750 g | Freq: Once | INTRAVENOUS | Status: AC
  Administered 2022-07-05: 3.375 g via INTRAVENOUS
  Filled 2022-07-05: qty 50

## 2022-07-05 MED ORDER — MORPHINE SULFATE (PF) 4 MG/ML IV SOLN
4.0000 mg | Freq: Once | INTRAVENOUS | Status: AC
  Administered 2022-07-05: 4 mg via INTRAVENOUS
  Filled 2022-07-05: qty 1

## 2022-07-05 MED ORDER — ONDANSETRON HCL 4 MG/2ML IJ SOLN
4.0000 mg | Freq: Once | INTRAMUSCULAR | Status: AC
  Administered 2022-07-05: 4 mg via INTRAVENOUS
  Filled 2022-07-05: qty 2

## 2022-07-05 MED ORDER — TOPIRAMATE 100 MG PO TABS
200.0000 mg | ORAL_TABLET | Freq: Every day | ORAL | Status: DC
  Administered 2022-07-05 – 2022-07-06 (×2): 200 mg via ORAL
  Filled 2022-07-05: qty 2
  Filled 2022-07-05: qty 8

## 2022-07-05 MED ORDER — LEVOTHYROXINE SODIUM 50 MCG PO TABS
75.0000 ug | ORAL_TABLET | Freq: Every day | ORAL | Status: DC
  Administered 2022-07-06 – 2022-07-09 (×4): 75 ug via ORAL
  Filled 2022-07-05 (×4): qty 2

## 2022-07-05 MED ORDER — NITROGLYCERIN 0.4 MG SL SUBL: 0.4000 mg | SUBLINGUAL_TABLET | SUBLINGUAL | Status: DC | PRN

## 2022-07-05 MED ORDER — MORPHINE SULFATE (PF) 2 MG/ML IV SOLN: 2.0000 mg | INTRAVENOUS | Status: DC | PRN

## 2022-07-05 MED ORDER — FAMOTIDINE IN NACL 20-0.9 MG/50ML-% IV SOLN
20.0000 mg | Freq: Once | INTRAVENOUS | Status: AC
  Administered 2022-07-05: 20 mg via INTRAVENOUS
  Filled 2022-07-05: qty 50

## 2022-07-05 MED ORDER — METOPROLOL TARTRATE 25 MG PO TABS
25.0000 mg | ORAL_TABLET | Freq: Two times a day (BID) | ORAL | Status: DC
  Administered 2022-07-05 – 2022-07-09 (×7): 25 mg via ORAL
  Filled 2022-07-05 (×9): qty 1

## 2022-07-05 MED ORDER — SODIUM CHLORIDE 0.9 % IV SOLN: INTRAVENOUS | Status: DC

## 2022-07-05 MED ORDER — PIPERACILLIN-TAZOBACTAM 3.375 G IVPB
3.3750 g | Freq: Three times a day (TID) | INTRAVENOUS | Status: DC
  Administered 2022-07-05 – 2022-07-09 (×12): 3.375 g via INTRAVENOUS
  Filled 2022-07-05 (×12): qty 50

## 2022-07-05 MED ORDER — SODIUM CHLORIDE 0.9 % IV BOLUS
1000.0000 mL | Freq: Once | INTRAVENOUS | Status: AC
  Administered 2022-07-05: 1000 mL via INTRAVENOUS

## 2022-07-05 MED ORDER — INSULIN GLARGINE-YFGN 100 UNIT/ML ~~LOC~~ SOLN
8.0000 [IU] | Freq: Two times a day (BID) | SUBCUTANEOUS | Status: DC
  Administered 2022-07-05 – 2022-07-06 (×2): 8 [IU] via SUBCUTANEOUS
  Filled 2022-07-05 (×3): qty 0.08

## 2022-07-05 MED ORDER — TIOTROPIUM BROMIDE MONOHYDRATE 18 MCG IN CAPS
18.0000 ug | ORAL_CAPSULE | Freq: Every day | RESPIRATORY_TRACT | Status: DC
  Administered 2022-07-06 – 2022-07-07 (×2): 18 ug via RESPIRATORY_TRACT
  Filled 2022-07-05 (×2): qty 5

## 2022-07-05 MED ORDER — PANTOPRAZOLE SODIUM 40 MG IV SOLR
40.0000 mg | Freq: Once | INTRAVENOUS | Status: AC
  Administered 2022-07-05: 40 mg via INTRAVENOUS
  Filled 2022-07-05: qty 10

## 2022-07-05 MED ORDER — ONDANSETRON HCL 4 MG/2ML IJ SOLN
4.0000 mg | Freq: Three times a day (TID) | INTRAMUSCULAR | Status: DC | PRN
  Administered 2022-07-09: 4 mg via INTRAVENOUS
  Filled 2022-07-05: qty 2

## 2022-07-05 MED ORDER — ALBUTEROL SULFATE (2.5 MG/3ML) 0.083% IN NEBU: 2.5000 mg | INHALATION_SOLUTION | RESPIRATORY_TRACT | Status: DC | PRN

## 2022-07-05 MED ORDER — MORPHINE SULFATE (PF) 2 MG/ML IV SOLN: 2.0000 mg | Freq: Once | INTRAVENOUS | Status: DC

## 2022-07-05 MED ORDER — DIPHENHYDRAMINE HCL 25 MG PO CAPS: 25.0000 mg | ORAL_CAPSULE | Freq: Three times a day (TID) | ORAL | Status: DC | PRN

## 2022-07-05 MED ORDER — HYDRALAZINE HCL 20 MG/ML IJ SOLN: 5.0000 mg | INTRAMUSCULAR | Status: DC | PRN

## 2022-07-05 MED ORDER — PREGABALIN 25 MG PO CAPS
25.0000 mg | ORAL_CAPSULE | Freq: Two times a day (BID) | ORAL | Status: DC
  Administered 2022-07-05 – 2022-07-09 (×9): 25 mg via ORAL
  Filled 2022-07-05 (×9): qty 1

## 2022-07-05 MED ORDER — DULOXETINE HCL 20 MG PO CPEP
20.0000 mg | ORAL_CAPSULE | Freq: Every day | ORAL | Status: DC
  Administered 2022-07-05 – 2022-07-09 (×4): 20 mg via ORAL
  Filled 2022-07-05 (×6): qty 1

## 2022-07-05 MED ORDER — ZOLPIDEM TARTRATE 5 MG PO TABS
5.0000 mg | ORAL_TABLET | Freq: Every evening | ORAL | Status: DC | PRN
  Administered 2022-07-06 – 2022-07-08 (×3): 5 mg via ORAL
  Filled 2022-07-05 (×3): qty 1

## 2022-07-05 MED ORDER — PANTOPRAZOLE SODIUM 40 MG PO TBEC
40.0000 mg | DELAYED_RELEASE_TABLET | Freq: Every day | ORAL | Status: DC
  Administered 2022-07-06 – 2022-07-09 (×4): 40 mg via ORAL
  Filled 2022-07-05 (×4): qty 1

## 2022-07-05 MED ORDER — IOHEXOL 300 MG/ML  SOLN
100.0000 mL | Freq: Once | INTRAMUSCULAR | Status: AC | PRN
  Administered 2022-07-05: 100 mL via INTRAVENOUS

## 2022-07-05 MED ORDER — INSULIN ASPART 100 UNIT/ML IJ SOLN
0.0000 [IU] | Freq: Every day | INTRAMUSCULAR | Status: DC
  Administered 2022-07-06 – 2022-07-08 (×3): 2 [IU] via SUBCUTANEOUS
  Filled 2022-07-05 (×3): qty 1

## 2022-07-05 MED ORDER — ACETAMINOPHEN 325 MG PO TABS
650.0000 mg | ORAL_TABLET | Freq: Four times a day (QID) | ORAL | Status: DC | PRN
  Administered 2022-07-07: 650 mg via ORAL
  Filled 2022-07-05 (×2): qty 2

## 2022-07-05 MED ORDER — OXYCODONE-ACETAMINOPHEN 5-325 MG PO TABS
1.0000 | ORAL_TABLET | ORAL | Status: DC | PRN
  Administered 2022-07-06 – 2022-07-08 (×8): 1 via ORAL
  Filled 2022-07-05 (×8): qty 1

## 2022-07-05 MED ORDER — FLUTICASONE PROPIONATE 50 MCG/ACT NA SUSP
1.0000 | Freq: Every day | NASAL | Status: DC | PRN
  Administered 2022-07-08 – 2022-07-09 (×3): 1 via NASAL
  Filled 2022-07-05: qty 16

## 2022-07-05 MED ORDER — LOPERAMIDE HCL 2 MG PO CAPS: 8.0000 mg | ORAL_CAPSULE | Freq: Two times a day (BID) | ORAL | Status: DC | PRN

## 2022-07-05 MED ORDER — HYDROXYZINE HCL 25 MG PO TABS
25.0000 mg | ORAL_TABLET | Freq: Three times a day (TID) | ORAL | Status: DC | PRN
  Filled 2022-07-05: qty 1

## 2022-07-05 MED ORDER — TIZANIDINE HCL 4 MG PO TABS
4.0000 mg | ORAL_TABLET | Freq: Two times a day (BID) | ORAL | Status: DC | PRN
  Administered 2022-07-08: 4 mg via ORAL
  Filled 2022-07-05 (×2): qty 1

## 2022-07-05 MED ORDER — TRIAMCINOLONE ACETONIDE 0.1 % EX OINT
1.0000 | TOPICAL_OINTMENT | Freq: Two times a day (BID) | CUTANEOUS | Status: DC
  Administered 2022-07-05 – 2022-07-07 (×5): 1 via TOPICAL
  Filled 2022-07-05 (×2): qty 15

## 2022-07-05 MED ORDER — NIFEDIPINE ER OSMOTIC RELEASE 30 MG PO TB24
30.0000 mg | ORAL_TABLET | Freq: Every day | ORAL | Status: DC
  Administered 2022-07-05 – 2022-07-09 (×5): 30 mg via ORAL
  Filled 2022-07-05 (×6): qty 1

## 2022-07-05 MED ORDER — DM-GUAIFENESIN ER 30-600 MG PO TB12
1.0000 | ORAL_TABLET | Freq: Two times a day (BID) | ORAL | Status: DC | PRN
  Administered 2022-07-06 – 2022-07-08 (×3): 1 via ORAL
  Filled 2022-07-05 (×3): qty 1

## 2022-07-05 MED ORDER — PRAVASTATIN SODIUM 20 MG PO TABS
40.0000 mg | ORAL_TABLET | Freq: Every day | ORAL | Status: DC
  Administered 2022-07-05 – 2022-07-08 (×4): 40 mg via ORAL
  Filled 2022-07-05 (×5): qty 2

## 2022-07-05 MED ORDER — INSULIN ASPART 100 UNIT/ML IJ SOLN
0.0000 [IU] | Freq: Three times a day (TID) | INTRAMUSCULAR | Status: DC
  Administered 2022-07-05: 2 [IU] via SUBCUTANEOUS
  Administered 2022-07-05: 3 [IU] via SUBCUTANEOUS
  Administered 2022-07-05: 1 [IU] via SUBCUTANEOUS
  Administered 2022-07-06: 7 [IU] via SUBCUTANEOUS
  Administered 2022-07-06: 1 [IU] via SUBCUTANEOUS
  Administered 2022-07-07: 3 [IU] via SUBCUTANEOUS
  Administered 2022-07-07: 2 [IU] via SUBCUTANEOUS
  Administered 2022-07-07: 5 [IU] via SUBCUTANEOUS
  Administered 2022-07-08 (×2): 2 [IU] via SUBCUTANEOUS
  Administered 2022-07-08: 3 [IU] via SUBCUTANEOUS
  Administered 2022-07-09: 1 [IU] via SUBCUTANEOUS
  Administered 2022-07-09: 3 [IU] via SUBCUTANEOUS
  Filled 2022-07-05 (×14): qty 1

## 2022-07-05 NOTE — ED Provider Notes (Signed)
[facility] Provider Note    Event Date/Time   First MD Initiated Contact with Patient 07/05/22 [phone]     (approximate)   History   Abdominal Pain and Nausea   HPI  Mariah Reed is a 77 y.o. female brought to the ED via EMS from home with a chief complaint of nausea/vomiting and upper abdominal pain after eating [employer].  Received 50 mcg of fentanyl and 4 mg of Zofran by EMS prior to arrival.  Patient denies fever/chills, chest pain, shortness of breath, dysuria or diarrhea.     Past Medical History   Past Medical History:  Diagnosis Date   Allergy    Anemia    Anginal pain (HCC)    Arthritis    CHF (congestive heart failure) (HCC)    COPD (chronic obstructive pulmonary disease) (HCC)    Diabetes mellitus    GERD (gastroesophageal reflux disease)    Gout    Headache    History of kidney stones    Hyperlipidemia    Hypertension    Hypothyroidism    LBBB (left bundle branch block)    Low back pain    Myocardial infarction (HCC)    Nephrolithiasis    Peripheral neuropathy    Pneumonia    Rhabdomyolysis    a.) noted during 12/16-12/25/2022 admission for sepsis 2/2 RIGHT ureteral and RIGHT staghorn nephrolithiasis.   Sepsis (HCC) 01/20/2021   a.) sepsis 2/2 RIGHT ureteral and RIGHT staghorn nephrolithiasis.     Active Problem List   Patient Active Problem List   Diagnosis Date Noted   Acute systolic CHF (congestive heart failure) (HCC) 01/27/2021   Demand ischemia 01/26/2021   Elevated troponin 01/26/2021   Acute pyonephrosis 01/20/2021   Severe sepsis (HCC) 01/20/2021   Hydronephrosis of right kidney 01/20/2021   Hydroureter on right 01/20/2021   Rhabdomyolysis 01/20/2021   Diarrhea 10/27/2019   Threatening behavior 08/27/2019   Non-compliance 08/27/2019   Insomnia due to medical condition 07/16/2019   Otalgia of both ears 03/17/2019   Encounter for long-term (current) use of high-risk medication 05/07/2017   Back pain  04/27/2017   Opioid abuse, daily use (HCC) 10/03/2016   OA (osteoarthritis) 07/11/2015   Cervical disc disorder with radiculopathy of cervical region 11/09/2013   Family conflict 09/09/2012   Chronic headaches 04/03/2012   Hypothyroidism 09/18/2010   Vitamin D deficiency 06/14/2010   ECHOCARDIOGRAM, ABNORMAL 03/16/2010   MYOCARDIAL INFARCTION, HX OF 02/09/2010   LEFT BUNDLE BRANCH BLOCK 12/26/2009   Gastroesophageal reflux disease without esophagitis 12/26/2009   COPD (chronic obstructive pulmonary disease) (HCC) 11/10/2009   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 (possibly 1997)   COLONOSCOPY     CYSTOSCOPY WITH RETROGRADE PYELOGRAM, URETEROSCOPY AND STENT PLACEMENT Right 01/20/2021   Procedure: CYSTOSCOPY WITH RETROGRADE PYELOGRAM, URETEROSCOPY AND STENT PLACEMENT;  Surgeon: Jannifer Hick, MD;  Location: [facility];  Service: Urology;  Laterality: Right;   CYSTOSCOPY/URETEROSCOPY/HOLMIUM LASER/STENT PLACEMENT Right 03/14/2021   Procedure: CYSTOSCOPY/URETEROSCOPY/HOLMIUM LASER/STENT PLACEMENT;  Surgeon: Riki Altes, MD;  Location: [facility];  Service: Urology;  Laterality: Right;  CYSTOSCOPY/URETEROSCOPY/HOLMIUM LASER/STENT PLACEMENT Right 05/16/2021   Procedure: CYSTOSCOPY/URETEROSCOPY/HOLMIUM LASER/STENT REMOVAL VS EXCHANGE;  Surgeon: Riki Altes, MD;  Location: [facility];  Service: Urology;  Laterality: Right;   POLYPECTOMY     Colon   TUBAL LIGATION       Home Medications   Prior to Admission medications   Medication Sig Start Date End Date Taking? Authorizing Provider   ACCU-CHEK FASTCLIX LANCETS MISC 1 Device by Does not apply route 3 (three) times daily. Use to check blood sugar up to three times a day 02/08/11   Dianne Dun, MD  albuterol (VENTOLIN HFA) 108 (90 Base) MCG/ACT inhaler INHALE 2 INHALATIONS INTO THE LUNGS EVERY 4 HOURS AS NEEDED FOR WHEEZING 04/09/22   Margarita Mail, DO  alum & mag hydroxide-simeth (MAALOX/MYLANTA) 200-200-20 MG/5ML suspension Take 30 mLs by mouth 2 (two) times daily.    [provider]  chlorpheniramine (CHLOR-TRIMETON) 4 MG tablet Take 4 mg by mouth 2 (two) times daily as needed for allergies.    [provider]  diclofenac (FLECTOR) 1.3 % PTCH APPLY PATCH TO THE MOST PAINFUL AREA ONCE OR TWICE DAILY 04/09/22   Margarita Mail, DO  diclofenac sodium (VOLTAREN) 1 % GEL APPLY 2 GRAMS TOPICALLY 4 TIMES DAILY 04/21/18   Dianne Dun, MD  diphenoxylate-atropine (LOMOTIL) 2.5-0.025 MG tablet Take 2 tablets by mouth in the morning, at noon, and at bedtime. Take with imodium    [provider]  empagliflozin (JARDIANCE) 25 MG TABS tablet TAKE 1 TABLET BY MOUTH ONCE DAILY 03/08/22   Margarita Mail, DO  esomeprazole (NEXIUM) 40 MG capsule TAKE 1 CAPSULE BY MOUTH ONCE DAILY 11/27/19   Erasmo Downer, MD  famotidine (PEPCID) 20 MG tablet Take 1 tablet (20 mg total) by mouth 2 (two) times daily. 04/17/22   Danelle Berry, PA-C  fluticasone (FLONASE) 50 MCG/ACT nasal spray PLACE 2 SPRAYS INTO BOTH NOSTRILS ONCE DAILY 03/08/22   Margarita Mail, DO  glipiZIDE (GLUCOTROL) 10 MG tablet TAKE 1 TABLET BY MOUTH TWICE A DAY BEFORE MEALS 03/08/22   Margarita Mail, DO  hydrOXYzine (VISTARIL) 25 MG capsule Take 1 capsule (25 mg total) by mouth every 8 (eight) hours as needed. 04/17/22   Danelle Berry, PA-C  Insulin Glargine (BASAGLAR KWIKPEN) 100 UNIT/ML Inject 8 Units into the skin 2 (two) times daily. 03/05/22 06/03/22  Margarita Mail, DO  levothyroxine (SYNTHROID) 75 MCG tablet TAKE 1 TABLET BY MOUTH ONCE A DAY.  TAKE ON AN EMPTY STOMACH WITH A GLASS OF WATER ATLEAST 30-60 MIN BEFORE BREAKFAST 03/13/22   Margarita Mail, DO  linagliptin (TRADJENTA) 5 MG TABS tablet TAKE 1 TABLET BY MOUTH ONCE DAILY 03/08/22   Margarita Mail, DO  loperamide (IMODIUM) 2 MG capsule Take 8 mg by mouth 2 (two) times daily. Take with lomotil    [provider]  losartan (COZAAR) 50 MG tablet Take 50 mg by mouth daily.    [provider]  lovastatin (MEVACOR) 40 MG tablet Take 1 tablet (40 mg total) by mouth 2 (two) times daily. Please call [phone] to schedule yearly appointment for further refills. Thank you. 05/10/22   Debbe Odea, MD  metoprolol tartrate (LOPRESSOR) 25 MG tablet TAKE 1 TABLET BY MOUTH TWICE A DAY 03/08/22   Margarita Mail, DO  NIFEdipine (ADALAT CC) 30 MG 24 hr tablet Take 1 tablet (30 mg total) by mouth daily. 01/30/21   Marguerita Merles Latif, DO  nitroGLYCERIN (NITROSTAT) 0.4 MG SL tablet Take SL prn chest pain/May repeat 5  min apart up to 3 times prn 07/21/19   Mliss Sax, MD  nystatin cream (MYCOSTATIN) APPLY TO AFFECTED AREAS ONCE DAILY AS NEEDED 07/30/19   Mliss Sax, MD  pregabalin (LYRICA) 25 MG capsule Take 1 capsule (25 mg total) by mouth 2 (two) times daily. 11/26/19   Erasmo Downer, MD  spironolactone (ALDACTONE) 50 MG tablet TAKE 1 TABLET BY MOUTH ONCE DAILY AS NEEDED FOR SWELLING OF LEGS 04/09/22   Margarita Mail, DO  tiotropium (SPIRIVA HANDIHALER) 18 MCG inhalation capsule PLACE 1 CAPSULE INTO INHALER AND INHALE ONCE DAILY 03/08/22   Margarita Mail, DO  tiZANidine (ZANAFLEX) 4 MG tablet TAKE 1 TABLET BY MOUTH 2 TIMES DAILY AS NEEDED 12/08/21   Margarita Mail, DO  topiramate (TOPAMAX) 100 MG tablet Take 2 tablets (200 mg total) by mouth daily. 03/08/22   Margarita Mail, DO  triamcinolone ointment (KENALOG) 0.1 % Apply 1 Application topically 2 (two) times daily. 04/17/22   Danelle Berry, PA-C  zolpidem (AMBIEN) 5 MG tablet Take 1  tablet (5 mg total) by mouth at bedtime as needed. for sleep 11/26/19   Erasmo Downer, MD     Allergies  Azithromycin, Levofloxacin, Nortriptyline, Psyllium, and Tape   Family History   Family History  Problem Relation Age of Onset   Uterine cancer Mother      Physical Exam  Triage Vital Signs: ED Triage Vitals [07/05/22 0445]  Enc Vitals Group     BP      Pulse      Resp      Temp      Temp src      SpO2      Weight      Height      Head Circumference      Peak Flow      Pain Score 0     Pain Loc      Pain Edu?      Excl. in GC?     Updated Vital Signs: BP (!) 120/90   Pulse 86   Temp 98.1 F (36.7 C) (Oral)   Resp 20   Ht 5\' 5"  (1.651 m)   Wt 89.1 kg   SpO2 99%   BMI 32.68 kg/m    General: Awake, mild to moderate distress.  Diaphoretic. CV:  RRR. Good peripheral perfusion.  Resp:  Normal effort.  Abd:  Mild epigastric tenderness to palpation without rebound or guarding.  No distention.  Other:  No truncal vesicles.   ED Results / Procedures / Treatments  Labs (all labs ordered are listed, but only abnormal results are displayed) Labs Reviewed  CBC WITH DIFFERENTIAL/PLATELET - Abnormal; Notable for the following components:      Result Value   WBC 12.7 (*)    RBC 6.02 (*)    Hemoglobin 16.7 (*)    HCT 53.4 (*)    Neutro Abs 9.9 (*)    All other components within normal limits  COMPREHENSIVE METABOLIC PANEL - Abnormal; Notable for the following components:   Potassium 5.3 (*)    Glucose, Bld 234 (*)    BUN 24 (*)    Creatinine, Ser 1.02 (*)    Total Protein 8.6 (*)    Alkaline Phosphatase 137 (*)    Total Bilirubin 1.5 (*)    GFR, Estimated 57 (*)    All other components within normal limits  LIPASE, BLOOD  URINALYSIS, ROUTINE W REFLEX MICROSCOPIC  TROPONIN I (HIGH SENSITIVITY)  TROPONIN I (HIGH  SENSITIVITY)     EKG  ED ECG REPORT I, Mavi Un J, the attending physician, personally viewed and interpreted this ECG.    Date: 07/05/2022  EKG Time: 0451  Rate: 87  Rhythm: normal sinus rhythm  Axis: LAD  Intervals:left bundle branch block  ST&T Change: Nonspecific    RADIOLOGY I have independently visualized and interpreted patient's CT scan as well as noted the radiology interpretation:  CT abdomen/pelvis: Pneumoperitoneum appears centered around cecum and proximal ascending colon; worsening intra/extrahepatic biliary ductal dilatation  Official radiology report(s): CT ABDOMEN PELVIS W CONTRAST  Result Date: 07/05/2022 CLINICAL DATA:  77 year old female with history of acute onset of upper abdominal pain, nausea and vomiting. EXAM: CT ABDOMEN AND PELVIS WITH CONTRAST TECHNIQUE: Multidetector CT imaging of the abdomen and pelvis was performed using the standard protocol following bolus administration of intravenous contrast. RADIATION DOSE REDUCTION: This exam was performed according to the departmental dose-optimization program which includes automated exposure control, adjustment of the mA and/or kV according to patient size and/or use of iterative reconstruction technique. CONTRAST:  OMNIPAQUE IOHEXOL 300 MG/ML  SOLN COMPARISON:  CT of the abdomen and pelvis 01/24/2021. FINDINGS: Lower chest: Atherosclerotic calcifications in the descending thoracic aorta as well as the left anterior descending coronary artery. Hepatobiliary: No suspicious cystic or solid hepatic lesions. Status post cholecystectomy. Severe intra and extrahepatic biliary ductal dilatation, with the common bile duct measuring up to 21 mm in the porta hepatis, increased compared to the prior study. No calcified choledocholithiasis. Common bile duct dilatation extends all the way to the level of the ampulla. Pancreas: No pancreatic mass. No pancreatic ductal dilatation. No pancreatic or peripancreatic fluid collections or inflammatory changes. Spleen: Unremarkable. Adrenals/Urinary Tract: Low-attenuation lesions in both kidneys compatible with  simple cysts (Bosniak class 1, no imaging follow-up recommended), measuring up to 3.4 cm in the upper pole of the left kidney. No suspicious appearing renal lesions. No hydroureteronephrosis. Urinary bladder is unremarkable in appearance. Bilateral adrenal glands are normal in appearance. Stomach/Bowel: The appearance of the stomach is normal. There are multiple prominent but nondilated loops of small bowel in the central abdomen measuring up to 2.7 cm in diameter, with several air-fluid levels. Distal small bowel appears nearly completely decompressed. Much of the colon appears relatively decompressed, although there is liquid stool in the cecum and proximal colon. Notably, there is extraluminal gas which appears clustered around the cecum and proximal ascending colon. Appendix is not confidently identified. Vascular/Lymphatic: Aortic atherosclerosis, without evidence of aneurysm or dissection in the abdominal or pelvic vasculature. No lymphadenopathy noted in the abdomen or pelvis. Reproductive: Status post hysterectomy. Ovaries are not confidently identified may be surgically absent or atrophic. Other: Moderate amount of pneumoperitoneum. The greatest extent of pneumoperitoneum is clustered in the right-side of the abdomen adjacent to the cecum and proximal ascending colon suggesting this as a site of probable perforation. Musculoskeletal: There are no aggressive appearing lytic or blastic lesions noted in the visualized portions of the skeleton. IMPRESSION: 1. Moderate volume of pneumoperitoneum which appears centered around the cecum and proximal ascending colon suggesting this as the site of probable perforation. The exact cause of perforation is not readily apparent, however, surgical consultation is recommended. 2. Multiple prominent borderline dilated loops of small bowel, many of which demonstrate air-fluid levels. Overall, the pattern does not appear obstructive, favored to represent a reactive ileus. 3.  Status post cholecystectomy with worsening intra and extrahepatic biliary ductal dilatation. This extends all the way to the level of the  ampulla. No retained choledocholithiasis noted. No definite ampullary mass. If the patient has biochemical evidence of biliary tract obstruction, further evaluation with follow-up abdominal MRI with and without IV gadolinium with MRCP should be considered. 4. Aortic atherosclerosis, in addition to left anterior descending coronary artery disease. Assessment for potential risk factor modification, dietary therapy or pharmacologic therapy may be warranted, if clinically indicated. 5. Additional incidental findings, as above. Critical Value/emergent results were called by telephone at the time of interpretation on 07/05/2022 at 6:44 am to provider Kamaya Keckler , who verbally acknowledged these results. Electronically Signed   By: Trudie Reed M.D.   On: 07/05/2022 06:45     PROCEDURES:  Critical Care performed: Yes, see critical care procedure note(s)  CRITICAL CARE Performed by: Irean Hong   Total critical care time: 45 minutes  Critical care time was exclusive of separately billable procedures and treating other patients.  Critical care was necessary to treat or prevent imminent or life-threatening deterioration.  Critical care was time spent personally by me on the following activities: development of treatment plan with patient and/or surrogate as well as nursing, discussions with consultants, evaluation of patient's response to treatment, examination of patient, obtaining history from patient or surrogate, ordering and performing treatments and interventions, ordering and review of laboratory studies, ordering and review of radiographic studies, pulse oximetry and re-evaluation of patient's condition.   Marland Kitchen1-3 Lead EKG Interpretation  Performed by: Irean Hong, MD Authorized by: Irean Hong, MD     Interpretation: normal     ECG rate:  85   ECG rate  assessment: normal     Rhythm: sinus rhythm     Ectopy: none     Conduction: normal   Comments:     Patient placed on cardiac monitor to evaluate for arrhythmias    MEDICATIONS ORDERED IN ED: Medications  pantoprazole (PROTONIX) injection 40 mg (has no administration in time range)  morphine (PF) 4 MG/ML injection 4 mg (has no administration in time range)  piperacillin-tazobactam (ZOSYN) IVPB 3.375 g (has no administration in time range)  sodium chloride 0.9 % bolus 1,000 mL (1,000 mLs Intravenous New Bag/Given 07/05/22 0504)  ondansetron (ZOFRAN) injection 4 mg (4 mg Intravenous Given 07/05/22 0507)  famotidine (PEPCID) IVPB 20 mg premix (0 mg Intravenous Stopped 07/05/22 0541)  iohexol (OMNIPAQUE) 300 MG/ML solution 100 mL (100 mLs Intravenous Contrast Given 07/05/22 0609)     IMPRESSION / MDM / ASSESSMENT AND PLAN / ED COURSE  I reviewed the triage vital signs and the nursing notes.                             77 year old female presenting with abdominal pain, nausea and vomiting after eating [employer]. Differential diagnosis includes, but is not limited to, biliary disease (biliary colic, acute cholecystitis, cholangitis, choledocholithiasis, etc), intrathoracic causes for epigastric abdominal pain including ACS, gastritis, duodenitis, pancreatitis, small bowel or large bowel obstruction, abdominal aortic aneurysm, hernia, and ulcer(s).  I personally reviewed patient's records and note a GYN office visit on 06/29/2022 for vulvar irritation.  Patient's presentation is most consistent with acute presentation with potential threat to life or bodily function.  The patient is on the cardiac monitor to evaluate for evidence of arrhythmia and/or significant heart rate changes.  Will obtain lab work, UA, CT abdomen/pelvis.  Initiate IV fluid resuscitation, IV morphine for pain.  With IV Zofran for nausea.  Will reassess.  Clinical Course  as of 07/05/22 0713  Thu Jul 05, 2022  0650 CT  abd/pelvis discussed with radiologist concerning for perforated bowel around cecum although no definitive source of perforation.  Patient states she has had duodenal ulcer previously.  Will administer Protonix, Morphine as patient is now beginning to hurt and discussed with general surgery. [JS]  4098 Discussed case with general surgery on-call Dr. Tonna Boehringer he will evaluate patient in the emergency department for admission.  Will administer IV Zosyn for broad antibiotic coverage. Updated patient on all test results. [JS]    Clinical Course User Index [JS] Irean Hong, MD     FINAL CLINICAL IMPRESSION(S) / ED DIAGNOSES   Final diagnoses:  Abdominal pain, unspecified abdominal location  Nausea and vomiting, unspecified vomiting type  Pneumoperitoneum     Rx / DC Orders   ED Discharge Orders     None        Note:  This document was prepared using Dragon voice recognition software and may include unintentional dictation errors.   Irean Hong, MD 07/05/22 [phone]

## 2022-07-05 NOTE — H&P (Signed)
History and Physical    ADVAITA MONTET ZHY:[id number] DOB: 12-01-1945 DOA: 07/05/2022  Referring MD/NP/PA:   PCP: Dorothey Baseman, MD   Patient coming from:  The patient is coming from home.     Chief Complaint: Nausea, vomiting, abdominal pain  HPI: Mariah Reed is a 77 y.o. 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  Patient states that her symptoms started last night, including nausea, vomiting and abdominal pain.  She has had multiple episodes of nonbilious nonbloody vomiting.  She states that she has chronic intermittent diarrhea due to ulcerative colitis.  She is taking Imodium prn for diarrhea.  Today she has no diarrhea. Her abdominal pain is located in upper abdomen, constant, severe, aching, nonradiating.  Not alleviated or aggravated by any known factors.  Denies fever or chills.  Patient does not have chest pain, cough, shortness of breath.  No symptoms of UTI.   Data reviewed independently and ED Course: pt was found to have WBC 12.7, lipase 26, potassium 5.3, renal function close to baseline, temperature normal, blood pressure 120/90, heart rate 87, RR 20, oxygen saturation 99% on room air.  Patient is admitted to telemetry bed as inpatient.  Dr. Tonna Boehringer of surgery is consulted.   CT abdomen/pelvis: 1. Moderate volume of pneumoperitoneum which appears centered around the cecum and proximal ascending colon suggesting this as the site of probable perforation. The exact cause of perforation is not readily apparent, however, surgical consultation is recommended. 2. Multiple prominent borderline dilated loops of small bowel, many of which demonstrate air-fluid levels. Overall, the pattern does not appear  obstructive, favored to represent a reactive ileus. 3. Status post cholecystectomy with worsening intra and extrahepatic biliary ductal dilatation. This extends all the way to the level of the ampulla. No retained choledocholithiasis noted. No definite ampullary mass. If the patient has biochemical evidence of biliary tract obstruction, further evaluation with follow-up abdominal MRI with and without IV gadolinium with MRCP should be considered. 4. Aortic atherosclerosis, in addition to left anterior descending coronary artery disease. Assessment for potential risk factor modification, dietary therapy or pharmacologic therapy may be warranted, if clinically indicated. 5. Additional incidental findings, as above.   EKG: I have personally reviewed.  Sinus rhythm, QTc 494, LAD, poor R wave progression, left bundle blockade   Review of Systems:   General: no fevers, chills, no body weight gain, has poor appetite, has fatigue HEENT: no blurry vision, hearing changes or sore throat Respiratory: no dyspnea, coughing, wheezing CV: no chest pain, no palpitations GI: has nausea, vomiting, abdominal pain, diarrhea, no constipation GU: no dysuria, burning on urination, increased urinary frequency, hematuria  Ext: has trace leg edema Neuro: no unilateral weakness, numbness, or tingling, no vision change or hearing loss Skin: no rash, no skin tear. MSK: No muscle spasm, no limitation of range of movement in spin.  has left hand deformity Heme: No easy bruising.  Travel history: No recent long distant travel.   Allergy:  Allergies  Allergen Reactions   Azithromycin Diarrhea   Levofloxacin Other (See Comments)    Made head feel weird   Nortriptyline Other (See Comments)    Caused rectal bleeding    Psyllium Other (See Comments)   Tape Other (See Comments)    Breaks out with Medical Tape  Past Medical History:  Diagnosis Date   Allergy    Anemia    Anginal pain (HCC)     Arthritis    CHF (congestive heart failure) (HCC)    COPD (chronic obstructive pulmonary disease) (HCC)    Diabetes mellitus    GERD (gastroesophageal reflux disease)    Gout    Headache    History of kidney stones    Hyperlipidemia    Hypertension    Hypothyroidism    LBBB (left bundle branch block)    Low back pain    Myocardial infarction (HCC)    Nephrolithiasis    Peripheral neuropathy    Pneumonia    Rhabdomyolysis    a.) noted during 12/16-12/25/2022 admission for sepsis 2/2 RIGHT ureteral and RIGHT staghorn nephrolithiasis.   Sepsis (HCC) 01/20/2021   a.) sepsis 2/2 RIGHT ureteral and RIGHT staghorn nephrolithiasis.    Past Surgical History:  Procedure Laterality Date   ABDOMINAL HYSTERECTOMY     CHOLECYSTECTOMY  unknown   Patient stated she does not remember when it was removed. (possibly 1997)   COLONOSCOPY     CYSTOSCOPY WITH RETROGRADE PYELOGRAM, URETEROSCOPY AND STENT PLACEMENT Right 01/20/2021   Procedure: CYSTOSCOPY WITH RETROGRADE PYELOGRAM, URETEROSCOPY AND STENT PLACEMENT;  Surgeon: Jannifer Hick, MD;  Location: [facility];  Service: Urology;  Laterality: Right;   CYSTOSCOPY/URETEROSCOPY/HOLMIUM LASER/STENT PLACEMENT Right 03/14/2021   Procedure: CYSTOSCOPY/URETEROSCOPY/HOLMIUM LASER/STENT PLACEMENT;  Surgeon: Riki Altes, MD;  Location: [facility];  Service: Urology;  Laterality: Right;   CYSTOSCOPY/URETEROSCOPY/HOLMIUM LASER/STENT PLACEMENT Right 05/16/2021   Procedure: CYSTOSCOPY/URETEROSCOPY/HOLMIUM LASER/STENT REMOVAL VS EXCHANGE;  Surgeon: Riki Altes, MD;  Location: [facility];  Service: Urology;  Laterality: Right;   POLYPECTOMY     Colon   TUBAL LIGATION      Social History:  reports that she has quit smoking. Her smoking use included cigarettes. She has a 25.00 pack-year smoking history. She has never used smokeless tobacco. She reports that she does not drink alcohol and does not use drugs.  Family History:  Family History  Problem Relation  Age of Onset   Uterine cancer Mother      Prior to Admission medications   Medication Sig Start Date End Date Taking? Authorizing Provider  ACCU-CHEK FASTCLIX LANCETS MISC 1 Device by Does not apply route 3 (three) times daily. Use to check blood sugar up to three times a day 02/08/11   Dianne Dun, MD  albuterol (VENTOLIN HFA) 108 (90 Base) MCG/ACT inhaler INHALE 2 INHALATIONS INTO THE LUNGS EVERY 4 HOURS AS NEEDED FOR WHEEZING 04/09/22   Margarita Mail, DO  alum & mag hydroxide-simeth (MAALOX/MYLANTA) 200-200-20 MG/5ML suspension Take 30 mLs by mouth 2 (two) times daily.    [provider]  chlorpheniramine (CHLOR-TRIMETON) 4 MG tablet Take 4 mg by mouth 2 (two) times daily as needed for allergies.    [provider]  diclofenac (FLECTOR) 1.3 % PTCH APPLY PATCH TO THE MOST PAINFUL AREA ONCE OR TWICE DAILY 04/09/22   Margarita Mail, DO  diclofenac sodium (VOLTAREN) 1 % GEL APPLY 2 GRAMS TOPICALLY 4 TIMES DAILY 04/21/18   Dianne Dun, MD  diphenoxylate-atropine (LOMOTIL) 2.5-0.025 MG tablet Take 2 tablets by mouth in the morning, at noon, and at bedtime. Take with imodium    [provider]  empagliflozin (JARDIANCE) 25 MG TABS tablet TAKE 1 TABLET BY MOUTH ONCE DAILY 03/08/22   Margarita Mail, DO  esomeprazole (NEXIUM) 40 MG capsule TAKE 1 CAPSULE BY MOUTH ONCE DAILY  11/27/19   Erasmo Downer, MD  famotidine (PEPCID) 20 MG tablet Take 1 tablet (20 mg total) by mouth 2 (two) times daily. 04/17/22   Danelle Berry, PA-C  fluticasone (FLONASE) 50 MCG/ACT nasal spray PLACE 2 SPRAYS INTO BOTH NOSTRILS ONCE DAILY 03/08/22   Margarita Mail, DO  glipiZIDE (GLUCOTROL) 10 MG tablet TAKE 1 TABLET BY MOUTH TWICE A DAY BEFORE MEALS 03/08/22   Margarita Mail, DO  hydrOXYzine (VISTARIL) 25 MG capsule Take 1 capsule (25 mg total) by mouth every 8 (eight) hours as needed. 04/17/22   Danelle Berry, PA-C  Insulin Glargine (BASAGLAR KWIKPEN) 100 UNIT/ML Inject 8 Units into the  skin 2 (two) times daily. 03/05/22 06/03/22  Margarita Mail, DO  levothyroxine (SYNTHROID) 75 MCG tablet TAKE 1 TABLET BY MOUTH ONCE A DAY. TAKE ON AN EMPTY STOMACH WITH A GLASS OF WATER ATLEAST 30-60 MIN BEFORE BREAKFAST 03/13/22   Margarita Mail, DO  linagliptin (TRADJENTA) 5 MG TABS tablet TAKE 1 TABLET BY MOUTH ONCE DAILY 03/08/22   Margarita Mail, DO  loperamide (IMODIUM) 2 MG capsule Take 8 mg by mouth 2 (two) times daily. Take with lomotil    [provider]  losartan (COZAAR) 50 MG tablet Take 50 mg by mouth daily.    [provider]  lovastatin (MEVACOR) 40 MG tablet Take 1 tablet (40 mg total) by mouth 2 (two) times daily. Please call [phone] to schedule yearly appointment for further refills. Thank you. 05/10/22   Debbe Odea, MD  metoprolol tartrate (LOPRESSOR) 25 MG tablet TAKE 1 TABLET BY MOUTH TWICE A DAY 03/08/22   Margarita Mail, DO  NIFEdipine (ADALAT CC) 30 MG 24 hr tablet Take 1 tablet (30 mg total) by mouth daily. 01/30/21   Marguerita Merles Latif, DO  nitroGLYCERIN (NITROSTAT) 0.4 MG SL tablet Take SL prn chest pain/May repeat 5 min apart up to 3 times prn 07/21/19   Mliss Sax, MD  nystatin cream (MYCOSTATIN) APPLY TO AFFECTED AREAS ONCE DAILY AS NEEDED 07/30/19   Mliss Sax, MD  pregabalin (LYRICA) 25 MG capsule Take 1 capsule (25 mg total) by mouth 2 (two) times daily. 11/26/19   Erasmo Downer, MD  spironolactone (ALDACTONE) 50 MG tablet TAKE 1 TABLET BY MOUTH ONCE DAILY AS NEEDED FOR SWELLING OF LEGS 04/09/22   Margarita Mail, DO  tiotropium (SPIRIVA HANDIHALER) 18 MCG inhalation capsule PLACE 1 CAPSULE INTO INHALER AND INHALE ONCE DAILY 03/08/22   Margarita Mail, DO  tiZANidine (ZANAFLEX) 4 MG tablet TAKE 1 TABLET BY MOUTH 2 TIMES DAILY AS NEEDED 12/08/21   Margarita Mail, DO  topiramate (TOPAMAX) 100 MG tablet Take 2 tablets (200 mg total) by mouth daily. 03/08/22   Margarita Mail, DO  triamcinolone  ointment (KENALOG) 0.1 % Apply 1 Application topically 2 (two) times daily. 04/17/22   Danelle Berry, PA-C  zolpidem (AMBIEN) 5 MG tablet Take 1 tablet (5 mg total) by mouth at bedtime as needed. for sleep 11/26/19   Erasmo Downer, MD    Physical Exam: Vitals:   07/05/22 0800 07/05/22 0830 07/05/22 0900 07/05/22 0905  BP: 135/73  113/74 114/67  Pulse: 85 91 78 80  Resp: 16 (!) 21 14 17   Temp: 98.5 F (36.9 C)     TempSrc: Oral     SpO2: 95% 95% 94% 96%  Weight:      Height:       General: Not in acute distress HEENT:       Eyes: PERRL, EOMI, no  scleral icterus.       ENT: No discharge from the ears and nose, no pharynx injection, no tonsillar enlargement.        Neck: No JVD, no bruit, no mass felt. Heme: No neck lymph node enlargement. Cardiac: S1/S2, RRR, No murmurs, No gallops or rubs. Respiratory: No rales, wheezing, rhonchi or rubs. GI: Soft, nondistended, has tenderness  in upper abdomen, no rebound pain, no organomegaly, BS present. GU: No hematuria Ext: Trace leg edema bilaterally. 1+DP/PT pulse bilaterally. Musculoskeletal:  No joint redness or warmth, no limitation of ROM in spin.  Has left hand deformity Skin: No rashes.  Neuro: Alert, oriented X3, cranial nerves II-XII grossly intact, moves all extremities normally.  Psych: Patient is not psychotic, no suicidal or hemocidal ideation.  Labs on Admission: I have personally reviewed following labs and imaging studies  CBC: Recent Labs  Lab 07/05/22 0454  WBC 12.7*  NEUTROABS 9.9*  HGB 16.7*  HCT 53.4*  MCV 88.7  PLT 198   Basic Metabolic Panel: Recent Labs  Lab 07/05/22 0454  NA 137  K 5.3*  CL 103  CO2 24  GLUCOSE 234*  BUN 24*  CREATININE 1.02*  CALCIUM 9.5   GFR: Estimated Creatinine Clearance: 50.9 mL/min (A) (by C-G formula based on SCr of 1.02 mg/dL (H)). Liver Function Tests: Recent Labs  Lab 07/05/22 0454  AST 31  ALT 36  ALKPHOS 137*  BILITOT 1.5*  PROT 8.6*  ALBUMIN 4.6    Recent Labs  Lab 07/05/22 0454  LIPASE 26   No results for input(s): "AMMONIA" in the last 168 hours. Coagulation Profile: No results for input(s): "INR", "PROTIME" in the last 168 hours. Cardiac Enzymes: No results for input(s): "CKTOTAL", "CKMB", "CKMBINDEX", "TROPONINI" in the last 168 hours. BNP (last 3 results) No results for input(s): "PROBNP" in the last 8760 hours. HbA1C: No results for input(s): "HGBA1C" in the last 72 hours. CBG: Recent Labs  Lab 07/05/22 0822  GLUCAP 237*   Lipid Profile: No results for input(s): "CHOL", "HDL", "LDLCALC", "TRIG", "CHOLHDL", "LDLDIRECT" in the last 72 hours. Thyroid Function Tests: No results for input(s): "TSH", "T4TOTAL", "FREET4", "T3FREE", "THYROIDAB" in the last 72 hours. Anemia Panel: No results for input(s): "VITAMINB12", "FOLATE", "FERRITIN", "TIBC", "IRON", "RETICCTPCT" in the last 72 hours. Urine analysis:    Component Value Date/Time   COLORURINE YELLOW 01/20/2021 1421   APPEARANCEUR Clear 05/04/2021 1122   LABSPEC 1.005 01/20/2021 1421   PHURINE 6.0 01/20/2021 1421   GLUCOSEU 3+ (A) 05/04/2021 1122   GLUCOSEU NEGATIVE 03/17/2019 1135   HGBUR LARGE (A) 01/20/2021 1421   HGBUR negative 12/15/2009 1001   BILIRUBINUR Negative 05/04/2021 1122   KETONESUR NEGATIVE 01/20/2021 1421   PROTEINUR Trace (A) 05/04/2021 1122   PROTEINUR NEGATIVE 01/20/2021 1421   UROBILINOGEN 0.2 03/17/2019 1135   NITRITE Negative 05/04/2021 1122   NITRITE NEGATIVE 01/20/2021 1421   LEUKOCYTESUR 1+ (A) 05/04/2021 1122   LEUKOCYTESUR MODERATE (A) 01/20/2021 1421   Sepsis Labs: @LABRCNTIP (procalcitonin:4,lacticidven:4) )No results found for this or any previous visit (from the past 240 hour(s)).   Radiological Exams on Admission: CT ABDOMEN PELVIS W CONTRAST  Result Date: 07/05/2022 CLINICAL DATA:  77 year old female with history of acute onset of upper abdominal pain, nausea and vomiting. EXAM: CT ABDOMEN AND PELVIS WITH CONTRAST  TECHNIQUE: Multidetector CT imaging of the abdomen and pelvis was performed using the standard protocol following bolus administration of intravenous contrast. RADIATION DOSE REDUCTION: This exam was performed according to the departmental dose-optimization program  which includes automated exposure control, adjustment of the mA and/or kV according to patient size and/or use of iterative reconstruction technique. CONTRAST:  OMNIPAQUE IOHEXOL 300 MG/ML  SOLN COMPARISON:  CT of the abdomen and pelvis 01/24/2021. FINDINGS: Lower chest: Atherosclerotic calcifications in the descending thoracic aorta as well as the left anterior descending coronary artery. Hepatobiliary: No suspicious cystic or solid hepatic lesions. Status post cholecystectomy. Severe intra and extrahepatic biliary ductal dilatation, with the common bile duct measuring up to 21 mm in the porta hepatis, increased compared to the prior study. No calcified choledocholithiasis. Common bile duct dilatation extends all the way to the level of the ampulla. Pancreas: No pancreatic mass. No pancreatic ductal dilatation. No pancreatic or peripancreatic fluid collections or inflammatory changes. Spleen: Unremarkable. Adrenals/Urinary Tract: Low-attenuation lesions in both kidneys compatible with simple cysts (Bosniak class 1, no imaging follow-up recommended), measuring up to 3.4 cm in the upper pole of the left kidney. No suspicious appearing renal lesions. No hydroureteronephrosis. Urinary bladder is unremarkable in appearance. Bilateral adrenal glands are normal in appearance. Stomach/Bowel: The appearance of the stomach is normal. There are multiple prominent but nondilated loops of small bowel in the central abdomen measuring up to 2.7 cm in diameter, with several air-fluid levels. Distal small bowel appears nearly completely decompressed. Much of the colon appears relatively decompressed, although there is liquid stool in the cecum and proximal colon.  Notably, there is extraluminal gas which appears clustered around the cecum and proximal ascending colon. Appendix is not confidently identified. Vascular/Lymphatic: Aortic atherosclerosis, without evidence of aneurysm or dissection in the abdominal or pelvic vasculature. No lymphadenopathy noted in the abdomen or pelvis. Reproductive: Status post hysterectomy. Ovaries are not confidently identified may be surgically absent or atrophic. Other: Moderate amount of pneumoperitoneum. The greatest extent of pneumoperitoneum is clustered in the right-side of the abdomen adjacent to the cecum and proximal ascending colon suggesting this as a site of probable perforation. Musculoskeletal: There are no aggressive appearing lytic or blastic lesions noted in the visualized portions of the skeleton. IMPRESSION: 1. Moderate volume of pneumoperitoneum which appears centered around the cecum and proximal ascending colon suggesting this as the site of probable perforation. The exact cause of perforation is not readily apparent, however, surgical consultation is recommended. 2. Multiple prominent borderline dilated loops of small bowel, many of which demonstrate air-fluid levels. Overall, the pattern does not appear obstructive, favored to represent a reactive ileus. 3. Status post cholecystectomy with worsening intra and extrahepatic biliary ductal dilatation. This extends all the way to the level of the ampulla. No retained choledocholithiasis noted. No definite ampullary mass. If the patient has biochemical evidence of biliary tract obstruction, further evaluation with follow-up abdominal MRI with and without IV gadolinium with MRCP should be considered. 4. Aortic atherosclerosis, in addition to left anterior descending coronary artery disease. Assessment for potential risk factor modification, dietary therapy or pharmacologic therapy may be warranted, if clinically indicated. 5. Additional incidental findings, as above. Critical  Value/emergent results were called by telephone at the time of interpretation on 07/05/2022 at 6:44 am to provider JADE SUNG , who verbally acknowledged these results. Electronically Signed   By: Trudie Reed M.D.   On: 07/05/2022 06:45      Assessment/Plan Principal Problem:   Colon perforation (HCC) Active Problems:   Essential hypertension   COPD (chronic obstructive pulmonary disease) (HCC)   Chronic diastolic CHF (congestive heart failure) (HCC)   Hypothyroidism   Hyperkalemia   HLD (hyperlipidemia)  CAD (coronary artery disease)   Type II diabetes mellitus with renal manifestations (HCC)   CKD (chronic kidney disease) stage 2, GFR 60-89 ml/min   Obesity (BMI 30-39.9)   Assessment and Plan:  Colon perforation [facility]): CT scan showed possible perforation of cecum.  Patient has mild leukocytosis with WBC 12.7, but no fever.  Clinically not septic.  Hemodynamically stable.  Consulted to Dr. Tonna Boehringer of general surgery  -Admitted to telemetry bed as inpatient -IV Zosyn -Pain control: As needed morphine, Percocet, Tylenol -N.p.o. -IV fluid: Patient received 1 L normal saline, then 75 cc/h  Essential hypertension -IV hydralazine as needed -Hold spironolactone and Cozaar due to hyperkalemia -Metoprolol, nifedipine  COPD (chronic obstructive pulmonary disease) (HCC): Stable -Bronchodilators and as needed Mucinex  Chronic diastolic CHF (congestive heart failure) (HCC): 2D echo on 01/24/2021 showed EF of 50 to 55%.  Patient has a trace leg edema, no JVD.  CHF seem to be compensated. -Hold spironolactone due to hyperkalemia -Check BNP  Hypothyroidism -Synthroid  Hyperkalemia: Potassium 5.3, mild -Repeate potassium level -IV fluid as above  HLD (hyperlipidemia): Patient is taking lovastatin at home -Pravastatin hospital  CAD (coronary artery disease): no chest pain -Pravastatin  Type II diabetes mellitus with renal manifestations [facility]): Recent A1c 9.4, poorly  controlled.  Patient taking Tradjenta and glargine insulin 16 units twice daily -SSI -Glargine insulin 8 unit twice daily   CKD (chronic kidney disease) stage 2, GFR 60-89 ml/min: Renal function close to baseline.  Creatinine 1.02, BUN 24, GFR 57.  Recent baseline creatinine 1.01 on 08/23/2021 -Follow-up with BMP  Obesity (BMI 30-39.9): Body weight 89.1 kg, BMI 32.68 -Encourage losing weight -Exercise and healthy diet      DVT ppx: SCD  Code Status: Full code per pt  Family Communication: I called her daughter using # given by patient, [phone], her daughter did not pick up the phone.  I left a message to her.  Disposition Plan:  Anticipate discharge back to previous environment  Consults called:  Dr. Tonna Boehringer of surgery is consulted.  Admission status and Level of care: Telemetry Medical:     as inpt       Dispo: The patient is from: Home              Anticipated d/c is to: Home              Anticipated d/c date is: 2 days              Patient currently is not medically stable to d/c.    Severity of Illness:  The appropriate patient status for this patient is INPATIENT. Inpatient status is judged to be reasonable and necessary in order to provide the required intensity of service to ensure the patient's safety. The patient's presenting symptoms, physical exam findings, and initial radiographic and laboratory data in the context of their chronic comorbidities is felt to place them at high risk for further clinical deterioration. Furthermore, it is not anticipated that the patient will be medically stable for discharge from the hospital within 2 midnights of admission.   * I certify that at the point of admission it is my clinical judgment that the patient will require inpatient hospital care spanning beyond 2 midnights from the point of admission due to high intensity of service, high risk for further deterioration and high frequency of surveillance required.*       Date  of Service 07/05/2022    Lorretta Harp [facility]   If 7PM-7AM,  please contact night-coverage [email or web address] 07/05/2022, 11:07 AM

## 2022-07-05 NOTE — Consult Note (Signed)
Subjective:   CC: Cecal perforation  HPI:  Mariah Reed is a 77 y.o. female who was consulted by Dolores Frame for issue above.  Patient reports developing upper respiratory type symptoms a week and a half ago.  She was not feeling well last night and went to bed around 8 PM, woke up around 2 AM with sudden onset abdominal pain associated with nausea vomiting.  Pain located within the epigastric region and nonradiating.    Patient reports history of ulcerative colitis but no follow-up after a hospitalization.  She states she was given some medications and completed that medications and her symptoms improved along with diet modifications.  She has since not continue to follow the dietary recommendations.  She reports a colonoscopy in the past but states that she will never undergo a colonoscopy again due to some issues postprocedure.  She reports she has never seen a GI provider, will chart review shows she has been seen by [facility] clinic GI last year for chronic diarrhea.  She also reports a history of a perforated duodenal ulcer, but does not report any previous surgeries to address it.   ED workup as noted below.  Currently, patient states pain has completely resolved after administration of pain medication.   Past Medical History:  has a past medical history of Allergy, Anemia, Anginal pain (HCC), Arthritis, CHF (congestive heart failure) (HCC), COPD (chronic obstructive pulmonary disease) (HCC), Diabetes mellitus, GERD (gastroesophageal reflux disease), Gout, Headache, History of kidney stones, Hyperlipidemia, Hypertension, Hypothyroidism, LBBB (left bundle branch block), Low back pain, Myocardial infarction [facility]), Nephrolithiasis, Peripheral neuropathy, Pneumonia, Rhabdomyolysis, and Sepsis (HCC) (01/20/2021).  Past Surgical History:  Past Surgical History:  Procedure Laterality Date   ABDOMINAL HYSTERECTOMY     CHOLECYSTECTOMY  unknown   Patient stated she does not remember when it was removed.  (possibly 1997)   COLONOSCOPY     CYSTOSCOPY WITH RETROGRADE PYELOGRAM, URETEROSCOPY AND STENT PLACEMENT Right 01/20/2021   Procedure: CYSTOSCOPY WITH RETROGRADE PYELOGRAM, URETEROSCOPY AND STENT PLACEMENT;  Surgeon: Jannifer Hick, MD;  Location: [facility];  Service: Urology;  Laterality: Right;   CYSTOSCOPY/URETEROSCOPY/HOLMIUM LASER/STENT PLACEMENT Right 03/14/2021   Procedure: CYSTOSCOPY/URETEROSCOPY/HOLMIUM LASER/STENT PLACEMENT;  Surgeon: Riki Altes, MD;  Location: [facility] OR[facility];  Service: Urology;  Laterality: Right;   CYSTOSCOPY/URETEROSCOPY/HOLMIUM LASER/STENT PLACEMENT Right 05/16/2021   Procedure: CYSTOSCOPY/URETEROSCOPY/HOLMIUM LASER/STENT REMOVAL VS EXCHANGE;  Surgeon: Riki Altes, MD;  Location: [facility] OR[facility];  Service: Urology;  Laterality: Right;   POLYPECTOMY     Colon   TUBAL LIGATION      Family History: family history includes Uterine cancer in her mother.  Social History:  reports that she has quit smoking. Her smoking use included cigarettes. She has a 25.00 pack-year smoking history. She has never used smokeless tobacco. She reports that she does not drink alcohol and does not use drugs.  Current Medications:  Prior to Admission medications   Medication Sig Start Date End Date Taking? Authorizing Provider  ACCU-CHEK FASTCLIX LANCETS MISC 1 Device by Does not apply route 3 (three) times daily. Use to check blood sugar up to three times a day 02/08/11   Dianne Dun, MD  albuterol (VENTOLIN HFA) 108 (90 Base) MCG/ACT inhaler INHALE 2 INHALATIONS INTO THE LUNGS EVERY 4 HOURS AS NEEDED FOR WHEEZING 04/09/22   Margarita Mail, DO  alum & mag hydroxide-simeth (MAALOX/MYLANTA) 200-200-20 MG/5ML suspension Take 30 mLs by mouth 2 (two) times daily.    [provider]  chlorpheniramine (CHLOR-TRIMETON) 4 MG tablet Take  4 mg by mouth 2 (two) times daily as needed for allergies.    [provider]  diclofenac (FLECTOR) 1.3 % PTCH APPLY PATCH TO THE MOST PAINFUL AREA  ONCE OR TWICE DAILY 04/09/22   Margarita Mail, DO  diclofenac sodium (VOLTAREN) 1 % GEL APPLY 2 GRAMS TOPICALLY 4 TIMES DAILY 04/21/18   Dianne Dun, MD  diphenoxylate-atropine (LOMOTIL) 2.5-0.025 MG tablet Take 2 tablets by mouth in the morning, at noon, and at bedtime. Take with imodium    [provider]  empagliflozin (JARDIANCE) 25 MG TABS tablet TAKE 1 TABLET BY MOUTH ONCE DAILY 03/08/22   Margarita Mail, DO  esomeprazole (NEXIUM) 40 MG capsule TAKE 1 CAPSULE BY MOUTH ONCE DAILY 11/27/19   Erasmo Downer, MD  famotidine (PEPCID) 20 MG tablet Take 1 tablet (20 mg total) by mouth 2 (two) times daily. 04/17/22   Danelle Berry, PA-C  fluticasone (FLONASE) 50 MCG/ACT nasal spray PLACE 2 SPRAYS INTO BOTH NOSTRILS ONCE DAILY 03/08/22   Margarita Mail, DO  glipiZIDE (GLUCOTROL) 10 MG tablet TAKE 1 TABLET BY MOUTH TWICE A DAY BEFORE MEALS 03/08/22   Margarita Mail, DO  hydrOXYzine (VISTARIL) 25 MG capsule Take 1 capsule (25 mg total) by mouth every 8 (eight) hours as needed. 04/17/22   Danelle Berry, PA-C  Insulin Glargine (BASAGLAR KWIKPEN) 100 UNIT/ML Inject 8 Units into the skin 2 (two) times daily. 03/05/22 06/03/22  Margarita Mail, DO  levothyroxine (SYNTHROID) 75 MCG tablet TAKE 1 TABLET BY MOUTH ONCE A DAY. TAKE ON AN EMPTY STOMACH WITH A GLASS OF WATER ATLEAST 30-60 MIN BEFORE BREAKFAST 03/13/22   Margarita Mail, DO  linagliptin (TRADJENTA) 5 MG TABS tablet TAKE 1 TABLET BY MOUTH ONCE DAILY 03/08/22   Margarita Mail, DO  loperamide (IMODIUM) 2 MG capsule Take 8 mg by mouth 2 (two) times daily. Take with lomotil    [provider]  losartan (COZAAR) 50 MG tablet Take 50 mg by mouth daily.    [provider]  lovastatin (MEVACOR) 40 MG tablet Take 1 tablet (40 mg total) by mouth 2 (two) times daily. Please call [phone] to schedule yearly appointment for further refills. Thank you. 05/10/22   Debbe Odea, MD  metoprolol tartrate (LOPRESSOR) 25 MG  tablet TAKE 1 TABLET BY MOUTH TWICE A DAY 03/08/22   Margarita Mail, DO  NIFEdipine (ADALAT CC) 30 MG 24 hr tablet Take 1 tablet (30 mg total) by mouth daily. 01/30/21   Marguerita Merles Latif, DO  nitroGLYCERIN (NITROSTAT) 0.4 MG SL tablet Take SL prn chest pain/May repeat 5 min apart up to 3 times prn 07/21/19   Mliss Sax, MD  nystatin cream (MYCOSTATIN) APPLY TO AFFECTED AREAS ONCE DAILY AS NEEDED 07/30/19   Mliss Sax, MD  pregabalin (LYRICA) 25 MG capsule Take 1 capsule (25 mg total) by mouth 2 (two) times daily. 11/26/19   Erasmo Downer, MD  spironolactone (ALDACTONE) 50 MG tablet TAKE 1 TABLET BY MOUTH ONCE DAILY AS NEEDED FOR SWELLING OF LEGS 04/09/22   Margarita Mail, DO  tiotropium (SPIRIVA HANDIHALER) 18 MCG inhalation capsule PLACE 1 CAPSULE INTO INHALER AND INHALE ONCE DAILY 03/08/22   Margarita Mail, DO  tiZANidine (ZANAFLEX) 4 MG tablet TAKE 1 TABLET BY MOUTH 2 TIMES DAILY AS NEEDED 12/08/21   Margarita Mail, DO  topiramate (TOPAMAX) 100 MG tablet Take 2 tablets (200 mg total) by mouth daily. 03/08/22   Margarita Mail, DO  triamcinolone ointment (KENALOG) 0.1 % Apply 1 Application topically  2 (two) times daily. 04/17/22   Danelle Berry, PA-C  zolpidem (AMBIEN) 5 MG tablet Take 1 tablet (5 mg total) by mouth at bedtime as needed. for sleep 11/26/19   Erasmo Downer, MD    Allergies:  Allergies as of 07/05/2022 - Review Complete 07/05/2022  Allergen Reaction Noted   Azithromycin Diarrhea 09/07/2021   Levofloxacin Other (See Comments) 07/17/2010   Nortriptyline Other (See Comments) 05/31/2020   Psyllium Other (See Comments) 09/07/2021   Tape Other (See Comments) 03/09/2021    ROS:  General: Denies weight loss, weight gain, fatigue, fevers, chills, and night sweats. Eyes: Denies blurry vision, double vision, eye pain, itchy eyes, and tearing. Ears: Denies hearing loss, earache, and ringing in ears. Nose: Denies sinus pain, congestion,  infections, runny nose, and nosebleeds. Mouth/throat: Denies hoarseness, sore throat, bleeding gums, and difficulty swallowing. Heart: Denies chest pain, palpitations, racing heart, irregular heartbeat, leg pain or swelling, and decreased activity tolerance. Respiratory: Denies breathing difficulty, shortness of breath, wheezing, cough, and sputum. GI: Denies constipation,  and blood in stool. GU: Denies difficulty urinating, pain with urinating, urgency, frequency, blood in urine. Musculoskeletal: Denies joint stiffness, pain, swelling, muscle weakness. Skin: Denies rash, itching, mass, tumors, sores, and boils Neurologic: Denies headache, fainting, dizziness, seizures, numbness, and tingling. Psychiatric: Denies depression, anxiety, difficulty sleeping, and memory loss. Endocrine: Denies heat or cold intolerance, and increased thirst or urination. Blood/lymph: Denies easy bruising, easy bruising, and swollen glands     Objective:     BP (!) 120/90   Pulse 86   Temp 98.1 F (36.7 C) (Oral)   Resp 20   Ht 5\' 5"  (1.651 m)   Wt 89.1 kg   SpO2 99%   BMI 32.68 kg/m   Constitutional :  alert, cooperative, appears stated age, and no distress  Lymphatics/Throat:  no asymmetry, masses, or scars  Respiratory:  clear to auscultation bilaterally  Cardiovascular:  regular rate and rhythm  Gastrointestinal: soft, non-tender; bowel sounds normal; no masses,  no organomegaly.   Musculoskeletal: Steady movement  Skin: Cool and moist  Psychiatric: Normal affect, non-agitated, not confused       LABS:     Latest Ref Rng & Units 07/05/2022    4:54 AM 08/23/2021    3:55 PM 01/29/2021    5:28 AM  CMP  Glucose 70 - 99 mg/dL 409  811  914   BUN 8 - 23 mg/dL 24  16  13    Creatinine 0.44 - 1.00 mg/dL 7.82  9.56  2.13   Sodium 135 - 145 mmol/L 137  137  139   Potassium 3.5 - 5.1 mmol/L 5.3  4.0  3.6   Chloride 98 - 111 mmol/L 103  105  111   CO2 22 - 32 mmol/L 24  24  21    Calcium 8.9 - 10.3  mg/dL 9.5  9.3  9.0   Total Protein 6.5 - 8.1 g/dL 8.6   7.0   Total Bilirubin 0.3 - 1.2 mg/dL 1.5   0.6   Alkaline Phos 38 - 126 U/L 137   141   AST 15 - 41 U/L 31   19   ALT 0 - 44 U/L 36   22       Latest Ref Rng & Units 07/05/2022    4:54 AM 08/23/2021    3:55 PM 01/29/2021    5:28 AM  CBC  WBC 4.0 - 10.5 K/uL 12.7  6.6  5.5   Hemoglobin 12.0 - 15.0  g/dL 16.1  09.6  04.5   Hematocrit 36.0 - 46.0 % 53.4  45.1  43.9   Platelets 150 - 400 K/uL 198  169  206     RADS: CLINICAL DATA:  77 year old female with history of acute onset of upper abdominal pain, nausea and vomiting.   EXAM: CT ABDOMEN AND PELVIS WITH CONTRAST   TECHNIQUE: Multidetector CT imaging of the abdomen and pelvis was performed using the standard protocol following bolus administration of intravenous contrast.   RADIATION DOSE REDUCTION: This exam was performed according to the departmental dose-optimization program which includes automated exposure control, adjustment of the mA and/or kV according to patient size and/or use of iterative reconstruction technique.   CONTRAST:  OMNIPAQUE IOHEXOL 300 MG/ML  SOLN   COMPARISON:  CT of the abdomen and pelvis 01/24/2021.   FINDINGS: Lower chest: Atherosclerotic calcifications in the descending thoracic aorta as well as the left anterior descending coronary artery.   Hepatobiliary: No suspicious cystic or solid hepatic lesions. Status post cholecystectomy. Severe intra and extrahepatic biliary ductal dilatation, with the common bile duct measuring up to 21 mm in the porta hepatis, increased compared to the prior study. No calcified choledocholithiasis. Common bile duct dilatation extends all the way to the level of the ampulla.   Pancreas: No pancreatic mass. No pancreatic ductal dilatation. No pancreatic or peripancreatic fluid collections or inflammatory changes.   Spleen: Unremarkable.   Adrenals/Urinary Tract: Low-attenuation lesions in both  kidneys compatible with simple cysts (Bosniak class 1, no imaging follow-up recommended), measuring up to 3.4 cm in the upper pole of the left kidney. No suspicious appearing renal lesions. No hydroureteronephrosis. Urinary bladder is unremarkable in appearance. Bilateral adrenal glands are normal in appearance.   Stomach/Bowel: The appearance of the stomach is normal. There are multiple prominent but nondilated loops of small bowel in the central abdomen measuring up to 2.7 cm in diameter, with several air-fluid levels. Distal small bowel appears nearly completely decompressed. Much of the colon appears relatively decompressed, although there is liquid stool in the cecum and proximal colon. Notably, there is extraluminal gas which appears clustered around the cecum and proximal ascending colon. Appendix is not confidently identified.   Vascular/Lymphatic: Aortic atherosclerosis, without evidence of aneurysm or dissection in the abdominal or pelvic vasculature. No lymphadenopathy noted in the abdomen or pelvis.   Reproductive: Status post hysterectomy. Ovaries are not confidently identified may be surgically absent or atrophic.   Other: Moderate amount of pneumoperitoneum. The greatest extent of pneumoperitoneum is clustered in the right-side of the abdomen adjacent to the cecum and proximal ascending colon suggesting this as a site of probable perforation.   Musculoskeletal: There are no aggressive appearing lytic or blastic lesions noted in the visualized portions of the skeleton.   IMPRESSION: 1. Moderate volume of pneumoperitoneum which appears centered around the cecum and proximal ascending colon suggesting this as the site of probable perforation. The exact cause of perforation is not readily apparent, however, surgical consultation is recommended. 2. Multiple prominent borderline dilated loops of small bowel, many of which demonstrate air-fluid levels. Overall, the  pattern does not appear obstructive, favored to represent a reactive ileus. 3. Status post cholecystectomy with worsening intra and extrahepatic biliary ductal dilatation. This extends all the way to the level of the ampulla. No retained choledocholithiasis noted. No definite ampullary mass. If the patient has biochemical evidence of biliary tract obstruction, further evaluation with follow-up abdominal MRI with and without IV gadolinium with MRCP should be  considered. 4. Aortic atherosclerosis, in addition to left anterior descending coronary artery disease. Assessment for potential risk factor modification, dietary therapy or pharmacologic therapy may be warranted, if clinically indicated. 5. Additional incidental findings, as above.   Critical Value/emergent results were called by telephone at the time of interpretation on 07/05/2022 at 6:44 am to provider JADE SUNG , who verbally acknowledged these results.     Electronically Signed   By: Trudie Reed M.D.   On: 07/05/2022 06:45 Assessment:   Abdominal pain, CT scan concerning for perforation at the cecum, although patient reports pain was mainly epigastric region.  Plan:   Vital signs stable for now and abdominal exam is completely benign with some pain medications.  Patient is a high risk candidate for perioperative complications secondary to her multiple comorbidities.  Due to her stable physical exam and vitals at this time recommend serial abdominal exams and continue to monitor.  Will start IV antibiotics in the meantime and treat this as potentially a perforated diverticulitis.  Unclear history of ulcerative colitis, which could potentially be related to the perforation.  Depending on her clinical course may need request GI service consultation for further workup for this.  Also recommend admission with hospitalist for management of her multiple comorbidities.  The patient verbalized understanding and all questions  were answered to the patient's satisfaction.  labs/images/medications/previous chart entries reviewed personally and relevant changes/updates noted above.

## 2022-07-05 NOTE — ED Notes (Signed)
This RN contacted Dr. Clyde Lundborg about pt's PO med orders since pt. Is NPO. Dr. Clyde Lundborg  states ok to give pt. Sips of water with meds.

## 2022-07-05 NOTE — ED Triage Notes (Signed)
Patient arrives from home by EMS, complaining of upper abdominal pain with nausea and vomiting.  Patient is noted to be diaphoretic.  EMS reports she was extremely diaphoretic on scene.  Patient says this all started around 2100 last night.  EMS gave of fentanyl, and 4mg  of zofran IV.

## 2022-07-05 NOTE — ED Notes (Signed)
This RN called floor to let them know pt. En route

## 2022-07-05 NOTE — ED Notes (Signed)
Pt. C/o sudden onset midline CP, denies dizziness, SOB or nausea with pain. EKG, and VS obtained Dr. Clyde Lundborg notified. Will continue to monitor.

## 2022-07-05 NOTE — Consult Note (Signed)
Pharmacy Antibiotic Note  Mariah Reed is a 77 y.o. female admitted on 07/05/2022 with  colon perforation .  Pharmacy has been consulted for Zosyn dosing.  Assessment: 77 y.o. female with PMH ulcerative colitis who presents with N/V and upper abdominal pain. CTAP identifies probably perforation around cecum and proximal ascending colon as well as a reactive ileus around small bowel. Surgery has been consulted. Patient received Zosyn 3.375 g IV x 1 in the ED.  Plan: Initiate Zosyn 3.375 g IV q8H (4-hr infusion) Follow up culture results to assess for antibiotic optimization Monitor renal function to assess for any necessary antibiotic dosing changes  Height: 5\' 5"  (165.1 cm) Weight: 89.1 kg (196 lb 6.4 oz) IBW/kg (Calculated) : 57  Temp (24hrs), Avg:98.1 F (36.7 C), Min:98.1 F (36.7 C), Max:98.1 F (36.7 C)  Recent Labs  Lab 07/05/22 0454  WBC 12.7*  CREATININE 1.02*    Estimated Creatinine Clearance: 50.9 mL/min (A) (by C-G formula based on SCr of 1.02 mg/dL (H)).    Allergies  Allergen Reactions   Azithromycin Diarrhea   Levofloxacin Other (See Comments)    Made head feel weird   Nortriptyline Other (See Comments)    Caused rectal bleeding    Psyllium Other (See Comments)   Tape Other (See Comments)    Breaks out with Medical Tape     Antimicrobials this admission: Zosyn 5/30 >>   Dose adjustments this admission: N/A  Microbiology results: 5/30 BCx: sent   Thank you for allowing pharmacy to be a part of this patient's care.  Will M. Dareen Piano, PharmD PGY-1 Pharmacy Resident 07/05/2022 8:08 AM

## 2022-07-06 DIAGNOSIS — I5032 Chronic diastolic (congestive) heart failure: Secondary | ICD-10-CM | POA: Diagnosis not present

## 2022-07-06 DIAGNOSIS — E872 Acidosis, unspecified: Secondary | ICD-10-CM | POA: Diagnosis not present

## 2022-07-06 DIAGNOSIS — K631 Perforation of intestine (nontraumatic): Secondary | ICD-10-CM | POA: Diagnosis not present

## 2022-07-06 LAB — CBC
HCT: 46.3 % — ABNORMAL HIGH (ref 36.0–46.0)
Hemoglobin: 14 g/dL (ref 12.0–15.0)
MCH: 28 pg (ref 26.0–34.0)
MCHC: 30.2 g/dL (ref 30.0–36.0)
MCV: 92.6 fL (ref 80.0–100.0)
Platelets: 149 10*3/uL — ABNORMAL LOW (ref 150–400)
RBC: 5 MIL/uL (ref 3.87–5.11)
RDW: 14 % (ref 11.5–15.5)
WBC: 6.1 10*3/uL (ref 4.0–10.5)
nRBC: 0 % (ref 0.0–0.2)

## 2022-07-06 LAB — GLUCOSE, CAPILLARY
Glucose-Capillary: 125 mg/dL — ABNORMAL HIGH (ref 70–99)
Glucose-Capillary: 102 mg/dL — ABNORMAL HIGH (ref 70–99)
Glucose-Capillary: 306 mg/dL — ABNORMAL HIGH (ref 70–99)
Glucose-Capillary: 202 mg/dL — ABNORMAL HIGH (ref 70–99)

## 2022-07-06 LAB — BASIC METABOLIC PANEL
Anion gap: 10 (ref 5–15)
BUN: 22 mg/dL (ref 8–23)
CO2: 18 mmol/L — ABNORMAL LOW (ref 22–32)
Calcium: 8.2 mg/dL — ABNORMAL LOW (ref 8.9–10.3)
Chloride: 109 mmol/L (ref 98–111)
Creatinine, Ser: 0.81 mg/dL (ref 0.44–1.00)
GFR, Estimated: >60 mL/min (ref >60)
Glucose, Bld: 123 mg/dL — ABNORMAL HIGH (ref 70–99)
Potassium: 4.2 mmol/L (ref 3.5–5.1)
Sodium: 137 mmol/L (ref 135–145)

## 2022-07-06 LAB — CULTURE, BLOOD (ROUTINE X 2)

## 2022-07-06 MED ORDER — LORATADINE 10 MG PO TABS
10.0000 mg | ORAL_TABLET | Freq: Every day | ORAL | Status: DC
  Administered 2022-07-06 – 2022-07-09 (×4): 10 mg via ORAL
  Filled 2022-07-06 (×4): qty 1

## 2022-07-06 MED ORDER — TOPIRAMATE 100 MG PO TABS
100.0000 mg | ORAL_TABLET | Freq: Two times a day (BID) | ORAL | Status: DC
  Administered 2022-07-06 – 2022-07-09 (×6): 100 mg via ORAL
  Filled 2022-07-06 (×6): qty 1

## 2022-07-06 MED ORDER — LACTATED RINGERS IV SOLN: INTRAVENOUS | Status: DC

## 2022-07-06 MED ORDER — INSULIN GLARGINE-YFGN 100 UNIT/ML ~~LOC~~ SOLN
8.0000 [IU] | Freq: Every day | SUBCUTANEOUS | Status: DC
  Filled 2022-07-06: qty 0.08

## 2022-07-06 NOTE — Hospital Course (Signed)
Mariah Reed is a 77 y.o. 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  CT scan showed pneumoperitoneum centered around the cecum and proximal ascending colon.  Patient most likely had a perforated diverticulosis.  Patient is seen by general surgery, was placed on n.p.o., IV antibiotics.

## 2022-07-06 NOTE — Consult Note (Signed)
Pharmacy Antibiotic Note  Mariah Reed is a 77 y.o. female admitted on 07/05/2022 with  perforated diverticulitis .  PMH ulcerative colitis who presents with N/V and upper abdominal pain.  CTAP identifies probably perforation around cecum and proximal ascending colon as well as a reactive ileus around small bowel. Surgery has been consulted. Patient received Zosyn 3.375 g IV x 1 in the ED. Pharmacy has been consulted for Zosyn dosing.  Plan: Continue Zosyn (piperacillin/tazobactam) IV 3.375 grams every 8 hours (4 hour infusion) Follow up culture results to assess for antibiotic optimization Monitor renal function to assess for any necessary antibiotic dosing changes  Height: 5\' 5"  (165.1 cm) Weight: 85 kg (187 lb 6.3 oz) IBW/kg (Calculated) : 57  Temp (24hrs), Avg:98.3 F (36.8 C), Min:98 F (36.7 C), Max:98.6 F (37 C)  Recent Labs  Lab 07/05/22 0454 07/06/22 0507  WBC 12.7* 6.1  CREATININE 1.02* 0.81     Estimated Creatinine Clearance: 62.6 mL/min (by C-G formula based on SCr of 0.81 mg/dL).    Allergies  Allergen Reactions   Azithromycin Diarrhea   Levofloxacin Other (See Comments)    Made head feel weird   Nortriptyline Other (See Comments)    Caused rectal bleeding    Psyllium Other (See Comments)   Tape Other (See Comments)    Breaks out with Medical Tape     Antimicrobials this admission: Zosyn 5/30 >>   Dose adjustments this admission: N/A  Microbiology results: 5/30 BCx: NG < 24 hrs   Thank you for allowing pharmacy to be a part of this patient's care.  Elliot Gurney, PharmD, BCPS Clinical Pharmacist  07/06/2022 8:49 AM

## 2022-07-06 NOTE — Progress Notes (Signed)
Progress Note   Patient: Mariah Reed:[id number] DOB: January 17, 1946 DOA: 07/05/2022     1 DOS: the patient was seen and examined on 07/06/2022   Brief hospital course: KIRAT GIN is a 77 y.o. 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  CT scan showed pneumoperitoneum centered around the cecum and proximal ascending colon.  Patient most likely had a perforated diverticulosis.  Patient is seen by general surgery, was placed on n.p.o., IV antibiotics.   Principal Problem:   Colon perforation (HCC) Active Problems:   Essential hypertension   COPD (chronic obstructive pulmonary disease) (HCC)   Chronic diastolic CHF (congestive heart failure) (HCC)   Hypothyroidism   Hyperkalemia   HLD (hyperlipidemia)   CAD (coronary artery disease)   Type II diabetes mellitus with renal manifestations (HCC)   CKD (chronic kidney disease) stage 2, GFR 60-89 ml/min   Obesity (BMI 30-39.9)   Metabolic acidosis   Assessment and Plan:  Colon perforation [facility]):  Patient has been followed by general surgery, currently does not need surgery.  Most likely this is caused by perforated diverticulosis.  Continue Zosyn. N.p.o., continue fluids.  Chronic kidney disease stage II. Hyperkalemia. Metabolic acidosis. Patient renal function still within normal limits, potassium normalized.  Mild metabolic acidosis.  IV fluids changed to lactated ringer.  Biliary dilation. Patient had a history of cholecystectomy, CT scan showed diffuse biliary dilation, bilirubin only mildly elevated, clinically no obstruction.  Will monitor liver function panel.  Type II diabetes mellitus with renal manifestations [facility]): Recent A1c 9.4, poorly controlled.   Patient taking Tradjenta and glargine insulin 16 units twice daily Glucose not significant elevated, further decrease insulin glargine to 8 units daily.  Essential hypertension Continue metoprolol and nifedipine.   COPD (chronic obstructive pulmonary disease) (HCC): Stable    Chronic diastolic CHF (congestive heart failure) (HCC):  No volume overload, gentle rehydration at the patient NPO.    Hypothyroidism -Synthroid   HLD (hyperlipidemia): Patient is taking lovastatin at home -Pravastatin hospital   CAD (coronary artery disease): no chest pain -Pravastatin   Obesity (BMI 30-39.9): Body weight 89.1 kg, BMI 32.68 Diet and exercise advised.     Subjective: Patient doing well today, no abdominal pain or nausea vomiting.  Physical Exam: Vitals:   07/06/22 0024 07/06/22 0448 07/06/22 0500 07/06/22 0753  BP: (!) 132/54 (!) 116/55  (!) 122/59  Pulse: 63 79  81  Resp: 18 20  18   Temp: 98.4 F (36.9 C) 98.6 F (37 C)  98.4 F (36.9 C)  TempSrc: Oral Oral  Oral  SpO2: 96% 97%  99%  Weight:   85 kg   Height:       General exam: Appears calm and comfortable  Respiratory system: Clear to auscultation. Respiratory effort normal. Cardiovascular system: S1 & S2 heard, RRR. No JVD, murmurs, rubs, gallops or clicks. No pedal edema. Gastrointestinal system: Abdomen is nondistended, soft and nontender. No organomegaly or masses felt. Normal bowel sounds heard. Central nervous system: Alert and oriented. No focal neurological deficits. Extremities: Symmetric 5 x 5 power. Skin: No rashes, lesions or ulcers Psychiatry: Judgement and insight appear normal. Mood & affect appropriate.    Data Reviewed:  CT scan results lab results reviewed.  Family Communication: none  Disposition: Status is: Inpatient Remains inpatient appropriate  because: Severity of disease, IV treatment.     Time spent: 35 minutes  Author: Marrion Coy, MD 07/06/2022 11:56 AM  For on call review  [email or web address].

## 2022-07-06 NOTE — TOC CM/SW Note (Signed)
Transition of Care [facility]) - Inpatient Brief Assessment   Patient Details  Name: Mariah Reed MRN: [id number] Date of Birth: [birth year]  Transition of Care [facility]) CM/SW Contact:    Margarito Liner, LCSW Phone Number: 07/06/2022, 3:08 PM   Clinical Narrative: CSW reviewed chart. No TOC needs identified so far. CSW will continue to follow. Please place consult if any needs arise.  Transition of Care Asessment: Insurance and Status: Insurance coverage has been reviewed Patient has primary care physician: Yes Home environment has been reviewed: Apartment Prior level of function:: No concerns documented. Prior/Current Home Services: No current home services Social Determinants of Health Reivew: SDOH reviewed no interventions necessary Readmission risk has been reviewed: Yes Transition of care needs: no transition of care needs at this time

## 2022-07-06 NOTE — Progress Notes (Signed)
Subjective:  CC: Mariah Reed is a 77 y.o. female  Hospital stay day 1,   colon perforation  HPI: No acute issues overnight.  Does not report any pain or requiring pain meds.  No N/V.  ROS:  General: Denies weight loss, weight gain, fatigue, fevers, chills, and night sweats. Heart: Denies chest pain, palpitations, racing heart, irregular heartbeat, leg pain or swelling, and decreased activity tolerance. Respiratory: Denies breathing difficulty, shortness of breath, wheezing, cough, and sputum. GI: Denies change in appetite, heartburn, nausea, vomiting, constipation, diarrhea, and blood in stool. GU: Denies difficulty urinating, pain with urinating, urgency, frequency, blood in urine.   Objective:   Temp:  [98 F (36.7 C)-98.6 F (37 C)] 98.4 F (36.9 C) (05/31 0753) Pulse Rate:  [63-81] 81 (05/31 0753) Resp:  [18-20] 18 (05/31 0753) BP: (111-134)/(51-66) 122/59 (05/31 0753) SpO2:  [96 %-100 %] 99 % (05/31 0753) Weight:  [85 kg] 85 kg (05/31 0500)     Height: 5\' 5"  (165.1 cm) Weight: 85 kg BMI (Calculated): 31.18   Intake/Output this shift:   Intake/Output Summary (Last 24 hours) at 07/06/2022 1229 Last data filed at 07/06/2022 0300 Gross per 24 hour  Intake 637.8 ml  Output --  Net 637.8 ml    Constitutional :  alert, cooperative, appears stated age, and no distress  Respiratory:  clear to auscultation bilaterally  Cardiovascular:  regular rate and rhythm  Gastrointestinal: Soft, no guarding, minor tenderness to palpation right lower quadrant. .   Skin: Cool and moist.   Psychiatric: Normal affect, non-agitated, not confused       LABS:     Latest Ref Rng & Units 07/06/2022    5:07 AM 07/05/2022    1:15 PM 07/05/2022    4:54 AM  CMP  Glucose 70 - 99 mg/dL 161   096   BUN 8 - 23 mg/dL 22   24   Creatinine 0.45 - 1.00 mg/dL 4.09   8.11   Sodium 914 - 145 mmol/L 137   137   Potassium 3.5 - 5.1 mmol/L 4.2  4.1  5.3   Chloride 98 - 111 mmol/L 109   103   CO2 22 - 32  mmol/L 18   24   Calcium 8.9 - 10.3 mg/dL 8.2   9.5   Total Protein 6.5 - 8.1 g/dL   8.6   Total Bilirubin 0.3 - 1.2 mg/dL   1.5   Alkaline Phos 38 - 126 U/L   137   AST 15 - 41 U/L   31   ALT 0 - 44 U/L   36       Latest Ref Rng & Units 07/06/2022    5:07 AM 07/05/2022    4:54 AM 08/23/2021    3:55 PM  CBC  WBC 4.0 - 10.5 K/uL 6.1  12.7  6.6   Hemoglobin 12.0 - 15.0 g/dL 78.2  95.6  21.3   Hematocrit 36.0 - 46.0 % 46.3  53.4  45.1   Platelets 150 - 400 K/uL 149  198  169     RADS: N/a Assessment:   Colon perforation, presumed perforated diverticulitis.  Clinically remained stable and leukocytosis has resolved.  Advance to clear liquid diet and continue to monitor.  Continue IV antibiotics for intra-abdominal infection.  labs/images/medications/previous chart entries reviewed personally and relevant changes/updates noted above.

## 2022-07-07 DIAGNOSIS — K631 Perforation of intestine (nontraumatic): Secondary | ICD-10-CM | POA: Diagnosis not present

## 2022-07-07 DIAGNOSIS — K668 Other specified disorders of peritoneum: Secondary | ICD-10-CM | POA: Diagnosis not present

## 2022-07-07 DIAGNOSIS — Z794 Long term (current) use of insulin: Secondary | ICD-10-CM | POA: Diagnosis not present

## 2022-07-07 DIAGNOSIS — E1129 Type 2 diabetes mellitus with other diabetic kidney complication: Secondary | ICD-10-CM | POA: Diagnosis not present

## 2022-07-07 DIAGNOSIS — D696 Thrombocytopenia, unspecified: Secondary | ICD-10-CM

## 2022-07-07 DIAGNOSIS — I5032 Chronic diastolic (congestive) heart failure: Secondary | ICD-10-CM | POA: Diagnosis not present

## 2022-07-07 LAB — COMPREHENSIVE METABOLIC PANEL
ALT: 86 U/L — ABNORMAL HIGH (ref 0–44)
AST: 97 U/L — ABNORMAL HIGH (ref 15–41)
Albumin: 3.3 g/dL — ABNORMAL LOW (ref 3.5–5.0)
Alkaline Phosphatase: 167 U/L — ABNORMAL HIGH (ref 38–126)
Anion gap: 7 (ref 5–15)
BUN: 20 mg/dL (ref 8–23)
CO2: 24 mmol/L (ref 22–32)
Calcium: 8.2 mg/dL — ABNORMAL LOW (ref 8.9–10.3)
Chloride: 106 mmol/L (ref 98–111)
Creatinine, Ser: 0.86 mg/dL (ref 0.44–1.00)
GFR, Estimated: >60 mL/min (ref >60)
Glucose, Bld: 160 mg/dL — ABNORMAL HIGH (ref 70–99)
Potassium: 4.1 mmol/L (ref 3.5–5.1)
Sodium: 137 mmol/L (ref 135–145)
Total Bilirubin: 1 mg/dL (ref 0.3–1.2)
Total Protein: 6.1 g/dL — ABNORMAL LOW (ref 6.5–8.1)

## 2022-07-07 LAB — GLUCOSE, CAPILLARY
Glucose-Capillary: 157 mg/dL — ABNORMAL HIGH (ref 70–99)
Glucose-Capillary: 276 mg/dL — ABNORMAL HIGH (ref 70–99)
Glucose-Capillary: 220 mg/dL — ABNORMAL HIGH (ref 70–99)
Glucose-Capillary: 206 mg/dL — ABNORMAL HIGH (ref 70–99)

## 2022-07-07 LAB — CBC
HCT: 44.3 % (ref 36.0–46.0)
Hemoglobin: 13.7 g/dL (ref 12.0–15.0)
MCH: 27.8 pg (ref 26.0–34.0)
MCHC: 30.9 g/dL (ref 30.0–36.0)
MCV: 90 fL (ref 80.0–100.0)
Platelets: 172 10*3/uL (ref 150–400)
RBC: 4.92 MIL/uL (ref 3.87–5.11)
RDW: 14 % (ref 11.5–15.5)
WBC: 5.9 10*3/uL (ref 4.0–10.5)
nRBC: 0 % (ref 0.0–0.2)

## 2022-07-07 LAB — CULTURE, BLOOD (ROUTINE X 2): Special Requests: ADEQUATE

## 2022-07-07 LAB — MAGNESIUM: Magnesium: 2.3 mg/dL (ref 1.7–2.4)

## 2022-07-07 LAB — PHOSPHORUS: Phosphorus: 2.7 mg/dL (ref 2.5–4.6)

## 2022-07-07 MED ORDER — SODIUM CHLORIDE 0.9 % IV SOLN: INTRAVENOUS | Status: DC | PRN

## 2022-07-07 MED ORDER — INSULIN GLARGINE-YFGN 100 UNIT/ML ~~LOC~~ SOLN
16.0000 [IU] | Freq: Every day | SUBCUTANEOUS | Status: DC
  Administered 2022-07-07: 16 [IU] via SUBCUTANEOUS
  Filled 2022-07-07 (×2): qty 0.16

## 2022-07-07 NOTE — Progress Notes (Signed)
Subjective:  CC: Mariah Reed is a 77 y.o. female  Hospital stay day 2,   colon perforation  HPI: No acute issues overnight.  Does not report any pain or requiring pain meds.  No N/V.  ROS:  General: Denies weight loss, weight gain, fatigue, fevers, chills, and night sweats. Heart: Denies chest pain, palpitations, racing heart, irregular heartbeat, leg pain or swelling, and decreased activity tolerance. Respiratory: Denies breathing difficulty, shortness of breath, wheezing, cough, and sputum. GI: Discussed at length the history of using Imodium frequently in order to control chronic diarrhea. GU: Denies difficulty urinating, pain with urinating, urgency, frequency, blood in urine.   Objective:   Temp:  [98.1 F (36.7 C)-98.4 F (36.9 C)] 98.4 F (36.9 C) (06/01 0447) Pulse Rate:  [59-83] 59 (06/01 0447) Resp:  [18-20] 18 (06/01 0447) BP: (112-126)/(47-79) 116/72 (06/01 0447) SpO2:  [95 %-99 %] 97 % (06/01 0447)     Height: 5\' 5"  (165.1 cm) Weight: 85 kg BMI (Calculated): 31.18   Intake/Output this shift:   Intake/Output Summary (Last 24 hours) at 07/07/2022 0727 Last data filed at 07/07/2022 0047 Gross per 24 hour  Intake 843.14 ml  Output --  Net 843.14 ml     Constitutional :  alert, cooperative, appears stated age, and no distress  Respiratory:  clear to auscultation bilaterally  Cardiovascular:  regular rate and rhythm  Gastrointestinal: Soft, no guarding, no appreciable tenderness to palpation right lower quadrant. .   Skin: Cool and moist.   Psychiatric: Normal affect, non-agitated, not confused       LABS:     Latest Ref Rng & Units 07/07/2022    4:29 AM 07/06/2022    5:07 AM 07/05/2022    1:15 PM  CMP  Glucose 70 - 99 mg/dL 161  096    BUN 8 - 23 mg/dL 20  22    Creatinine 0.45 - 1.00 mg/dL 4.09  8.11    Sodium 914 - 145 mmol/L 137  137    Potassium 3.5 - 5.1 mmol/L 4.1  4.2  4.1   Chloride 98 - 111 mmol/L 106  109    CO2 22 - 32 mmol/L 24  18    Calcium  8.9 - 10.3 mg/dL 8.2  8.2    Total Protein 6.5 - 8.1 g/dL 6.1     Total Bilirubin 0.3 - 1.2 mg/dL 1.0     Alkaline Phos 38 - 126 U/L 167     AST 15 - 41 U/L 97     ALT 0 - 44 U/L 86         Latest Ref Rng & Units 07/07/2022    4:29 AM 07/06/2022    5:07 AM 07/05/2022    4:54 AM  CBC  WBC 4.0 - 10.5 K/uL 5.9  6.1  12.7   Hemoglobin 12.0 - 15.0 g/dL 78.2  95.6  21.3   Hematocrit 36.0 - 46.0 % 44.3  46.3  53.4   Platelets 150 - 400 K/uL 172  149  198     RADS: N/a Assessment:   Colon perforation, presumed perforated diverticulitis.  Clinically improving and leukocytosis remains  resolved.  Continue clear liquid diet and continue to monitor.  Continue IV antibiotics for intra-abdominal infection.    labs/images/medications/previous chart entries reviewed personally and relevant changes/updates noted above.

## 2022-07-07 NOTE — Progress Notes (Signed)
Progress Note   Patient: Mariah Reed:[id number] DOB: Nov 24, 1945 DOA: 07/05/2022     2 DOS: the patient was seen and examined on 07/07/2022   Brief hospital course: [name] is a 77 y.o. 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  CT scan showed pneumoperitoneum centered around the cecum and proximal ascending colon.  Patient most likely had a perforated diverticulosis.  Patient is seen by general surgery, was placed on n.p.o., IV antibiotics.  Changed to liquid diet 5/31.    Principal Problem:   Colon perforation (HCC) Active Problems:   Essential hypertension   COPD (chronic obstructive pulmonary disease) (HCC)   Chronic diastolic CHF (congestive heart failure) (HCC)   Hypothyroidism   Hyperkalemia   HLD (hyperlipidemia)   CAD (coronary artery disease)   Type II diabetes mellitus with renal manifestations (HCC)   CKD (chronic kidney disease) stage 2, GFR 60-89 ml/min   Obesity (BMI 30-39.9)   Metabolic acidosis   Pneumoperitoneum   Assessment and Plan: Colon perforation [facility]):  Patient has been followed by general surgery, currently does not need surgery.  Most likely this is caused by perforated diverticulosis.  Continue Zosyn. Patient is hemodynamically stable, tolerated a liquid diet, will discontinue IV fluids.   Chronic kidney disease stage II. Hyperkalemia. Metabolic acidosis. Condition improved, discontinue IV fluids.   Biliary dilation. Patient had a history of cholecystectomy, CT scan showed diffuse biliary dilation, bilirubin only mildly elevated, clinically no obstruction.  Bilirubin repeated at 1.0.  Type II diabetes mellitus with renal manifestations [facility]): Recent A1c 9.4,   Glucose running high, increase insulin glargine dose.   Essential hypertension Continue metoprolol and nifedipine.   COPD (chronic obstructive pulmonary disease) (HCC): Stable     Chronic diastolic CHF (congestive heart failure) (HCC):  No volume overload, gentle rehydration at the patient NPO.     Hypothyroidism -Synthroid   HLD (hyperlipidemia): Patient is taking [facility]astatin at home [facility]   CAD (coronary artery disease): no chest pain -Pravastatin   Obesity (BMI 30-39.9): Body weight 89.1 kg, BMI 32.68 Diet and exercise advised.       Subjective:  Patient doing better today, tolerating liquid diet without nausea vomiting.  Physical Exam: Vitals:   07/06/22 1555 07/06/22 2105 07/07/22 0447 07/07/22 0802  BP: (!) 112/47 126/79 116/72 134/70  Pulse: 83 64 (!) 59 66  Resp: 18 20 18 18   Temp: 98.1 F (36.7 C) 98.4 F (36.9 C) 98.4 F (36.9 C) 98.5 F (36.9 C)  TempSrc:  Oral Oral Oral  SpO2: 95% 96% 97% 97%  Weight:      Height:       General exam: Appears calm and comfortable  Respiratory system: Clear to auscultation. Respiratory effort normal. Cardiovascular system: S1 & S2 heard, RRR. No JVD, murmurs, rubs, gallops or clicks. No pedal edema. Gastrointestinal system: Abdomen is nondistended, soft and nontender. No organomegaly or masses felt. Normal bowel sounds heard. Central nervous system: Alert and oriented. No focal neurological deficits. Extremities: Symmetric 5 x 5 power. Skin: No rashes, lesions or ulcers Psychiatry: Judgement and insight appear normal. Mood & affect appropriate.    Data Reviewed:  Lab results reviewed.  Family Communication: None  Disposition: Status is: Inpatient Remains inpatient appropriate because: Severity of disease, IV treatment.  Time spent: 35 minutes  Author: Marrion Coy, MD 07/07/2022 12:29 PM  For on call review [email or web address].

## 2022-07-08 DIAGNOSIS — I5032 Chronic diastolic (congestive) heart failure: Secondary | ICD-10-CM | POA: Diagnosis not present

## 2022-07-08 DIAGNOSIS — E1129 Type 2 diabetes mellitus with other diabetic kidney complication: Secondary | ICD-10-CM | POA: Diagnosis not present

## 2022-07-08 DIAGNOSIS — Z794 Long term (current) use of insulin: Secondary | ICD-10-CM | POA: Diagnosis not present

## 2022-07-08 DIAGNOSIS — K631 Perforation of intestine (nontraumatic): Secondary | ICD-10-CM | POA: Diagnosis not present

## 2022-07-08 DIAGNOSIS — K668 Other specified disorders of peritoneum: Secondary | ICD-10-CM | POA: Diagnosis not present

## 2022-07-08 LAB — GLUCOSE, CAPILLARY
Glucose-Capillary: 154 mg/dL — ABNORMAL HIGH (ref 70–99)
Glucose-Capillary: 242 mg/dL — ABNORMAL HIGH (ref 70–99)
Glucose-Capillary: 187 mg/dL — ABNORMAL HIGH (ref 70–99)
Glucose-Capillary: 221 mg/dL — ABNORMAL HIGH (ref 70–99)

## 2022-07-08 MED ORDER — LACTULOSE 10 GM/15ML PO SOLN
20.0000 g | Freq: Once | ORAL | Status: DC
  Filled 2022-07-08: qty 30

## 2022-07-08 MED ORDER — POLYETHYLENE GLYCOL 3350 17 G PO PACK: 17.0000 g | PACK | Freq: Once | ORAL | Status: DC

## 2022-07-08 MED ORDER — BISACODYL 10 MG RE SUPP
10.0000 mg | Freq: Every day | RECTAL | Status: DC | PRN
  Administered 2022-07-08: 10 mg via RECTAL
  Filled 2022-07-08: qty 1

## 2022-07-08 MED ORDER — OXYCODONE-ACETAMINOPHEN 5-325 MG PO TABS
1.0000 | ORAL_TABLET | ORAL | Status: DC | PRN
  Administered 2022-07-08 – 2022-07-09 (×3): 2 via ORAL
  Filled 2022-07-08 (×3): qty 2

## 2022-07-08 MED ORDER — INSULIN GLARGINE-YFGN 100 UNIT/ML ~~LOC~~ SOLN
20.0000 [IU] | Freq: Every day | SUBCUTANEOUS | Status: DC
  Administered 2022-07-08 – 2022-07-09 (×2): 20 [IU] via SUBCUTANEOUS
  Filled 2022-07-08 (×2): qty 0.2

## 2022-07-08 NOTE — Progress Notes (Signed)
Progress Note   Patient: Mariah Reed ZOX:[id number] DOB: 02/05/1946 DOA: 07/05/2022     3 DOS: the patient was seen and examined on 07/08/2022   Brief hospital course: LIANDRA UNDERBERG is a 77 y.o. 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  CT scan showed pneumoperitoneum centered around the cecum and proximal ascending colon.  Patient most likely had a perforated diverticulosis.  Patient is seen by general surgery, was placed on n.p.o., IV antibiotics.  Changed to liquid diet 5/31.    Principal Problem:   Colon perforation (HCC) Active Problems:   Essential hypertension   COPD (chronic obstructive pulmonary disease) (HCC)   Chronic diastolic CHF (congestive heart failure) (HCC)   Hypothyroidism   Hyperkalemia   HLD (hyperlipidemia)   CAD (coronary artery disease)   Type II diabetes mellitus with renal manifestations (HCC)   CKD (chronic kidney disease) stage 2, GFR 60-89 ml/min   Obesity (BMI 30-39.9)   Metabolic acidosis   Pneumoperitoneum   Assessment and Plan: Colon perforation (HCC):  Chronic diarrhea. Most likely this is caused by perforated diverticulosis.  Continue Zosyn. Patient is followed by general surgery, condition seem to be improving. Currently alert tolerating liquid diet, wants to advance diet.  Will leave that to general surgery. Patient has a chronic diarrhea, has not had a bowel movement for the last 2 days, but I could not convince patient to take a stool softener.  Will try a suppository. I will consider start pancreatic enzyme if patient has diarrhea again. He has been followed with GI in the past for chronic diarrhea,.   Chronic kidney disease stage II. Hyperkalemia. Metabolic  acidosis. Conditions are improved.   Biliary dilation. Patient had a history of cholecystectomy, CT scan showed diffuse biliary dilation, bilirubin only mildly elevated, clinically no obstruction.  Bilirubin repeated at 1.0.   Type II diabetes mellitus with renal manifestations [facility]): Recent A1c 9.4,  Increased insulin glargine further today.   Essential hypertension Continue metoprolol and nifedipine.   COPD (chronic obstructive pulmonary disease) (HCC): Stable     Chronic diastolic CHF (congestive heart failure) (HCC):  Stable, no volume overload Fluids discontinued.     Hypothyroidism -Synthroid   HLD (hyperlipidemia): Patient is taking [facility]astatin at home -[facility]   CAD (coronary artery disease): no chest pain -Pravastatin   Obesity (BMI 30-39.9): Body weight 89.1 kg, BMI 32.68 Diet and exercise advised.           Subjective:  Patient feels well today, she feels hungry, wants to advance diet.  Physical Exam: Vitals:   07/07/22 1631 07/07/22 2041 07/08/22 0542 07/08/22 0806  BP: 112/62 115/61 135/72 136/64  Pulse: (!) 56 62 70 65  Resp: 18 18 18 18   Temp: 98.4 F (36.9 C) 98 F (36.7 C) 97.7 F (36.5 C) 98.3 F (36.8 C)  TempSrc: Oral Oral Oral   SpO2: 99% 98% 97% 98%  Weight:   86.6 kg   Height:       General exam: Appears calm and comfortable  Respiratory system: Clear to auscultation. Respiratory effort normal. Cardiovascular system: S1 & S2 heard, RRR. No JVD, murmurs, rubs, gallops or clicks. No pedal edema. Gastrointestinal system: Abdomen is nondistended, soft and nontender. No organomegaly or masses felt. Normal bowel sounds heard. Central nervous system: Alert  and oriented. No focal neurological deficits. Extremities: Symmetric 5 x 5 power. Skin: No rashes, lesions or ulcers Psychiatry: Judgement and insight appear normal. Mood & affect appropriate.    Data Reviewed:  Lab results reviewed.  Family Communication:  None  Disposition: Status is: Inpatient Remains inpatient appropriate because: Severity of disease, IV antibiotics.     Time spent: 50 minutes  Author: Marrion Coy, MD 07/08/2022 10:25 AM  For on call review [email or web address].

## 2022-07-08 NOTE — Progress Notes (Signed)
Subjective:  CC: Mariah Reed is a 77 y.o. female  Hospital stay day 3,   colon perforation  HPI: No acute issues overnight.  Does not report any pain or requiring pain meds.  No N/V.  ROS:  General: Denies weight loss, weight gain, fatigue, fevers, chills, and night sweats. Heart: Denies chest pain, palpitations, racing heart, irregular heartbeat, leg pain or swelling, and decreased activity tolerance. Respiratory: Denies breathing difficulty, shortness of breath, wheezing, cough, and sputum. GI: Discussed again the history of using Imodium frequently in order to control chronic diarrhea. GU: Denies difficulty urinating, pain with urinating, urgency, frequency, blood in urine.   Objective:   Temp:  [97.7 F (36.5 C)-98.4 F (36.9 C)] 98.3 F (36.8 C) (06/02 0806) Pulse Rate:  [56-70] 65 (06/02 0806) Resp:  [18] 18 (06/02 0806) BP: (112-136)/(61-72) 136/64 (06/02 0806) SpO2:  [97 %-99 %] 98 % (06/02 0806) Weight:  [86.6 kg] 86.6 kg (06/02 0542)     Height: 5\' 5"  (165.1 cm) Weight: 86.6 kg BMI (Calculated): 31.77   Intake/Output this shift:   Intake/Output Summary (Last 24 hours) at 07/08/2022 1128 Last data filed at 07/08/2022 1035 Gross per 24 hour  Intake 996.48 ml  Output 0 ml  Net 996.48 ml     Constitutional :  alert, cooperative, appears stated age, and no distress  Respiratory:  clear to auscultation bilaterally  Cardiovascular:  regular rate and rhythm  Gastrointestinal: Soft, no guarding, no appreciable tenderness to palpation right lower quadrant. .   Skin: Cool and moist.   Psychiatric: Normal affect, non-agitated, not confused       LABS:     Latest Ref Rng & Units 07/07/2022    4:29 AM 07/06/2022    5:07 AM 07/05/2022    1:15 PM  CMP  Glucose 70 - 99 mg/dL 161  096    BUN 8 - 23 mg/dL 20  22    Creatinine 0.45 - 1.00 mg/dL 4.09  8.11    Sodium 914 - 145 mmol/L 137  137    Potassium 3.5 - 5.1 mmol/L 4.1  4.2  4.1   Chloride 98 - 111 mmol/L 106  109     CO2 22 - 32 mmol/L 24  18    Calcium 8.9 - 10.3 mg/dL 8.2  8.2    Total Protein 6.5 - 8.1 g/dL 6.1     Total Bilirubin 0.3 - 1.2 mg/dL 1.0     Alkaline Phos 38 - 126 U/L 167     AST 15 - 41 U/L 97     ALT 0 - 44 U/L 86         Latest Ref Rng & Units 07/07/2022    4:29 AM 07/06/2022    5:07 AM 07/05/2022    4:54 AM  CBC  WBC 4.0 - 10.5 K/uL 5.9  6.1  12.7   Hemoglobin 12.0 - 15.0 g/dL 78.2  95.6  21.3   Hematocrit 36.0 - 46.0 % 44.3  46.3  53.4   Platelets 150 - 400 K/uL 172  149  198     RADS: N/a Assessment:   Colon perforation, presumed perforated diverticulitis.  Clinically improving and leukocytosis remains  resolved.  Advance to full diet and continue to monitor.  Continue IV antibiotics for intra-abdominal infection.    labs/images/medications/previous chart entries reviewed personally and relevant changes/updates noted above.

## 2022-07-09 ENCOUNTER — Other Ambulatory Visit: Payer: Self-pay | Admitting: Internal Medicine

## 2022-07-09 ENCOUNTER — Inpatient Hospital Stay: Payer: Medicare Other

## 2022-07-09 DIAGNOSIS — K529 Noninfective gastroenteritis and colitis, unspecified: Secondary | ICD-10-CM | POA: Diagnosis not present

## 2022-07-09 DIAGNOSIS — I5032 Chronic diastolic (congestive) heart failure: Secondary | ICD-10-CM | POA: Diagnosis not present

## 2022-07-09 DIAGNOSIS — K631 Perforation of intestine (nontraumatic): Secondary | ICD-10-CM | POA: Diagnosis not present

## 2022-07-09 LAB — CULTURE, BLOOD (ROUTINE X 2): Culture: NO GROWTH

## 2022-07-09 LAB — GLUCOSE, CAPILLARY
Glucose-Capillary: 123 mg/dL — ABNORMAL HIGH (ref 70–99)
Glucose-Capillary: 208 mg/dL — ABNORMAL HIGH (ref 70–99)

## 2022-07-09 MED ORDER — IOHEXOL 300 MG/ML  SOLN
100.0000 mL | Freq: Once | INTRAMUSCULAR | Status: AC | PRN
  Administered 2022-07-09: 100 mL via INTRAVENOUS

## 2022-07-09 MED ORDER — AMOXICILLIN 875 MG PO TABS: 875.0000 mg | ORAL_TABLET | Freq: Two times a day (BID) | ORAL | 0 refills | Status: AC

## 2022-07-09 MED ORDER — IOHEXOL 9 MG/ML PO SOLN
500.0000 mL | ORAL | Status: AC
  Administered 2022-07-09 (×2): 500 mL via ORAL

## 2022-07-09 MED ORDER — ZENPEP 10000-32000 UNITS PO CPEP: 1.0000 | ORAL_CAPSULE | Freq: Three times a day (TID) | ORAL | 0 refills | Status: AC

## 2022-07-09 NOTE — Evaluation (Signed)
Physical Therapy Evaluation Patient Details Name: Mariah Reed MRN: [id number] DOB: 1945/10/04 Today's Date: 07/09/2022  History of Present Illness  presented to ER secondary to nausea/vomiting, abdominal pain; admitted for management of acute colon perforation (likely related to perforated diverticulitis), managed conservatively.  Clinical Impression  Patient resting in bed upon arrival to session; alert and oriented, follows commands and agreeable to participation with session.  Denies pain; does endorse indep mobilization in room throughout hospital stay.  Noted with L UE ROM deficits (related to baseline deformity/functional deficit), unchanged with this admission; otherwise, UE and bilat LE strength and ROM grossly WFL for basic transfers and mobility.  Able to complete bed mobility indep; sit/stand, basic transfers and gait (220') without assist device, mod indep/indep.  Demonstrates reciprocal stepping pattern with fair step height/length; decreased cadence and gait speed (10' walk time, 12-13 seconds), but no overt buckling or LOB. Completes head turns, obstacle negotiation and change of direction without safety concern. Endorses performance is at/near baseline for her    No acute PT needs indicated.  Patient in agreement.  Will complete initial orders; please re-consult should needs change.     Recommendations for follow up therapy are one component of a multi-disciplinary discharge planning process, led by the attending physician.  Recommendations may be updated based on patient status, additional functional criteria and insurance authorization.  Follow Up Recommendations       Assistance Recommended at Discharge    Patient can return home with the following       Equipment Recommendations    Recommendations for Other Services       Functional Status Assessment Patient has not had a recent decline in their functional status     Precautions / Restrictions  Precautions Precautions: None Restrictions Weight Bearing Restrictions: No      Mobility  Bed Mobility Overal bed mobility: Independent                  Transfers Overall transfer level: Modified independent Equipment used: None                    Ambulation/Gait Ambulation/Gait assistance: Modified independent (Device/Increase time) Gait Distance (Feet): 220 Feet Assistive device: None   Gait velocity: 10' walk time, 12-13 seconds     General Gait Details: reciprocal stepping pattern with fair step height/length; decreased cadence and gait speed, but no overt buckling or LOB.  Completes head turns, obstacle negotiation and change of direction without safety concern.  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[employer] (Stroke Patients Only)       Balance Overall balance assessment: Modified Independent                                           Pertinent Vitals/Pain Pain Assessment Pain Assessment: No/denies pain    Home Living Family/patient expects to be discharged to:: Private residence Living Arrangements: Alone   Type of Home: Apartment         Home Layout: One level Home Equipment: Cane - single point      Prior Function Prior Level of Function : Independent/Modified Independent;Driving             Mobility Comments: Mod indep for ADLs, household and community mobilization; intermittent use of SPC  for mobility efforts.  Denies fall history       Hand Dominance   Dominant Hand: Right    Extremity/Trunk Assessment   Upper Extremity Assessment Upper Extremity Assessment: Overall WFL for tasks assessed (baseline L UE deformity/functional deficits)    Lower Extremity Assessment Lower Extremity Assessment: Overall WFL for tasks assessed (grossly at least 4/5 throughout; baseline neuropathy knees distally)       Communication   Communication:  No difficulties  Cognition Arousal/Alertness: Awake/alert Behavior During Therapy: WFL for tasks assessed/performed Overall Cognitive Status: Within Functional Limits for tasks assessed                                          General Comments      Exercises     Assessment/Plan    PT Assessment Patient does not need any further PT services  PT Problem List Decreased mobility;Decreased activity tolerance;Decreased balance       PT Treatment Interventions Gait training;Functional mobility training;Therapeutic activities;Balance training;Patient/family education    PT Goals (Current goals can be found in the Care Plan section)  Acute Rehab PT Goals Patient Stated Goal: to return home PT Goal Formulation: All assessment and education complete, DC therapy Time For Goal Achievement: 07/09/22 Potential to Achieve Goals: Good    Frequency       Co-evaluation               AM-PAC PT "6 Clicks" Mobility  Outcome Measure Help needed turning from your back to your side while in a flat bed without using bedrails?: None Help needed moving from lying on your back to sitting on the side of a flat bed without using bedrails?: None Help needed moving to and from a bed to a chair (including a wheelchair)?: None Help needed standing up from a chair using your arms (e.g., wheelchair or bedside chair)?: None Help needed to walk in hospital room?: None Help needed climbing 3-5 steps with a railing? : None 6 Click Score: 24    End of Session Equipment Utilized During Treatment: Gait belt Activity Tolerance: Patient tolerated treatment well Patient left: in bed;with call bell/phone within reach Nurse Communication: Mobility status PT Visit Diagnosis: Muscle weakness (generalized) (M62.81)    Time: 1610-9604 PT Time Calculation (min) (ACUTE ONLY): 13 min   Charges:   PT Evaluation $PT Eval Low Complexity: 1 Low          Teejay Meader H. Manson Passey, PT, DPT,  NCS 07/09/22, 9:49 AM [phone]

## 2022-07-09 NOTE — Discharge Summary (Signed)
Physician Discharge Summary   Patient: Mariah Reed MRN: [id number] DOB: [birth year]  Admit date:     07/05/2022  Discharge date: 07/09/22  Discharge Physician: Marrion Coy   PCP: Dorothey Baseman, MD   Recommendations at discharge:   Follow-up with PCP in 1 week. Follow-up with general surgery in 1 week.  Discharge Diagnoses: Principal Problem:   Colon perforation (HCC) Active Problems:   Essential hypertension   COPD (chronic obstructive pulmonary disease) (HCC)   Chronic diastolic CHF (congestive heart failure) (HCC)   Hypothyroidism   Hyperkalemia   HLD (hyperlipidemia)   CAD (coronary artery disease)   Type II diabetes mellitus with renal manifestations (HCC)   CKD (chronic kidney disease) stage 2, GFR 60-89 ml/min   Obesity (BMI 30-39.9)   Chronic diarrhea   Metabolic acidosis   Pneumoperitoneum  Resolved Problems:   Thrombocytopenia [facility])  Hospital Course: Mariah Reed is a 77 y.o. 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  CT scan showed pneumoperitoneum centered around the cecum and proximal ascending colon.  Patient most likely had a perforated diverticulosis.  Patient is seen by general surgery, was placed on n.p.o., IV antibiotics.  Changed to liquid diet 5/31.  Patient condition continued to improve, diet changed to soft 6/2, repeat CT scan on 6/3 much improved.  Currently medically stable to be discharged. Assessment and Plan:  Colon perforation [facility]):  Chronic diarrhea. Most likely this is caused by perforated diverticulosis.  Treated with Zosyn. Patient is followed by general surgery, condition, CT scan repeated, showed much improvement in free air.  Discussed with general surgery,  medically stable to be discharged.  Patient so far has tolerated soft diet.  Continue oral antibiotics to complete a 10-day course of treatment. Patient has a chronic diarrhea, suspect malabsorption.  Started pancreatic enzyme.  Follow-up with PCP as outpatient.   Chronic kidney disease stage II. Hyperkalemia. Metabolic acidosis. Conditions are improved.   Biliary dilation. Patient had a history of cholecystectomy, CT scan showed diffuse biliary dilation, bilirubin only mildly elevated, clinically no obstruction.  Bilirubin repeated at 1.0.   Type II diabetes mellitus with renal manifestations [facility]): Recent A1c 9.4,  Resume home treatment, follow-up with PCP as outpatient.   Essential hypertension Continue metoprolol and nifedipine.   COPD (chronic obstructive pulmonary disease) (HCC): Stable     Chronic diastolic CHF (congestive heart failure) (HCC):  Stable, no volume overload Fluids discontinued.     Hypothyroidism -Synthroid   HLD (hyperlipidemia): Patient is taking lovastatin at home -[facility]ravastatin hospital   CAD (coronary artery disease): no chest pain -Pravastatin   Obesity (BMI 30-39.9): Body weight 89.1 kg, BMI 32.68 Diet and exercise advised.       Consultants: General surgery. Procedures performed: None  Disposition: Home Diet recommendation:  Discharge Diet Orders (From admission, onward)     Start     Ordered   07/09/22 0000  Diet - low sodium heart healthy        07/09/22 1239           Cardiac diet DISCHARGE MEDICATION: Allergies as of 07/09/2022       Reactions   Azithromycin Diarrhea   Levofloxacin Other (See Comments)   Made head feel weird   Nortriptyline Other (See Comments)   Caused rectal bleeding  Psyllium Other (See Comments)   Tape Other (See Comments)   Breaks out with Medical Tape         Medication List     STOP taking these medications    alum & mag hydroxide-simeth 200-200-20 MG/5ML suspension Commonly known as:  MAALOX/MYLANTA   diphenoxylate-atropine 2.5-0.025 MG tablet Commonly known as: LOMOTIL   famotidine 20 MG tablet Commonly known as: Pepcid   fluconazole 150 MG tablet Commonly known as: DIFLUCAN   spironolactone 50 MG tablet Commonly known as: ALDACTONE       TAKE these medications    Accu-Chek FastClix Lancets Misc 1 Device by Does not apply route 3 (three) times daily. Use to check blood sugar up to three times a day   albuterol 108 (90 Base) MCG/ACT inhaler Commonly known as: VENTOLIN HFA INHALE 2 INHALATIONS INTO THE LUNGS EVERY 4 HOURS AS NEEDED FOR WHEEZING   amoxicillin 875 MG tablet Commonly known as: AMOXIL Take 1 tablet (875 mg total) by mouth 2 (two) times daily for 5 days.   Basaglar KwikPen 100 UNIT/ML Inject 8 Units into the skin 2 (two) times daily.   chlorpheniramine 4 MG tablet Commonly known as: CHLOR-TRIMETON Take 4 mg by mouth 2 (two) times daily as needed for allergies.   diclofenac 1.3 % Ptch Commonly known as: Flector APPLY PATCH TO THE MOST PAINFUL AREA ONCE OR TWICE DAILY   diclofenac sodium 1 % Gel Commonly known as: VOLTAREN APPLY 2 GRAMS TOPICALLY 4 TIMES DAILY   DULoxetine 20 MG capsule Commonly known as: CYMBALTA Take 20 mg by mouth daily.   esomeprazole 40 MG capsule Commonly known as: NEXIUM TAKE 1 CAPSULE BY MOUTH ONCE DAILY   fluticasone 50 MCG/ACT nasal spray Commonly known as: FLONASE PLACE 2 SPRAYS INTO BOTH NOSTRILS ONCE DAILY   glipiZIDE 10 MG tablet Commonly known as: GLUCOTROL TAKE 1 TABLET BY MOUTH TWICE A DAY BEFORE MEALS   hydrOXYzine 25 MG capsule Commonly known as: VISTARIL Take 1 capsule (25 mg total) by mouth every 8 (eight) hours as needed.   Jardiance 25 MG Tabs tablet Generic drug: empagliflozin TAKE 1 TABLET BY MOUTH ONCE DAILY   levothyroxine 75 MCG tablet Commonly known as: SYNTHROID TAKE 1 TABLET BY MOUTH ONCE A DAY. TAKE ON AN EMPTY STOMACH WITH A GLASS OF WATER ATLEAST 30-60 MIN BEFORE  BREAKFAST   loperamide 2 MG capsule Commonly known as: IMODIUM Take 8 mg by mouth 2 (two) times daily. Take with lomotil   losartan 50 MG tablet Commonly known as: COZAAR Take 50 mg by mouth daily.   lovastatin 40 MG tablet Commonly known as: MEVACOR Take 1 tablet (40 mg total) by mouth 2 (two) times daily. Please call [phone] to schedule yearly appointment for further refills. Thank you.   metoprolol tartrate 25 MG tablet Commonly known as: LOPRESSOR TAKE 1 TABLET BY MOUTH TWICE A DAY   NIFEdipine 30 MG 24 hr tablet Commonly known as: ADALAT CC Take 1 tablet (30 mg total) by mouth daily.   nitroGLYCERIN 0.4 MG SL tablet Commonly known as: NITROSTAT Take SL prn chest pain/May repeat 5 min apart up to 3 times prn   nystatin cream Commonly known as: MYCOSTATIN APPLY TO AFFECTED AREAS ONCE DAILY AS NEEDED   pregabalin 25 MG capsule Commonly known as: LYRICA Take 1 capsule (25 mg total) by mouth 2 (two) times daily.   tiotropium 18 MCG inhalation capsule Commonly known as: Spiriva HandiHaler PLACE 1 CAPSULE INTO INHALER AND INHALE ONCE  DAILY   tiZANidine 4 MG tablet Commonly known as: ZANAFLEX TAKE 1 TABLET BY MOUTH 2 TIMES DAILY AS NEEDED   topiramate 100 MG tablet Commonly known as: TOPAMAX Take 2 tablets (200 mg total) by mouth daily.   Tradjenta 5 MG Tabs tablet Generic drug: linagliptin TAKE 1 TABLET BY MOUTH ONCE DAILY   triamcinolone ointment 0.1 % Commonly known as: KENALOG Apply 1 Application topically 2 (two) times daily.   Zenpep 10000-32000 units Cpep Generic drug: Pancrelipase (Lip-Prot-Amyl) Take 1 capsule by mouth 3 (three) times daily before meals.   zolpidem 5 MG tablet Commonly known as: AMBIEN Take 1 tablet (5 mg total) by mouth at bedtime as needed. for sleep        Follow-up Information     Dorothey Baseman, MD Follow up in 1 week(s).   Specialty: Family Medicine Contact information: [address] [place]  [address] [phone]         Sung Amabile, DO Follow up in 1 week(s).   Specialties: General Surgery, Surgery Contact information: [address] [place] [address] [phone]                Discharge Exam: Ceasar Mons Weights   07/06/22 0500 07/08/22 0542 07/09/22 0457  Weight: 85 kg 86.6 kg 86 kg   General exam: Appears calm and comfortable  Respiratory system: Clear to auscultation. Respiratory effort normal. Cardiovascular system: S1 & S2 heard, RRR. No JVD, murmurs, rubs, gallops or clicks. No pedal edema. Gastrointestinal system: Abdomen is nondistended, soft and nontender. No organomegaly or masses felt. Normal bowel sounds heard. Central nervous system: Alert and oriented. No focal neurological deficits. Extremities: Symmetric 5 x 5 power. Skin: No rashes, lesions or ulcers Psychiatry: Judgement and insight appear normal. Mood & affect appropriate.    Condition at discharge: good  The results of significant diagnostics from this hospitalization (including imaging, microbiology, ancillary and laboratory) are listed below for reference.   Imaging Studies: CT ABDOMEN PELVIS W CONTRAST  Result Date: 07/09/2022 CLINICAL DATA:  Diverticulitis. EXAM: CT ABDOMEN AND PELVIS WITH CONTRAST TECHNIQUE: Multidetector CT imaging of the abdomen and pelvis was performed using the standard protocol following bolus administration of intravenous contrast. RADIATION DOSE REDUCTION: This exam was performed according to the departmental dose-optimization program which includes automated exposure control, adjustment of the mA and/or kV according to patient size and/or use of iterative reconstruction technique. CONTRAST:  OMNIPAQUE IOHEXOL 300 MG/ML  SOLN COMPARISON:  Jul 05, 2022. FINDINGS: Lower chest: No acute abnormality. Hepatobiliary: Status post cholecystectomy. Stable intrahepatic and extrahepatic biliary dilatation is noted which may be related to prior cholecystectomy. No  other abnormality seen involving the liver. Pancreas: Unremarkable. No pancreatic ductal dilatation or surrounding inflammatory changes. Spleen: Normal in size without focal abnormality. Adrenals/Urinary Tract: Adrenal glands appear normal. Bilateral renal cysts are noted for which no further follow-up is required. No hydronephrosis or renal obstruction is noted. Urinary bladder is unremarkable. Stomach/Bowel: The stomach is unremarkable. There is no evidence of bowel obstruction or definitive inflammation. The appendix is not visualized. There remains a small amount of free air around the cecum but this is significantly decreased compared to prior exam. No other evidence of pneumoperitoneum is noted currently. Vascular/Lymphatic: Aortic atherosclerosis. No enlarged abdominal or pelvic lymph nodes. Reproductive: Status post hysterectomy. No adnexal masses. Other: No hernia or ascites is noted. Musculoskeletal: No acute or significant osseous findings. IMPRESSION: There remains a small amount of free air around the cecum which is significantly decreased  compared to prior exam, suggesting improved or resolved perforation. No other evidence of pneumoperitoneum is noted. No definite evidence of bowel obstruction or inflammation is noted currently. Stable intrahepatic and extrahepatic biliary dilatation is noted which most likely is secondary to post cholecystectomy status. Aortic Atherosclerosis (ICD10-I70.0). Electronically Signed   By: Lupita Raider M.D.   On: 07/09/2022 12:29   CT ABDOMEN PELVIS W CONTRAST  Result Date: 07/05/2022 CLINICAL DATA:  77 year old female with history of acute onset of upper abdominal pain, nausea and vomiting. EXAM: CT ABDOMEN AND PELVIS WITH CONTRAST TECHNIQUE: Multidetector CT imaging of the abdomen and pelvis was performed using the standard protocol following bolus administration of intravenous contrast. RADIATION DOSE REDUCTION: This exam was performed according to the  departmental dose-optimization program which includes automated exposure control, adjustment of the mA and/or kV according to patient size and/or use of iterative reconstruction technique. CONTRAST:  OMNIPAQUE IOHEXOL 300 MG/ML  SOLN COMPARISON:  CT of the abdomen and pelvis 01/24/2021. FINDINGS: Lower chest: Atherosclerotic calcifications in the descending thoracic aorta as well as the left anterior descending coronary artery. Hepatobiliary: No suspicious cystic or solid hepatic lesions. Status post cholecystectomy. Severe intra and extrahepatic biliary ductal dilatation, with the common bile duct measuring up to 21 mm in the porta hepatis, increased compared to the prior study. No calcified choledocholithiasis. Common bile duct dilatation extends all the way to the level of the ampulla. Pancreas: No pancreatic mass. No pancreatic ductal dilatation. No pancreatic or peripancreatic fluid collections or inflammatory changes. Spleen: Unremarkable. Adrenals/Urinary Tract: Low-attenuation lesions in both kidneys compatible with simple cysts (Bosniak class 1, no imaging follow-up recommended), measuring up to 3.4 cm in the upper pole of the left kidney. No suspicious appearing renal lesions. No hydroureteronephrosis. Urinary bladder is unremarkable in appearance. Bilateral adrenal glands are normal in appearance. Stomach/Bowel: The appearance of the stomach is normal. There are multiple prominent but nondilated loops of small bowel in the central abdomen measuring up to 2.7 cm in diameter, with several air-fluid levels. Distal small bowel appears nearly completely decompressed. Much of the colon appears relatively decompressed, although there is liquid stool in the cecum and proximal colon. Notably, there is extraluminal gas which appears clustered around the cecum and proximal ascending colon. Appendix is not confidently identified. Vascular/Lymphatic: Aortic atherosclerosis, without evidence of aneurysm or  dissection in the abdominal or pelvic vasculature. No lymphadenopathy noted in the abdomen or pelvis. Reproductive: Status post hysterectomy. Ovaries are not confidently identified may be surgically absent or atrophic. Other: Moderate amount of pneumoperitoneum. The greatest extent of pneumoperitoneum is clustered in the right-side of the abdomen adjacent to the cecum and proximal ascending colon suggesting this as a site of probable perforation. Musculoskeletal: There are no aggressive appearing lytic or blastic lesions noted in the visualized portions of the skeleton. IMPRESSION: 1. Moderate volume of pneumoperitoneum which appears centered around the cecum and proximal ascending colon suggesting this as the site of probable perforation. The exact cause of perforation is not readily apparent, however, surgical consultation is recommended. 2. Multiple prominent borderline dilated loops of small bowel, many of which demonstrate air-fluid levels. Overall, the pattern does not appear obstructive, favored to represent a reactive ileus. 3. Status post cholecystectomy with worsening intra and extrahepatic biliary ductal dilatation. This extends all the way to the level of the ampulla. No retained choledocholithiasis noted. No definite ampullary mass. If the patient has biochemical evidence of biliary tract obstruction, further evaluation with follow-up abdominal MRI with and without  IV gadolinium with MRCP should be considered. 4. Aortic atherosclerosis, in addition to left anterior descending coronary artery disease. Assessment for potential risk factor modification, dietary therapy or pharmacologic therapy may be warranted, if clinically indicated. 5. Additional incidental findings, as above. Critical Value/emergent results were called by telephone at the time of interpretation on 07/05/2022 at 6:44 am to provider JADE SUNG , who verbally acknowledged these results. Electronically Signed   By: Trudie Reed M.D.    On: 07/05/2022 06:45    Microbiology: Results for orders placed or performed during the hospital encounter of 07/05/22  Culture, blood (Routine X 2) w Reflex to ID Panel     Status: None (Preliminary result)   Collection Time: 07/05/22 10:46 AM   Specimen: BLOOD RIGHT ARM  Result Value Ref Range Status   Specimen Description BLOOD RIGHT ARM  Final   Special Requests   Final    BOTTLES DRAWN AEROBIC AND ANAEROBIC Blood Culture adequate volume   Culture   Final    NO GROWTH 4 DAYS Performed at [facility], [address] [place], [place] [address]    Report Status PENDING  Incomplete  Culture, blood (Routine X 2) w Reflex to ID Panel     Status: None (Preliminary result)   Collection Time: 07/05/22 10:47 AM   Specimen: BLOOD RIGHT ARM  Result Value Ref Range Status   Specimen Description BLOOD RIGHT ARM  Final   Special Requests   Final    BOTTLES DRAWN AEROBIC AND ANAEROBIC Blood Culture adequate volume   Culture   Final    NO GROWTH 4 DAYS Performed at [facility], [address] [place], [place] [address]    Report Status PENDING  Incomplete    Labs: CBC: Recent Labs  Lab 07/05/22 0454 07/06/22 0507 07/07/22 0429  WBC 12.7* 6.1 5.9  NEUTROABS 9.9*  --   --   HGB 16.7* 14.0 13.7  HCT 53.4* 46.3* 44.3  MCV 88.7 92.6 90.0  PLT 198 149* 172   Basic Metabolic Panel: Recent Labs  Lab 07/05/22 0454 07/05/22 1315 07/06/22 0507 07/07/22 0429  NA 137  --  137 137  K 5.3* 4.1 4.2 4.1  CL 103  --  109 106  CO2 24  --  18* 24  GLUCOSE 234*  --  123* 160*  BUN 24*  --  22 20  CREATININE 1.02*  --  0.81 0.86  CALCIUM 9.5  --  8.2* 8.2*  MG  --   --   --  2.3  PHOS  --   --   --  2.7   Liver Function Tests: Recent Labs  Lab 07/05/22 0454 07/07/22 0429  AST 31 97*  ALT 36 86*  ALKPHOS 137* 167*  BILITOT 1.5* 1.0  PROT 8.6* 6.1*  ALBUMIN 4.6 3.3*   CBG: Recent Labs  Lab 07/08/22 1127 07/08/22 1641 07/08/22 2111 07/09/22 0749  07/09/22 1224  GLUCAP 242* 187* 221* 123* 208*    Discharge time spent: greater than 30 minutes.  Signed: Marrion Coy, MD [facility] 07/09/2022

## 2022-07-09 NOTE — Progress Notes (Signed)
Subjective:  CC: Mariah Reed is a 77 y.o. female  Hospital stay day 4,   colon perforation  HPI: No acute issues overnight.  Minor TTP present today but tolerating regular food so far.  No N/V.  ROS:  General: Denies weight loss, weight gain, fatigue, fevers, chills, and night sweats. Heart: Denies chest pain, palpitations, racing heart, irregular heartbeat, leg pain or swelling, and decreased activity tolerance. Respiratory: Denies breathing difficulty, shortness of breath, wheezing, cough, and sputum. GI: Denies change in appetite, heartburn, nausea, vomiting, constipation, diarrhea, and blood in stool. GU: Denies difficulty urinating, pain with urinating, urgency, frequency, blood in urine.   Objective:   Temp:  [97.7 F (36.5 C)-98.2 F (36.8 C)] 98 F (36.7 C) (06/03 0750) Pulse Rate:  [50-78] 67 (06/03 0750) Resp:  [16-20] 18 (06/03 0750) BP: (101-124)/(52-71) 124/67 (06/03 0750) SpO2:  [97 %-99 %] 97 % (06/03 0750) Weight:  [86 kg] 86 kg (06/03 0457)     Height: 5\' 5"  (165.1 cm) Weight: 86 kg BMI (Calculated): 31.55   Intake/Output this shift:   Intake/Output Summary (Last 24 hours) at 07/09/2022 0853 Last data filed at 07/09/2022 4098 Gross per 24 hour  Intake 1057.81 ml  Output 0 ml  Net 1057.81 ml    Constitutional :  alert, cooperative, appears stated age, and no distress  Respiratory:  clear to auscultation bilaterally  Cardiovascular:  regular rate and rhythm  Gastrointestinal: Soft, no guarding, minor tenderness to palpation right lower quadrant. .   Skin: Cool and moist.   Psychiatric: Normal affect, non-agitated, not confused       LABS:     Latest Ref Rng & Units 07/07/2022    4:29 AM 07/06/2022    5:07 AM 07/05/2022    1:15 PM  CMP  Glucose 70 - 99 mg/dL 119  147    BUN 8 - 23 mg/dL 20  22    Creatinine 8.29 - 1.00 mg/dL 5.62  1.30    Sodium 865 - 145 mmol/L 137  137    Potassium 3.5 - 5.1 mmol/L 4.1  4.2  4.1   Chloride 98 - 111 mmol/L 106  109     CO2 22 - 32 mmol/L 24  18    Calcium 8.9 - 10.3 mg/dL 8.2  8.2    Total Protein 6.5 - 8.1 g/dL 6.1     Total Bilirubin 0.3 - 1.2 mg/dL 1.0     Alkaline Phos 38 - 126 U/L 167     AST 15 - 41 U/L 97     ALT 0 - 44 U/L 86         Latest Ref Rng & Units 07/07/2022    4:29 AM 07/06/2022    5:07 AM 07/05/2022    4:54 AM  CBC  WBC 4.0 - 10.5 K/uL 5.9  6.1  12.7   Hemoglobin 12.0 - 15.0 g/dL 78.4  69.6  29.5   Hematocrit 36.0 - 46.0 % 44.3  46.3  53.4   Platelets 150 - 400 K/uL 172  149  198     RADS: CLINICAL DATA:  Diverticulitis.   EXAM: CT ABDOMEN AND PELVIS WITH CONTRAST   TECHNIQUE: Multidetector CT imaging of the abdomen and pelvis was performed using the standard protocol following bolus administration of intravenous contrast.   RADIATION DOSE REDUCTION: This exam was performed according to the departmental dose-optimization program which includes automated exposure control, adjustment of the mA and/or kV according to patient size and/or use  of iterative reconstruction technique.   CONTRAST:  OMNIPAQUE IOHEXOL 300 MG/ML  SOLN   COMPARISON:  Jul 05, 2022.   FINDINGS: Lower chest: No acute abnormality.   Hepatobiliary: Status post cholecystectomy. Stable intrahepatic and extrahepatic biliary dilatation is noted which may be related to prior cholecystectomy. No other abnormality seen involving the liver.   Pancreas: Unremarkable. No pancreatic ductal dilatation or surrounding inflammatory changes.   Spleen: Normal in size without focal abnormality.   Adrenals/Urinary Tract: Adrenal glands appear normal. Bilateral renal cysts are noted for which no further follow-up is required. No hydronephrosis or renal obstruction is noted. Urinary bladder is unremarkable.   Stomach/Bowel: The stomach is unremarkable. There is no evidence of bowel obstruction or definitive inflammation. The appendix is not visualized. There remains a small amount of free air around  the cecum but this is significantly decreased compared to prior exam. No other evidence of pneumoperitoneum is noted currently.   Vascular/Lymphatic: Aortic atherosclerosis. No enlarged abdominal or pelvic lymph nodes.   Reproductive: Status post hysterectomy. No adnexal masses.   Other: No hernia or ascites is noted.   Musculoskeletal: No acute or significant osseous findings.   IMPRESSION: There remains a small amount of free air around the cecum which is significantly decreased compared to prior exam, suggesting improved or resolved perforation. No other evidence of pneumoperitoneum is noted. No definite evidence of bowel obstruction or inflammation is noted currently.   Stable intrahepatic and extrahepatic biliary dilatation is noted which most likely is secondary to post cholecystectomy status.   Aortic Atherosclerosis (ICD10-I70.0).     Electronically Signed   By: Lupita Raider M.D.   On: 07/09/2022 12:29 Assessment:   Colon perforation, presumed perforated diverticulitis.  Clinically remained stable with regular diet, but due to extent of original presentation, repeat CT today ordered with results as above.  Ok to d/c since clinically doing well, continue to monitor as outpt.  F/u two weeks to discuss interval colonoscopy  labs/images/medications/previous chart entries reviewed personally and relevant changes/updates noted above.

## 2022-07-09 NOTE — Plan of Care (Signed)
  Problem: Education: Goal: Ability to describe self-care measures that may prevent or decrease complications (Diabetes Survival Skills Education) will improve Outcome: Progressing Goal: Individualized Educational Video(s) Outcome: Progressing   Problem: Coping: Goal: Ability to adjust to condition or change in health will improve Outcome: Progressing   Problem: Fluid Volume: Goal: Ability to maintain a balanced intake and output will improve Outcome: Progressing   Problem: Health Behavior/Discharge Planning: Goal: Ability to identify and utilize available resources and services will improve Outcome: Progressing Goal: Ability to manage health-related needs will improve Outcome: Progressing   Problem: Metabolic: Goal: Ability to maintain appropriate glucose levels will improve Outcome: Progressing   Problem: Nutritional: Goal: Maintenance of adequate nutrition will improve Outcome: Progressing Goal: Progress toward achieving an optimal weight will improve Outcome: Progressing   Problem: Skin Integrity: Goal: Risk for impaired skin integrity will decrease Outcome: Progressing   Problem: Tissue Perfusion: Goal: Adequacy of tissue perfusion will improve Outcome: Progressing   Problem: Health Behavior/Discharge Planning: Goal: Ability to manage health-related needs will improve Outcome: Progressing   Problem: Clinical Measurements: Goal: Ability to maintain clinical measurements within normal limits will improve Outcome: Progressing Goal: Will remain free from infection Outcome: Progressing Goal: Diagnostic test results will improve Outcome: Progressing Goal: Respiratory complications will improve Outcome: Progressing Goal: Cardiovascular complication will be avoided Outcome: Progressing   Problem: Activity: Goal: Risk for activity intolerance will decrease Outcome: Progressing   Problem: Nutrition: Goal: Adequate nutrition will be maintained Outcome:  Progressing   Problem: Coping: Goal: Level of anxiety will decrease Outcome: Progressing   Problem: Elimination: Goal: Will not experience complications related to bowel motility Outcome: Progressing Goal: Will not experience complications related to urinary retention Outcome: Progressing   Problem: Pain Managment: Goal: General experience of comfort will improve Outcome: Progressing   Problem: Safety: Goal: Ability to remain free from injury will improve Outcome: Progressing   Problem: Skin Integrity: Goal: Risk for impaired skin integrity will decrease Outcome: Progressing   

## 2022-07-09 NOTE — Care Management Important Message (Signed)
Important Message  Patient Details  Name: Mariah Reed MRN: [id number] Date of Birth: 06-Jul-1945   Medicare Important Message Given:  Yes     Johnell Comings 07/09/2022, 10:35 AM

## 2022-07-10 LAB — CULTURE, BLOOD (ROUTINE X 2)
Culture: NO GROWTH
Special Requests: ADEQUATE

## 2022-07-10 NOTE — Telephone Encounter (Signed)
Requested medications are due for refill today.  yes  Requested medications are on the active medications list.  yes  Last refill. 03/08/2022 !6 mL 3 rf  Future visit scheduled.   no  Notes to clinic.  Ivory Broad listed as PCP.    Requested Prescriptions  Pending Prescriptions Disp Refills   fluticasone (FLONASE) 50 MCG/ACT nasal spray [Pharmacy Med Name: FLUTICASONE PROPIONATE SUSPENSION] 16 mL 3    Sig: PLACE TWO SPRAYS INTO BOTH NOSTRILS ONCE DAILY     Ear, Nose, and Throat: Nasal Preparations - Corticosteroids Passed - 07/09/2022  1:58 PM      Passed - Valid encounter within last 12 months    Recent Outpatient Visits           2 months ago Rash and nonspecific skin eruption   [facility] Danelle Berry, PA-C   6 months ago Type 2 diabetes mellitus with hyperglycemia, with long-term current use of insulin [facility])   [facility] Margarita Mail, DO   7 months ago Primary hypertension   [facility] Margarita Mail, DO   2 years ago Hypertension associated with diabetes [facility])   [place] [facility], Marzella Schlein, MD

## 2022-07-19 ENCOUNTER — Emergency Department
Admission: EM | Admit: 2022-07-19 | Discharge: 2022-07-20 | Disposition: A | Discharge status: Home / Self Care | Payer: Medicare Other | Attending: Emergency Medicine | Admitting: Emergency Medicine

## 2022-07-19 ENCOUNTER — Emergency Department: Payer: Medicare Other

## 2022-07-19 DIAGNOSIS — J449 Chronic obstructive pulmonary disease, unspecified: Secondary | ICD-10-CM | POA: Insufficient documentation

## 2022-07-19 DIAGNOSIS — J40 Bronchitis, not specified as acute or chronic: Secondary | ICD-10-CM

## 2022-07-19 DIAGNOSIS — R062 Wheezing: Secondary | ICD-10-CM | POA: Diagnosis present

## 2022-07-19 MED ORDER — HYDROCOD POLI-CHLORPHE POLI ER 10-8 MG/5ML PO SUER: 5.0000 mL | Freq: Four times a day (QID) | ORAL | 0 refills | Status: AC | PRN

## 2022-07-19 MED ORDER — IPRATROPIUM-ALBUTEROL 0.5-2.5 (3) MG/3ML IN SOLN
6.0000 mL | Freq: Once | RESPIRATORY_TRACT | Status: AC
  Administered 2022-07-19: 6 mL via RESPIRATORY_TRACT
  Filled 2022-07-19: qty 3

## 2022-07-19 MED ORDER — ALBUTEROL SULFATE (2.5 MG/3ML) 0.083% IN NEBU: 2.5000 mg | INHALATION_SOLUTION | RESPIRATORY_TRACT | 0 refills | Status: AC | PRN

## 2022-07-19 MED ORDER — METHYLPREDNISOLONE SODIUM SUCC 125 MG IJ SOLR
125.0000 mg | Freq: Once | INTRAMUSCULAR | Status: AC
  Administered 2022-07-19: 125 mg via INTRAMUSCULAR
  Filled 2022-07-19: qty 2

## 2022-07-19 MED ORDER — HYDROCODONE-ACETAMINOPHEN 5-325 MG PO TABS
1.0000 | ORAL_TABLET | Freq: Once | ORAL | Status: AC
  Administered 2022-07-20: 1 via ORAL
  Filled 2022-07-19: qty 1

## 2022-07-19 MED ORDER — PREDNISONE 10 MG PO TABS: 10.0000 mg | ORAL_TABLET | ORAL | 0 refills | Status: AC

## 2022-07-19 MED ORDER — BENZONATATE 100 MG PO CAPS: 100.0000 mg | ORAL_CAPSULE | Freq: Three times a day (TID) | ORAL | 0 refills | Status: AC | PRN

## 2022-07-19 NOTE — ED Provider Notes (Signed)
[facility] Provider Note  Patient Contact: 10:46 PM (approximate)   History   Cough   HPI  Mariah Reed is a 77 y.o. female who presents the emergency department complaining of ongoing cough, wheezing.  Patient was diagnosed with likely bronchitis placed on antibiotic, steroid.  She states that she is still coughing in the cough medicine prescribed as well as over-the-counter cough medicines have not alleviated her symptoms.  Patient denies fevers, sore throat, difficulty breathing at this time.  Patient denies any GI symptoms.     Physical Exam   Triage Vital Signs: ED Triage Vitals  Enc Vitals Group     BP 07/19/22 2056 (!) 152/91     Pulse Rate 07/19/22 2056 (!) 102     Resp --      Temp 07/19/22 2056 98.3 F (36.8 C)     Temp Source 07/19/22 2056 Oral     SpO2 07/19/22 2056 94 %     Weight 07/19/22 2053 189 lb 9.5 oz (86 kg)     Height 07/19/22 2053 5\' 5"  (1.651 m)     Head Circumference --      Peak Flow --      Pain Score 07/19/22 2053 10     Pain Loc --      Pain Edu? --      Excl. in GC? --     Most recent vital signs: Vitals:   07/19/22 2056 07/19/22 2058  BP: (!) 152/91   Pulse: (!) 102 98  Temp: 98.3 F (36.8 C)   SpO2: 94%      General: Alert and in no acute distress. ENT:      Ears:       Nose: No congestion/rhinnorhea.      Mouth/Throat: Mucous membranes are moist. Neck: No stridor. No cervical spine tenderness to palpation.  Cardiovascular:  Good peripheral perfusion Respiratory: Normal respiratory effort without tachypnea or retractions. Lungs with some mild rales in the lower lung fields with expiratory wheezing bilaterally.Peri Jefferson air entry to the bases with no decreased or absent breath sounds. Musculoskeletal: Full range of motion to all extremities.  Neurologic:  No gross focal neurologic deficits are appreciated.  Skin:   No rash noted Other:   ED Results / Procedures / Treatments   Labs (all labs  ordered are listed, but only abnormal results are displayed) Labs Reviewed - No data to display   EKG     RADIOLOGY  I personally viewed, evaluated, and interpreted these images as part of my medical decision making, as well as reviewing the written report by the radiologist.  ED Provider Interpretation: Peribronchial thickening concerning for bronchitis without consolidation concerning for pneumonia.  There is a small amount of free air under the right Heema diaphragm that is already been visualized on CT 10 days ago.  DG Chest 2 View  Result Date: 07/19/2022 CLINICAL DATA:  Cough, bronchitis. EXAM: CHEST - 2 VIEW COMPARISON:  Radiograph 08/23/2021, abdominopelvic CT 07/09/2022 per FINDINGS: The cardiomediastinal contours are normal. Mild chronic bronchial thickening. Pulmonary vasculature is normal. No consolidation, pleural effusion, or pneumothorax. No acute osseous abnormalities are seen. Small amount of free air under the right hemidiaphragm. IMPRESSION: 1. Mild chronic bronchial thickening.  No focal airspace disease. 2. Small amount of free air under the right hemidiaphragm is likely migration of the known free air seen on CT 07/09/2022. Electronically Signed   By: Narda Rutherford M.D.   On: 07/19/2022 21:44  PROCEDURES:  Critical Care performed: No  Procedures   MEDICATIONS ORDERED IN ED: Medications  ipratropium-albuterol (DUONEB) 0.5-2.5 (3) MG/3ML nebulizer solution 6 mL (6 mLs Nebulization Given 07/19/22 2253)  methylPREDNISolone sodium succinate (SOLU-MEDROL) 125 mg/2 mL injection 125 mg (125 mg Intramuscular Given 07/19/22 2252)     IMPRESSION / MDM / ASSESSMENT AND PLAN / ED COURSE  I reviewed the triage vital signs and the nursing notes.                                 Differential diagnosis includes, but is not limited to, pneumonia, COPD exacerbation, bronchitis   Patient's presentation is most consistent with acute presentation with potential threat to  life or bodily function.   Patient's diagnosis is consistent with bronchitis.  Patient presents emergency department with ongoing cough.  Patient has been seen by primary care, completed a course of antibiotic, short course of steroid and had cough medication.  Patient is still coughing.  She states that she does have albuterol and Spiriva for her COPD, has used them once a day as prescribed.  Patient did have some wheezing on exam on arrival.  Her vital signs were reassuring with her being afebrile, normal rate and rhythm on auscultation of her heart, O2 sat are 94 to 96% in the room.  Patient was given DuoNeb and Solu-Medrol and has a significant improvement of the wheezing.  She still coughing at this time but do not feel that she requires inpatient admission given the reassuring vital signs, improvement with the breathing treatments.  Will prescribe cough medication, steroids, as well as a nebulizer for albuterol for the next several days.  Patient is agreeable with this plan.  Concerning signs and symptoms and return precautions discussed with the patient.  Follow-up primary care as needed..  Patient is given ED precautions to return to the ED for any worsening or new symptoms.     FINAL CLINICAL IMPRESSION(S) / ED DIAGNOSES   Final diagnoses:  Bronchitis     Rx / DC Orders   ED Discharge Orders          Ordered    chlorpheniramine-HYDROcodone (TUSSIONEX) 10-8 MG/5ML  Every 6 hours PRN        07/19/22 2329    benzonatate (TESSALON PERLES) 100 MG capsule  3 times daily PRN        07/19/22 2329    predniSONE (DELTASONE) 10 MG tablet  As directed       Note to Pharmacy: Take on a pattern of 6, 6, 5, 5, 4, 4, 3, 3, 2, 2, 1, 1   07/19/22 2329    albuterol (PROVENTIL) (2.5 MG/3ML) 0.083% nebulizer solution  Every 4 hours PRN        07/19/22 2329    For home use only DME Nebulizer machine        07/19/22 2329             Note:  This document was prepared using Dragon voice  recognition software and may include unintentional dictation errors.   Lanette Hampshire 07/19/22 2333    Jene Every, MD 07/20/22 [phone]

## 2022-07-19 NOTE — ED Triage Notes (Signed)
Pt arrived POV with c/o cough x2 weeks, seen at [facility] earlier and dx with bronchitis, they prescribe ABX doxycycline. Prednisone, and cough syrup. Pt reports none seem to be helping her cough, pt reports rib cage starting to hurt d/t "coughing 24/7", pt reports is wanting a script for codeine cough syrup as OTC meds not easing cough as well. Hx COPD and former smoker.

## 2022-07-20 DIAGNOSIS — J449 Chronic obstructive pulmonary disease, unspecified: Secondary | ICD-10-CM | POA: Diagnosis not present

## 2022-07-24 NOTE — Telephone Encounter (Signed)
Called pt to verify if she sees a provider at [facility]. Pt has no PCP here at our office.

## 2022-07-24 NOTE — Transitions of Care (Post Inpatient/ED Visit) (Signed)
   07/24/2022  Name: DETA DIPASQUALE MRN: [id number] DOB: Nov 14, 1945  Today's TOC FU Call Status:    Attempted to reach the patient regarding the most recent Inpatient/ED visit.  Follow Up Plan: No further outreach attempts will be made at this time. We have been unable to contact the patient.  Signature Arvil Persons, BSN, Charity fundraiser

## 2022-08-08 ENCOUNTER — Other Ambulatory Visit: Payer: Self-pay | Admitting: Internal Medicine

## 2022-08-08 NOTE — Telephone Encounter (Signed)
Requested Prescriptions  Pending Prescriptions Disp Refills   albuterol (VENTOLIN HFA) 108 (90 Base) MCG/ACT inhaler [Pharmacy Med Name: ALBUTEROL SULFATE HFA HFA AEROSOL SOLN] 18 each 3    Sig: INHALE TWO INHALATIONS INTO THE LUNGS EVERY FOUR HOURS AS NEEDED FOR WHEEZING     Pulmonology:  Beta Agonists 2 Failed - 08/08/2022  2:45 PM      Failed - Last BP in normal range    BP Readings from Last 1 Encounters:  07/20/22 (!) 159/97         Failed - Last Heart Rate in normal range    Pulse Readings from Last 1 Encounters:  07/20/22 (!) 114         Passed - Valid encounter within last 12 months    Recent Outpatient Visits           3 months ago Rash and nonspecific skin eruption   [facility] [place], Sheliah Mends, PA-C   7 months ago Type 2 diabetes mellitus with hyperglycemia, with long-term current use of insulin [facility])   [facility]arita Mail, DO   8 months ago Primary hypertension   [facility] Margarita Mail, DO   2 years ago Hypertension associated with diabetes [facility])   [facility] [place], Marzella Schlein, MD

## 2022-08-30 ENCOUNTER — Other Ambulatory Visit: Payer: Self-pay | Admitting: Specialist

## 2022-08-30 DIAGNOSIS — M7989 Other specified soft tissue disorders: Secondary | ICD-10-CM

## 2022-08-31 ENCOUNTER — Ambulatory Visit
Admission: RE | Admit: 2022-08-31 | Discharge: 2022-08-31 | Disposition: A | Discharge status: Home / Self Care | Payer: Medicare Other | Source: Ambulatory Visit | Attending: Specialist | Admitting: Specialist

## 2022-08-31 ENCOUNTER — Other Ambulatory Visit: Payer: Self-pay | Admitting: Specialist

## 2022-08-31 DIAGNOSIS — M7989 Other specified soft tissue disorders: Secondary | ICD-10-CM | POA: Diagnosis present

## 2022-09-05 ENCOUNTER — Ambulatory Visit: Admit: 2022-09-05 | Payer: Medicare Other | Admitting: Surgery

## 2022-09-05 SURGERY — COLONOSCOPY WITH PROPOFOL
Anesthesia: General

## 2022-09-06 ENCOUNTER — Other Ambulatory Visit: Payer: Self-pay | Admitting: Internal Medicine

## 2022-09-06 DIAGNOSIS — E1165 Type 2 diabetes mellitus with hyperglycemia: Secondary | ICD-10-CM

## 2022-09-07 NOTE — Telephone Encounter (Signed)
Requested medication (s) are due for refill today:   Yes for both  Requested medication (s) are on the active medication list:   Yes for both  Future visit scheduled:   No      Last ordered: Synthroid 03/13/2022 #90, 1 refill;   Basaglar Insulin 03/05/2022 4.8 ml, 2 refills  Returned because A1C and TSH are due  Requested Prescriptions  Pending Prescriptions Disp Refills   levothyroxine (SYNTHROID) 75 MCG tablet [Pharmacy Med Name: LEVOTHYROXINE SODIUM TABLET] 90 tablet 1    Sig: TAKE ONE TABLET BY MOUTH ONCE A DAY. TAKE ON AN EMPTY STOMACH WITH A GLASS OF WATER ATLEAST 30-60 MIN BEFORE BREAKFAST     Endocrinology:  Hypothyroid Agents Failed - 09/06/2022  2:58 PM      Failed - TSH in normal range and within 360 days    TSH  Date Value Ref Range Status  01/26/2021 5.557 (H) 0.350 - 4.500 uIU/mL Final    Comment:    Performed by a 3rd Generation assay with a functional sensitivity of <=0.01 uIU/mL. Performed at [facility], [address] [place], [place] [address]   10/27/2019 4.500 0.450 - 4.500 uIU/mL Final         Passed - Valid encounter within last 12 months    Recent Outpatient Visits           4 months ago Rash and nonspecific skin eruption   [facility] Danelle Berry, PA-C   8 months ago Type 2 diabetes mellitus with hyperglycemia, with long-term current use of insulin [facility])   [facility] Margarita Mail, DO   9 months ago Primary hypertension   [facility] Margarita Mail, DO   2 years ago Hypertension associated with diabetes [facility])   [place] [facility] [place], Marzella Schlein, MD               Insulin Glargine (BASAGLAR KWIKPEN) 100 UNIT/ML [Pharmacy Med Name: Elon Jester 100UNIT SOLN PEN-INJ] 15 mL 2    Sig: INJECT EIGHT UNITS INTO THE SKIN TWO TIMES DAILY     Endocrinology:  Diabetes - Insulins Failed - 09/06/2022  2:58 PM       Failed - HBA1C is between 0 and 7.9 and within 180 days    Hemoglobin A1C  Date Value Ref Range Status  01/04/2022 9.4 (A) 4.0 - 5.6 % Final  09/22/2021 9.2  Final         Passed - Valid encounter within last 6 months    Recent Outpatient Visits           4 months ago Rash and nonspecific skin eruption   [facility] Danelle Berry, PA-C   8 months ago Type 2 diabetes mellitus with hyperglycemia, with long-term current use of insulin [facility])   [facility] Margarita Mail, DO   9 months ago Primary hypertension   [facility] Margarita Mail, DO   2 years ago Hypertension associated with diabetes [facility])   [facility] [place], Marzella Schlein, MD

## 2022-11-07 ENCOUNTER — Other Ambulatory Visit: Payer: Self-pay | Admitting: Internal Medicine

## 2022-11-07 NOTE — Telephone Encounter (Signed)
Requested Prescriptions  Pending Prescriptions Disp Refills   fluticasone (FLONASE) 50 MCG/ACT nasal spray [Pharmacy Med Name: FLUTICASONE PROPIONATE SUSPENSION] 16 mL 3    Sig: PLACE TWO SPRAYS INTO BOTH NOSTRILS ONCE DAILY     Ear, Nose, and Throat: Nasal Preparations - Corticosteroids Passed - 11/07/2022  9:06 AM      Passed - Valid encounter within last 12 months    Recent Outpatient Visits           6 months ago Rash and nonspecific skin eruption   [facility] Danelle Berry, PA-C   10 months ago Type 2 diabetes mellitus with hyperglycemia, with long-term current use of insulin [facility])   [facility] Margarita Mail, DO   11 months ago Primary hypertension   [facility] Margarita Mail, DO   3 years ago Hypertension associated with diabetes [facility])    [facility], Marzella Schlein, MD

## 2022-11-08 ENCOUNTER — Other Ambulatory Visit: Payer: Self-pay | Admitting: Internal Medicine

## 2022-11-09 NOTE — Telephone Encounter (Signed)
Requested Prescriptions  Pending Prescriptions Disp Refills   albuterol (VENTOLIN HFA) 108 (90 Base) MCG/ACT inhaler [Pharmacy Med Name: ALBUTEROL SULFATE HFA HFA AEROSOL SOLN] 18 each 3    Sig: INHALE TWO INHALATIONS INTO THE LUNGS EVERY FOUR HOURS AS NEEDED FOR WHEEZING     Pulmonology:  Beta Agonists 2 Failed - 11/08/2022  4:40 PM      Failed - Last BP in normal range    BP Readings from Last 1 Encounters:  07/20/22 (!) 159/97         Failed - Last Heart Rate in normal range    Pulse Readings from Last 1 Encounters:  07/20/22 (!) 114         Passed - Valid encounter within last 12 months    Recent Outpatient Visits           6 months ago Rash and nonspecific skin eruption   [facility], Sheliah Mends, PA-C   10 months ago Type 2 diabetes mellitus with hyperglycemia, with long-term current use of insulin [facility])   [facility] Margarita Mail, DO   11 months ago Primary hypertension   [facility] Margarita Mail, DO   3 years ago Hypertension associated with diabetes [facility])   [facility] [place], Marzella Schlein, MD

## 2022-12-17 ENCOUNTER — Other Ambulatory Visit: Payer: Self-pay | Admitting: Internal Medicine

## 2022-12-17 DIAGNOSIS — R1084 Generalized abdominal pain: Secondary | ICD-10-CM

## 2022-12-17 DIAGNOSIS — E118 Type 2 diabetes mellitus with unspecified complications: Secondary | ICD-10-CM

## 2022-12-17 DIAGNOSIS — K631 Perforation of intestine (nontraumatic): Secondary | ICD-10-CM

## 2022-12-25 ENCOUNTER — Other Ambulatory Visit: Payer: Self-pay | Admitting: Internal Medicine

## 2022-12-25 ENCOUNTER — Ambulatory Visit
Admission: RE | Admit: 2022-12-25 | Discharge: 2022-12-25 | Disposition: A | Discharge status: Home / Self Care | Payer: Medicare Other | Source: Ambulatory Visit | Attending: Internal Medicine | Admitting: Internal Medicine

## 2022-12-25 DIAGNOSIS — R1084 Generalized abdominal pain: Secondary | ICD-10-CM | POA: Insufficient documentation

## 2022-12-25 DIAGNOSIS — K631 Perforation of intestine (nontraumatic): Secondary | ICD-10-CM

## 2022-12-25 DIAGNOSIS — E118 Type 2 diabetes mellitus with unspecified complications: Secondary | ICD-10-CM | POA: Diagnosis present

## 2022-12-25 MED ORDER — IOHEXOL 9 MG/ML PO SOLN
500.0000 mL | ORAL | Status: AC
  Administered 2022-12-25 (×2): 500 mL via ORAL

## 2022-12-25 MED ORDER — IOHEXOL 300 MG/ML  SOLN: 100.0000 mL | Freq: Once | INTRAMUSCULAR | Status: DC | PRN

## 2022-12-25 MED ORDER — IOHEXOL 300 MG/ML  SOLN: 85.0000 mL | Freq: Once | INTRAMUSCULAR | Status: DC | PRN

## 2023-01-16 ENCOUNTER — Ambulatory Visit: Payer: Medicare Other | Admitting: Cardiology

## 2023-02-20 ENCOUNTER — Other Ambulatory Visit: Payer: Self-pay | Admitting: Internal Medicine

## 2023-02-20 DIAGNOSIS — M545 Low back pain, unspecified: Secondary | ICD-10-CM

## 2023-02-21 NOTE — Telephone Encounter (Signed)
Requested medication (s) are due for refill today: na   Requested medication (s) are on the active medication list: yes   Last refill:  lyrica- 11/26/19 #60 0 refills , flector - 04/09/22 #30 1 refills   Future visit scheduled: no   Notes to clinic:  not delegated per protocol. Expired medication. Do you want to renew Rx? Flector - manual review required.. do you want to refill Rx?     Requested Prescriptions  Pending Prescriptions Disp Refills   pregabalin (LYRICA) 25 MG capsule [Pharmacy Med Name: PREGABALIN 25MG  CAPSULE] 180 capsule 1    Sig: TAKE 1 CAPSULE BY MOUTH TWICE DAILY     Not Delegated - Neurology:  Anticonvulsants - Controlled - pregabalin Failed - 02/21/2023 10:38 AM      Failed - This refill cannot be delegated      Passed - Cr in normal range and within 360 days    Creat  Date Value Ref Range Status  02/10/2018 0.83 mg/dL Final    Comment:    . Age and/or gender not provided. Unable to calculate eGFR. Marland Kitchen           Reference Range           Female:   0.70-1.18           Female: 0.60-0.93           Unable to flag appropriately           due to gender not provided. . For patients >76 years of age, the reference limit for Creatinine is approximately 13% higher for people identified as African-American. .    Creatinine, Ser  Date Value Ref Range Status  07/07/2022 0.86 0.44 - 1.00 mg/dL Final   Creatinine, POC  Date Value Ref Range Status  05/07/2017 50 (4.4) mg/dL Final   Creatinine,U  Date Value Ref Range Status  03/17/2019 141.0 mg/dL Final   Creatinine, Urine  Date Value Ref Range Status  01/04/2022 69 20 - 275 mg/dL Final         Passed - Completed PHQ-2 or PHQ-9 in the last 360 days      Passed - Valid encounter within last 12 months    Recent Outpatient Visits           10 months ago Rash and nonspecific skin eruption   [facility] Danelle Berry, PA-C   1 year ago Type 2 diabetes mellitus with hyperglycemia, with  long-term current use of insulin [facility])   [place] [facility] Margarita Mail, DO   1 year ago Primary hypertension   [place] Margarita Mail, DO   3 years ago Hypertension associated with diabetes [facility])   [facility] [place], Marzella Schlein, MD       Future Appointments             In 1 week Agbor-Etang, Arlys John, MD [facility] 1.3 % [facility] [Pharmacy Med Name: FLECTOR 1.3% PATCH] 30 patch 1    Sig: APPLY ONE PATCH TO THE MOST PAINFUL AREA ONCE OR TWICE DAILY     Analgesics:  Topicals Failed - 02/21/2023 10:38 AM      Failed - Manual Review: Labs are only required if the patient has taken medication for more than 8 weeks.      Passed - PLT in normal range and within  360 days    Platelets  Date Value Ref Range Status  07/07/2022 172 150 - 400 K/uL Final         Passed - HGB in normal range and within 360 days    Hemoglobin  Date Value Ref Range Status  07/07/2022 13.7 12.0 - 15.0 g/dL Final         Passed - HCT in normal range and within 360 days    HCT  Date Value Ref Range Status  07/07/2022 44.3 36.0 - 46.0 % Final         Passed - Cr in normal range and within 360 days    Creat  Date Value Ref Range Status  02/10/2018 0.83 mg/dL Final    Comment:    . Age and/or gender not provided. Unable to calculate eGFR. Marland Kitchen           Reference Range           Female:   0.70-1.18           Female: 0.60-0.93           Unable to flag appropriately           due to gender not provided. . For patients >47 years of age, the reference limit for Creatinine is approximately 13% higher for people identified as African-American. .    Creatinine, Ser  Date Value Ref Range Status  07/07/2022 0.86 0.44 - 1.00 mg/dL Final   Creatinine, POC  Date Value Ref Range Status  05/07/2017 50 (4.4) mg/dL Final   Creatinine,U  Date Value Ref Range Status  03/17/2019  141.0 mg/dL Final   Creatinine, Urine  Date Value Ref Range Status  01/04/2022 69 20 - 275 mg/dL Final         Passed - eGFR is 30 or above and within 360 days    GFR calc Af Amer  Date Value Ref Range Status  10/27/2019 78 >59 mL/min/1.73 Final    Comment:    **[facility] currently reports eGFR in compliance with the current**   recommendations of the [employer]. [facility] will   update reporting as new guidelines are published from the NKF-ASN   Task force.    GFR, Estimated  Date Value Ref Range Status  07/07/2022 >60 >60 mL/min Final    Comment:    (NOTE) Calculated using the CKD-EPI Creatinine Equation (2021)    GFR  Date Value Ref Range Status  03/17/2019 72.10 >60.00 mL/min Final         Passed - Patient is not pregnant      Passed - Valid encounter within last 12 months    Recent Outpatient Visits           10 months ago Rash and nonspecific skin eruption   [facility], PA-C   1 year ago Type 2 diabetes mellitus with hyperglycemia, with long-term current use of insulin [facility])   [place] [facility] Margarita Mail, DO   1 year ago Primary hypertension   [facility] Margarita Mail, DO   3 years ago Hypertension associated with diabetes [facility])   [facility] [place], Marzella Schlein, MD       Future Appointments             In 1 week Agbor-Etang, Arlys John, MD [facility]

## 2023-02-21 NOTE — Telephone Encounter (Signed)
Patient has a new PCP

## 2023-03-06 ENCOUNTER — Ambulatory Visit: Payer: Medicare Other | Admitting: Cardiology

## 2023-03-11 ENCOUNTER — Ambulatory Visit: Payer: Medicare Other | Admitting: Cardiology

## 2023-03-11 ENCOUNTER — Other Ambulatory Visit: Payer: Self-pay | Admitting: Internal Medicine

## 2023-03-12 NOTE — Telephone Encounter (Signed)
 No longer under provider care Requested Prescriptions  Pending Prescriptions Disp Refills   levothyroxine  (SYNTHROID ) 75 MCG tablet [Pharmacy Med Name: LEVOTHYROXINE  SODIUM 75MCG TABLET] 90 tablet 1    Sig: TAKE ONE TABLET BY MOUTH ONCE A DAY. TAKE ON AN EMPTY STOMACH WITH A GLASS OF WATER ATLEAST 30-60 MIN BEFORE BREAKFAST     Endocrinology:  Hypothyroid Agents Failed - 03/12/2023  3:34 PM      Failed - TSH in normal range and within 360 days    TSH  Date Value Ref Range Status  01/26/2021 5.557 (H) 0.350 - 4.500 uIU/mL Final    Comment:    Performed by a 3rd Generation assay with a functional sensitivity of <=0.01 uIU/mL. Performed at [facility], [address], [place], [place] [address]   10/27/2019 4.500 0.450 - 4.500 uIU/mL Final         Passed - Valid encounter within last 12 months    Recent Outpatient Visits           10 months ago Rash and nonspecific skin eruption   [facility] Leavy Mole, PA-C   1 year ago Type 2 diabetes mellitus with hyperglycemia, with long-term current use of insulin  [facility])   [place] [facility] Bernardo Fend, DO   1 year ago Primary hypertension   [place] [facility] Bernardo Fend, DO   3 years ago Hypertension associated with diabetes [facility])   [place] [facility] Bacigalupo, Jon HERO, MD       Future Appointments             In 1 month Agbor-Etang, Redell, MD [place] [facility]             fluticasone  (FLONASE ) 50 MCG/ACT nasal spray [Pharmacy Med Name: FLUTICASONE  PROPIONATE SUSPENSION] 16 mL 3    Sig: PLACE TWO SPRAYS INTO BOTH NOSTRILS ONCE DAILY     Ear, Nose, and Throat: Nasal Preparations - Corticosteroids Passed - 03/12/2023  3:34 PM      Passed - Valid encounter within last 12 months    Recent Outpatient Visits           10 months ago Rash and nonspecific skin eruption   [facility]  [facility] Leavy Mole, PA-C   1 year ago Type 2 diabetes mellitus with hyperglycemia, with long-term current use of insulin  [facility])   [facility] Bernardo Fend, DO   1 year ago Primary hypertension   [place] [facility] Bernardo Fend, DO   3 years ago Hypertension associated with diabetes [facility])   [place] [facility] Bacigalupo, Jon HERO, MD       Future Appointments             In 1 month Agbor-Etang, Redell, MD [facility]

## 2023-04-12 ENCOUNTER — Ambulatory Visit: Payer: Medicare Other | Admitting: Cardiology

## 2023-05-14 ENCOUNTER — Other Ambulatory Visit: Payer: Self-pay | Admitting: Nurse Practitioner

## 2023-05-14 ENCOUNTER — Ambulatory Visit
Admission: RE | Admit: 2023-05-14 | Discharge: 2023-05-14 | Disposition: A | Discharge status: Home / Self Care | Attending: Nurse Practitioner | Admitting: Nurse Practitioner

## 2023-05-14 ENCOUNTER — Ambulatory Visit
Admission: RE | Admit: 2023-05-14 | Discharge: 2023-05-14 | Disposition: A | Discharge status: Home / Self Care | Source: Ambulatory Visit | Attending: Nurse Practitioner | Admitting: Nurse Practitioner

## 2023-05-14 DIAGNOSIS — R109 Unspecified abdominal pain: Secondary | ICD-10-CM

## 2023-05-14 DIAGNOSIS — R140 Abdominal distension (gaseous): Secondary | ICD-10-CM | POA: Insufficient documentation

## 2023-07-02 ENCOUNTER — Ambulatory Visit: Admitting: Cardiology

## 2023-07-22 ENCOUNTER — Encounter: Payer: Self-pay | Admitting: Cardiology

## 2023-07-22 ENCOUNTER — Ambulatory Visit: Attending: Cardiology | Admitting: Cardiology

## 2023-07-22 VITALS — BP 126/64 | HR 78 | Ht 65.0 in | Wt 203.0 lb

## 2023-07-22 DIAGNOSIS — I10 Essential (primary) hypertension: Secondary | ICD-10-CM | POA: Diagnosis not present

## 2023-07-22 DIAGNOSIS — E782 Mixed hyperlipidemia: Secondary | ICD-10-CM | POA: Insufficient documentation

## 2023-07-22 DIAGNOSIS — I2510 Atherosclerotic heart disease of native coronary artery without angina pectoris: Secondary | ICD-10-CM | POA: Insufficient documentation

## 2023-07-22 DIAGNOSIS — R072 Precordial pain: Secondary | ICD-10-CM | POA: Insufficient documentation

## 2023-07-22 NOTE — Progress Notes (Signed)
 Cardiology Office Note:    Date:  07/22/2023   ID:  Mariah, Reed Aug 01, 1945, MRN [id number]  PCP:  Gregoria Leas, NP   [facility] Providers Cardiologist:  Constancia Delton, MD     Referring MD: Rockney Cid, DO   Chief Complaint  Patient presents with   Follow-up    OD 12 mon c/o weight gain irregular heart beat and constipation.. med review verbally.    History of Present Illness:    Mariah Reed is a 78 y.o. female with a hx of CAD (LAD calcification on chest CT ), hypertension, hyperlipidemia, diabetes, spondylosis, COPD, former smoker presenting due to chest pain and irregular heartbeat.  States having palpitations when she exerts herself.  Has also noticed chest pains usually when she lays a certain way in bed.  Gets some shortness of breath with walking, denies chest pain with exertion.  Also endorses abdominal distention/swelling.  Has history of GI irritation with taking potassium, will not want to try Lasix .  Followed up with PCP, thyroid  function testing was ordered, currently pending.  Also endorses occasional palpitations/irregular heartbeat with exertion.  Measured heart rates at home up to 90s beat per minute.   Prior studies/notes  chest CTA 01/23/2021 showing mild LAD calcification disease. Echocardiogram 01/24/2021 EF 50 to 55%.  Normal diastolic function. Lexiscan  Myoview  01/27/2021 no significant perfusion abnormalities, small inferior apical defect and apical thinning.Medical therapy advised.   Aspirin was previously recommended, patient declined due to stomach upset history of GI bleeds. Previously declined Zetia .   Past Medical History:  Diagnosis Date   Allergy    Anemia    Anginal pain (HCC)    Arthritis    CHF (congestive heart failure) (HCC)    COPD (chronic obstructive pulmonary disease) (HCC)    Diabetes mellitus    GERD (gastroesophageal reflux disease)    Gout    Headache    History of kidney stones    Hyperlipidemia     Hypertension    Hypothyroidism    LBBB (left bundle branch block)    Low back pain    Myocardial infarction (HCC)    Nephrolithiasis    Peripheral neuropathy    Pneumonia    Rhabdomyolysis    a.) noted during 12/16-12/25/2022 admission for sepsis 2/2 RIGHT ureteral and RIGHT staghorn nephrolithiasis.   Sepsis (HCC) 01/20/2021   a.) sepsis 2/2 RIGHT ureteral and RIGHT staghorn nephrolithiasis.    Past Surgical History:  Procedure Laterality Date   ABDOMINAL HYSTERECTOMY     CHOLECYSTECTOMY  unknown   Patient stated she does not remember when it was removed. (possibly 1997)   COLONOSCOPY     CYSTOSCOPY WITH RETROGRADE PYELOGRAM, URETEROSCOPY AND STENT PLACEMENT Right 01/20/2021   Procedure: CYSTOSCOPY WITH RETROGRADE PYELOGRAM, URETEROSCOPY AND STENT PLACEMENT;  Surgeon: Lahoma Pigg, MD;  Location: [facility];  Service: Urology;  Laterality: Right;   CYSTOSCOPY/URETEROSCOPY/HOLMIUM LASER/STENT PLACEMENT Right 03/14/2021   Procedure: CYSTOSCOPY/URETEROSCOPY/HOLMIUM LASER/STENT PLACEMENT;  Surgeon: Geraline Knapp, MD;  Location: [facility] OR[facility];  Service: Urology;  Laterality: Right;   CYSTOSCOPY/URETEROSCOPY/HOLMIUM LASER/STENT PLACEMENT Right 05/16/2021   Procedure: CYSTOSCOPY/URETEROSCOPY/HOLMIUM LASER/STENT REMOVAL VS EXCHANGE;  Surgeon: Geraline Knapp, MD;  Location: [facility] ORS;  Service: Urology;  Laterality: Right;   POLYPECTOMY     Colon   TUBAL LIGATION      Current Medications: Current Meds  Medication Sig   ACCU-CHEK FASTCLIX LANCETS MISC 1 Device by Does not apply route 3 (three) times daily. Use to check blood  sugar up to three times a day   albuterol  (PROVENTIL ) (2.5 MG/3ML) 0.083% nebulizer solution Take 3 mLs (2.5 mg total) by nebulization every 4 (four) hours as needed for wheezing or shortness of breath.   albuterol  (VENTOLIN  HFA) 108 (90 Base) MCG/ACT inhaler INHALE TWO INHALATIONS INTO THE LUNGS EVERY FOUR HOURS AS NEEDED FOR WHEEZING   aluminum-magnesium  hydroxide  200-200 MG/5ML suspension Take by mouth as needed for indigestion.   amoxicillin -clavulanate (AUGMENTIN ) 875-125 MG tablet Take 1 tablet by mouth 2 (two) times daily.   diclofenac  (FLECTOR ) 1.3 % PTCH APPLY PATCH TO THE MOST PAINFUL AREA ONCE OR TWICE DAILY   diclofenac  sodium (VOLTAREN ) 1 % GEL APPLY 2 GRAMS TOPICALLY 4 TIMES DAILY   DULoxetine  (CYMBALTA ) 20 MG capsule Take 20 mg by mouth daily.   empagliflozin  (JARDIANCE ) 25 MG TABS tablet TAKE 1 TABLET BY MOUTH ONCE DAILY   esomeprazole  (NEXIUM ) 40 MG capsule TAKE 1 CAPSULE BY MOUTH ONCE DAILY   esomeprazole  (NEXIUM ) 40 MG capsule Take 1 capsule by mouth daily.   fexofenadine (ALLEGRA ODT) 30 MG disintegrating tablet Take 30 mg by mouth daily.   fluconazole  (DIFLUCAN ) 150 MG tablet Take 150 mg by mouth once.   glipiZIDE  (GLUCOTROL ) 10 MG tablet TAKE 1 TABLET BY MOUTH TWICE A DAY BEFORE MEALS   hydrOXYzine  (VISTARIL ) 25 MG capsule Take 1 capsule (25 mg total) by mouth every 8 (eight) hours as needed.   Insulin  Glargine (BASAGLAR  KWIKPEN) 100 UNIT/ML INJECT EIGHT UNITS INTO THE SKIN TWO TIMES DAILY   levothyroxine  (SYNTHROID ) 75 MCG tablet TAKE ONE TABLET BY MOUTH ONCE A DAY. TAKE ON AN EMPTY STOMACH WITH A GLASS OF WATER ATLEAST 30-60 MIN BEFORE BREAKFAST   linagliptin (TRADJENTA) 5 MG TABS tablet TAKE 1 TABLET BY MOUTH ONCE DAILY   lovastatin  (MEVACOR ) 40 MG tablet Take 1 tablet (40 mg total) by mouth 2 (two) times daily. Please call [phone] to schedule yearly appointment for further refills. Thank you.   metoprolol  tartrate (LOPRESSOR ) 25 MG tablet TAKE 1 TABLET BY MOUTH TWICE A DAY   nitroGLYCERIN  (NITROSTAT ) 0.4 MG SL tablet Take SL prn chest pain/May repeat 5 min apart up to 3 times prn   nystatin  cream (MYCOSTATIN ) APPLY TO AFFECTED AREAS ONCE DAILY AS NEEDED   Pancrelipase , Lip-Prot-Amyl, (ZENPEP ) 10000-32000 units CPEP Take 1 capsule by mouth 3 (three) times daily before meals.   pregabalin  (LYRICA ) 25 MG capsule Take 1 capsule (25  mg total) by mouth 2 (two) times daily.   spironolactone  (ALDACTONE ) 50 MG tablet Take 50 mg by mouth daily.   tiotropium (SPIRIVA  HANDIHALER) 18 MCG inhalation capsule PLACE 1 CAPSULE INTO INHALER AND INHALE ONCE DAILY   tiZANidine  (ZANAFLEX ) 4 MG tablet TAKE 1 TABLET BY MOUTH 2 TIMES DAILY AS NEEDED   topiramate  (TOPAMAX ) 100 MG tablet Take 2 tablets (200 mg total) by mouth daily.   triamcinolone  ointment (KENALOG ) 0.1 % Apply 1 Application topically 2 (two) times daily.     Allergies:   Azithromycin , Levofloxacin, Nortriptyline , Psyllium, and Tape   Social History   Socioeconomic History   Marital status: Legally Separated    Spouse name: Coren Crownover   Number of children: Not on file   Years of education: Not on file   Highest education level: Not on file  Occupational History    Employer: DISABLED  Tobacco Use   Smoking status: Former    Current packs/day: 2.50    Average packs/day: 2.5 packs/day for 10.0 years (25.0 ttl pk-yrs)  Types: Cigarettes   Smokeless tobacco: Never  Vaping Use   Vaping status: Never Used  Substance and Sexual Activity   Alcohol use: No   Drug use: No   Sexual activity: Not Currently  Other Topics Concern   Not on file  Social History Narrative   Pt would like Vivi Grit at [phone] (nephew) to be called in case of emergency and ask him to call her daughter Oza Blumenthal at [phone].   Social Drivers of Corporate investment banker Strain: Low Risk  (03/07/2023)   [facility]   Overall Financial Resource Strain (CARDIA)    Difficulty of Paying Living Expenses: Not hard at all  Food Insecurity: No Food Insecurity (03/07/2023)   [facility]   Hunger Vital Sign    Within the past 12 months, you worried that your food would run out before you got the money to buy more.: Never true    Within the past 12 months, the food you bought just didn't last and you didn't  have money to get more.: Never true  Transportation Needs: No Transportation Needs (03/07/2023)   Received from [facility] - Transportation    In the past 12 months, has lack of transportation kept you from medical appointments or from getting medications?: No    Lack of Transportation (Non-Medical): No  Physical Activity: Not on file  Stress: Not on file  Social Connections: Not on file     Family History: The patient's family history includes Uterine cancer in her mother.  ROS:   Please see the history of present illness.     All other systems reviewed and are negative.  EKGs/Labs/Other Studies Reviewed:    The following studies were reviewed today:  EKG Interpretation Date/Time:  Monday July 22 2023 10:53:36 EDT Ventricular Rate:  78 PR Interval:  170 QRS Duration:  128 QT Interval:  402 QTC Calculation: 458 R Axis:   -8  Text Interpretation: Normal sinus rhythm Left bundle branch block Confirmed by Constancia Delton ([id number]) on 07/22/2023 11:09:36 AM    Recent Labs: No results found for requested labs within last 365 days.  Recent Lipid Panel    Component Value Date/Time   CHOL 168 01/26/2021 0546   CHOL 170 10/27/2019 1009   TRIG 186 (H) 01/26/2021 0546   HDL 22 (L) 01/26/2021 0546   HDL 45 10/27/2019 1009   CHOLHDL 7.6 01/26/2021 0546   VLDL 37 01/26/2021 0546   LDLCALC 109 (H) 01/26/2021 0546   LDLCALC 95 10/27/2019 1009   LDLCALC 113 (H) 02/10/2018 1123   LDLDIRECT 71.0 03/17/2019 1135     Risk Assessment/Calculations:          Physical Exam:    VS:  BP 126/64 (BP Location: Right Arm, Patient Position: Sitting, Cuff Size: Normal)   Pulse 78   Ht 5' 5 (1.651 m)   Wt 203 lb (92.1 kg)   SpO2 96%   BMI 33.78 kg/m     Wt Readings from Last 3 Encounters:  07/22/23 203 lb (92.1 kg)  07/19/22 189 lb 9.5 oz (86 kg)  07/09/22 189 lb 9.5 oz (86 kg)     GEN:  Well nourished, well developed in no acute distress HEENT:  Normal NECK: No JVD; No carotid bruits CARDIAC: RRR, no murmurs, rubs, gallops RESPIRATORY:  Clear to auscultation without rales, wheezing or rhonchi  ABDOMEN: Soft, non-tender, non-distended MUSCULOSKELETAL:  No  edema; left hand deformity noted SKIN: Warm and dry NEUROLOGIC:  Alert and oriented x 3 PSYCHIATRIC:  Normal affect   ASSESSMENT:    1. Precordial pain   2. Coronary artery disease, unspecified vessel or lesion type, unspecified whether angina present, unspecified whether native or transplanted heart   3. Mixed hyperlipidemia   4. Primary hypertension    PLAN:    In order of problems listed above:   Chest pain, symptoms appear noncardiac, likely musculoskeletal.  Obtain echo, obtain Lexiscan  Myoview .  Not able to lay flat due to spondylosis. Hyperlipidemia, not tolerant of Crestor .  Continue lovastatin , declined Zetia .  LAD calcifications on chest CT.  Lovastatin , as above.  Avoiding aspirin due to gastric irritation/bleed.  Continue low-cholesterol diet. Hypertension, BP controlled, advised to take Lopressor  25 mg twice daily. Irregular heartbeat, likely from deconditioning.  Heart rates up to 90s at home.  Follow-up after cardiac testing  Informed Consent   Shared Decision Making/Informed Consent The risks [chest pain, shortness of breath, cardiac arrhythmias, dizziness, blood pressure fluctuations, myocardial infarction, stroke/transient ischemic attack, nausea, vomiting, allergic reaction, radiation exposure, metallic taste sensation and life-threatening complications (estimated to be 1 in 10,000)], benefits (risk stratification, diagnosing coronary artery disease, treatment guidance) and alternatives of a nuclear stress test were discussed in detail with Mariah Reed and she agrees to proceed.         Medication Adjustments/Labs and Tests Ordered: Current medicines are reviewed at length with the patient today.  Concerns regarding medicines are outlined above.   Orders Placed This Encounter  Procedures   NM Myocar Multi W/Spect W/Wall Motion / EF   EKG 12-Lead   ECHOCARDIOGRAM COMPLETE   No orders of the defined types were placed in this encounter.   Patient Instructions  Medication Instructions:  Your physician recommends that you continue on your current medications as directed. Please refer to the Current Medication list given to you today.   *If you need a refill on your cardiac medications before your next appointment, please call your pharmacy*  Lab Work: No labs ordered today   If you have labs (blood work) drawn today and your tests are completely normal, you will receive your results only by: MyChart Message (if you have MyChart) OR A paper copy in the mail If you have any lab test that is abnormal or we need to change your treatment, we will call you to review the results.  Testing/Procedures: Your physician has requested that you have an echocardiogram. Echocardiography is a painless test that uses sound waves to create images of your heart. It provides your doctor with information about the size and shape of your heart and how well your heart's chambers and valves are working.   You may receive an ultrasound enhancing agent through an IV if needed to better visualize your heart during the echo. This procedure takes approximately one hour.  There are no restrictions for this procedure.  This will take place at [address] [facility] [address], [place] [address]  Please note: We ask at that you not bring children with you during ultrasound (echo/ vascular) testing. Due to room size and safety concerns, children are not allowed in the ultrasound rooms during exams. Our front office staff cannot provide observation of children in our lobby area while testing is being conducted. An adult accompanying a patient to their appointment will only be allowed in the ultrasound room at the discretion of the ultrasound technician  under special circumstances. We apologize  for any inconvenience.   Your provider has ordered a Lexiscan / Exercise Myoview  Stress test. This will take place at [facility]. Please report to the [facility] medical mall entrance. The volunteers at the first desk will direct you where to go.  [facility] MYOVIEW   Your provider has ordered a Stress Test with nuclear imaging. The purpose of this test is to evaluate the blood supply to your heart muscle. This procedure is referred to as a Non-Invasive Stress Test. This is because other than having an IV started in your vein, nothing is inserted or invades your body. Cardiac stress tests are done to find areas of poor blood flow to the heart by determining the extent of coronary artery disease (CAD). Some patients exercise on a treadmill, which naturally increases the blood flow to your heart, while others who are unable to walk on a treadmill due to physical limitations will have a pharmacologic/chemical stress agent called Lexiscan  . This medicine will mimic walking on a treadmill by temporarily increasing your coronary blood flow.   Please note: these test may take anywhere between 2-4 hours to complete  How to prepare for your Myoview  test:  Nothing to eat for 6 hours prior to the test No caffeine for 24 hours prior to test No smoking 24 hours prior to test. Your medication may be taken with water.  If your doctor stopped a medication because of this test, do not take that medication. Ladies, please do not wear dresses.  Skirts or pants are appropriate. Please wear a short sleeve shirt. No perfume, cologne or lotion. Wear comfortable walking shoes. No heels!   PLEASE NOTIFY THE OFFICE AT LEAST 24 HOURS IN ADVANCE IF YOU ARE UNABLE TO KEEP YOUR APPOINTMENT.  [phone] AND  PLEASE NOTIFY NUCLEAR MEDICINE AT [facility] AT LEAST 24 HOURS IN ADVANCE IF YOU ARE UNABLE TO KEEP YOUR APPOINTMENT. [phone]   Follow-Up: At [facility], you and your health needs  are our priority.  As part of our continuing mission to provide you with exceptional heart care, our providers are all part of one team.  This team includes your primary Cardiologist (physician) and Advanced Practice Providers or APPs (Physician Assistants and Nurse Practitioners) who all work together to provide you with the care you need, when you need it.  Your next appointment:   2 month(s)  Provider:   You may see Constancia Delton, MD or one of the following Advanced Practice Providers on your designated Care Team:   Laneta Pintos, NP Gildardo Labrador, PA-C Varney Gentleman, PA-C Cadence [place], PA-C Ronald Cockayne, NP Morey Ar, NP    We recommend signing up for the patient portal called MyChart.  Sign up information is provided on this After Visit Summary.  MyChart is used to connect with patients for Virtual Visits (Telemedicine).  Patients are able to view lab/test results, encounter notes, upcoming appointments, etc.  Non-urgent messages can be sent to your provider as well.   To learn more about what you can do with MyChart, go to [email or web address].     Signed, Constancia Delton, MD  07/22/2023 12:18 PM    [facility]

## 2023-07-22 NOTE — Patient Instructions (Signed)
 Medication Instructions:  Your physician recommends that you continue on your current medications as directed. Please refer to the Current Medication list given to you today.   *If you need a refill on your cardiac medications before your next appointment, please call your pharmacy*  Lab Work: No labs ordered today   If you have labs (blood work) drawn today and your tests are completely normal, you will receive your results only by: MyChart Message (if you have MyChart) OR A paper copy in the mail If you have any lab test that is abnormal or we need to change your treatment, we will call you to review the results.  Testing/Procedures: Your physician has requested that you have an echocardiogram. Echocardiography is a painless test that uses sound waves to create images of your heart. It provides your doctor with information about the size and shape of your heart and how well your heart's chambers and valves are working.   You may receive an ultrasound enhancing agent through an IV if needed to better visualize your heart during the echo. This procedure takes approximately one hour.  There are no restrictions for this procedure.  This will take place at [address] [facility] [address], [place] [address]  Please note: We ask at that you not bring children with you during ultrasound (echo/ vascular) testing. Due to room size and safety concerns, children are not allowed in the ultrasound rooms during exams. Our front office staff cannot provide observation of children in our lobby area while testing is being conducted. An adult accompanying a patient to their appointment will only be allowed in the ultrasound room at the discretion of the ultrasound technician under special circumstances. We apologize for any inconvenience.   Your provider has ordered a Lexiscan / Exercise Myoview  Stress test. This will take place at [facility]. Please report to the [facility] medical mall entrance. The  volunteers at the first desk will direct you where to go.  [facility] MYOVIEW   Your provider has ordered a Stress Test with nuclear imaging. The purpose of this test is to evaluate the blood supply to your heart muscle. This procedure is referred to as a Non-Invasive Stress Test. This is because other than having an IV started in your vein, nothing is inserted or invades your body. Cardiac stress tests are done to find areas of poor blood flow to the heart by determining the extent of coronary artery disease (CAD). Some patients exercise on a treadmill, which naturally increases the blood flow to your heart, while others who are unable to walk on a treadmill due to physical limitations will have a pharmacologic/chemical stress agent called Lexiscan  . This medicine will mimic walking on a treadmill by temporarily increasing your coronary blood flow.   Please note: these test may take anywhere between 2-4 hours to complete  How to prepare for your Myoview  test:  Nothing to eat for 6 hours prior to the test No caffeine for 24 hours prior to test No smoking 24 hours prior to test. Your medication may be taken with water.  If your doctor stopped a medication because of this test, do not take that medication. Ladies, please do not wear dresses.  Skirts or pants are appropriate. Please wear a short sleeve shirt. No perfume, cologne or lotion. Wear comfortable walking shoes. No heels!   PLEASE NOTIFY THE OFFICE AT LEAST 24 HOURS IN ADVANCE IF YOU ARE UNABLE TO KEEP YOUR APPOINTMENT.  [phone] AND  PLEASE NOTIFY NUCLEAR MEDICINE  AT [facility] AT LEAST 24 HOURS IN ADVANCE IF YOU ARE UNABLE TO KEEP YOUR APPOINTMENT. [phone]   Follow-Up: At [facility], you and your health needs are our priority.  As part of our continuing mission to provide you with exceptional heart care, our providers are all part of one team.  This team includes your primary Cardiologist (physician) and Advanced Practice  Providers or APPs (Physician Assistants and Nurse Practitioners) who all work together to provide you with the care you need, when you need it.  Your next appointment:   2 month(s)  Provider:   You may see Constancia Delton, MD or one of the following Advanced Practice Providers on your designated Care Team:   Laneta Pintos, NP Gildardo Labrador, PA-C Varney Gentleman, PA-C Cadence Suissevale, PA-C Ronald Cockayne, NP Morey Ar, NP    We recommend signing up for the patient portal called MyChart.  Sign up information is provided on this After Visit Summary.  MyChart is used to connect with patients for Virtual Visits (Telemedicine).  Patients are able to view lab/test results, encounter notes, upcoming appointments, etc.  Non-urgent messages can be sent to your provider as well.   To learn more about what you can do with MyChart, go to [email or web address].

## 2023-07-30 ENCOUNTER — Other Ambulatory Visit

## 2023-07-31 ENCOUNTER — Other Ambulatory Visit: Payer: Self-pay | Admitting: Nurse Practitioner

## 2023-07-31 ENCOUNTER — Encounter: Payer: Self-pay | Admitting: Nurse Practitioner

## 2023-07-31 DIAGNOSIS — R140 Abdominal distension (gaseous): Secondary | ICD-10-CM

## 2023-07-31 DIAGNOSIS — R252 Cramp and spasm: Secondary | ICD-10-CM

## 2023-07-31 DIAGNOSIS — K5909 Other constipation: Secondary | ICD-10-CM

## 2023-07-31 DIAGNOSIS — R109 Unspecified abdominal pain: Secondary | ICD-10-CM

## 2023-08-06 ENCOUNTER — Ambulatory Visit: Attending: Cardiology

## 2023-08-06 DIAGNOSIS — R072 Precordial pain: Secondary | ICD-10-CM | POA: Diagnosis not present

## 2023-08-06 LAB — ECHOCARDIOGRAM COMPLETE
AR max vel: 2.38 cm2
AV Area VTI: 2.14 cm2
AV Area mean vel: 2.21 cm2
AV Mean grad: 4 mmHg
AV Peak grad: 8 mmHg
Ao pk vel: 1.41 m/s
Area-P 1/2: 2.87 cm2
Calc EF: 46.2 %
S' Lateral: 3 cm
Single Plane A2C EF: 55.7 %
Single Plane A4C EF: 37.4 %

## 2023-08-08 ENCOUNTER — Ambulatory Visit
Admission: RE | Admit: 2023-08-08 | Discharge: 2023-08-08 | Disposition: A | Discharge status: Home / Self Care | Source: Ambulatory Visit | Attending: Nurse Practitioner | Admitting: Nurse Practitioner

## 2023-08-08 DIAGNOSIS — K5909 Other constipation: Secondary | ICD-10-CM | POA: Diagnosis present

## 2023-08-08 DIAGNOSIS — R140 Abdominal distension (gaseous): Secondary | ICD-10-CM | POA: Diagnosis present

## 2023-08-08 DIAGNOSIS — R252 Cramp and spasm: Secondary | ICD-10-CM | POA: Diagnosis present

## 2023-08-08 DIAGNOSIS — R109 Unspecified abdominal pain: Secondary | ICD-10-CM | POA: Diagnosis present

## 2023-08-08 LAB — POCT I-STAT CREATININE: Creatinine, Ser: 1 mg/dL (ref 0.44–1.00)

## 2023-08-08 MED ORDER — IOHEXOL 300 MG/ML  SOLN
100.0000 mL | Freq: Once | INTRAMUSCULAR | Status: AC | PRN
  Administered 2023-08-08: 100 mL via INTRAVENOUS

## 2023-08-08 MED ORDER — IOHEXOL 300 MG/ML  SOLN: 100.0000 mL | Freq: Once | INTRAMUSCULAR | Status: DC | PRN

## 2023-08-12 ENCOUNTER — Ambulatory Visit: Payer: Self-pay | Admitting: Cardiology

## 2023-08-13 ENCOUNTER — Encounter: Admission: RE | Admit: 2023-08-13 | Source: Ambulatory Visit

## 2023-09-03 ENCOUNTER — Other Ambulatory Visit

## 2023-09-23 ENCOUNTER — Ambulatory Visit: Admitting: Cardiology

## 2023-11-05 ENCOUNTER — Ambulatory Visit: Admitting: Cardiology

## 2023-12-21 ENCOUNTER — Other Ambulatory Visit: Payer: Self-pay | Admitting: Nurse Practitioner

## 2024-02-26 ENCOUNTER — Other Ambulatory Visit: Payer: Self-pay | Admitting: Nurse Practitioner

## 2024-03-10 ENCOUNTER — Other Ambulatory Visit: Payer: Self-pay | Admitting: Nurse Practitioner

## 2024-03-10 DIAGNOSIS — M545 Low back pain, unspecified: Secondary | ICD-10-CM
